# Patient Record
Sex: Female | Born: 1935 | Race: White | Hispanic: No | Smoking: Never smoker
Health system: Southern US, Community
[De-identification: ages and names within clinical notes are randomized; demographics above are authoritative.]

## PROBLEM LIST (undated history)

## (undated) DIAGNOSIS — C801 Malignant (primary) neoplasm, unspecified: Secondary | ICD-10-CM

## (undated) DIAGNOSIS — N189 Chronic kidney disease, unspecified: Secondary | ICD-10-CM

## (undated) DIAGNOSIS — T7840XA Allergy, unspecified, initial encounter: Secondary | ICD-10-CM

## (undated) DIAGNOSIS — F0390 Unspecified dementia without behavioral disturbance: Secondary | ICD-10-CM

## (undated) DIAGNOSIS — H269 Unspecified cataract: Secondary | ICD-10-CM

## (undated) DIAGNOSIS — M1990 Unspecified osteoarthritis, unspecified site: Secondary | ICD-10-CM

## (undated) DIAGNOSIS — C7982 Secondary malignant neoplasm of genital organs: Secondary | ICD-10-CM

## (undated) DIAGNOSIS — R42 Dizziness and giddiness: Secondary | ICD-10-CM

## (undated) DIAGNOSIS — K625 Hemorrhage of anus and rectum: Secondary | ICD-10-CM

## (undated) DIAGNOSIS — I959 Hypotension, unspecified: Secondary | ICD-10-CM

## (undated) DIAGNOSIS — I739 Peripheral vascular disease, unspecified: Secondary | ICD-10-CM

## (undated) DIAGNOSIS — J189 Pneumonia, unspecified organism: Secondary | ICD-10-CM

## (undated) DIAGNOSIS — I509 Heart failure, unspecified: Secondary | ICD-10-CM

## (undated) DIAGNOSIS — I499 Cardiac arrhythmia, unspecified: Secondary | ICD-10-CM

## (undated) DIAGNOSIS — J302 Other seasonal allergic rhinitis: Secondary | ICD-10-CM

## (undated) DIAGNOSIS — R0600 Dyspnea, unspecified: Secondary | ICD-10-CM

## (undated) DIAGNOSIS — N390 Urinary tract infection, site not specified: Secondary | ICD-10-CM

## (undated) DIAGNOSIS — F419 Anxiety disorder, unspecified: Secondary | ICD-10-CM

## (undated) DIAGNOSIS — I10 Essential (primary) hypertension: Secondary | ICD-10-CM

## (undated) DIAGNOSIS — Z923 Personal history of irradiation: Secondary | ICD-10-CM

## (undated) DIAGNOSIS — J449 Chronic obstructive pulmonary disease, unspecified: Secondary | ICD-10-CM

## (undated) HISTORY — DX: Anxiety disorder, unspecified: F41.9

## (undated) HISTORY — PX: APPENDECTOMY: SHX54

## (undated) HISTORY — PX: TONSILLECTOMY: SUR1361

## (undated) HISTORY — PX: ABDOMINAL HYSTERECTOMY: SHX81

## (undated) HISTORY — PX: NECK SURGERY: SHX720

## (undated) HISTORY — DX: Allergy, unspecified, initial encounter: T78.40XA

## (undated) HISTORY — PX: EYE SURGERY: SHX253

## (undated) HISTORY — DX: Unspecified cataract: H26.9

## (undated) HISTORY — PX: NASAL SEPTUM SURGERY: SHX37

## (undated) HISTORY — DX: Malignant (primary) neoplasm, unspecified: C80.1

---

## 1997-11-23 ENCOUNTER — Other Ambulatory Visit: Admission: RE | Admit: 1997-11-23 | Discharge: 1997-11-23 | Payer: Self-pay | Admitting: Family Medicine

## 2001-03-30 ENCOUNTER — Other Ambulatory Visit: Admission: RE | Admit: 2001-03-30 | Discharge: 2001-03-30 | Payer: Self-pay | Admitting: Family Medicine

## 2001-06-18 ENCOUNTER — Encounter (INDEPENDENT_AMBULATORY_CARE_PROVIDER_SITE_OTHER): Payer: Self-pay

## 2001-06-18 ENCOUNTER — Ambulatory Visit (HOSPITAL_COMMUNITY): Admission: RE | Admit: 2001-06-18 | Discharge: 2001-06-18 | Payer: Self-pay | Admitting: *Deleted

## 2007-10-27 ENCOUNTER — Encounter: Admission: RE | Admit: 2007-10-27 | Discharge: 2007-10-27 | Payer: Self-pay | Admitting: Internal Medicine

## 2007-11-24 ENCOUNTER — Ambulatory Visit: Admission: RE | Admit: 2007-11-24 | Discharge: 2007-11-24 | Payer: Self-pay | Admitting: Gynecologic Oncology

## 2007-12-02 ENCOUNTER — Ambulatory Visit (HOSPITAL_COMMUNITY): Admission: RE | Admit: 2007-12-02 | Discharge: 2007-12-02 | Payer: Self-pay | Admitting: Urology

## 2007-12-21 ENCOUNTER — Inpatient Hospital Stay (HOSPITAL_COMMUNITY): Admission: RE | Admit: 2007-12-21 | Discharge: 2007-12-24 | Payer: Self-pay | Admitting: Obstetrics & Gynecology

## 2007-12-21 ENCOUNTER — Encounter: Payer: Self-pay | Admitting: Gynecology

## 2008-02-04 ENCOUNTER — Ambulatory Visit: Admission: RE | Admit: 2008-02-04 | Discharge: 2008-02-04 | Payer: Self-pay | Admitting: Gynecology

## 2008-02-15 ENCOUNTER — Ambulatory Visit: Admission: RE | Admit: 2008-02-15 | Discharge: 2008-04-22 | Payer: Self-pay | Admitting: Radiation Oncology

## 2008-06-22 ENCOUNTER — Ambulatory Visit (HOSPITAL_COMMUNITY): Admission: RE | Admit: 2008-06-22 | Discharge: 2008-06-22 | Payer: Self-pay | Admitting: Urology

## 2008-07-21 ENCOUNTER — Ambulatory Visit: Admission: RE | Admit: 2008-07-21 | Discharge: 2008-07-21 | Payer: Self-pay | Admitting: Gynecology

## 2008-07-21 ENCOUNTER — Encounter: Payer: Self-pay | Admitting: Gynecology

## 2008-07-21 ENCOUNTER — Other Ambulatory Visit: Admission: RE | Admit: 2008-07-21 | Discharge: 2008-07-21 | Payer: Self-pay | Admitting: Gynecology

## 2008-09-27 ENCOUNTER — Ambulatory Visit: Admission: RE | Admit: 2008-09-27 | Discharge: 2008-09-27 | Payer: Self-pay | Admitting: Radiation Oncology

## 2008-09-27 ENCOUNTER — Other Ambulatory Visit: Admission: RE | Admit: 2008-09-27 | Discharge: 2008-09-27 | Payer: Self-pay | Admitting: Radiation Oncology

## 2008-09-27 ENCOUNTER — Encounter: Payer: Self-pay | Admitting: Radiation Oncology

## 2009-01-05 ENCOUNTER — Ambulatory Visit: Admission: RE | Admit: 2009-01-05 | Discharge: 2009-01-05 | Payer: Self-pay | Admitting: Gynecology

## 2009-01-05 ENCOUNTER — Encounter: Payer: Self-pay | Admitting: Gynecology

## 2009-01-05 ENCOUNTER — Other Ambulatory Visit: Admission: RE | Admit: 2009-01-05 | Discharge: 2009-01-05 | Payer: Self-pay | Admitting: Gynecology

## 2009-04-24 ENCOUNTER — Other Ambulatory Visit: Admission: RE | Admit: 2009-04-24 | Discharge: 2009-04-24 | Payer: Self-pay | Admitting: Radiation Oncology

## 2009-04-24 ENCOUNTER — Ambulatory Visit: Admission: RE | Admit: 2009-04-24 | Discharge: 2009-04-24 | Payer: Self-pay | Admitting: Radiation Oncology

## 2009-04-25 ENCOUNTER — Other Ambulatory Visit: Admission: RE | Admit: 2009-04-25 | Discharge: 2009-04-25 | Payer: Self-pay | Admitting: Radiology

## 2009-06-25 ENCOUNTER — Ambulatory Visit (HOSPITAL_COMMUNITY): Admission: RE | Admit: 2009-06-25 | Discharge: 2009-06-25 | Payer: Self-pay | Admitting: Urology

## 2009-08-22 ENCOUNTER — Ambulatory Visit: Admission: RE | Admit: 2009-08-22 | Discharge: 2009-08-22 | Payer: Self-pay | Admitting: Gynecology

## 2009-08-22 ENCOUNTER — Other Ambulatory Visit: Admission: RE | Admit: 2009-08-22 | Discharge: 2009-08-22 | Payer: Self-pay | Admitting: Gynecology

## 2010-01-14 ENCOUNTER — Ambulatory Visit: Admission: RE | Admit: 2010-01-14 | Discharge: 2010-01-14 | Payer: Self-pay | Admitting: Radiation Oncology

## 2010-01-14 ENCOUNTER — Other Ambulatory Visit: Admission: RE | Admit: 2010-01-14 | Discharge: 2010-01-14 | Payer: Self-pay | Admitting: Gynecology

## 2010-06-21 ENCOUNTER — Ambulatory Visit
Admission: RE | Admit: 2010-06-21 | Discharge: 2010-06-21 | Payer: Self-pay | Source: Home / Self Care | Attending: Gynecology | Admitting: Gynecology

## 2010-06-21 ENCOUNTER — Other Ambulatory Visit
Admission: RE | Admit: 2010-06-21 | Discharge: 2010-06-21 | Payer: Self-pay | Source: Home / Self Care | Admitting: Gynecology

## 2010-06-30 ENCOUNTER — Encounter: Payer: Self-pay | Admitting: Urology

## 2010-10-22 NOTE — Consult Note (Signed)
Debbie [name], Debbie [name]             ACCOUNT NO.:  [id number]   MEDICAL RECORD NO.:  [id number]          PATIENT TYPE:  OUT   LOCATION:  GYN                          FACILITY:  [facility]   PHYSICIAN:  Paola A. Duard Brady, MD    DATE OF BIRTH:  08/05/1930   DATE OF CONSULTATION:  11/24/2007  DATE OF DISCHARGE:                                 CONSULTATION   REFERRING PHYSICIAN:  Osborn Coho, M.D.   The patient is seen today in consultation at the request of Dr. Su Hilt.  Ms. Lecker is a 77 year old gravida 2, para 2 who has been spotting  starting a few months ago.  She had a kidney infection and was seen by  Dr. Brunilda Payor as she thought the bleeding was coming from her bladder or  kidney.  He saw her and felt that she had vaginal bleeding and asked her  to see her gynecologist.  She saw Dr. Su Hilt on May 29.  Endometrial  biopsy was done in the office at that time which revealed a grade 2  endometrioid adenocarcinoma.  She subsequently was referred to [place].  Since the biopsy she has had no bleeding.  There was no associated pain  with this.   REVIEW OF SYSTEMS:  She denies any chest pain, shortness of breath,  nausea, vomiting, fever or chills, headache or visual changes.  She  walks a mile per day.  She is able to easily walk a flight of stairs.   PAST MEDICAL HISTORY:  Seasonal allergies, diabetes, hypertension.   MEDICATIONS:  1. Metformin 1000 mg twice daily.  2. Glimepiride 4 mg daily.  3. Vytorin 10/80 one daily.  4. Lexapro 10 mg daily.  5. Lisinopril 20/25 one daily.   ALLERGIES:  CODEINE which causes her mouth to feel funny.   PAST SURGICAL HISTORY:  Appendectomy at the age of 77, tonsillectomy at  the age of 77.  She had a deviated septum repair.  She has had two  spontaneous vaginal deliveries.   SOCIAL HISTORY:  She denies use of tobacco or alcohol.  She is married.   FAMILY HISTORY:  Her sister had lung cancer.  She was a smoker.  She  died of a deep venous thrombosis.   Her mother had either endometrial or  cervical cancer.  She underwent surgery and did not require any adjuvant  therapy; that was many years ago and she was treated at [facility].  Her father had congestive heart failure.  She has two boys who are alive  and well.   HEALTH MAINTENANCE:  She had a mammogram in October of 2008 that was  negative.  She had a colonoscopy 5 years ago that was negative.   PHYSICAL EXAMINATION:  VITAL SIGNS:  Weight 204 pounds, height 5 feet 5  inches, blood pressure 116/68.  GENERAL:  A well-nourished, well-developed female in no acute distress.  NECK:  Supple.  There is no lymphadenopathy, no thyromegaly.  LUNGS:  Lungs were clear to auscultation bilaterally.  CARDIOVASCULAR:  Regular rate and rhythm.  ABDOMEN:  Abdomen is morbidly obese, soft, nontender, nondistended.  There are  no palpable masses or hepatosplenomegaly although exam is  limited by habitus.  Groins are negative for adenopathy.  EXTREMITIES:  No edema.  PELVIC:  External genitalia is within normal limits.  The vagina is  atrophic.  The cervix is visualized.  It is multiparous.  There is a  physiologic discharge.  There are no visible lesions.  Bimanual  examination - cervix is palpably normal.  The corpus does not appear  significantly enlarged.  It is not globular.  It is midplane and mobile.  RECTAL:  Confirms.   ASSESSMENT:  A 77 year old with a grade 2 endometrioid adenocarcinoma.  I had a lengthy discussion that she really is on the cusp of what we can  try laparoscopically.  I do think it is reasonable to start  laparoscopically.  She is a diabetic with hypertension and I do think  she would benefit from a minimally invasive procedure.  She does have a  small uterus measuring 8 x 5 x 4 cm on ultrasound with normal adnexa and  capacious vagina.  While she has centripetal obesity it is well  distributed and she is a nonsmoker.  She understands that there is a  significant risk of  her undergoing a laparotomy and she is amenable to  that.  We are tentatively scheduling her for surgery here in [place]  on July 14 and we will attempt to schedule it with Dr. Su Hilt.  I am  not here that day.  Dr. Kyla Balzarine my partner is and she understands that he  is adept at laparoscopic surgery and if he could not attempt it  laparoscopically he will proceed with a midline vertical incision at  that time.  She was offered at the opportunity for [place] to look at earlier  dates at [facility] but she states she preferred to keep the surgery  date here in [place] and do not look for dates in [place].  Risks  of surgery including but not limited to need for laparotomy, bleeding,  injury to surrounding organs, infection, thromboembolic disease,  pneumonia, lymphedema were discussed with the patient.  She understands  and wishes to proceed with surgery.  She is seeing Dr. Burton Apley,  her primary physician, next week to have some benign skin lesions  removed from her face and she will address that she is having surgery  with him.  I do not believe she needs any perioperative risk clearance  at this point based on her exercise tolerance but will defer to Dr.  Budd Palmer opinion.      Paola A. Duard Brady, MD  Electronically Signed     PAG/MEDQ  D:  11/24/2007  T:  11/24/2007  Job:  [id number]   cc:   Lindaann Slough, M.D.  Fax: [phone]   Osborn Coho, M.D.  Fax: [phone]   Antony Madura, M.D.  Fax: [phone]   Telford Nab, R.N.  [address]  [place], [place] [address]

## 2010-10-22 NOTE — Op Note (Signed)
Debbie [name], Debbie [name]             ACCOUNT NO.:  [id number]   MEDICAL RECORD NO.:  [id number]          PATIENT TYPE:  INP   LOCATION:  0005                         FACILITY:  [facility]   PHYSICIAN:  De Blanch, M.D.DATE OF BIRTH:  08/27/1930   DATE OF PROCEDURE:  12/21/2007  DATE OF DISCHARGE:                               OPERATIVE REPORT   PREOPERATIVE DIAGNOSES:  Grade II endometrial carcinoma.   POSTOPERATIVE DIAGNOSES:  Grade II endometrial carcinoma, right ovarian  serous cystadenoma.   PROCEDURE:  exploratory laparotomy, total abdominal hysterectomy,  bilateral salpingo-oophorectomy, pelvic and periaortic lymphadenectomy.   SURGEON:  De Blanch, M.D.   ASSISTANT:  Roseanna Rainbow, M.D.  Telford Nab, R.N.   ANESTHESIA:  general endotracheal anesthesia.   ESTIMATED BLOOD LOSS:  500 mL.   SURGICAL FINDINGS:  At the time of exploratory laparotomy, the patient  had a smooth walled 3 to 4 cm cyst arising from the right ovary.  On  frozen section this was benign.  The uterus, tubes and ovaries were  otherwise normal.  The patient had a considerable amount of fat in the  retroperitoneal space.  No lymph nodes were obviously suspicious.  By  frozen section the uterus had invasion to the middle third of the  myometrium and evidence of lymph vascular space invasion deeper in the  myometrium.  There were a few omental adhesions in the pelvis.  The  upper abdomen including liver, diaphragms and omentum were normal.   DESCRIPTION OF PROCEDURE:  The patient was brought to the operating room  and after satisfactory obtainment of general anesthesia, was placed in a  modified lithotomy position in [place] stirrups.  The anterior abdominal  wall, perineum and vagina were prepped with Betadine.  Foley catheter  was inserted, the patient was draped and the abdomen was entered through  a midline incision.  Peritoneal washing were obtained from the pelvis  and  upper abdomen.  The pelvis was explored with the above noted  findings.  Bookwalter retractor was assembled and the small bowel packed  out of the pelvis.  The uterus was grasped with Tresa Endo clamps.  The round  ligaments were divided with cautery and the retroperitoneal space was  opened, developing the perirectal and perivesical spaces.  The ureter  and pelvic side wall vessels were identified.  The ovarian vessels were  skeletonized, clamped, cut, free tied and suture ligated with 2-0 Vicryl  suture.  The right adnexa was removed and sent for frozen section with  the above noted findings.  Bladder flap was advanced through sharp and  blunt dissection.  The uterine vessels were skeletonized, clamped, cut  and suture ligated in a stepwise fashion.  Cervical and cardinal  ligaments were clamped, cut and suture ligated.  Vaginal angles were  cross clamped and the vagina transected at its junction with the cervix.  Vaginal angles were transfixed with 0 Vicryl.  The central portion of  the vagina closed with interrupted figure-of-eight sutures of 0 Vicryl.   Attention was turned to performing pelvic lymphadenectomy.  This was  somewhat complicated by the patient's extreme obesity, especially  excessive amount of fat in the retroperitoneal space.  None the less,  external iliac artery and vein were stripped of its lymph node bearing  tissue.  The dissection continued down into the obturator fossa where  the obturator artery and vein were identified along with the obturator  nerve.  The lymph nodes from the obturator fossa were removed.  Throughout the dissection hemostasis was achieved with cautery and  hemoclips.  Similar procedure was performed on both sides of the pelvis.   The retractor was repositioned and the upper abdomen explored.  The  midline incision was extended so as to expose the aorta.  The small  bowel was packed to the right.  Peritoneal incision was made overlying  the right  common iliac artery and along the aorta.  Dissection was  carried beneath the retroperitoneal portion of the duodenum which was  mobilized cephalad.  The right ureter was mobilized laterally and held  behind the retractor for protection.  After exposure was gained, the  lymph nodes overlying the right common iliac artery and vein, vena cava  and aorta were removed up to the level of the insertion of the ovarian  artery and vein on the right.  Hemostasis was again achieved with  hemoclips and cautery.  This area was inspected and found to be  hemostatic.  The pelvis was re-exposed and explored.  Hemostasis was  achieved with cautery and an additional suture in the vaginal cuff.  The  pelvis was irrigated with saline.  Packs and retractors were removed.  The omentum was brought down to cover the small bowel.  The anterior  abdominal wall was closed in layers, the first being a running mass  closure using #1 PDS.  On the lower portion of the pole Seprafilm was  placed to minimize adhesions to the lower abdominal incision.  In  addition, prior to closure Seprafilm was placed across the raw pelvic  peritoneum.  Subcutaneous tissue was irrigated.  Hemostasis was achieved  with cautery.  The skin was closed with staples.  A dressing was  applied.  The patient was awakened from anesthesia and taken to the  recovery room in satisfactory condition.  Sponge, needle and instrument  counts correct x2.      De Blanch, M.D.  Electronically Signed     DC/MEDQ  D:  12/21/2007  T:  12/21/2007  Job:  [id number]   cc:   Telford Nab, R.N.  [address]  [place], [place] [address]   Osborn Coho, M.D.  Fax: [phone]   Antony Madura, M.D.  Fax: [phone]   Lindaann Slough, M.D.  Fax: [phone]   Roseanna Rainbow, M.D.  Fax: [phone]

## 2010-10-22 NOTE — Consult Note (Signed)
Debbie [name], Debbie [name]             ACCOUNT NO.:  [id number]   MEDICAL RECORD NO.:  [id number]          PATIENT TYPE:  OUT   LOCATION:  GYN                          FACILITY:  [facility]   PHYSICIAN:  De Blanch, M.D.DATE OF BIRTH:  Mar 13, 1931   DATE OF CONSULTATION:  01/05/2009  DATE OF DISCHARGE:  01/05/2009                                 CONSULTATION   CHIEF COMPLAINT:  Endometrial cancer.   INTERVAL HISTORY:  The patient returns today for continuing followup of  a stage IB grade 3 adenocarcinoma of the endometrium.  Since her last  visit she has done well.  She denies any GI or GU symptoms, has no  pelvic pain, pressure, vaginal bleeding, or discharge.   HISTORY OF PRESENT ILLNESS:  On December 21, 2007, the patient underwent  total abdominal hysterectomy, bilateral salpingo-oophorectomy, pelvic  and periaortic lymphadenectomy for a poorly differentiated endometrial  carcinoma, which was found to be confined to the inner half of the  myometrium (stage IB).  She received postoperative high-dose rate  brachytherapy to the vaginal vault. She has had no postoperative or post-  radiation complications.   PAST MEDICAL HISTORY:  1. Hypertension.  2. Diabetes.  3. Obesity.  4. Seasonal allergies.   CURRENT MEDICATIONS:  Metformin, glyburide, Vytorin, Lexapro, and  lisinopril.   PAST SURGICAL HISTORY:  1. TAH-BSO, pelvic and periaortic lymphadenectomy in 2009.  2. Appendectomy at age 77.  3. Tonsils and adenoidectomy at age 77.  4. Repair of deviated nasal septum.   OBSTETRICAL HISTORY:  Gravida 2.   SOCIAL HISTORY:  The patient does not smoke.   FAMILY HISTORY:  Mother with endometrial and cervical cancer.   REVIEW OF SYSTEMS:  1 10-point comprehensive review of systems is  negative except as noted above.   PHYSICAL EXAMINATION:  VITAL SIGNS:  Weight 197 pounds, blood pressure  134/78.  GENERAL:  The patient is a healthy white female in no acute distress.  HEENT:   Negative.  NECK:  Supple without thyromegaly.  There is no supraclavicular or  inguinal adenopathy.  ABDOMEN:  Obese, soft, nontender.  No mass, organomegaly, ascites, or  hernias are noted.  PELVIC EXAM:  EG, BUS, vagina, bladder, and urethra are normal.  Cervix  and uterus surgically absent.  Adnexa without masses.  Rectovaginal exam  confirms.  LOWER EXTREMITIES:  Without edema or varicosities.   IMPRESSION:  Stage IB grade 3 endometrial cancer.  No evidence of  recurrent disease.   PLAN:  Pap smears were obtained.  The patient will obtain mammograms in  the near future.  She will follow up with Dr. Roselind Messier in 4 months and  return to see [place] in 8 months.      De Blanch, M.D.  Electronically Signed     DC/MEDQ  D:  01/12/2009  T:  01/12/2009  Job:  [id number]   cc:   Telford Nab, R.N.  [address]  [place], [place] [address]   Billie Lade, M.D.  Fax: [phone]   Osborn Coho, M.D.  Fax: [phone]   Lindaann Slough, M.D.  Fax: [phone]

## 2010-10-22 NOTE — Consult Note (Signed)
[name], [name]             ACCOUNT NO.:  [id number]   MEDICAL RECORD NO.:  [id number]          PATIENT TYPE:  OUT   LOCATION:  GYN                          FACILITY:  [facility]   PHYSICIAN:  De Blanch, M.D.DATE OF BIRTH:  1931-03-27   DATE OF CONSULTATION:  DATE OF DISCHARGE:                                 CONSULTATION   CHIEF COMPLAINT:  Endometrial cancer.   INTERVAL HISTORY:  The patient turns today for continuing follow-up.  She was last seen by Dr. Roselind Messier in November.  She was treated with  postoperative vaginal vault, high-dose rate brachytherapy in October.  Since then she has done well.  She denies any vaginal bleeding,  discharge, any pelvic pain, and has no urinary tract symptoms.   HISTORY OF PRESENT ILLNESS:  The patient underwent total abdominal  hysterectomy, bilateral salpingo-oophorectomy, and pelvic and  periaortic lymphadenectomy December 21, 2007.  Final pathology showed a  poorly differentiated endometrial carcinoma confined to the inner half  of the myometrium.  She was treated postoperatively with a high-dose  rate brachytherapy with the vaginal vault.   PAST MEDICAL HISTORY:  1. Diabetes.  2. Hypertension.  3. Obesity.  4. Seasonal allergies.   CURRENT MEDICATIONS:  Metformin, glyburide, Vytorin, Lexapro, and  lisinopril.   PAST SURGICAL HISTORY:  1. TAH-BSO.  2. Pelvic and periaortic lymphadenectomy 2009.  3. Appendectomy at age 48.  4. Tonsil and adenoidectomy at age 56.  5. Repair of deviated nasal septum.   OBSTETRICAL HISTORY:  Gravida 2.   SOCIAL HISTORY:  The patient does not smoke.   FAMILY HISTORY:  The patient's mother had endometrial or cervical  cancer.  No other gynecologic breast or colon cancers noted in the  family history.   The patient has annual mammograms ordered by her primary physician.   REVIEW OF SYSTEMS:  A 10-point comprehensive review of systems negative  except as noted above.   PHYSICAL EXAMINATION:   Weight of 195 pounds, blood pressure 150/76,  pulse 88.  GENERAL:  The patient is a healthy white female in no acute distress.  HEENT:  Negative.  NECK:  Supple without thyromegaly.  There is no supraclavicular or  inguinal adenopathy.  ABDOMEN:  Soft, nontender.  No mass, organomegaly, ascites or hernias  are noted.  Midline incision is well-healed.  PELVIC:  EG/BUS, vagina, bladder, urethra are normal.  Vaginal vault is  healthy.  No lesions are noted.  Pap smears were obtained.  Bimanual  rectovaginal exam revealed no mass, induration or nodularity.   IMPRESSION:  Stage IB, grade III endometrial carcinoma clinically free  of disease.  Pap smears were obtained.  The patient will return to see  Dr. Roselind Messier in 3 months, and return to see me 3 months thereafter.      De Blanch, M.D.  Electronically Signed     DC/MEDQ  D:  07/21/2008  T:  07/21/2008  Job:  [id number]   cc:   Telford Nab, R.N.  [address]  [place], [place] [address]   Billie Lade, M.D.  Fax: [phone]   Osborn Coho, M.D.  Fax:  [phone] 

## 2010-10-22 NOTE — Consult Note (Signed)
NAMEJACKLYN, Debbie [name]             ACCOUNT NO.:  [id number]   MEDICAL RECORD NO.:  [id number]          PATIENT TYPE:  OUT   LOCATION:  GYN                          FACILITY:  [facility]   PHYSICIAN:  De Blanch, M.D.DATE OF BIRTH:  12-Nov-1930   DATE OF CONSULTATION:  DATE OF DISCHARGE:                                 CONSULTATION   CHIEF COMPLAINT:  Endometrial cancer, postoperative follow-up, treatment  planning.   INTERVAL HISTORY:  The patient underwent TAH-BSO, pelvic and periaortic  lymphadenectomy on July 14. She had an uncomplicated postoperative  course.  She reports that she is doing well.  She denies any GI or GU  symptoms.  She has no significant abdominal pain.  Her functional status  has nearly completely returned to normal.   PHYSICAL EXAMINATION:  VITAL SIGNS:  weight 195 pounds.  GENERAL:  Patient is a healthy white female in no acute distress.  HEENT is negative.  NECK:  Supple without thyromegaly.  There is no supraclavicular or  inguinal adenopathy.  ABDOMEN:  Abdomen is obese, soft, nontender.  No mass, organomegaly,  ascites or hernias noted.  Midline incision is well-healed.  PELVIC  EXAM:  EG BUS, vagina, urethra are normal.  Cervix and uterus surgically  absent.  Adnexa without masses.  Rectovaginal exam confirms.  LOWER EXTREMITIES:  Without edema or varicosities.   IMPRESSION:  The patient's pathology is reviewed.  She has a stage IB  grade 3 endometrial carcinoma, 16 pelvic and periaortic lymph nodes are  negative as are pelvic washings.  I discussed his pathology report with  the patient, and we have recommend she undergo postoperative vaginal  vault brachytherapy. We will refer her to Dr. Roselind Messier.   She will return to see me in 3 months for continuing followup.  She is  given date of return to full levels of activity.      De Blanch, M.D.  Electronically Signed     DC/MEDQ  D:  02/04/2008  T:  02/05/2008  Job:  [id number]   cc:   Telford Nab, R.N.  [address]  [place], [place] [address]   Lindaann Slough, M.D.  Fax: [phone]   Osborn Coho, M.D.  Fax: [phone]   Antony Madura, M.D.  Fax: [phone]

## 2010-10-25 NOTE — Discharge Summary (Signed)
[name], Debbie [name]             ACCOUNT NO.:  [id number]   MEDICAL RECORD NO.:  [id number]          PATIENT TYPE:  INP   LOCATION:  1531                         FACILITY:  [facility]   PHYSICIAN:  Roseanna Rainbow, M.D.DATE OF BIRTH:  [birth year]   DATE OF ADMISSION:  12/21/2007  DATE OF DISCHARGE:  12/24/2007                               DISCHARGE SUMMARY   CHIEF COMPLAINTS:  The patient is a 77 year old with a newly diagnosed  grade II endometrioid adenocarcinoma of the uterus who presents for  surgical management.  Please see the dictated history and physical for  further details.   HOSPITAL COURSE:  The patient was admitted and underwent a total  abdominal hysterectomy, bilateral salpingo-oophorectomy, pelvic and  periaortic lymphadenectomy.  Please see the dictated operative summary.  On postoperative day one her H&H were stable.  She was doing well.  Her  diabetic medications were restarted.  She was started on a clear-liquid  diet.  Her diet was gradually advanced.  The remainder of her hospital  course was uneventful.  She was discharged to home on postoperative day  three, tolerating a regular diet.   DISCHARGE DIAGNOSIS:  Stage IB endometrioid carcinoma, FIGO grade 3.   PROCEDURES:  Total abdominal hysterectomy, bilateral salpingo-  oophorectomy, pelvic and periaortic lymphadenectomy.   CONDITION:  Good.   DIET:  ADA diet.   MEDICATIONS:  Resume preoperative medications, Percocet.   DISPOSITION:  The patient was to follow up on July 22 between 10:00 a.m.  and 12 noon at the cancer center.      Roseanna Rainbow, M.D.  Electronically Signed     LAJ/MEDQ  D:  01/11/2008  T:  01/11/2008  Job:  [id number]   cc:   Lindaann Slough, M.D.  Fax: [phone]   Osborn Coho, M.D.  Fax: [phone]   Antony Madura, M.D.  Fax: [phone]   Telford Nab, R.N.  [address]  [place], [place] [address]

## 2011-01-07 ENCOUNTER — Other Ambulatory Visit (HOSPITAL_COMMUNITY)
Admission: RE | Admit: 2011-01-07 | Discharge: 2011-01-07 | Disposition: A | Discharge status: Home / Self Care | Payer: PRIVATE HEALTH INSURANCE | Source: Ambulatory Visit | Attending: Radiation Oncology | Admitting: Radiation Oncology

## 2011-01-07 ENCOUNTER — Other Ambulatory Visit: Payer: Self-pay | Admitting: Radiation Oncology

## 2011-01-07 ENCOUNTER — Ambulatory Visit
Admission: RE | Admit: 2011-01-07 | Discharge: 2011-01-07 | Disposition: A | Discharge status: Home / Self Care | Payer: PRIVATE HEALTH INSURANCE | Source: Ambulatory Visit | Attending: Radiation Oncology | Admitting: Radiation Oncology

## 2011-01-07 DIAGNOSIS — C549 Malignant neoplasm of corpus uteri, unspecified: Secondary | ICD-10-CM | POA: Insufficient documentation

## 2011-03-06 LAB — COMPREHENSIVE METABOLIC PANEL
AST: 43 — ABNORMAL HIGH
BUN: 26 — ABNORMAL HIGH
GFR calc Af Amer: >60
Glucose, Bld: 161 — ABNORMAL HIGH
Potassium: 5
Sodium: 141
Total Bilirubin: 0.9

## 2011-03-06 LAB — DIFFERENTIAL
Basophils Absolute: 0
Basophils Relative: 0
Eosinophils Absolute: 0.2
Eosinophils Relative: 1
Lymphocytes Relative: 18
Monocytes Absolute: 0.7
Monocytes Relative: 6
Neutro Abs: 8.2 — ABNORMAL HIGH
Neutrophils Relative %: 74

## 2011-03-06 LAB — CBC
HCT: 37.7
Hemoglobin: 12.7
Platelets: 330
RBC: 4.17

## 2011-03-07 LAB — GLUCOSE, CAPILLARY
Glucose-Capillary: 156 — ABNORMAL HIGH
Glucose-Capillary: 169 — ABNORMAL HIGH
Glucose-Capillary: 42 — ABNORMAL LOW
Glucose-Capillary: 83
Glucose-Capillary: 91

## 2011-03-07 LAB — BASIC METABOLIC PANEL
Calcium: 8.5
Creatinine, Ser: 1
Glucose, Bld: 204 — ABNORMAL HIGH
Sodium: 138

## 2011-03-07 LAB — CBC
HCT: 32.1 — ABNORMAL LOW
Hemoglobin: 11 — ABNORMAL LOW
MCV: 90.7
Platelets: 349
RBC: 3.53 — ABNORMAL LOW
RDW: 14.6
WBC: 9.7

## 2011-03-07 LAB — HEMOGLOBIN AND HEMATOCRIT, BLOOD
HCT: 33.4 — ABNORMAL LOW
Hemoglobin: 11.1 — ABNORMAL LOW

## 2011-07-07 ENCOUNTER — Encounter: Payer: Self-pay | Admitting: Radiation Oncology

## 2011-07-07 ENCOUNTER — Other Ambulatory Visit (HOSPITAL_COMMUNITY)
Admission: RE | Admit: 2011-07-07 | Discharge: 2011-07-07 | Disposition: A | Discharge status: Home / Self Care | Payer: PRIVATE HEALTH INSURANCE | Source: Ambulatory Visit | Attending: Radiation Oncology | Admitting: Radiation Oncology

## 2011-07-07 ENCOUNTER — Ambulatory Visit
Admission: RE | Admit: 2011-07-07 | Discharge: 2011-07-07 | Disposition: A | Discharge status: Home / Self Care | Payer: Medicare Other | Source: Ambulatory Visit | Attending: Radiation Oncology | Admitting: Radiation Oncology

## 2011-07-07 DIAGNOSIS — C549 Malignant neoplasm of corpus uteri, unspecified: Secondary | ICD-10-CM | POA: Clinically undetermined

## 2011-07-07 DIAGNOSIS — Z01419 Encounter for gynecological examination (general) (routine) without abnormal findings: Secondary | ICD-10-CM | POA: Insufficient documentation

## 2011-07-07 NOTE — Progress Notes (Signed)
Here for follow up post radiation endometroid ca. Denies bowel and bladder problems. Mammogram this month was negative. I will call patient at home to get correct medications and dosages.

## 2011-07-07 NOTE — Progress Notes (Signed)
CC:   De Blanch, M.D. Antony Madura, M.D.  DIAGNOSIS:  Stage I-B grade 3 endometrioid adenocarcinoma.  INTERVAL SINCE RADIATION THERAPY:  3 years and 3 months.  NARRATIVE:  Debbie [name] comes in today for routine followup.  She clinically seems to be doing well.  The patient denies any new medical problems or new medications since last visit.  She denies any vaginal bleeding, urination difficulties or bowel complaints.  The patient denies any rectal bleeding.  The patient denies any pelvic or flank pain.  PHYSICAL EXAMINATION:  The patient's temperature is 98.4, pulse 59, blood pressure 143/77, weight is 202.4 pounds.  Examination of the neck and supraclavicular region reveals no evidence of adenopathy.  The axillary areas are free of adenopathy.  Examination of the lungs reveals them to be clear.  The heart has a regular rhythm and rate.  Examination of the abdomen reveals it to be soft and nontender with normal bowel sounds.  The inguinal areas are free of adenopathy.  On pelvic examination, the external genitalia are unremarkable.  A speculum exam is performed.  There are mild radiation changes noted at the vaginal cuff.  Pap smear is obtained of the proximal vagina.  On bimanual and rectovaginal examination, there are no pelvic masses appreciated.  IMPRESSION/PLAN:  Clinically NED (no evidence of disease), Pap smear pending.  The patient will return for followup in 6 months.    ______________________________ Debbie [name], Ph.D., M.D. JDK/MEDQ  D:  07/07/2011  T:  07/07/2011  Job:  [id number]

## 2011-07-10 ENCOUNTER — Telehealth: Payer: Self-pay

## 2011-07-10 NOTE — Telephone Encounter (Signed)
Pt. returned call for pap smear results. Informed negative for intraepithelial lesions/malignancy. Pt. Also read current med bottle prescriptions to me which were entered under medications.

## 2011-12-08 ENCOUNTER — Ambulatory Visit
Admission: RE | Admit: 2011-12-08 | Discharge: 2011-12-08 | Disposition: A | Discharge status: Home / Self Care | Payer: Medicare Other | Source: Ambulatory Visit | Attending: Radiation Oncology | Admitting: Radiation Oncology

## 2011-12-08 ENCOUNTER — Other Ambulatory Visit (HOSPITAL_COMMUNITY)
Admission: RE | Admit: 2011-12-08 | Discharge: 2011-12-08 | Disposition: A | Discharge status: Home / Self Care | Payer: Medicare Other | Source: Ambulatory Visit | Attending: Radiation Oncology | Admitting: Radiation Oncology

## 2011-12-08 ENCOUNTER — Encounter: Payer: Self-pay | Admitting: Radiation Oncology

## 2011-12-08 VITALS — BP 97/64 | HR 66 | Temp 98.2°F | Resp 20 | Wt 204.5 lb

## 2011-12-08 DIAGNOSIS — C549 Malignant neoplasm of corpus uteri, unspecified: Secondary | ICD-10-CM

## 2011-12-08 DIAGNOSIS — Z01419 Encounter for gynecological examination (general) (routine) without abnormal findings: Secondary | ICD-10-CM | POA: Insufficient documentation

## 2011-12-08 LAB — BASIC METABOLIC PANEL
BUN: 30 mg/dL — ABNORMAL HIGH (ref 6–23)
Potassium: 5.4 mEq/L — ABNORMAL HIGH (ref 3.5–5.3)
Sodium: 142 mEq/L (ref 135–145)

## 2011-12-08 LAB — CBC WITH DIFFERENTIAL/PLATELET
Basophils Absolute: 0.1 10*3/uL (ref 0.0–0.1)
EOS%: 2.6 % (ref 0.0–7.0)
MCH: 31.6 pg (ref 25.1–34.0)
MCHC: 33.4 g/dL (ref 31.5–36.0)
MCV: 94.5 fL (ref 79.5–101.0)
MONO%: 9.1 % (ref 0.0–14.0)
RBC: 4.02 10*6/uL (ref 3.70–5.45)
RDW: 15.2 % — ABNORMAL HIGH (ref 11.2–14.5)

## 2011-12-08 NOTE — Progress Notes (Signed)
Radiation Oncology         [phone] ________________________________  Name: Debbie [name] MRN: [id number]  Date: 12/08/2011  DOB: 10/23/1930  Follow-Up Visit Note  CC: No primary provider on file.  Clarke-Pearson, Daniel *  Diagnosis:   Stage IB grade 3 endometrial adenocarcinoma  Interval Since Last Radiation:  45 months  Narrative:  The patient returns today for routine follow-up.  Is doing well without a specific complaints. She has noticed some occasional swelling in the left lower quadrant. This does not cause any discomfort for the patient. I am unsure whether patient was seen in GYN oncology in January as originally scheduled.  She is using her vaginal dilator intermittently. She denies any vaginal bleeding hematuria or rectal bleeding. Patient denies any abdominal or pelvic pain.                              ALLERGIES:  is allergic to codeine.  Meds: Current Outpatient Prescriptions  Medication Sig Dispense Refill  . aspirin 81 MG tablet Take 81 mg by mouth daily.      [name] atenolol (TENORMIN) 100 MG tablet Take 100 mg by mouth daily.      . fluticasone (VERAMYST) 27.5 MCG/SPRAY nasal spray Place 2 sprays into the nose daily. 50 mcg      . glimepiride (AMARYL) 4 MG tablet Take 4 mg by mouth daily before breakfast.      . lisinopril (PRINIVIL,ZESTRIL) 20 MG tablet Take 20 mg by mouth daily.      . metFORMIN (GLUCOPHAGE) 1000 MG tablet Take 1,000 mg by mouth 2 (two) times daily with a meal.      . Multiple Vitamin (MULTIVITAMIN) tablet Take 1 tablet by mouth daily. Centrum silver      . simvastatin (ZOCOR) 80 MG tablet Take 80 mg by mouth at bedtime.      . calcium-vitamin D (OSCAL WITH D) 500-200 MG-UNIT per tablet Take 1 tablet by mouth 2 (two) times daily.      [name] escitalopram (LEXAPRO) 10 MG tablet Take 10 mg by mouth daily.      . fish oil-omega-3 fatty acids 1000 MG capsule Take 1 g by mouth 2 (two) times daily.        Physical Findings: The patient is in no acute  distress. Patient is alert and oriented.  weight is 204 lb 8 oz (92.761 kg). Her oral temperature is 98.2 F (36.8 C). Her blood pressure is 97/64 and her pulse is 66. Her respiration is 20. [name]  No palpable cervical or subclavicular adenopathy.  The abdomen is soft and nontender. There's no rebound or guarding noted.  mass versus hernia in the left lower quadrant which is reducible.  This is notice more on the patient goes from sitting to lying down. The right inguinal area is free of adenopathy. The left inguinal area shows a questionable 1.5 x 1.5 cm lymph node. On pelvic examination the external genitalia are unremarkable. Speculum exam is performed. There is mild radiation changes noted in the proximal vagina. A Pap smear was obtained of this area. On bimanual and rectovaginal examination there no pelvic masses appreciated however exam is somewhat compromised and in light of the patient's body habitus.  Lab Findings: Lab Results  Component Value Date   WBC 9.7 12/22/2007   HGB 11.1* 12/22/2007   HCT 33.4* 12/22/2007   MCV 90.7 12/22/2007   PLT 349 12/22/2007    @  LASTCHEM@  Radiographic Findings: No results found.  Impression:  Possible recurrence in the left inguinal area. As above the patient may have a mass in the left lower quadrant versus hernia. In light of  these issues the patient will undergo routine blood work as well as a CT scan of abdomen and pelvis for further evaluation.  Plan:  Routine followup in 6 months unless otherwise indicated in light of the exams above.  _____________________________________   Billie Lade, PhD, MD

## 2011-12-08 NOTE — Progress Notes (Signed)
Patient here f/u endometrial cancer rad txs=-9/21,09,03/06/08,03/13/08,& 03/20/08 Alert oriented x3,ambulatory,steady gait, no c/o pain or dysuria, normal bowel movements, there is a hard knot size baseball on lower left abdomen,"doesn't hurt" been there she thinks about a year Last pap smear 07/08/11,11:13 AM

## 2011-12-08 NOTE — Progress Notes (Signed)
Pap smear collected by Dr.Kinard and sent to lab,patient tolerated well 11:33 AM

## 2011-12-09 NOTE — Addendum Note (Signed)
Encounter addended by: Billie Lade, MD on: 12/09/2011  5:21 PM<BR>     Documentation filed: Orders

## 2011-12-10 ENCOUNTER — Telehealth: Payer: Self-pay | Admitting: *Deleted

## 2011-12-10 NOTE — Telephone Encounter (Signed)
CALLED PATIENT TO INFORM OF TEST. LVM FOR A RETURN CALL

## 2011-12-15 ENCOUNTER — Ambulatory Visit: Payer: PRIVATE HEALTH INSURANCE | Admitting: Radiation Oncology

## 2011-12-15 ENCOUNTER — Telehealth: Payer: Self-pay | Admitting: *Deleted

## 2011-12-15 ENCOUNTER — Telehealth: Payer: Self-pay | Admitting: Gynecologic Oncology

## 2011-12-15 NOTE — Telephone Encounter (Signed)
Debbie [name] informed that her PAP smear was negative for any signs of cancer. She expressed relief and thanks for the call.

## 2011-12-15 NOTE — Telephone Encounter (Signed)
Pt notified about pap results: negative.  No questions or concerns voiced. 

## 2011-12-17 ENCOUNTER — Encounter (HOSPITAL_COMMUNITY): Payer: Self-pay

## 2011-12-17 ENCOUNTER — Ambulatory Visit (HOSPITAL_COMMUNITY)
Admission: RE | Admit: 2011-12-17 | Discharge: 2011-12-17 | Disposition: A | Discharge status: Home / Self Care | Payer: Medicare Other | Source: Ambulatory Visit | Attending: Radiation Oncology | Admitting: Radiation Oncology

## 2011-12-17 DIAGNOSIS — K8020 Calculus of gallbladder without cholecystitis without obstruction: Secondary | ICD-10-CM | POA: Insufficient documentation

## 2011-12-17 DIAGNOSIS — K469 Unspecified abdominal hernia without obstruction or gangrene: Secondary | ICD-10-CM | POA: Insufficient documentation

## 2011-12-17 DIAGNOSIS — C549 Malignant neoplasm of corpus uteri, unspecified: Secondary | ICD-10-CM | POA: Insufficient documentation

## 2011-12-17 DIAGNOSIS — K7689 Other specified diseases of liver: Secondary | ICD-10-CM | POA: Insufficient documentation

## 2011-12-17 DIAGNOSIS — K429 Umbilical hernia without obstruction or gangrene: Secondary | ICD-10-CM | POA: Insufficient documentation

## 2011-12-17 DIAGNOSIS — M47817 Spondylosis without myelopathy or radiculopathy, lumbosacral region: Secondary | ICD-10-CM | POA: Insufficient documentation

## 2011-12-17 HISTORY — DX: Essential (primary) hypertension: I10

## 2012-06-11 ENCOUNTER — Encounter: Payer: Self-pay | Admitting: Radiation Oncology

## 2012-06-14 ENCOUNTER — Ambulatory Visit
Admission: RE | Admit: 2012-06-14 | Discharge: 2012-06-14 | Disposition: A | Discharge status: Home / Self Care | Payer: Medicare Other | Source: Ambulatory Visit | Attending: Radiation Oncology | Admitting: Radiation Oncology

## 2012-06-14 ENCOUNTER — Ambulatory Visit: Payer: Medicare Other | Admitting: Radiation Oncology

## 2012-06-14 ENCOUNTER — Other Ambulatory Visit (HOSPITAL_COMMUNITY)
Admission: RE | Admit: 2012-06-14 | Discharge: 2012-06-14 | Disposition: A | Discharge status: Home / Self Care | Payer: Medicare Other | Source: Ambulatory Visit | Attending: Radiation Oncology | Admitting: Radiation Oncology

## 2012-06-14 ENCOUNTER — Encounter: Payer: Self-pay | Admitting: Radiation Oncology

## 2012-06-14 VITALS — BP 188/64 | HR 74 | Temp 97.6°F | Resp 20 | Wt 201.9 lb

## 2012-06-14 DIAGNOSIS — Z01419 Encounter for gynecological examination (general) (routine) without abnormal findings: Secondary | ICD-10-CM | POA: Insufficient documentation

## 2012-06-14 DIAGNOSIS — C549 Malignant neoplasm of corpus uteri, unspecified: Secondary | ICD-10-CM

## 2012-06-14 HISTORY — DX: Personal history of irradiation: Z92.3

## 2012-06-14 NOTE — Progress Notes (Signed)
Radiation Oncology         [phone] ________________________________  Name: Debbie [name] MRN: [id number]  Date: 06/14/2012  DOB: 1930/12/17  Follow-Up Visit Note  CC: No primary provider on file.  Clarke-Pearson, Daniel *  Diagnosis:   Stage IB grade 3 endometrial adenocarcinoma  Interval Since Last Radiation:  4 years and 3 months   Narrative:  The patient returns today for routine follow-up.  She seems to be doing well without any new medical problems. patient was noted to have a possible mass in the left groin and left lower quadrant area on her last followup exam. CT scan showed a left infra-umbilical hernia without evidence of incarceration or collection. There was no lymphadenopathy noted on the patient's scan.  She denies any hematuria vaginal bleeding or rectal bleeding.                           ALLERGIES:  is allergic to codeine.  Meds: Current Outpatient Prescriptions  Medication Sig Dispense Refill  . aspirin 81 MG tablet Take 81 mg by mouth daily.      [name] atenolol (TENORMIN) 100 MG tablet Take 100 mg by mouth daily.      . calcium-vitamin D (OSCAL WITH D) 500-200 MG-UNIT per tablet Take 1 tablet by mouth 2 (two) times daily.      . fish oil-omega-3 fatty acids 1000 MG capsule Take 1 g by mouth 2 (two) times daily.      . fluticasone (VERAMYST) 27.5 MCG/SPRAY nasal spray Place 2 sprays into the nose as needed. 50 mcg      . glimepiride (AMARYL) 4 MG tablet Take 4 mg by mouth daily before breakfast.      . lisinopril (PRINIVIL,ZESTRIL) 20 MG tablet Take 20 mg by mouth daily.      . metFORMIN (GLUCOPHAGE) 1000 MG tablet Take 1,000 mg by mouth 2 (two) times daily with a meal.      . Multiple Vitamin (MULTIVITAMIN) tablet Take 1 tablet by mouth daily. Centrum silver      . simvastatin (ZOCOR) 80 MG tablet Take 80 mg by mouth at bedtime.        Physical Findings: The patient is in no acute distress. Patient is alert and oriented.  weight is 201 lb 14.4 oz (91.581  kg). Her oral temperature is 97.6 F (36.4 C). Her blood pressure is 188/64 and her pulse is 74. Her respiration is 20. [name]  No palpable cervical supraclavicular or axillary adenopathy. The lungs are clear to auscultation. The heart has regular rhythm and rate. The abdomen is soft with normal bowel sounds. Patient has a large hernia in the left lower quadrant area which seems reducible on exam today. There is no inguinal adenopathy appreciated on pelvic examination the external genitalia are unremarkable. A speculum exam is performed. There is mild radiation changes noted in the proximal vagina.  a Pap smear was obtained of the proximal vagina. On bimanual and rectovaginal examination there are no pelvic masses appreciated.  Lab Findings: Lab Results  Component Value Date   WBC 8.8 12/08/2011   HGB 12.7 12/08/2011   HCT 38.0 12/08/2011   MCV 94.5 12/08/2011   PLT 281 12/08/2011    @LASTCHEM @  Radiographic Findings: No results found.  Impression:  No evidence for recurrence on clinical exam today, Pap smear pending  Plan:  Routine followup in 6 months  _____________________________________    Billie Lade, PhD, MD

## 2012-06-14 NOTE — Progress Notes (Signed)
Patient here follow s/p rad tx endometrial adenocarcinoma, rad txs were:02/28/08;03/06/08; 03/13/08; & 03/20/08 2400 cGy/12fractions  Alert,oriented x3, no c/o pain, no discharge,bleeding, , not using dilator, no fatigue,eating and drinking well Last pap done on  12/08/11, doesn't see any GYN since Dr.Clarke-Pearson Here for pap specimen again also 2:42 PM

## 2012-06-18 ENCOUNTER — Telehealth: Payer: Self-pay

## 2012-06-18 NOTE — Telephone Encounter (Signed)
Patient informed of pap smear results performed 06/14/12 was negative for intraepithelial lesions or malignancy.

## 2012-12-13 ENCOUNTER — Other Ambulatory Visit (HOSPITAL_COMMUNITY)
Admission: RE | Admit: 2012-12-13 | Discharge: 2012-12-13 | Disposition: A | Discharge status: Home / Self Care | Payer: Medicare Other | Source: Ambulatory Visit | Attending: Radiation Oncology | Admitting: Radiation Oncology

## 2012-12-13 ENCOUNTER — Encounter: Payer: Self-pay | Admitting: Radiation Oncology

## 2012-12-13 ENCOUNTER — Ambulatory Visit
Admission: RE | Admit: 2012-12-13 | Discharge: 2012-12-13 | Disposition: A | Discharge status: Home / Self Care | Payer: Medicare Other | Source: Ambulatory Visit | Attending: Radiation Oncology | Admitting: Radiation Oncology

## 2012-12-13 VITALS — BP 184/77 | HR 73 | Temp 97.9°F | Resp 18 | Wt 195.8 lb

## 2012-12-13 DIAGNOSIS — Z124 Encounter for screening for malignant neoplasm of cervix: Secondary | ICD-10-CM | POA: Insufficient documentation

## 2012-12-13 DIAGNOSIS — C549 Malignant neoplasm of corpus uteri, unspecified: Secondary | ICD-10-CM

## 2012-12-13 NOTE — Progress Notes (Signed)
Hernia in left lower quadrant remains present. Denies vaginal bleeding, discharge, odor or itching. Reports having normal formed bowel movements each day without blood or pain. Denies burning with urination. Denies headache. Reports occasional vertigo.

## 2012-12-13 NOTE — Progress Notes (Signed)
Radiation Oncology         [phone] ________________________________  Name: Debbie [name] MRN: [id number]  Date: 12/13/2012  DOB: 1930/09/24  Follow-Up Visit Note  CC: No primary provider on file.  Clarke-Pearson, Daniel *  Diagnosis:   Stage IB grade 3 endometrial adenocarcinoma  Interval Since Last Radiation:  4 years and 9 months,  the patient completed vaginal brachytherapy alone  Narrative:  The patient returns today for routine follow-up.  She is doing well and without complaints. She denies any vaginal bleeding rectal bleeding or hematuria. She denies any pain in the left lower quadrant. Patient has a easily reducible hernia in this region.                             ALLERGIES:  is allergic to codeine.  Meds: Current Outpatient Prescriptions  Medication Sig Dispense Refill  . aspirin 81 MG tablet Take 81 mg by mouth daily.      [name] atenolol (TENORMIN) 100 MG tablet Take 100 mg by mouth daily.      . calcium-vitamin D (OSCAL WITH D) 500-200 MG-UNIT per tablet Take 1 tablet by mouth 2 (two) times daily.      [name] glimepiride (AMARYL) 4 MG tablet Take 4 mg by mouth daily before breakfast.      . lisinopril (PRINIVIL,ZESTRIL) 20 MG tablet Take 20 mg by mouth daily.      . metFORMIN (GLUCOPHAGE) 1000 MG tablet Take 1,000 mg by mouth 2 (two) times daily with a meal.      . Multiple Vitamin (MULTIVITAMIN) tablet Take 1 tablet by mouth daily. Centrum silver      . simvastatin (ZOCOR) 80 MG tablet Take 80 mg by mouth at bedtime.      . fish oil-omega-3 fatty acids 1000 MG capsule Take 1 g by mouth 2 (two) times daily.      . fluticasone (VERAMYST) 27.5 MCG/SPRAY nasal spray Place 2 sprays into the nose as needed. 50 mcg      . lisinopril-hydrochlorothiazide (PRINZIDE,ZESTORETIC) 20-25 MG per tablet        No current facility-administered medications for this encounter.    Physical Findings: The patient is in no acute distress. Patient is alert and oriented.  weight is 195 lb  12.8 oz (88.814 kg). Her oral temperature is 97.9 F (36.6 C). Her blood pressure is 184/77 and her pulse is 73. Her respiration is 18 and oxygen saturation is 100%. .  No palpable supraclavicular or axillary adenopathy. The lungs are clear to auscultation. The heart has a regular rhythm and rate. The abdomen is soft and nontender with normal bowel sounds. There appears to be a hernia on the left lower quadrant which is easily reducible. The inguinal areas are free of adenopathy. On pelvic examination the external genitalia are unremarkable. A speculum exam is performed. There is mild radiation changes noted in the proximal vagina. A Pap smear was obtained of the proximal vagina. On bimanual and rectovaginal examination there no pelvic masses appreciated.  Lab Findings: Lab Results  Component Value Date   WBC 8.8 12/08/2011   HGB 12.7 12/08/2011   HCT 38.0 12/08/2011   MCV 94.5 12/08/2011   PLT 281 12/08/2011     Radiographic Findings: No results found.  Impression:  No evidence of recurrence on clinical exam today, Pap smear pending  Plan:  Routine followup one year  _____________________________________  -----------------------------------  Billie Lade, PhD, MD

## 2012-12-17 ENCOUNTER — Telehealth: Payer: Self-pay | Admitting: *Deleted

## 2012-12-17 NOTE — Telephone Encounter (Signed)
Per Dr Kinard, called pt and informed her that her 12/13/12 pap smear results are normal. Pt verbalized understanding and thanked this RN for the phone call.  

## 2013-01-01 ENCOUNTER — Encounter (HOSPITAL_COMMUNITY): Payer: Self-pay | Admitting: Emergency Medicine

## 2013-01-01 ENCOUNTER — Emergency Department (INDEPENDENT_AMBULATORY_CARE_PROVIDER_SITE_OTHER)
Admission: EM | Admit: 2013-01-01 | Discharge: 2013-01-01 | Disposition: A | Discharge status: Home / Self Care | Payer: Medicare Other | Source: Home / Self Care

## 2013-01-01 DIAGNOSIS — H1130 Conjunctival hemorrhage, unspecified eye: Secondary | ICD-10-CM

## 2013-01-01 DIAGNOSIS — H1131 Conjunctival hemorrhage, right eye: Secondary | ICD-10-CM

## 2013-01-01 LAB — CBC
MCV: 90.4 fL (ref 78.0–100.0)
Platelets: 324 10*3/uL (ref 150–400)
RBC: 4.17 MIL/uL (ref 3.87–5.11)
WBC: 9.7 10*3/uL (ref 4.0–10.5)

## 2013-01-01 MED ORDER — ARTIFICIAL TEARS OP OINT: TOPICAL_OINTMENT | OPHTHALMIC | Status: DC | PRN

## 2013-01-01 NOTE — ED Notes (Signed)
Pt c/o subconjunctival hemorrhage to right eye... Reports she woke up this am w/a red eye; last night she was fine... Hx of seasonal allergies; has been scratching eye.... Denies: fevers, discharge, blurry vision.... Alert w/no signs of acute distress.

## 2013-01-01 NOTE — ED Provider Notes (Addendum)
Debbie [name] is a 79 y.o. female who presents to Urgent Care today for right subconjunctival hemorrhage. Patient awoke this morning with blood pooling under the conjunctiva of the right eye worse on the medial aspect extending to the inferior portion. She denies any injury or eye pain. She notes that she has been doing more coughing than usual recently. She denies any blurry vision and feels well otherwise. She has a remote several ill past history for cataract surgery. She feels well otherwise no fevers or chills.    PMH reviewed. Diabetes and hypertension. Not on any blood thinners History  Substance Use Topics  . Smoking status: Never Smoker   . Smokeless tobacco: Not on file  . Alcohol Use: No   ROS as above Medications reviewed. No current facility-administered medications for this encounter.   Current Outpatient Prescriptions  Medication Sig Dispense Refill  . aspirin 81 MG tablet Take 81 mg by mouth daily.      [name] atenolol (TENORMIN) 100 MG tablet Take 100 mg by mouth daily.      . calcium-vitamin D (OSCAL WITH D) 500-200 MG-UNIT per tablet Take 1 tablet by mouth 2 (two) times daily.      . fish oil-omega-3 fatty acids 1000 MG capsule Take 1 g by mouth 2 (two) times daily.      . fluticasone (VERAMYST) 27.5 MCG/SPRAY nasal spray Place 2 sprays into the nose as needed. 50 mcg      . glimepiride (AMARYL) 4 MG tablet Take 4 mg by mouth daily before breakfast.      . lisinopril (PRINIVIL,ZESTRIL) 20 MG tablet Take 20 mg by mouth daily.      [name] lisinopril-hydrochlorothiazide (PRINZIDE,ZESTORETIC) 20-25 MG per tablet       . metFORMIN (GLUCOPHAGE) 1000 MG tablet Take 1,000 mg by mouth 2 (two) times daily with a meal.      . Multiple Vitamin (MULTIVITAMIN) tablet Take 1 tablet by mouth daily. Centrum silver      . simvastatin (ZOCOR) 80 MG tablet Take 80 mg by mouth at bedtime.        Exam:  BP 147/100  Pulse 75  Temp(Src) 97.4 F (36.3 C) (Oral)  Resp 12  SpO2 95% Gen: Well  NAD HEENT: EOMI,  MMM, right eye subconjunctival hemorrhage involving the medial aspect extending to the inferior portion does not involve the cornea. There is no blood in the anterior chamber. Both retinas are visualized on funduscopic exam and normal appearing.  Visual acuity is 20/50 on the left, 25/70 on the right, and 20/50 bilaterally.  Lungs: CTABL Nl WOB Heart: RRR no MRG Abd: NABS, NT, ND Exts: Non edematous BL  LE, warm and well perfused.   No results found for this or any previous visit (from the past 24 hour(s)). No results found.  Assessment and Plan: 79 y.o. female with some conjunctival hemorrhage.  Limited to the sclera.  Likely due to coughing.  Plan to reassure patient and have her followup with ophthalmology on Monday or Tuesday. Discussed warning signs or symptoms. Please see discharge instructions. Patient expresses understanding. Will use Lacri-Lube as needed     Rodolph Bong, MD 01/01/13 3086  Rodolph Bong, MD 01/01/13 [phone]

## 2013-11-29 ENCOUNTER — Other Ambulatory Visit (HOSPITAL_COMMUNITY): Payer: Self-pay | Admitting: Internal Medicine

## 2013-11-29 DIAGNOSIS — R0989 Other specified symptoms and signs involving the circulatory and respiratory systems: Secondary | ICD-10-CM

## 2013-12-01 ENCOUNTER — Ambulatory Visit (HOSPITAL_COMMUNITY): Payer: Medicare Other

## 2013-12-02 ENCOUNTER — Ambulatory Visit (HOSPITAL_COMMUNITY)
Admission: RE | Admit: 2013-12-02 | Discharge: 2013-12-02 | Disposition: A | Discharge status: Home / Self Care | Payer: Medicare HMO | Source: Ambulatory Visit | Attending: Internal Medicine | Admitting: Internal Medicine

## 2013-12-02 DIAGNOSIS — I6529 Occlusion and stenosis of unspecified carotid artery: Secondary | ICD-10-CM | POA: Insufficient documentation

## 2013-12-02 DIAGNOSIS — R0989 Other specified symptoms and signs involving the circulatory and respiratory systems: Secondary | ICD-10-CM

## 2013-12-02 NOTE — Progress Notes (Addendum)
*  PRELIMINARY RESULTS* Vascular Ultrasound Carotid Duplex (Doppler) has been completed.   Findings suggest >80% internal carotid artery stenosis bilaterally. The right vertebral artery is patent with antegrade flow, the left vertebral artery is patent with retrograde flow. The right vertebral artery exhibits severely elevated velocities, possibly due to compensatory flow.  Preliminary results discussed with Dr.Roberts.  12/02/2013 3:18 PM Maudry Mayhew, RVT, RDCS, RDMS

## 2013-12-12 ENCOUNTER — Ambulatory Visit: Payer: Medicare Other | Admitting: Radiation Oncology

## 2013-12-26 ENCOUNTER — Other Ambulatory Visit: Payer: Self-pay | Admitting: *Deleted

## 2013-12-26 DIAGNOSIS — I6529 Occlusion and stenosis of unspecified carotid artery: Secondary | ICD-10-CM

## 2014-01-05 ENCOUNTER — Encounter: Payer: Self-pay | Admitting: Radiation Oncology

## 2014-01-05 ENCOUNTER — Ambulatory Visit
Admission: RE | Admit: 2014-01-05 | Discharge: 2014-01-05 | Disposition: A | Discharge status: Home / Self Care | Payer: Medicare HMO | Source: Ambulatory Visit | Attending: Radiation Oncology | Admitting: Radiation Oncology

## 2014-01-05 ENCOUNTER — Other Ambulatory Visit (HOSPITAL_COMMUNITY)
Admission: RE | Admit: 2014-01-05 | Discharge: 2014-01-05 | Disposition: A | Discharge status: Home / Self Care | Payer: Medicare HMO | Source: Ambulatory Visit | Attending: Radiation Oncology | Admitting: Radiation Oncology

## 2014-01-05 VITALS — BP 179/50 | HR 74 | Temp 97.9°F | Ht 65.0 in | Wt 192.4 lb

## 2014-01-05 DIAGNOSIS — C549 Malignant neoplasm of corpus uteri, unspecified: Secondary | ICD-10-CM

## 2014-01-05 DIAGNOSIS — Z124 Encounter for screening for malignant neoplasm of cervix: Secondary | ICD-10-CM | POA: Insufficient documentation

## 2014-01-05 NOTE — Progress Notes (Signed)
Debbie [name] here for follow up after treatment for enometrial cancer.  She denies pain, vaginal/rectal bleeding, bladder issues, bowel issues and fatigue.

## 2014-01-05 NOTE — Progress Notes (Signed)
  Radiation Oncology         [phone]) [phone] ________________________________  Name: Debbie [name] MRN: [id number]  Date: 01/05/2014  DOB: May 10, 1931  Follow-Up Visit Note  CC: Myriam Jacobson, MD  Marti Sleigh *  Diagnosis:   Stage IB grade 3 endometrial adenocarcinoma   Interval Since Last Radiation:  5 years and 9 months, the patient completed vaginal brachii therapy alone after her surgery   Narrative:  The patient returns today for routine follow-up.  She is doing well and without complaints. She denies any pelvic pain vaginal bleeding hematuria or rectal bleeding. She denies a problems with her hernia along the left lower abdominal region                              ALLERGIES:  is allergic to codeine.  Meds: Current Outpatient Prescriptions  Medication Sig Dispense Refill  . aspirin 81 MG tablet Take 81 mg by mouth daily.      [name] atenolol (TENORMIN) 100 MG tablet Take 100 mg by mouth daily.      [name] glimepiride (AMARYL) 4 MG tablet Take 4 mg by mouth daily before breakfast.      . lisinopril-hydrochlorothiazide (PRINZIDE,ZESTORETIC) 20-25 MG per tablet       . Loratadine (CLARITIN) 10 MG CAPS Take by mouth.      . metFORMIN (GLUCOPHAGE) 1000 MG tablet Take 1,000 mg by mouth 2 (two) times daily with a meal.      . Multiple Vitamin (MULTIVITAMIN) tablet Take 1 tablet by mouth daily. Centrum silver      . pioglitazone (ACTOS) 45 MG tablet       . simvastatin (ZOCOR) 80 MG tablet Take 80 mg by mouth at bedtime.      [name] lisinopril (PRINIVIL,ZESTRIL) 20 MG tablet Take 20 mg by mouth daily.       No current facility-administered medications for this encounter.    Physical Findings: The patient is in no acute distress. Patient is alert and oriented.  height is 5\' 5"  (1.651 m) and weight is 192 lb 6.4 oz (87.272 kg). Her oral temperature is 97.9 F (36.6 C). Her blood pressure is 179/50 and her pulse is 74. [name] No palpable supraclavicular or axillary adenopathy. The  lungs are clear to auscultation. The heart has regular rhythm and rate. The abdomen is soft and nontender with normal bowel sounds.  the patient has a large hernia in the left lower abdomen which is easily reducible. No inguinal adenopathy appreciated. On pelvic examination the external genitalia are unremarkable. A speculum exam is performed no mucosal lesions are noted. A Pap smear was obtained of the proximal vagina. On bimanual and rectovaginal examination there no pelvic masses appreciated.  Lab Findings: Lab Results  Component Value Date   WBC 9.7 01/01/2013   HGB 12.8 01/01/2013   HCT 37.7 01/01/2013   MCV 90.4 01/01/2013   PLT 324 01/01/2013      Radiographic Findings: No results found.  Impression:  No evidence of recurrence on clinical exam today, Pap smear pending  Plan:  Routine followup in one year as the patient does not have a primary care physician to perform yearly Pap smears  ____________________________________ [name], MD

## 2014-01-06 LAB — CYTOLOGY - PAP

## 2014-01-09 ENCOUNTER — Encounter: Payer: Self-pay | Admitting: Vascular Surgery

## 2014-01-10 ENCOUNTER — Encounter: Payer: Self-pay | Admitting: *Deleted

## 2014-01-10 ENCOUNTER — Other Ambulatory Visit: Payer: Self-pay | Admitting: Vascular Surgery

## 2014-01-10 ENCOUNTER — Ambulatory Visit (HOSPITAL_COMMUNITY)
Admission: RE | Admit: 2014-01-10 | Discharge: 2014-01-10 | Disposition: A | Discharge status: Home / Self Care | Payer: Medicare HMO | Source: Ambulatory Visit | Attending: Vascular Surgery | Admitting: Vascular Surgery

## 2014-01-10 ENCOUNTER — Other Ambulatory Visit: Payer: Self-pay | Admitting: *Deleted

## 2014-01-10 ENCOUNTER — Encounter (HOSPITAL_COMMUNITY): Payer: Self-pay | Admitting: Pharmacy Technician

## 2014-01-10 ENCOUNTER — Ambulatory Visit (INDEPENDENT_AMBULATORY_CARE_PROVIDER_SITE_OTHER): Payer: Medicare HMO | Admitting: Vascular Surgery

## 2014-01-10 ENCOUNTER — Encounter: Payer: Self-pay | Admitting: Vascular Surgery

## 2014-01-10 VITALS — BP 134/82 | HR 68 | Resp 16 | Ht 65.0 in | Wt 190.0 lb

## 2014-01-10 DIAGNOSIS — I6529 Occlusion and stenosis of unspecified carotid artery: Secondary | ICD-10-CM

## 2014-01-10 NOTE — Progress Notes (Signed)
Subjective:     Patient ID: Debbie [name], female   DOB: Aug 10, 1930, 80 y.o.   MRN: [id number]  HPI this 80 year old female was referred by Dr. Janeice Robinson for evaluation of severe bilateral carotid occlusive disease. Patient has no history of stroke or TIA. She denies lateralizing weakness, achalasia, amaurosis fugax, diplopia, blurred vision, or syncope. She also denies chest pain or dyspnea on exertion. She has no cardiac history. Her biggest complaints are related to her right knee affecting her ambulation. She is not a smoker.  Past Medical History  Diagnosis Date  . Cancer dx'd 12/2007    endometroid adenocarcinoma  . Diabetes mellitus   . Hypertension   . History of radiation therapy 9/21,9/28,10/05,05/20/2008    4 txs 2400 cGy endometrial adenocarcinoma    History  Substance Use Topics  . Smoking status: Never Smoker   . Smokeless tobacco: Not on file  . Alcohol Use: No    Family History  Problem Relation Age of Onset  . Actinic keratosis Mother     Allergies  Allergen Reactions  . Codeine     Unusual mouth sensation    Current outpatient prescriptions:aspirin 81 MG tablet, Take 81 mg by mouth daily., Disp: , Rfl: ;  atenolol (TENORMIN) 100 MG tablet, Take 100 mg by mouth daily., Disp: , Rfl: ;  glimepiride (AMARYL) 4 MG tablet, Take 4 mg by mouth daily before breakfast., Disp: , Rfl: ;  lisinopril (PRINIVIL,ZESTRIL) 20 MG tablet, Take 20 mg by mouth daily., Disp: , Rfl: ;  lisinopril-hydrochlorothiazide (PRINZIDE,ZESTORETIC) 20-25 MG per tablet, , Disp: , Rfl:  Loratadine (CLARITIN) 10 MG CAPS, Take by mouth., Disp: , Rfl: ;  metFORMIN (GLUCOPHAGE) 1000 MG tablet, Take 1,000 mg by mouth 2 (two) times daily with a meal., Disp: , Rfl: ;  Multiple Vitamin (MULTIVITAMIN) tablet, Take 1 tablet by mouth daily. Centrum silver, Disp: , Rfl: ;  pioglitazone (ACTOS) 45 MG tablet, , Disp: , Rfl: ;  simvastatin (ZOCOR) 80 MG tablet, Take 80 mg by mouth at bedtime., Disp: , Rfl:    BP 134/82  Pulse 68  Resp 16  Ht 5\' 5"  (1.651 m)  Wt 190 lb (86.183 kg)  BMI 31.62 kg/m2  Body mass index is 31.62 kg/(m^2).           Review of Systems denies chest pain, dyspnea on exertion, PND, orthopnea, hemoptysis, claudication. Has severe right knee discomfort due to arthritis. Has diabetes mellitus controlled with oral medication. All other systems negative and a complete review of systems    Objective:   Physical Exam BP 134/82  Pulse 68  Resp 16  Ht 5\' 5"  (1.651 m)  Wt 190 lb (86.183 kg)  BMI 31.62 kg/m2  Gen.-alert and oriented x3 in no apparent distress HEENT normal for age Lungs no rhonchi or wheezing Cardiovascular regular rhythm no murmurs carotid pulses 3+ palpable bilateral harsh bruits. Abdomen soft nontender no palpable masses Musculoskeletal free of  major deformities Skin clear -no rashes Neurologic normal Lower extremities 3+ femoral and dorsalis pedis pulses palpable bilaterally with 1+ edema.  Today our carotid duplex exam which I reviewed and interpreted. I compared this to previous study done a few weeks ago. At agreed that she does have severe bilateral ICA stenosis left worse than right both in the 90% range in severity.        Assessment:     Bilateral severe ICA stenosis left greater than right-both approximating 90%-asymptomatic    Plan:  Plan left carotid endarterectomy on Thursday, August 6. Risks and benefits thoroughly discussed with patient and her son and she would like to proceed. She will then need right carotid endarterectomy in the near future.

## 2014-01-10 NOTE — Pre-Procedure Instructions (Signed)
Debbie [name]  01/10/2014   Your procedure is scheduled on: Thursday, January 12, 2014 at 7:30 AM  Report to [facility] Admitting at 5:30 AM.  Call this number if you have problems the morning of surgery: [phone]   Remember:   Do not eat food or drink liquids after midnight tonight.   Take these medicines the morning of surgery with A SIP OF WATER: atenolol (TENORMIN),              DO NOT TAKE ANY DIABETIC MEDICATIONS THE MORNING OF PROCEDURE  Do not wear jewelry, make-up or nail polish.  Do not wear lotions, powders, or perfumes. You may wear deodorant.  Do not shave 48 hours prior to surgery.   Do not bring valuables to the hospital.  [facility] is not responsible for any belongings or valuables.               Contacts, dentures or bridgework may not be worn into surgery.  Leave suitcase in the car. After surgery it may be brought to your room.  For patients admitted to the hospital, discharge time is determined by your treatment team.               Patients discharged the day of surgery will not be allowed to drive home.  Name and phone number of your driver:   Special Instructions:  Special Instructions:Special Instructions: [facility] - Preparing for Surgery  Before surgery, you can play an important role.  Because skin is not sterile, your skin needs to be as free of germs as possible.  You can reduce the number of germs on you skin by washing with CHG (chlorahexidine gluconate) soap before surgery.  CHG is an antiseptic cleaner which kills germs and bonds with the skin to continue killing germs even after washing.  Please DO NOT use if you have an allergy to CHG or antibacterial soaps.  If your skin becomes reddened/irritated stop using the CHG and inform your nurse when you arrive at Short Stay.  Do not shave (including legs and underarms) for at least 48 hours prior to the first CHG shower.  You may shave your face.  Please follow these instructions  carefully:   1.  Shower with CHG Soap the night before surgery and the morning of Surgery.  2.  If you choose to wash your hair, wash your hair first as usual with your normal shampoo.  3.  After you shampoo, rinse your hair and body thoroughly to remove the Shampoo.  4.  Use CHG as you would any other liquid soap.  You can apply chg directly  to the skin and wash gently with scrungie or a clean washcloth.  5.  Apply the CHG Soap to your body ONLY FROM THE NECK DOWN.  Do not use on open wounds or open sores.  Avoid contact with your eyes, ears, mouth and genitals (private parts).  Wash genitals (private parts) with your normal soap.  6.  Wash thoroughly, paying special attention to the area where your surgery will be performed.  7.  Thoroughly rinse your body with warm water from the neck down.  8.  DO NOT shower/wash with your normal soap after using and rinsing off the CHG Soap.  9.  Pat yourself dry with a clean towel.            10.  Wear clean pajamas.  11.  Place clean sheets on your bed the night of your first shower and do not sleep with pets.  Day of Surgery  Do not apply any lotions the morning of surgery.  Please wear clean clothes to the hospital/surgery center.   Please read over the following fact sheets that you were given: Pain Booklet, Coughing and Deep Breathing, Blood Transfusion Information, MRSA Information and Surgical Site Infection Prevention

## 2014-01-11 ENCOUNTER — Encounter (HOSPITAL_COMMUNITY): Payer: Self-pay

## 2014-01-11 ENCOUNTER — Encounter (HOSPITAL_COMMUNITY)
Admission: RE | Admit: 2014-01-11 | Discharge: 2014-01-11 | Disposition: A | Discharge status: Home / Self Care | Payer: Medicare HMO | Source: Ambulatory Visit | Attending: Anesthesiology | Admitting: Anesthesiology

## 2014-01-11 ENCOUNTER — Encounter (HOSPITAL_COMMUNITY)
Admission: RE | Admit: 2014-01-11 | Discharge: 2014-01-11 | Disposition: A | Discharge status: Home / Self Care | Payer: Medicare HMO | Source: Ambulatory Visit | Attending: Vascular Surgery | Admitting: Vascular Surgery

## 2014-01-11 HISTORY — DX: Other seasonal allergic rhinitis: J30.2

## 2014-01-11 HISTORY — DX: Unspecified osteoarthritis, unspecified site: M19.90

## 2014-01-11 HISTORY — DX: Peripheral vascular disease, unspecified: I73.9

## 2014-01-11 LAB — SURGICAL PCR SCREEN
MRSA, PCR: NEGATIVE
STAPHYLOCOCCUS AUREUS: NEGATIVE

## 2014-01-11 LAB — COMPREHENSIVE METABOLIC PANEL
ALBUMIN: 3.6 g/dL (ref 3.5–5.2)
ALT: 22 U/L (ref 0–35)
AST: 32 U/L (ref 0–37)
Alkaline Phosphatase: 54 U/L (ref 39–117)
Anion gap: 14 (ref 5–15)
BUN: 36 mg/dL — ABNORMAL HIGH (ref 6–23)
CALCIUM: 9.5 mg/dL (ref 8.4–10.5)
CO2: 25 meq/L (ref 19–32)
Chloride: 101 mEq/L (ref 96–112)
Creatinine, Ser: 1.54 mg/dL — ABNORMAL HIGH (ref 0.50–1.10)
GFR calc Af Amer: 35 mL/min — ABNORMAL LOW (ref >90)
GFR calc non Af Amer: 30 mL/min — ABNORMAL LOW (ref >90)
Glucose, Bld: 144 mg/dL — ABNORMAL HIGH (ref 70–99)
Potassium: 4.7 mEq/L (ref 3.7–5.3)
SODIUM: 140 meq/L (ref 137–147)
TOTAL PROTEIN: 7.3 g/dL (ref 6.0–8.3)
Total Bilirubin: 0.6 mg/dL (ref 0.3–1.2)

## 2014-01-11 LAB — PROTIME-INR
INR: 0.96 (ref 0.00–1.49)
Prothrombin Time: 12.8 seconds (ref 11.6–15.2)

## 2014-01-11 LAB — CBC
HCT: 40.3 % (ref 36.0–46.0)
Hemoglobin: 12.8 g/dL (ref 12.0–15.0)
MCH: 30.1 pg (ref 26.0–34.0)
MCHC: 31.8 g/dL (ref 30.0–36.0)
MCV: 94.8 fL (ref 78.0–100.0)
Platelets: 272 10*3/uL (ref 150–400)
RBC: 4.25 MIL/uL (ref 3.87–5.11)
RDW: 14.6 % (ref 11.5–15.5)
WBC: 8 10*3/uL (ref 4.0–10.5)

## 2014-01-11 LAB — PREPARE RBC (CROSSMATCH)

## 2014-01-11 LAB — ABO/RH: ABO/RH(D): O POS

## 2014-01-11 LAB — APTT: aPTT: 27 seconds (ref 24–37)

## 2014-01-11 MED ORDER — CHLORHEXIDINE GLUCONATE 4 % EX LIQD: 60.0000 mL | Freq: Once | CUTANEOUS | Status: DC

## 2014-01-11 MED ORDER — SODIUM CHLORIDE 0.9 % IV SOLN: INTRAVENOUS | Status: DC

## 2014-01-11 MED ORDER — DEXTROSE 5 % IV SOLN
1.5000 g | INTRAVENOUS | Status: AC
  Administered 2014-01-12: 1.5 g via INTRAVENOUS

## 2014-01-11 NOTE — Progress Notes (Signed)
Patient unable to get urine specimen at PAT appointment, will bring specimen in AM.

## 2014-01-11 NOTE — Progress Notes (Signed)
Anesthesia Chart Review:  Patient is a 80 year old female scheduled for left CEA on 01/12/14 by Dr. Kellie Simmering. She has bilateral severe > 80% ICA stenosis, L > R and will need right CEA in the near future as well.  She has been asymptomatic.  History includes non-smoker, DM2, HTN, PAD, arthritis, nasal septum surgery, endometrial cancer s/p hysterectomy '09 and brachii therapy (Dr. Sondra Come), cataract extraction, appendectomy. PCP is listed as Dr. [name]. Her PAT RN reported that patient's BP on arrival was 90/46, but was asymptomatic.  After PAT, her BP was 132/82.   EKG on 01/11/14 showed: SR with first degree AVB.  CXR on 01/11/14 showed moderate cardiomegaly without decompensation.  Preoperative labs noted. BUN/Cr 36/1.54, previously 30/1.43. On 12/08/11.  Glucose 144. CBC, PT/PTT WNL. UA is still pending.  Based on currently available information, I would anticipate that she could proceed as planned.  Further evaluation by her assigned anesthesiologist on the day of surgery.  George Hugh [facility] Short Stay Center/Anesthesiology Phone [phone] 01/11/2014 2:01 PM

## 2014-01-11 NOTE — Progress Notes (Signed)
BP upon arrival for PAT appointment patient 90/46 HR-67 O2 sat 97%. Stated no dizzines or weakness,Alert and oriented. After interview BP 132/82 HR 69 O2 sat 97%.

## 2014-01-12 ENCOUNTER — Encounter (HOSPITAL_COMMUNITY): Payer: Medicare HMO | Admitting: Vascular Surgery

## 2014-01-12 ENCOUNTER — Inpatient Hospital Stay (HOSPITAL_COMMUNITY): Payer: Medicare HMO | Admitting: Anesthesiology

## 2014-01-12 ENCOUNTER — Encounter (HOSPITAL_COMMUNITY)
Admission: RE | Disposition: A | Discharge status: Home / Self Care | Payer: Self-pay | Source: Ambulatory Visit | Attending: Vascular Surgery

## 2014-01-12 ENCOUNTER — Inpatient Hospital Stay (HOSPITAL_COMMUNITY)
Admission: RE | Admit: 2014-01-12 | Discharge: 2014-01-13 | DRG: 039 | Disposition: A | Discharge status: Home / Self Care | Payer: Medicare HMO | Source: Ambulatory Visit | Attending: Vascular Surgery | Admitting: Vascular Surgery

## 2014-01-12 ENCOUNTER — Encounter (HOSPITAL_COMMUNITY): Payer: Self-pay | Admitting: *Deleted

## 2014-01-12 DIAGNOSIS — E119 Type 2 diabetes mellitus without complications: Secondary | ICD-10-CM | POA: Diagnosis present

## 2014-01-12 DIAGNOSIS — I959 Hypotension, unspecified: Secondary | ICD-10-CM | POA: Diagnosis not present

## 2014-01-12 DIAGNOSIS — Z8542 Personal history of malignant neoplasm of other parts of uterus: Secondary | ICD-10-CM

## 2014-01-12 DIAGNOSIS — I10 Essential (primary) hypertension: Secondary | ICD-10-CM | POA: Diagnosis present

## 2014-01-12 DIAGNOSIS — R490 Dysphonia: Secondary | ICD-10-CM | POA: Diagnosis not present

## 2014-01-12 DIAGNOSIS — Z923 Personal history of irradiation: Secondary | ICD-10-CM

## 2014-01-12 DIAGNOSIS — I6529 Occlusion and stenosis of unspecified carotid artery: Secondary | ICD-10-CM | POA: Diagnosis present

## 2014-01-12 DIAGNOSIS — I63239 Cerebral infarction due to unspecified occlusion or stenosis of unspecified carotid arteries: Secondary | ICD-10-CM | POA: Diagnosis present

## 2014-01-12 DIAGNOSIS — I6522 Occlusion and stenosis of left carotid artery: Secondary | ICD-10-CM

## 2014-01-12 HISTORY — PX: ENDARTERECTOMY: SHX5162

## 2014-01-12 LAB — CBC
HEMATOCRIT: 29.5 % — AB (ref 36.0–46.0)
HEMOGLOBIN: 9.7 g/dL — AB (ref 12.0–15.0)
MCH: 29.7 pg (ref 26.0–34.0)
MCHC: 32.9 g/dL (ref 30.0–36.0)
MCV: 90.2 fL (ref 78.0–100.0)
Platelets: 213 10*3/uL (ref 150–400)
RBC: 3.27 MIL/uL — AB (ref 3.87–5.11)
RDW: 14.3 % (ref 11.5–15.5)
WBC: 10.6 10*3/uL — ABNORMAL HIGH (ref 4.0–10.5)

## 2014-01-12 LAB — GLUCOSE, CAPILLARY
GLUCOSE-CAPILLARY: 143 mg/dL — AB (ref 70–99)
GLUCOSE-CAPILLARY: 156 mg/dL — AB (ref 70–99)
Glucose-Capillary: 148 mg/dL — ABNORMAL HIGH (ref 70–99)
Glucose-Capillary: 138 mg/dL — ABNORMAL HIGH (ref 70–99)

## 2014-01-12 LAB — CREATININE, SERUM
CREATININE: 1.31 mg/dL — AB (ref 0.50–1.10)
GFR calc Af Amer: 43 mL/min — ABNORMAL LOW (ref >90)
GFR calc non Af Amer: 37 mL/min — ABNORMAL LOW (ref >90)

## 2014-01-12 LAB — URINALYSIS, ROUTINE W REFLEX MICROSCOPIC
Bilirubin Urine: NEGATIVE
GLUCOSE, UA: NEGATIVE mg/dL
KETONES UR: NEGATIVE mg/dL
NITRITE: NEGATIVE
PH: 8 (ref 5.0–8.0)
Protein, ur: 100 mg/dL — AB
SPECIFIC GRAVITY, URINE: 1.013 (ref 1.005–1.030)
Urobilinogen, UA: 1 mg/dL (ref 0.0–1.0)

## 2014-01-12 LAB — HEMOGLOBIN A1C
HEMOGLOBIN A1C: 6.6 % — AB (ref <5.7)
MEAN PLASMA GLUCOSE: 143 mg/dL — AB (ref <117)

## 2014-01-12 LAB — URINE MICROSCOPIC-ADD ON

## 2014-01-12 SURGERY — ENDARTERECTOMY, CAROTID
Anesthesia: General | Site: Neck | Laterality: Left

## 2014-01-12 MED ORDER — ASPIRIN EC 325 MG PO TBEC
325.0000 mg | DELAYED_RELEASE_TABLET | Freq: Every day | ORAL | Status: DC
  Administered 2014-01-12 – 2014-01-13 (×2): 325 mg via ORAL
  Filled 2014-01-12 (×2): qty 1

## 2014-01-12 MED ORDER — LIDOCAINE HCL (PF) 1 % IJ SOLN
INTRAMUSCULAR | Status: AC
  Filled 2014-01-12: qty 30

## 2014-01-12 MED ORDER — OXYCODONE HCL 5 MG PO TABS: 5.0000 mg | ORAL_TABLET | ORAL | Status: DC | PRN

## 2014-01-12 MED ORDER — PROTAMINE SULFATE 10 MG/ML IV SOLN
INTRAVENOUS | Status: DC | PRN
  Administered 2014-01-12: 20 mg via INTRAVENOUS
  Administered 2014-01-12: 10 mg via INTRAVENOUS
  Administered 2014-01-12: 20 mg via INTRAVENOUS

## 2014-01-12 MED ORDER — SODIUM CHLORIDE 0.9 % IV SOLN
500.0000 mL | Freq: Once | INTRAVENOUS | Status: AC | PRN
  Administered 2014-01-12: 500 mL via INTRAVENOUS

## 2014-01-12 MED ORDER — BISACODYL 10 MG RE SUPP: 10.0000 mg | Freq: Every day | RECTAL | Status: DC | PRN

## 2014-01-12 MED ORDER — 0.9 % SODIUM CHLORIDE (POUR BTL) OPTIME
TOPICAL | Status: DC | PRN
  Administered 2014-01-12 (×2): 1000 mL

## 2014-01-12 MED ORDER — PHENOL 1.4 % MT LIQD
1.0000 | OROMUCOSAL | Status: DC | PRN
  Administered 2014-01-12: 1 via OROMUCOSAL
  Filled 2014-01-12: qty 177

## 2014-01-12 MED ORDER — GLYCOPYRROLATE 0.2 MG/ML IJ SOLN
INTRAMUSCULAR | Status: AC
  Filled 2014-01-12: qty 1

## 2014-01-12 MED ORDER — PIOGLITAZONE HCL 45 MG PO TABS
45.0000 mg | ORAL_TABLET | Freq: Every day | ORAL | Status: DC
  Administered 2014-01-12 – 2014-01-13 (×2): 45 mg via ORAL
  Filled 2014-01-12 (×4): qty 1

## 2014-01-12 MED ORDER — NEOSTIGMINE METHYLSULFATE 10 MG/10ML IV SOLN
INTRAVENOUS | Status: DC | PRN
  Administered 2014-01-12: 3 mg via INTRAVENOUS

## 2014-01-12 MED ORDER — PROMETHAZINE HCL 25 MG/ML IJ SOLN
12.5000 mg | Freq: Four times a day (QID) | INTRAMUSCULAR | Status: DC | PRN
Start: 2014-01-12 — End: 2014-01-13
  Filled 2014-01-12: qty 1

## 2014-01-12 MED ORDER — ONDANSETRON HCL 4 MG/2ML IJ SOLN
INTRAMUSCULAR | Status: DC | PRN
  Administered 2014-01-12: 4 mg via INTRAVENOUS

## 2014-01-12 MED ORDER — SUCCINYLCHOLINE CHLORIDE 20 MG/ML IJ SOLN
INTRAMUSCULAR | Status: AC
  Filled 2014-01-12: qty 1

## 2014-01-12 MED ORDER — ARTIFICIAL TEARS OP OINT
TOPICAL_OINTMENT | OPHTHALMIC | Status: AC
  Filled 2014-01-12: qty 3.5

## 2014-01-12 MED ORDER — HEPARIN SODIUM (PORCINE) 1000 UNIT/ML IJ SOLN
INTRAMUSCULAR | Status: AC
  Filled 2014-01-12: qty 1

## 2014-01-12 MED ORDER — DOPAMINE-DEXTROSE 3.2-5 MG/ML-% IV SOLN
3.0000 ug/kg/min | INTRAVENOUS | Status: DC
  Administered 2014-01-12: 3.5 ug/kg/min via INTRAVENOUS
  Filled 2014-01-12: qty 250

## 2014-01-12 MED ORDER — INSULIN ASPART 100 UNIT/ML ~~LOC~~ SOLN
0.0000 [IU] | Freq: Three times a day (TID) | SUBCUTANEOUS | Status: DC
  Administered 2014-01-12: 1 [IU] via SUBCUTANEOUS
  Administered 2014-01-13: 2 [IU] via SUBCUTANEOUS

## 2014-01-12 MED ORDER — ROCURONIUM BROMIDE 100 MG/10ML IV SOLN
INTRAVENOUS | Status: DC | PRN
  Administered 2014-01-12: 40 mg via INTRAVENOUS

## 2014-01-12 MED ORDER — LACTATED RINGERS IV SOLN
INTRAVENOUS | Status: DC | PRN
  Administered 2014-01-12 (×2): via INTRAVENOUS

## 2014-01-12 MED ORDER — ONDANSETRON HCL 4 MG/2ML IJ SOLN
4.0000 mg | Freq: Four times a day (QID) | INTRAMUSCULAR | Status: DC | PRN
  Administered 2014-01-12: 4 mg via INTRAVENOUS
  Filled 2014-01-12: qty 2

## 2014-01-12 MED ORDER — PANTOPRAZOLE SODIUM 40 MG PO TBEC
40.0000 mg | DELAYED_RELEASE_TABLET | Freq: Every day | ORAL | Status: DC
  Administered 2014-01-12 – 2014-01-13 (×2): 40 mg via ORAL
  Filled 2014-01-12 (×2): qty 1

## 2014-01-12 MED ORDER — LISINOPRIL-HYDROCHLOROTHIAZIDE 20-25 MG PO TABS: 1.0000 | ORAL_TABLET | Freq: Every day | ORAL | Status: DC

## 2014-01-12 MED ORDER — POTASSIUM CHLORIDE CRYS ER 20 MEQ PO TBCR: 20.0000 meq | EXTENDED_RELEASE_TABLET | Freq: Every day | ORAL | Status: DC | PRN

## 2014-01-12 MED ORDER — LIDOCAINE HCL (CARDIAC) 20 MG/ML IV SOLN
INTRAVENOUS | Status: DC | PRN
  Administered 2014-01-12: 60 mg via INTRAVENOUS

## 2014-01-12 MED ORDER — ONE-DAILY MULTI VITAMINS PO TABS: 1.0000 | ORAL_TABLET | Freq: Every day | ORAL | Status: DC

## 2014-01-12 MED ORDER — SODIUM CHLORIDE 0.9 % IR SOLN
Status: DC | PRN
  Administered 2014-01-12: 07:00:00

## 2014-01-12 MED ORDER — FENTANYL CITRATE 0.05 MG/ML IJ SOLN
INTRAMUSCULAR | Status: DC | PRN
  Administered 2014-01-12: 100 ug via INTRAVENOUS
  Administered 2014-01-12: 50 ug via INTRAVENOUS

## 2014-01-12 MED ORDER — ALUM & MAG HYDROXIDE-SIMETH 200-200-20 MG/5ML PO SUSP: 15.0000 mL | ORAL | Status: DC | PRN

## 2014-01-12 MED ORDER — ACETAMINOPHEN 325 MG PO TABS: 325.0000 mg | ORAL_TABLET | ORAL | Status: DC | PRN

## 2014-01-12 MED ORDER — PROPOFOL 10 MG/ML IV BOLUS
INTRAVENOUS | Status: AC
  Filled 2014-01-12: qty 20

## 2014-01-12 MED ORDER — FENTANYL CITRATE 0.05 MG/ML IJ SOLN: 25.0000 ug | INTRAMUSCULAR | Status: DC | PRN

## 2014-01-12 MED ORDER — METOPROLOL TARTRATE 1 MG/ML IV SOLN: 2.0000 mg | INTRAVENOUS | Status: DC | PRN

## 2014-01-12 MED ORDER — METFORMIN HCL 500 MG PO TABS: 1000.0000 mg | ORAL_TABLET | Freq: Two times a day (BID) | ORAL | Status: DC

## 2014-01-12 MED ORDER — MORPHINE SULFATE 2 MG/ML IJ SOLN: 2.0000 mg | INTRAMUSCULAR | Status: DC | PRN

## 2014-01-12 MED ORDER — PHENYLEPHRINE HCL 10 MG/ML IJ SOLN
10.0000 mg | INTRAVENOUS | Status: DC | PRN
  Administered 2014-01-12: 25 ug/min via INTRAVENOUS

## 2014-01-12 MED ORDER — ENOXAPARIN SODIUM 30 MG/0.3ML ~~LOC~~ SOLN
30.0000 mg | SUBCUTANEOUS | Status: DC
  Administered 2014-01-13: 30 mg via SUBCUTANEOUS
  Filled 2014-01-12 (×2): qty 0.3

## 2014-01-12 MED ORDER — GUAIFENESIN-DM 100-10 MG/5ML PO SYRP: 15.0000 mL | ORAL_SOLUTION | ORAL | Status: DC | PRN

## 2014-01-12 MED ORDER — HYDRALAZINE HCL 20 MG/ML IJ SOLN: 10.0000 mg | INTRAMUSCULAR | Status: DC | PRN

## 2014-01-12 MED ORDER — INSULIN ASPART 100 UNIT/ML ~~LOC~~ SOLN: 0.0000 [IU] | Freq: Every day | SUBCUTANEOUS | Status: DC

## 2014-01-12 MED ORDER — ATENOLOL 100 MG PO TABS
100.0000 mg | ORAL_TABLET | Freq: Every day | ORAL | Status: DC
  Filled 2014-01-12: qty 1

## 2014-01-12 MED ORDER — LABETALOL HCL 5 MG/ML IV SOLN: 10.0000 mg | INTRAVENOUS | Status: DC | PRN

## 2014-01-12 MED ORDER — LORATADINE 10 MG PO TABS
10.0000 mg | ORAL_TABLET | Freq: Every day | ORAL | Status: DC | PRN
Start: 2014-01-12 — End: 2014-01-13
  Filled 2014-01-12: qty 1

## 2014-01-12 MED ORDER — HEPARIN SODIUM (PORCINE) 1000 UNIT/ML IJ SOLN
INTRAMUSCULAR | Status: DC | PRN
  Administered 2014-01-12: 6000 [IU] via INTRAVENOUS

## 2014-01-12 MED ORDER — LORATADINE 10 MG PO CAPS: 10.0000 mg | ORAL_CAPSULE | Freq: Every day | ORAL | Status: DC | PRN

## 2014-01-12 MED ORDER — SODIUM CHLORIDE 0.9 % IV SOLN
INTRAVENOUS | Status: DC
  Administered 2014-01-12: 14:00:00 via INTRAVENOUS
  Administered 2014-01-12: 500 mL via INTRAVENOUS

## 2014-01-12 MED ORDER — GLYCOPYRROLATE 0.2 MG/ML IJ SOLN
INTRAMUSCULAR | Status: DC | PRN
  Administered 2014-01-12: .4 mg via INTRAVENOUS

## 2014-01-12 MED ORDER — ATORVASTATIN CALCIUM 40 MG PO TABS
40.0000 mg | ORAL_TABLET | Freq: Every day | ORAL | Status: DC
  Filled 2014-01-12 (×2): qty 1

## 2014-01-12 MED ORDER — DOCUSATE SODIUM 100 MG PO CAPS
100.0000 mg | ORAL_CAPSULE | Freq: Every day | ORAL | Status: DC
  Administered 2014-01-13: 100 mg via ORAL
  Filled 2014-01-12: qty 1

## 2014-01-12 MED ORDER — PROPOFOL 10 MG/ML IV BOLUS
INTRAVENOUS | Status: DC | PRN
  Administered 2014-01-12: 140 mg via INTRAVENOUS

## 2014-01-12 MED ORDER — CEFUROXIME SODIUM 1.5 G IJ SOLR
INTRAMUSCULAR | Status: AC
  Filled 2014-01-12: qty 1.5

## 2014-01-12 MED ORDER — LISINOPRIL 20 MG PO TABS
20.0000 mg | ORAL_TABLET | Freq: Every day | ORAL | Status: DC
  Filled 2014-01-12 (×2): qty 1

## 2014-01-12 MED ORDER — HYDROCHLOROTHIAZIDE 25 MG PO TABS
25.0000 mg | ORAL_TABLET | Freq: Every day | ORAL | Status: DC
  Filled 2014-01-12 (×2): qty 1

## 2014-01-12 MED ORDER — DEXTROSE 5 % IV SOLN
1.5000 g | Freq: Two times a day (BID) | INTRAVENOUS | Status: AC
  Administered 2014-01-12 – 2014-01-13 (×2): 1.5 g via INTRAVENOUS
  Filled 2014-01-12 (×2): qty 1.5

## 2014-01-12 MED ORDER — ADULT MULTIVITAMIN W/MINERALS CH
1.0000 | ORAL_TABLET | Freq: Every day | ORAL | Status: DC
  Administered 2014-01-12 – 2014-01-13 (×2): 1 via ORAL
  Filled 2014-01-12 (×2): qty 1

## 2014-01-12 MED ORDER — ACETAMINOPHEN 650 MG RE SUPP: 325.0000 mg | RECTAL | Status: DC | PRN

## 2014-01-12 MED ORDER — FENTANYL CITRATE 0.05 MG/ML IJ SOLN
INTRAMUSCULAR | Status: AC
  Filled 2014-01-12: qty 5

## 2014-01-12 SURGICAL SUPPLY — 57 items
CANISTER SUCTION 2500CC (MISCELLANEOUS) ×3 IMPLANT
CATH ROBINSON RED A/P 18FR (CATHETERS) ×3 IMPLANT
CATH SUCT 10FR WHISTLE TIP (CATHETERS) ×3 IMPLANT
CLIP TI MEDIUM 24 (CLIP) ×3 IMPLANT
CLIP TI WIDE RED SMALL 24 (CLIP) ×3 IMPLANT
COVER SURGICAL LIGHT HANDLE (MISCELLANEOUS) ×3 IMPLANT
CRADLE DONUT ADULT HEAD (MISCELLANEOUS) ×3 IMPLANT
DECANTER SPIKE VIAL GLASS SM (MISCELLANEOUS) IMPLANT
DRAIN HEMOVAC 1/8 X 5 (WOUND CARE) IMPLANT
DRAIN SNY 10X20 3/4 PERF (WOUND CARE) ×2 IMPLANT
DRAPE PROXIMA HALF (DRAPES) ×2 IMPLANT
DRAPE WARM FLUID 44X44 (DRAPE) ×3 IMPLANT
DRSG COVADERM 4X8 (GAUZE/BANDAGES/DRESSINGS) ×2 IMPLANT
ELECT REM PT RETURN 9FT ADLT (ELECTROSURGICAL) ×3
ELECTRODE REM PT RTRN 9FT ADLT (ELECTROSURGICAL) ×1 IMPLANT
EVACUATOR SILICONE 100CC (DRAIN) ×2 IMPLANT
GAUZE SPONGE 2X2 8PLY STRL LF (GAUZE/BANDAGES/DRESSINGS) IMPLANT
GLOVE BIO SURGEON STRL SZ 6.5 (GLOVE) ×2 IMPLANT
GLOVE BIO SURGEON STRL SZ7.5 (GLOVE) ×2 IMPLANT
GLOVE BIO SURGEONS STRL SZ 6.5 (GLOVE) ×2
GLOVE BIOGEL PI IND STRL 6.5 (GLOVE) IMPLANT
GLOVE BIOGEL PI IND STRL 7.0 (GLOVE) IMPLANT
GLOVE BIOGEL PI IND STRL 7.5 (GLOVE) IMPLANT
GLOVE BIOGEL PI INDICATOR 6.5 (GLOVE) ×2
GLOVE BIOGEL PI INDICATOR 7.0 (GLOVE) ×6
GLOVE BIOGEL PI INDICATOR 7.5 (GLOVE) ×2
GLOVE SS BIOGEL STRL SZ 7 (GLOVE) ×1 IMPLANT
GLOVE SUPERSENSE BIOGEL SZ 7 (GLOVE) ×2
GOWN STRL REUS W/ TWL LRG LVL3 (GOWN DISPOSABLE) ×3 IMPLANT
GOWN STRL REUS W/ TWL XL LVL3 (GOWN DISPOSABLE) IMPLANT
GOWN STRL REUS W/TWL LRG LVL3 (GOWN DISPOSABLE) ×12
GOWN STRL REUS W/TWL XL LVL3 (GOWN DISPOSABLE) ×6
INSERT FOGARTY SM (MISCELLANEOUS) ×3 IMPLANT
KIT BASIN OR (CUSTOM PROCEDURE TRAY) ×3 IMPLANT
KIT ROOM TURNOVER OR (KITS) ×3 IMPLANT
NEEDLE 22X1 1/2 (OR ONLY) (NEEDLE) IMPLANT
NS IRRIG 1000ML POUR BTL (IV SOLUTION) ×6 IMPLANT
PACK CAROTID (CUSTOM PROCEDURE TRAY) ×3 IMPLANT
PAD ARMBOARD 7.5X6 YLW CONV (MISCELLANEOUS) ×6 IMPLANT
PATCH HEMASHIELD 8X150 (Vascular Products) ×2 IMPLANT
PROBE PENCIL 8 MHZ STRL DISP (MISCELLANEOUS) ×2 IMPLANT
SHUNT CAROTID BYPASS 12FRX15.5 (VASCULAR PRODUCTS) IMPLANT
SPONGE GAUZE 2X2 STER 10/PKG (GAUZE/BANDAGES/DRESSINGS) ×2
SUT ETHILON 3 0 PS 1 (SUTURE) ×2 IMPLANT
SUT PROLENE 6 0 C 1 30 (SUTURE) ×2 IMPLANT
SUT PROLENE 6 0 CC (SUTURE) ×5 IMPLANT
SUT SILK 2 0 FS (SUTURE) ×3 IMPLANT
SUT SILK 3 0 TIES 17X18 (SUTURE)
SUT SILK 3-0 18XBRD TIE BLK (SUTURE) IMPLANT
SUT VIC AB 2-0 CT1 27 (SUTURE) ×3
SUT VIC AB 2-0 CT1 TAPERPNT 27 (SUTURE) ×1 IMPLANT
SUT VIC AB 3-0 X1 27 (SUTURE) ×3 IMPLANT
SYR CONTROL 10ML LL (SYRINGE) IMPLANT
TAPE CLOTH SURG 4X10 WHT LF (GAUZE/BANDAGES/DRESSINGS) ×2 IMPLANT
TOWEL OR 17X24 6PK STRL BLUE (TOWEL DISPOSABLE) ×5 IMPLANT
TOWEL OR 17X26 10 PK STRL BLUE (TOWEL DISPOSABLE) ×3 IMPLANT
WATER STERILE IRR 1000ML POUR (IV SOLUTION) ×3 IMPLANT

## 2014-01-12 NOTE — Progress Notes (Signed)
Pt bp=95/52, patient is asymptomatic. Patient is alert and oriented. Gave 500cc NS bolus per md order. Will continue to monitor.

## 2014-01-12 NOTE — Progress Notes (Signed)
Utilization review completed.  

## 2014-01-12 NOTE — H&P (View-Only) (Signed)
Subjective:     Patient ID: Debbie [name], female   DOB: [birth year], 80 y.o.   MRN: [id number]  HPI this 80 year old female was referred by Dr. Janeice Robinson for evaluation of severe bilateral carotid occlusive disease. Patient has no history of stroke or TIA. She denies lateralizing weakness, achalasia, amaurosis fugax, diplopia, blurred vision, or syncope. She also denies chest pain or dyspnea on exertion. She has no cardiac history. Her biggest complaints are related to her right knee affecting her ambulation. She is not a smoker.  Past Medical History  Diagnosis Date  . Cancer dx'd 12/2007    endometroid adenocarcinoma  . Diabetes mellitus   . Hypertension   . History of radiation therapy 9/21,9/28,10/05,05/20/2008    4 txs 2400 cGy endometrial adenocarcinoma    History  Substance Use Topics  . Smoking status: Never Smoker   . Smokeless tobacco: Not on file  . Alcohol Use: No    Family History  Problem Relation Age of Onset  . Actinic keratosis Mother     Allergies  Allergen Reactions  . Codeine     Unusual mouth sensation    Current outpatient prescriptions:aspirin 81 MG tablet, Take 81 mg by mouth daily., Disp: , Rfl: ;  atenolol (TENORMIN) 100 MG tablet, Take 100 mg by mouth daily., Disp: , Rfl: ;  glimepiride (AMARYL) 4 MG tablet, Take 4 mg by mouth daily before breakfast., Disp: , Rfl: ;  lisinopril (PRINIVIL,ZESTRIL) 20 MG tablet, Take 20 mg by mouth daily., Disp: , Rfl: ;  lisinopril-hydrochlorothiazide (PRINZIDE,ZESTORETIC) 20-25 MG per tablet, , Disp: , Rfl:  Loratadine (CLARITIN) 10 MG CAPS, Take by mouth., Disp: , Rfl: ;  metFORMIN (GLUCOPHAGE) 1000 MG tablet, Take 1,000 mg by mouth 2 (two) times daily with a meal., Disp: , Rfl: ;  Multiple Vitamin (MULTIVITAMIN) tablet, Take 1 tablet by mouth daily. Centrum silver, Disp: , Rfl: ;  pioglitazone (ACTOS) 45 MG tablet, , Disp: , Rfl: ;  simvastatin (ZOCOR) 80 MG tablet, Take 80 mg by mouth at bedtime., Disp: , Rfl:    BP 134/82  Pulse 68  Resp 16  Ht 5\' 5"  (1.651 m)  Wt 190 lb (86.183 kg)  BMI 31.62 kg/m2  Body mass index is 31.62 kg/(m^2).           Review of Systems denies chest pain, dyspnea on exertion, PND, orthopnea, hemoptysis, claudication. Has severe right knee discomfort due to arthritis. Has diabetes mellitus controlled with oral medication. All other systems negative and a complete review of systems    Objective:   Physical Exam BP 134/82  Pulse 68  Resp 16  Ht 5\' 5"  (1.651 m)  Wt 190 lb (86.183 kg)  BMI 31.62 kg/m2  Gen.-alert and oriented x3 in no apparent distress HEENT normal for age Lungs no rhonchi or wheezing Cardiovascular regular rhythm no murmurs carotid pulses 3+ palpable bilateral harsh bruits. Abdomen soft nontender no palpable masses Musculoskeletal free of  major deformities Skin clear -no rashes Neurologic normal Lower extremities 3+ femoral and dorsalis pedis pulses palpable bilaterally with 1+ edema.  Today our carotid duplex exam which I reviewed and interpreted. I compared this to previous study done a few weeks ago. At agreed that she does have severe bilateral ICA stenosis left worse than right both in the 90% range in severity.        Assessment:     Bilateral severe ICA stenosis left greater than right-both approximating 90%-asymptomatic    Plan:  Plan left carotid endarterectomy on Thursday, August 6. Risks and benefits thoroughly discussed with patient and her son and she would like to proceed. She will then need right carotid endarterectomy in the near future.

## 2014-01-12 NOTE — Interval H&P Note (Signed)
History and Physical Interval Note:  01/12/2014 7:25 AM  Debbie [name]  has presented today for surgery, with the diagnosis of Occlusion and stenosis of carotid artery without mention of cerebral infarction  The various methods of treatment have been discussed with the patient and family. After consideration of risks, benefits and other options for treatment, the patient has consented to  Procedure(s): ENDARTERECTOMY CAROTID (Left) as a surgical intervention .  The patient's history has been reviewed, patient examined, no change in status, stable for surgery.  I have reviewed the patient's chart and labs.  Questions were answered to the patient's satisfaction.     Tinnie Gens

## 2014-01-12 NOTE — Progress Notes (Signed)
  [facility] Day of Surgery Note  Subjective:  Denies any pain. Having a scratchy throat. Family at the bedside. Per RN, blood pressures have been soft.  Filed Vitals:   01/12/14 1200  BP: 103/59  Pulse: 54  Temp:   Resp: 18    Incisions:  Left neck dressed. JP drain with 80 cc sanguinous output.  Neuro: No smile asymmetry. No tongue deviation. 5/5 grip strength bilaterally. Moving all extremities equally.    Assessment/Plan:  This is a 80 y.o. female who is s/p left carotid endarterectomy.   -Neuro intact. -Has voided. -Please record JP drain output. Will remove tomorrow.  -Blood pressure 97/47. Will monitor.  -Ambulate. -Anticipate discharge home tomorrow.    Virgina Jock, [place] Pager: [phone] 01/12/2014 12:35 PM

## 2014-01-12 NOTE — Progress Notes (Addendum)
Pt B/P= 95/74, started dopamine at 1745 3.5 mcg/kg/min. At 1800 B/P: 106/55 MAP(60), patient is resting, will continue to monitor

## 2014-01-12 NOTE — Progress Notes (Signed)
PHARMACIST - PHYSICIAN COMMUNICATION DR:  VVS CONCERNING:  METFORMIN SAFE ADMINISTRATION POLICY  RECOMMENDATION: Metformin has been placed on DISCONTINUE (rejected order) STATUS and should be reordered only after any of the conditions below are ruled out.  Current safety recommendations include avoiding metformin for a minimum of 48 hours after the patient's exposure to intravenous contrast media.  DESCRIPTION:  The Pharmacy Committee has adopted a policy that restricts the use of metformin in hospitalized patients until all the contraindications to administration have been ruled out. Specific contraindications are: []  Serum creatinine ? 1.5 for males [x]  Serum creatinine ? 1.4 for females []  Shock, acute MI, sepsis, hypoxemia, dehydration []  Planned administration of intravenous iodinated contrast media []  Heart Failure patients with low EF []  Acute or chronic metabolic acidosis (including DKA)     Dia Sitter, PharmD, BCPS

## 2014-01-12 NOTE — Progress Notes (Signed)
Pt BP 101/61 MAP (71), pt 100% on 2L 02 Via Nasal Cannula, tolerating bolus well. Bolus is currently still running. Etta Quill, RN

## 2014-01-12 NOTE — Transfer of Care (Signed)
Immediate Anesthesia Transfer of Care Note  Patient: Debbie [name]  Procedure(s) Performed: Procedure(s): LEFT CAROTID ARTERY ENDARTERECTOMY WITH HEMASHIELD PATCH ANGIOPLASTY (Left)  Patient Location: PACU  Anesthesia Type:General  Level of Consciousness: awake, alert  and oriented  Airway & Oxygen Therapy: Patient Spontanous Breathing and Patient connected to nasal cannula oxygen  Post-op Assessment: Report given to PACU RN and Post -op Vital signs reviewed and stable  Post vital signs: Reviewed and stable  Complications: No apparent anesthesia complications

## 2014-01-12 NOTE — Progress Notes (Signed)
Patient vomited 25 cc of brown emesis. Gave 4mg  iv zofran. Will continue to monitor.

## 2014-01-12 NOTE — Progress Notes (Signed)
     Alert and oriented x 3.  No weakness, no facial droop, and no tongue deviation.  Neuro intact. Drain in place.  Dressing C/D.   Hypotension 97/57 500 cc bolus given.  Stable asymptomatic.  HR 60 SAT 100% 2L [place].  Adysen Raphael MAUREEN PA-C

## 2014-01-12 NOTE — Anesthesia Preprocedure Evaluation (Addendum)
Anesthesia Evaluation  Patient identified by MRN, date of birth, ID band Patient awake    Reviewed: Allergy & Precautions, H&P , NPO status , Patient's Chart, lab work & pertinent test results  Airway       Dental   Pulmonary neg pulmonary ROS,          Cardiovascular hypertension, + Peripheral Vascular Disease     Neuro/Psych History noted. CE    GI/Hepatic negative GI ROS, Neg liver ROS,   Endo/Other  diabetes  Renal/GU negative Renal ROS     Musculoskeletal   Abdominal   Peds  Hematology   Anesthesia Other Findings   Reproductive/Obstetrics                          Anesthesia Physical Anesthesia Plan  ASA: III  Anesthesia Plan: General   Post-op Pain Management:    Induction: Intravenous  Airway Management Planned: Oral ETT  Additional Equipment: Arterial line  Intra-op Plan:   Post-operative Plan: Possible Post-op intubation/ventilation  Informed Consent: I have reviewed the patients History and Physical, chart, labs and discussed the procedure including the risks, benefits and alternatives for the proposed anesthesia with the patient or authorized representative who has indicated his/her understanding and acceptance.   Dental advisory given  Plan Discussed with: CRNA and Anesthesiologist  Anesthesia Plan Comments:         Anesthesia Quick Evaluation

## 2014-01-12 NOTE — Anesthesia Postprocedure Evaluation (Signed)
  Anesthesia Post-op Note  Patient: Debbie [name]  Procedure(s) Performed: Procedure(s): LEFT CAROTID ARTERY ENDARTERECTOMY WITH HEMASHIELD PATCH ANGIOPLASTY (Left)  Patient Location: PACU  Anesthesia Type:General  Level of Consciousness: awake  Airway and Oxygen Therapy: Patient Spontanous Breathing  Post-op Pain: mild  Post-op Assessment: Post-op Vital signs reviewed  Post-op Vital Signs: Reviewed  Last Vitals:  Filed Vitals:   01/12/14 1040  BP: 156/50  Pulse: 56  Temp:   Resp:     Complications: No apparent anesthesia complications

## 2014-01-12 NOTE — Progress Notes (Addendum)
Pt RR, and 02 dropped significantly o2 sats=79% on room air. Bp=84/49, patient  A&O x4, asymptomatic. Placed 4L 02 Via nasal cannula, started 558ml NS bolus per MD order. Will continue to monitor. Etta Quill, RN

## 2014-01-12 NOTE — Op Note (Signed)
OPERATIVE REPORT  Date of Surgery: 01/12/2014  Surgeon: Tinnie Gens, MD  Assistant: Dr. Gae Gallop  Pre-op Diagnosis: Bilateral severe ICA stenosis-asymptomatic Post-op Diagnosis: Same  Procedure: Procedure(s): LEFT CAROTID ARTERY ENDARTERECTOMY WITH HEMASHIELD PATCH ANGIOPLASTY  Anesthesia: General  EBL: 150 cc Drain one Jackson-Pratt  Complications: None  Procedure Details: The patient was taken to the operating room and placed in the supine position. Following induction of satisfactory general endotracheal anesthesia the left neck was prepped and draped in a routine sterile manner. Incision was made on the anterior border of the sternocleidomastoid muscle and carried down through the subcutaneous tissue and platysma using the Bovie. Care was taken not to injure the hypoglossal nerve.. The common internal and external carotid arteries were dissected free. There was a calcified atherosclerotic plaque at the carotid bifurcation extending up the internal carotid artery. The plaque extended down the common carotid artery below the crossing of the omohyoid muscle. A #10 shunt was then prepared and the patient was heparinized. The carotid vessels were occluded with vascular clamps. A longitudinal opening was made in the common carotid with a 15 blade extended up the internal carotid with the Potts scissors to a point distal to the disease. The plaque was approximately 90 % stenotic in severity. The distal vessel appeared normal. Shunt was inserted without difficulty reestablishing flow in about 2 minutes. A standard endarterectomy was performed with an eversion endarterectomy of the external carotid. A very long arthrotomy was made to remove the plaque from the common carotid artery. The plaque feathered off  the distal internal carotid artery nicely not requiring any tacking sutures. The lumen was thoroughly irrigated with heparinized saline and loose debris all carefully removed. The arterotomy  was then closed with a patch using continuous 6-0 Prolene. Prior to completion of the  Closure the  shunt was removed after approximately 50 minutes of shunt time. Flow was then reestablished up the external branch initially followed by the internal branch. Protamine was given to her reverse the heparin. Because of some persistent mild using one Jackson-Pratt drain was brought out through an inferiorly based stab wound secured with a nylon suture..Following adequate hemostasis the wound was irrigated with saline and closed in layers with Vicryl ain a subcuticular fashion. Sterile dressing was applied and the patient taken to the recovery room in stable condition.  Tinnie Gens, MD 01/12/2014 10:19 AM

## 2014-01-13 ENCOUNTER — Encounter (HOSPITAL_COMMUNITY): Payer: Self-pay | Admitting: Vascular Surgery

## 2014-01-13 LAB — CBC
HCT: 31.8 % — ABNORMAL LOW (ref 36.0–46.0)
Hemoglobin: 10.5 g/dL — ABNORMAL LOW (ref 12.0–15.0)
MCH: 29.7 pg (ref 26.0–34.0)
MCHC: 33 g/dL (ref 30.0–36.0)
MCV: 89.8 fL (ref 78.0–100.0)
PLATELETS: 245 10*3/uL (ref 150–400)
RBC: 3.54 MIL/uL — ABNORMAL LOW (ref 3.87–5.11)
RDW: 14.2 % (ref 11.5–15.5)
WBC: 11.4 10*3/uL — AB (ref 4.0–10.5)

## 2014-01-13 LAB — TYPE AND SCREEN
ABO/RH(D): O POS
ANTIBODY SCREEN: NEGATIVE
UNIT DIVISION: 0
Unit division: 0

## 2014-01-13 LAB — BASIC METABOLIC PANEL
ANION GAP: 14 (ref 5–15)
BUN: 30 mg/dL — ABNORMAL HIGH (ref 6–23)
CHLORIDE: 102 meq/L (ref 96–112)
CO2: 24 mEq/L (ref 19–32)
CREATININE: 1.2 mg/dL — AB (ref 0.50–1.10)
Calcium: 9 mg/dL (ref 8.4–10.5)
GFR calc Af Amer: 48 mL/min — ABNORMAL LOW (ref >90)
GFR calc non Af Amer: 41 mL/min — ABNORMAL LOW (ref >90)
Glucose, Bld: 159 mg/dL — ABNORMAL HIGH (ref 70–99)
Potassium: 4.1 mEq/L (ref 3.7–5.3)
SODIUM: 140 meq/L (ref 137–147)

## 2014-01-13 LAB — GLUCOSE, CAPILLARY: Glucose-Capillary: 169 mg/dL — ABNORMAL HIGH (ref 70–99)

## 2014-01-13 MED ORDER — OXYCODONE HCL 5 MG PO TABS: 5.0000 mg | ORAL_TABLET | ORAL | Status: DC | PRN

## 2014-01-13 NOTE — Evaluation (Signed)
Physical Therapy Evaluation Patient Details Name: Debbie [name] MRN: [id number] DOB: [birth year] Today's Date: 01/13/2014   History of Present Illness  Patient is an 80 yo female s/p LEFT CAROTID ARTERY ENDARTERECTOMY WITH HEMASHIELD PATCH ANGIOPLASTY.   Clinical Impression  Patient demonstrates deficits in functional mobility as indicated below. Will need continued skilled PT to address deficits and maximize function. Will see as indicated and progress as tolerated. Recommend HHPT upon discharge.    Follow Up Recommendations Home health PT;Supervision - Intermittent    Equipment Recommendations  Rolling walker with 5" wheels;3in1 (PT)    Recommendations for Other Services       Precautions / Restrictions Precautions Precautions: Fall Restrictions Weight Bearing Restrictions: No      Mobility  Bed Mobility               General bed mobility comments: not assessed, reports that she needed help to get OOB this am  Transfers Overall transfer level: Needs assistance Equipment used: Rolling walker (2 wheeled) Transfers: Sit to/from Stand Sit to Stand: Min assist;+2 physical assistance         General transfer comment: VCs for hand placement and safety, assist for stability  Ambulation/Gait Ambulation/Gait assistance: Min guard;Min assist Ambulation Distance (Feet): 210 Feet (110 ft without device) Assistive device: Rolling walker (2 wheeled);None Gait Pattern/deviations: Step-through pattern;Decreased stride length;Drifts right/left Gait velocity: decreased Gait velocity interpretation: Below normal speed for age/gender General Gait Details: VCs for increased cadence and stride gfor stability, min assist for stability without device  Stairs            Wheelchair Mobility    Modified Rankin (Stroke Patients Only)       Balance                                             Pertinent Vitals/Pain Pain Assessment: No/denies pain     Home Living Family/patient expects to be discharged to:: Private residence Living Arrangements: Children (son) Available Help at Discharge: Family Type of Home: House Home Access: Stairs to enter Entrance Stairs-Rails: Can reach both Entrance Stairs-Number of Steps: 2 Home Layout: One level Home Equipment: Cane - single point      Prior Function Level of Independence: Independent         Comments: occassional use of cane in the morning "to protect from dogs"     Hand Dominance   Dominant Hand: Right    Extremity/Trunk Assessment               Lower Extremity Assessment: Generalized weakness         Communication   Communication:  (reading glasses)  Cognition Arousal/Alertness: Awake/alert Behavior During Therapy: WFL for tasks assessed/performed Overall Cognitive Status: Within Functional Limits for tasks assessed                      General Comments      Exercises        Assessment/Plan    PT Assessment Patient needs continued PT services  PT Diagnosis Difficulty walking;Abnormality of gait;Generalized weakness   PT Problem List Decreased strength;Decreased range of motion;Decreased activity tolerance;Decreased balance;Decreased mobility;Decreased safety awareness  PT Treatment Interventions DME instruction;Gait training;Stair training;Functional mobility training;Therapeutic activities;Therapeutic exercise;Balance training;Patient/family education   PT Goals (Current goals can be found in the Care Plan section) Acute Rehab PT Goals Patient Stated  Goal: to go home PT Goal Formulation: With patient/family Time For Goal Achievement: 01/27/14 Potential to Achieve Goals: Good    Frequency Min 3X/week   Barriers to discharge        Co-evaluation               End of Session Equipment Utilized During Treatment: Gait belt Activity Tolerance: Patient tolerated treatment well Patient left: in chair;with call bell/phone within  reach;with family/visitor present Nurse Communication: Mobility status         Time: 8592-9244 PT Time Calculation (min): 19 min   Charges:   PT Evaluation $Initial PT Evaluation Tier I: 1 Procedure PT Treatments $Gait Training: 8-22 mins   PT G CodesDuncan Dull 01/13/2014, 9:38 AM  Alben Deeds, PT DPT  [phone]

## 2014-01-13 NOTE — Care Management Note (Signed)
    Page 1 of 2   01/13/2014     12:03:19 PM CARE MANAGEMENT NOTE 01/13/2014  Patient:  Debbie [name], Debbie [name]   Account Number:  [id number]  Date Initiated:  01/13/2014  Documentation initiated by:  Marvetta Gibbons  Subjective/Objective Assessment:   Pt admitted s/p carotid     Action/Plan:   PTA pt lived at home- plan to return home   Anticipated DC Date:  01/13/2014   Anticipated DC Plan:  [place]  CM consult      [place]   Choice offered to / List presented to:  C-1 Patient   DME arranged  3-N-1  Vassie Moselle      DME agency  [place] arranged  [place].   Status of service:  Completed, signed off Medicare Important Message given?  NA - LOS <3 / Initial given by admissions (If response is "NO", the following Medicare IM given date fields will be blank) Date Medicare IM given:   Medicare IM given by:   Date Additional Medicare IM given:   Additional Medicare IM given by:    Discharge Disposition:  [place]  Per UR Regulation:  Reviewed for med. necessity/level of care/duration of stay  If discussed at [place] of Stay Meetings, dates discussed:    Comments:  01/13/14- 1030- Marvetta Gibbons RN, BSN [phone] Pt for d/c today- order for DME- RW, 3n1- spoke with pt and son at bedside- who both agree pt needs DME- call made to [facility] with [facility] - equipment to be delivered to room prior to discharge- PT also recommended [facility]- pt and son agreeable to this also - have used [facility] in past- would like to use them again for services- referral called to Butch Penny with [facility] - for HH-PT/OT- services to begin within 24-48 hr post discharge.

## 2014-01-13 NOTE — Progress Notes (Signed)
Pt d/c home per MD order, pt VSS, BP stable when taken on LLE, pls see documentation, d/c instructions given, [facility] products given, all questions answered

## 2014-01-13 NOTE — Discharge Summary (Signed)
[facility] Discharge Summary  Debbie [name] 1930-07-26 80 y.o. female  [id number]  Admission Date: 01/12/2014  Discharge Date: 01/13/2014  Physician: Tinnie Gens, MD  Admission Diagnosis: Occlusion and stenosis of carotid artery without mention of cerebral infarction   HPI:   This is a 80 y.o. female who was referred by Dr. Janeice Robinson for evaluation of severe bilateral carotid occlusive disease. Patient has no history of stroke or TIA. She denies lateralizing weakness, achalasia, amaurosis fugax, diplopia, blurred vision, or syncope. She also denies chest pain or dyspnea on exertion. She has no cardiac history. Her biggest complaints are related to her right knee affecting her ambulation. She is not a smoker.   Hospital Course:  The patient was admitted to the hospital and taken to the operating room on 01/12/2014 and underwent left carotid endarterectomy.  The patient tolerated the procedure well and was transported to the PACU in stable condition. She was then transferred to the stepdown unit. She experienced hypotension and required dopamine after two NS boluses were administered. She was asymptomatic and neurologically intact.   By POD 1, the patient's neuro status was intact. Her incision was clean and intact. Her JP drain was discontinued. She was able to ambulate well with a rolling walker and increase intake of solids without difficulty. She was discharged on POD 1 in good condition. She will follow up in two weeks with Dr. Kellie Simmering.     Recent Labs  01/11/14 1217 01/13/14 0334  NA 140 140  K 4.7 4.1  CL 101 102  CO2 25 24  GLUCOSE 144* 159*  BUN 36* 30*  CALCIUM 9.5 9.0    Recent Labs  01/12/14 1500 01/13/14 0334  WBC 10.6* 11.4*  HGB 9.7* 10.5*  HCT 29.5* 31.8*  PLT 213 245    Recent Labs  01/11/14 1217  INR 0.96    Discharge Instructions:   The patient is discharged to home with extensive instructions on wound care and  progressive ambulation.  They are instructed not to drive or perform any heavy lifting until returning to see the physician in his office.  Discharge Instructions   CAROTID Sugery: Call MD for difficulty swallowing or speaking; weakness in arms or legs that is a new symtom; severe headache.  If you have increased swelling in the neck and/or  are having difficulty breathing, CALL [phone]    Complete by:  As directed      Call MD for:  redness, tenderness, or signs of infection (pain, swelling, bleeding, redness, odor or green/yellow discharge around incision site)    Complete by:  As directed      Call MD for:  severe or increased pain, loss or decreased feeling  in affected limb(s)    Complete by:  As directed      Call MD for:  temperature >100.5    Complete by:  As directed      Driving Restrictions    Complete by:  As directed   No driving for 2 weeks     Lifting restrictions    Complete by:  As directed   No lifting for 2 weeks     Resume previous diet    Complete by:  As directed      may wash over wound with mild soap and water    Complete by:  As directed            Discharge Diagnosis:  Occlusion and stenosis of carotid artery without mention  of cerebral infarction  Secondary Diagnosis: Patient Active Problem List   Diagnosis Date Noted  . Carotid stenosis 01/10/2014  . Occlusion and stenosis of carotid artery without mention of cerebral infarction 01/10/2014  . Malignant neoplasm of corpus uteri, except isthmus 07/07/2011   Past Medical History  Diagnosis Date  . Cancer dx'd 12/2007    endometroid adenocarcinoma  . Diabetes mellitus   . Hypertension   . History of radiation therapy 9/21,9/28,10/05,05/20/2008    4 txs 2400 cGy endometrial adenocarcinoma  . Peripheral vascular disease   . Seasonal allergies   . Arthritis       Medication List         aspirin EC 81 MG tablet  Take 81 mg by mouth daily.     atenolol 100 MG tablet  Commonly known as:  TENORMIN    Take 100 mg by mouth daily.     CLARITIN 10 MG Caps  Generic drug:  Loratadine  Take 10 mg by mouth daily as needed (for allergies).     lisinopril-hydrochlorothiazide 20-25 MG per tablet  Commonly known as:  PRINZIDE,ZESTORETIC  Take 1 tablet by mouth daily.     metFORMIN 1000 MG tablet  Commonly known as:  GLUCOPHAGE  Take 1,000 mg by mouth 2 (two) times daily with a meal.     multivitamin tablet  Take 1 tablet by mouth daily. Centrum silver     oxyCODONE 5 MG immediate release tablet  Commonly known as:  Oxy IR/ROXICODONE  Take 1-2 tablets (5-10 mg total) by mouth every 4 (four) hours as needed for moderate pain.     pioglitazone 45 MG tablet  Commonly known as:  ACTOS  Take 45 mg by mouth daily.     simvastatin 80 MG tablet  Commonly known as:  ZOCOR  Take 80 mg by mouth at bedtime.        Roxicodone #20 No Refill  Disposition: Home  Patient's condition: is Good  Follow up: 1. Dr.  Kellie Simmering in 2 weeks.   Virgina Jock, PA-C [facility] [phone]  --- For [facility] use --- Instructions: Press F2 to tab through selections.  Delete question if not applicable.   Modified Rankin score at D/C (0-6): 0  IV medication needed for:  1. Hypertension: No 2. Hypotension: Yes  Post-op Complications: No  1. Post-op CVA or TIA: No  2. CN injury: No  3. Myocardial infarction: No  4.  CHF: No  5.  Dysrhythmia (new): No  6. Wound infection: No  7. Reperfusion symptoms: No  8. Return to OR: No  Discharge medications: Statin use:  Yes If No: [ ]  For Medical reasons, [ ]  Non-compliant, [ ]  Not-indicated ASA use:  Yes  If No: [ ]  For Medical reasons, [ ]  Non-compliant, [ ]  Not-indicated Beta blocker use:  Yes If No: [ ]  For Medical reasons, [ ]  Non-compliant, [ ]  Not-indicated ACE-Inhibitor use:  Yes If No: [ ]  For Medical reasons, [ ]  Non-compliant, [ ]  Not-indicated P2Y12 Antagonist use: No, [ ]  Plavix, [ ]  Plasugrel, [ ]   Ticlopinine, [ ]  Ticagrelor, [ ]  Other, [ ]  No for medical reason, [ ]  Non-compliant, [ x] Not-indicated Anti-coagulant use:  No, [ ]  Warfarin, [ ]  Rivaroxaban, [ ]  Dabigatran, [ ]  Other, [ ]  No for medical reason, [ ]  Non-compliant, [x ] Not-indicated

## 2014-01-13 NOTE — Progress Notes (Signed)
Patient ID: Debbie [name], female   DOB: 1930/09/27, 80 y.o.   MRN: [id number] Vascular Surgery Progress Note  Subjective: One day post left carotid endarterectomy for severe bilateral asymptomatic ICA stenosis. No specific complaints today. Patient does have mild hoarseness. Denies weakness in either upper or lower extremity and swallowing well.  Objective:  Filed Vitals:   01/13/14 0800  BP: 86/63  Pulse: 63  Temp: 98.6 F (37 C)  Resp: 12    General alert and oriented x3 Left neck incision looks good JP drainage minimal at present time Neurologic exam normal   Labs:  Recent Labs Lab 01/11/14 1217 01/12/14 1500 01/13/14 0334  CREATININE 1.54* 1.31* 1.20*    Recent Labs Lab 01/11/14 1217 01/12/14 1500 01/13/14 0334  NA 140  --  140  K 4.7  --  4.1  CL 101  --  102  CO2 25  --  24  BUN 36*  --  30*  CREATININE 1.54* 1.31* 1.20*  GLUCOSE 144*  --  159*  CALCIUM 9.5  --  9.0    Recent Labs Lab 01/11/14 1217 01/12/14 1500 01/13/14 0334  WBC 8.0 10.6* 11.4*  HGB 12.8 9.7* 10.5*  HCT 40.3 29.5* 31.8*  PLT 272 213 245    Recent Labs Lab 01/11/14 1217  INR 0.96    I/O last 3 completed shifts: In: 2895.6 [P.O.:480; I.V.:2415.6] Out: 1430 [Urine:1210; Drains:120; Blood:100]  Imaging: Dg Chest 2 View  01/11/2014   CLINICAL DATA:  Preop.  Carotid endarterectomy.  EXAM: CHEST  2 VIEW  COMPARISON:  12/17/2007  FINDINGS: Moderate cardiomegaly. Normal vascularity. No pleural effusion. No pneumothorax. Hyperaeration. Clear lungs.  IMPRESSION: Moderate cardiomegaly without decompensation.   Electronically Signed   By: Maryclare Bean M.D.   On: 01/11/2014 13:36    Assessment/Plan:  POD #1  LOS: 1 day  s/p Procedure(s): LEFT CAROTID ARTERY ENDARTERECTOMY WITH HEMASHIELD PATCH ANGIOPLASTY  Patient doing well 1 day post left carotid endarterectomy with patch angioplasty.. Minimal hoarseness. Swallowing well. Patient required some dopamine during night to sustain  blood pressure greater than 100. Discontinuing that now.  Plan ambulate today and discharge home this a.m. Return to see me in office in 2 weeks  Tinnie Gens, MD 01/13/2014 9:31 AM

## 2014-01-17 ENCOUNTER — Telehealth: Payer: Self-pay | Admitting: Vascular Surgery

## 2014-01-17 ENCOUNTER — Telehealth: Payer: Self-pay | Admitting: Oncology

## 2014-01-17 NOTE — Telephone Encounter (Signed)
Called Phyillis to inform her of the good results on her pap smear per Dr. Sondra Come.  She verbalized understanding.

## 2014-01-17 NOTE — Telephone Encounter (Addendum)
Message copied by Gena Fray on Tue Jan 17, 2014  2:04 PM ------      Message from: Mena Goes      Created: Fri Jan 13, 2014 10:58 AM      Regarding: Schedule                   ----- Message -----         From: Alvia Grove, PA-C         Sent: 01/13/2014   9:33 AM           To: [facility] Charge Pool            S/p left carotid endarterectomy 01/12/14            F/u with Dr. Kellie Simmering in 2 weeks.            Thanks,       Kim ------  01/17/14: spoke with pts son to schedule, dpm

## 2014-01-30 ENCOUNTER — Encounter: Payer: Self-pay | Admitting: Vascular Surgery

## 2014-01-31 ENCOUNTER — Ambulatory Visit (INDEPENDENT_AMBULATORY_CARE_PROVIDER_SITE_OTHER): Payer: Self-pay | Admitting: Vascular Surgery

## 2014-01-31 ENCOUNTER — Other Ambulatory Visit: Payer: Self-pay | Admitting: *Deleted

## 2014-01-31 ENCOUNTER — Encounter: Payer: Self-pay | Admitting: Vascular Surgery

## 2014-01-31 VITALS — BP 110/78 | HR 130 | Temp 98.7°F | Resp 16 | Ht 65.0 in | Wt 185.0 lb

## 2014-01-31 DIAGNOSIS — I6529 Occlusion and stenosis of unspecified carotid artery: Secondary | ICD-10-CM

## 2014-01-31 DIAGNOSIS — Z48812 Encounter for surgical aftercare following surgery on the circulatory system: Secondary | ICD-10-CM | POA: Insufficient documentation

## 2014-01-31 NOTE — Progress Notes (Signed)
Subjective:     Patient ID: Debbie [name], female   DOB: 1930/11/16, 80 y.o.   MRN: [id number]  HPI this 80 year old female returns for initial followup regarding her left carotid endarterectomy which I performed about 3 weeks ago for severe left ICA stenosis combined with a severe right ICA stenosis. She has done well from a neurologic standpoint. Her son manages her medications and he has been checking her blood pressure in the left arm. She does have left subclavian occlusive disease and her blood pressure in the left arm is about 60 mm less than the right arm. He stopped her atenolol about 2 weeks ago. She denies any neurologic symptoms.    Review of Systems     Objective:   Physical Exam BP 110/78  Pulse 130  Temp(Src) 98.7 F (37.1 C) (Oral)  Resp 16  Ht 5\' 5"  (1.651 m)  Wt 185 lb (83.915 kg)  BMI 30.79 kg/m2  SpO2 98%  Blood pressure is 172/80 in the right upper extremity and 110/78 in the left upper extremity. General alert and oriented x3 Left neck incision is healed nicely with 3+ pulse and no grossly audible Neurologic exam normal      Assessment:     Doing well post left carotid endarterectomy with tachycardia and increased blood pressure because of patient not taking atenolol    Plan:     #1 resume atenolol today #2 call Dr. Randel Books office today to arrange appointment as soon as possibl    #3 have scheduled right carotid endarterectomy for Friday, October 9-risks and benefits thoroughly discussed with patient and her son and they would like to proceed

## 2014-02-01 ENCOUNTER — Other Ambulatory Visit: Payer: Self-pay

## 2014-02-02 ENCOUNTER — Other Ambulatory Visit: Payer: Self-pay

## 2014-03-07 ENCOUNTER — Encounter (HOSPITAL_COMMUNITY): Payer: Self-pay

## 2014-03-07 ENCOUNTER — Encounter (HOSPITAL_COMMUNITY)
Admission: RE | Admit: 2014-03-07 | Discharge: 2014-03-07 | Disposition: A | Discharge status: Home / Self Care | Payer: Medicare HMO | Source: Ambulatory Visit | Attending: Vascular Surgery | Admitting: Vascular Surgery

## 2014-03-07 DIAGNOSIS — Z01812 Encounter for preprocedural laboratory examination: Secondary | ICD-10-CM | POA: Diagnosis present

## 2014-03-07 DIAGNOSIS — I6529 Occlusion and stenosis of unspecified carotid artery: Secondary | ICD-10-CM | POA: Diagnosis not present

## 2014-03-07 HISTORY — DX: Dizziness and giddiness: R42

## 2014-03-07 LAB — URINALYSIS, ROUTINE W REFLEX MICROSCOPIC
Bilirubin Urine: NEGATIVE
GLUCOSE, UA: NEGATIVE mg/dL
Ketones, ur: NEGATIVE mg/dL
Nitrite: NEGATIVE
PH: 6.5 (ref 5.0–8.0)
Protein, ur: 100 mg/dL — AB
Specific Gravity, Urine: 1.013 (ref 1.005–1.030)
Urobilinogen, UA: 1 mg/dL (ref 0.0–1.0)

## 2014-03-07 LAB — COMPREHENSIVE METABOLIC PANEL
ALT: 26 U/L (ref 0–35)
ANION GAP: 16 — AB (ref 5–15)
AST: 27 U/L (ref 0–37)
Albumin: 3.5 g/dL (ref 3.5–5.2)
Alkaline Phosphatase: 61 U/L (ref 39–117)
BUN: 28 mg/dL — AB (ref 6–23)
CALCIUM: 9.8 mg/dL (ref 8.4–10.5)
CO2: 24 mEq/L (ref 19–32)
CREATININE: 1.37 mg/dL — AB (ref 0.50–1.10)
Chloride: 101 mEq/L (ref 96–112)
GFR calc non Af Amer: 35 mL/min — ABNORMAL LOW (ref >90)
GFR, EST AFRICAN AMERICAN: 41 mL/min — AB (ref >90)
GLUCOSE: 118 mg/dL — AB (ref 70–99)
Potassium: 4.3 mEq/L (ref 3.7–5.3)
Sodium: 141 mEq/L (ref 137–147)
TOTAL PROTEIN: 7.3 g/dL (ref 6.0–8.3)
Total Bilirubin: 0.4 mg/dL (ref 0.3–1.2)

## 2014-03-07 LAB — URINE MICROSCOPIC-ADD ON

## 2014-03-07 LAB — PROTIME-INR
INR: 0.94 (ref 0.00–1.49)
PROTHROMBIN TIME: 12.6 s (ref 11.6–15.2)

## 2014-03-07 LAB — CBC
HEMATOCRIT: 36.4 % (ref 36.0–46.0)
HEMOGLOBIN: 11.6 g/dL — AB (ref 12.0–15.0)
MCH: 28.9 pg (ref 26.0–34.0)
MCHC: 31.9 g/dL (ref 30.0–36.0)
MCV: 90.5 fL (ref 78.0–100.0)
Platelets: 380 10*3/uL (ref 150–400)
RBC: 4.02 MIL/uL (ref 3.87–5.11)
RDW: 14.3 % (ref 11.5–15.5)
WBC: 10.3 10*3/uL (ref 4.0–10.5)

## 2014-03-07 LAB — APTT: aPTT: 27 seconds (ref 24–37)

## 2014-03-07 LAB — SURGICAL PCR SCREEN
MRSA, PCR: NEGATIVE
Staphylococcus aureus: NEGATIVE

## 2014-03-07 LAB — PREPARE RBC (CROSSMATCH)

## 2014-03-07 NOTE — Progress Notes (Signed)
Primary - dr. [name] No cardiologist ekg in epic from august 2015

## 2014-03-07 NOTE — Pre-Procedure Instructions (Signed)
ATALIA LITZINGER  03/07/2014   Your procedure is scheduled on:  Wednesday, October 9th  Report to [facility] Admitting at 530 AM.  Call this number if you have problems the morning of surgery: [phone]   Remember:   Do not eat food or drink liquids after midnight.   Take these medicines the morning of surgery with A SIP OF WATER: Atenolol, aspirin, oxycodone if needed   Do not wear jewelry, make-up or nail polish.  Do not wear lotions, powders, or perfumes. You may wear deodorant.  Do not shave 48 hours prior to surgery. Men may shave face and neck.  Do not bring valuables to the hospital.  [facility] is not responsible for any belongings or valuables.               Contacts, dentures or bridgework may not be worn into surgery.  Leave suitcase in the car. After surgery it may be brought to your room.  For patients admitted to the hospital, discharge time is determined by your  treatment team.        Please read over the following fact sheets that you were given: Pain Booklet, Coughing and Deep Breathing, Blood Transfusion Information, MRSA Information and Surgical Site Infection Prevention [place] - Preparing for Surgery  Before surgery, you can play an important role.  Because skin is not sterile, your skin needs to be as free of germs as possible.  You can reduce the number of germs on you skin by washing with CHG (chlorahexidine gluconate) soap before surgery.  CHG is an antiseptic cleaner which kills germs and bonds with the skin to continue killing germs even after washing.  Please DO NOT use if you have an allergy to CHG or antibacterial soaps.  If your skin becomes reddened/irritated stop using the CHG and inform your nurse when you arrive at Short Stay.  Do not shave (including legs and underarms) for at least 48 hours prior to the first CHG shower.  You may shave your face.  Please follow these instructions carefully:   1.  Shower with CHG Soap the night  before surgery and the morning of Surgery.  2.  If you choose to wash your hair, wash your hair first as usual with your normal shampoo.  3.  After you shampoo, rinse your hair and body thoroughly to remove the shampoo.  4.  Use CHG as you would any other liquid soap.  You can apply CHG directly to the skin and wash gently with scrungie or a clean washcloth.  5.  Apply the CHG Soap to your body ONLY FROM THE NECK DOWN.  Do not use on open wounds or open sores.  Avoid contact with your eyes, ears, mouth and genitals (private parts).  Wash genitals (private parts) with your normal soap.  6.  Wash thoroughly, paying special attention to the area where your surgery will be performed.  7.  Thoroughly rinse your body with warm water from the neck down.  8.  DO NOT shower/wash with your normal soap after using and rinsing off the CHG Soap.  9.  Pat yourself dry with a clean towel.            10.  Wear clean pajamas.            11.  Place clean sheets on your bed the night of your first shower and do not sleep with pets.  Day of Surgery  Do not  apply any lotions/deoderants the morning of surgery.  Please wear clean clothes to the hospital/surgery center.

## 2014-03-16 MED ORDER — DEXTROSE 5 % IV SOLN
1.5000 g | INTRAVENOUS | Status: AC
  Administered 2014-03-17: 1.5 g via INTRAVENOUS
  Filled 2014-03-16: qty 1.5

## 2014-03-17 ENCOUNTER — Encounter (HOSPITAL_COMMUNITY): Payer: Self-pay | Admitting: Anesthesiology

## 2014-03-17 ENCOUNTER — Encounter (HOSPITAL_COMMUNITY)
Admission: RE | Disposition: A | Discharge status: Home / Self Care | Payer: Self-pay | Source: Ambulatory Visit | Attending: Vascular Surgery

## 2014-03-17 ENCOUNTER — Encounter (HOSPITAL_COMMUNITY): Payer: Medicare HMO | Admitting: Anesthesiology

## 2014-03-17 ENCOUNTER — Inpatient Hospital Stay (HOSPITAL_COMMUNITY): Payer: Medicare HMO | Admitting: Anesthesiology

## 2014-03-17 ENCOUNTER — Inpatient Hospital Stay (HOSPITAL_COMMUNITY)
Admission: RE | Admit: 2014-03-17 | Discharge: 2014-03-18 | DRG: 039 | Disposition: A | Discharge status: Home / Self Care | Payer: Medicare HMO | Source: Ambulatory Visit | Attending: Vascular Surgery | Admitting: Vascular Surgery

## 2014-03-17 DIAGNOSIS — E119 Type 2 diabetes mellitus without complications: Secondary | ICD-10-CM | POA: Diagnosis present

## 2014-03-17 DIAGNOSIS — I6521 Occlusion and stenosis of right carotid artery: Secondary | ICD-10-CM | POA: Diagnosis present

## 2014-03-17 DIAGNOSIS — Z923 Personal history of irradiation: Secondary | ICD-10-CM | POA: Diagnosis not present

## 2014-03-17 DIAGNOSIS — I10 Essential (primary) hypertension: Secondary | ICD-10-CM | POA: Diagnosis present

## 2014-03-17 DIAGNOSIS — Z7982 Long term (current) use of aspirin: Secondary | ICD-10-CM | POA: Diagnosis not present

## 2014-03-17 DIAGNOSIS — Z8542 Personal history of malignant neoplasm of other parts of uterus: Secondary | ICD-10-CM

## 2014-03-17 DIAGNOSIS — I6529 Occlusion and stenosis of unspecified carotid artery: Secondary | ICD-10-CM | POA: Diagnosis present

## 2014-03-17 DIAGNOSIS — Z79899 Other long term (current) drug therapy: Secondary | ICD-10-CM | POA: Diagnosis not present

## 2014-03-17 HISTORY — PX: ENDARTERECTOMY: SHX5162

## 2014-03-17 LAB — GLUCOSE, CAPILLARY
GLUCOSE-CAPILLARY: 186 mg/dL — AB (ref 70–99)
Glucose-Capillary: 174 mg/dL — ABNORMAL HIGH (ref 70–99)
Glucose-Capillary: 208 mg/dL — ABNORMAL HIGH (ref 70–99)

## 2014-03-17 SURGERY — ENDARTERECTOMY, CAROTID
Anesthesia: General | Site: Neck | Laterality: Right

## 2014-03-17 MED ORDER — LABETALOL HCL 5 MG/ML IV SOLN
INTRAVENOUS | Status: AC
  Administered 2014-03-17: 10 mg via INTRAVENOUS
  Filled 2014-03-17: qty 4

## 2014-03-17 MED ORDER — GLYCOPYRROLATE 0.2 MG/ML IJ SOLN
INTRAMUSCULAR | Status: DC | PRN
  Administered 2014-03-17: 0.6 mg via INTRAVENOUS

## 2014-03-17 MED ORDER — ATORVASTATIN CALCIUM 40 MG PO TABS
40.0000 mg | ORAL_TABLET | Freq: Every day | ORAL | Status: DC
  Administered 2014-03-17: 40 mg via ORAL
  Filled 2014-03-17 (×2): qty 1

## 2014-03-17 MED ORDER — DOCUSATE SODIUM 100 MG PO CAPS
100.0000 mg | ORAL_CAPSULE | Freq: Every day | ORAL | Status: DC
  Filled 2014-03-17: qty 1

## 2014-03-17 MED ORDER — LIDOCAINE HCL 4 % MT SOLN
OROMUCOSAL | Status: DC | PRN
  Administered 2014-03-17: 4 mL via TOPICAL

## 2014-03-17 MED ORDER — HYDRALAZINE HCL 25 MG PO TABS
25.0000 mg | ORAL_TABLET | Freq: Two times a day (BID) | ORAL | Status: DC
  Administered 2014-03-17 – 2014-03-18 (×2): 25 mg via ORAL
  Filled 2014-03-17 (×3): qty 1

## 2014-03-17 MED ORDER — SENNOSIDES-DOCUSATE SODIUM 8.6-50 MG PO TABS
1.0000 | ORAL_TABLET | Freq: Every evening | ORAL | Status: DC | PRN
  Filled 2014-03-17: qty 1

## 2014-03-17 MED ORDER — FENTANYL CITRATE 0.05 MG/ML IJ SOLN
INTRAMUSCULAR | Status: DC | PRN
  Administered 2014-03-17 (×2): 50 ug via INTRAVENOUS
  Administered 2014-03-17: 100 ug via INTRAVENOUS
  Administered 2014-03-17: 50 ug via INTRAVENOUS

## 2014-03-17 MED ORDER — MIDAZOLAM HCL 2 MG/2ML IJ SOLN
INTRAMUSCULAR | Status: AC
  Filled 2014-03-17: qty 2

## 2014-03-17 MED ORDER — ESMOLOL HCL 10 MG/ML IV SOLN
INTRAVENOUS | Status: DC | PRN
  Administered 2014-03-17 (×2): 50 mg via INTRAVENOUS

## 2014-03-17 MED ORDER — ONDANSETRON HCL 4 MG/2ML IJ SOLN: 4.0000 mg | Freq: Once | INTRAMUSCULAR | Status: DC | PRN

## 2014-03-17 MED ORDER — PHENYLEPHRINE 40 MCG/ML (10ML) SYRINGE FOR IV PUSH (FOR BLOOD PRESSURE SUPPORT)
PREFILLED_SYRINGE | INTRAVENOUS | Status: AC
  Filled 2014-03-17: qty 10

## 2014-03-17 MED ORDER — MORPHINE SULFATE 2 MG/ML IJ SOLN: 2.0000 mg | INTRAMUSCULAR | Status: DC | PRN

## 2014-03-17 MED ORDER — PROTAMINE SULFATE 10 MG/ML IV SOLN
INTRAVENOUS | Status: AC
  Filled 2014-03-17: qty 5

## 2014-03-17 MED ORDER — DEXTROSE 5 % IV SOLN
INTRAVENOUS | Status: DC | PRN
  Administered 2014-03-17: 07:00:00 via INTRAVENOUS

## 2014-03-17 MED ORDER — DEXAMETHASONE SODIUM PHOSPHATE 4 MG/ML IJ SOLN
INTRAMUSCULAR | Status: AC
  Filled 2014-03-17: qty 2

## 2014-03-17 MED ORDER — BISACODYL 10 MG RE SUPP: 10.0000 mg | Freq: Every day | RECTAL | Status: DC | PRN

## 2014-03-17 MED ORDER — OXYCODONE HCL 5 MG PO TABS: 5.0000 mg | ORAL_TABLET | ORAL | Status: DC | PRN

## 2014-03-17 MED ORDER — ARTIFICIAL TEARS OP OINT
TOPICAL_OINTMENT | OPHTHALMIC | Status: AC
  Filled 2014-03-17: qty 3.5

## 2014-03-17 MED ORDER — LORATADINE 10 MG PO TABS
10.0000 mg | ORAL_TABLET | Freq: Every day | ORAL | Status: DC | PRN
  Filled 2014-03-17: qty 1

## 2014-03-17 MED ORDER — ROCURONIUM BROMIDE 100 MG/10ML IV SOLN
INTRAVENOUS | Status: DC | PRN
  Administered 2014-03-17: 40 mg via INTRAVENOUS

## 2014-03-17 MED ORDER — SODIUM CHLORIDE 0.9 % IV SOLN
10.0000 mg | INTRAVENOUS | Status: DC | PRN
  Administered 2014-03-17: 10 ug/min via INTRAVENOUS

## 2014-03-17 MED ORDER — LIDOCAINE HCL (CARDIAC) 20 MG/ML IV SOLN
INTRAVENOUS | Status: DC | PRN
  Administered 2014-03-17: 40 mg via INTRAVENOUS

## 2014-03-17 MED ORDER — LABETALOL HCL 5 MG/ML IV SOLN
10.0000 mg | INTRAVENOUS | Status: DC | PRN
  Administered 2014-03-17: 10 mg via INTRAVENOUS
  Filled 2014-03-17: qty 4

## 2014-03-17 MED ORDER — PROPOFOL 10 MG/ML IV BOLUS
INTRAVENOUS | Status: AC
  Filled 2014-03-17: qty 20

## 2014-03-17 MED ORDER — LISINOPRIL-HYDROCHLOROTHIAZIDE 20-25 MG PO TABS: 1.0000 | ORAL_TABLET | Freq: Every day | ORAL | Status: DC

## 2014-03-17 MED ORDER — VECURONIUM BROMIDE 10 MG IV SOLR
INTRAVENOUS | Status: AC
  Filled 2014-03-17: qty 10

## 2014-03-17 MED ORDER — NEOSTIGMINE METHYLSULFATE 10 MG/10ML IV SOLN
INTRAVENOUS | Status: AC
  Filled 2014-03-17: qty 1

## 2014-03-17 MED ORDER — HEPARIN SODIUM (PORCINE) 1000 UNIT/ML IJ SOLN
INTRAMUSCULAR | Status: AC
  Filled 2014-03-17: qty 1

## 2014-03-17 MED ORDER — ENOXAPARIN SODIUM 30 MG/0.3ML ~~LOC~~ SOLN
30.0000 mg | SUBCUTANEOUS | Status: DC
  Administered 2014-03-18: 30 mg via SUBCUTANEOUS
  Filled 2014-03-17 (×2): qty 0.3

## 2014-03-17 MED ORDER — SODIUM CHLORIDE 0.9 % IV SOLN
INTRAVENOUS | Status: DC
  Administered 2014-03-17 (×2): via INTRAVENOUS

## 2014-03-17 MED ORDER — ACETAMINOPHEN 325 MG PO TABS: 325.0000 mg | ORAL_TABLET | ORAL | Status: DC | PRN

## 2014-03-17 MED ORDER — ROCURONIUM BROMIDE 50 MG/5ML IV SOLN
INTRAVENOUS | Status: AC
  Filled 2014-03-17: qty 1

## 2014-03-17 MED ORDER — PHENOL 1.4 % MT LIQD
1.0000 | OROMUCOSAL | Status: DC | PRN
  Administered 2014-03-17: 1 via OROMUCOSAL
  Filled 2014-03-17: qty 177

## 2014-03-17 MED ORDER — STERILE WATER FOR INJECTION IJ SOLN
INTRAMUSCULAR | Status: AC
  Filled 2014-03-17: qty 10

## 2014-03-17 MED ORDER — LIDOCAINE HCL (PF) 1 % IJ SOLN
INTRAMUSCULAR | Status: AC
  Filled 2014-03-17: qty 30

## 2014-03-17 MED ORDER — LABETALOL HCL 5 MG/ML IV SOLN
INTRAVENOUS | Status: AC
  Filled 2014-03-17: qty 4

## 2014-03-17 MED ORDER — HYDRALAZINE HCL 20 MG/ML IJ SOLN
10.0000 mg | INTRAMUSCULAR | Status: DC | PRN
  Filled 2014-03-17: qty 0.5

## 2014-03-17 MED ORDER — ESMOLOL HCL 10 MG/ML IV SOLN
INTRAVENOUS | Status: AC
  Filled 2014-03-17: qty 10

## 2014-03-17 MED ORDER — MAGNESIUM SULFATE 40 MG/ML IJ SOLN: 2.0000 g | Freq: Every day | INTRAMUSCULAR | Status: DC | PRN

## 2014-03-17 MED ORDER — POTASSIUM CHLORIDE CRYS ER 20 MEQ PO TBCR: 20.0000 meq | EXTENDED_RELEASE_TABLET | Freq: Every day | ORAL | Status: DC | PRN

## 2014-03-17 MED ORDER — DEXAMETHASONE SODIUM PHOSPHATE 4 MG/ML IJ SOLN
INTRAMUSCULAR | Status: DC | PRN
  Administered 2014-03-17: 4 mg via INTRAVENOUS

## 2014-03-17 MED ORDER — LACTATED RINGERS IV SOLN
INTRAVENOUS | Status: DC | PRN
  Administered 2014-03-17: 07:00:00 via INTRAVENOUS

## 2014-03-17 MED ORDER — ONDANSETRON HCL 4 MG/2ML IJ SOLN
INTRAMUSCULAR | Status: DC | PRN
  Administered 2014-03-17: 4 mg via INTRAVENOUS

## 2014-03-17 MED ORDER — DOPAMINE-DEXTROSE 3.2-5 MG/ML-% IV SOLN: 3.0000 ug/kg/min | INTRAVENOUS | Status: DC

## 2014-03-17 MED ORDER — LIDOCAINE HCL (CARDIAC) 20 MG/ML IV SOLN
INTRAVENOUS | Status: AC
  Filled 2014-03-17: qty 20

## 2014-03-17 MED ORDER — FENTANYL CITRATE 0.05 MG/ML IJ SOLN: 25.0000 ug | INTRAMUSCULAR | Status: DC | PRN

## 2014-03-17 MED ORDER — ASPIRIN EC 81 MG PO TBEC
81.0000 mg | DELAYED_RELEASE_TABLET | Freq: Every day | ORAL | Status: DC
  Administered 2014-03-17 – 2014-03-18 (×2): 81 mg via ORAL
  Filled 2014-03-17 (×2): qty 1

## 2014-03-17 MED ORDER — HEPARIN SODIUM (PORCINE) 1000 UNIT/ML IJ SOLN
INTRAMUSCULAR | Status: DC | PRN
  Administered 2014-03-17: 6000 [IU] via INTRAVENOUS

## 2014-03-17 MED ORDER — ONDANSETRON HCL 4 MG/2ML IJ SOLN
INTRAMUSCULAR | Status: AC
  Filled 2014-03-17: qty 2

## 2014-03-17 MED ORDER — ONDANSETRON HCL 4 MG/2ML IJ SOLN: 4.0000 mg | Freq: Four times a day (QID) | INTRAMUSCULAR | Status: DC | PRN

## 2014-03-17 MED ORDER — OXYCODONE HCL 5 MG PO TABS
5.0000 mg | ORAL_TABLET | ORAL | Status: DC | PRN
Start: 2014-03-17 — End: 2014-03-18

## 2014-03-17 MED ORDER — PANTOPRAZOLE SODIUM 40 MG PO TBEC
40.0000 mg | DELAYED_RELEASE_TABLET | Freq: Every day | ORAL | Status: DC
  Administered 2014-03-17 – 2014-03-18 (×2): 40 mg via ORAL
  Filled 2014-03-17 (×2): qty 1

## 2014-03-17 MED ORDER — LABETALOL HCL 5 MG/ML IV SOLN
INTRAVENOUS | Status: DC | PRN
  Administered 2014-03-17 (×2): 10 mg via INTRAVENOUS

## 2014-03-17 MED ORDER — GLYCOPYRROLATE 0.2 MG/ML IJ SOLN
INTRAMUSCULAR | Status: AC
  Filled 2014-03-17: qty 3

## 2014-03-17 MED ORDER — SODIUM CHLORIDE 0.9 % IR SOLN
Status: DC | PRN
  Administered 2014-03-17: 07:00:00

## 2014-03-17 MED ORDER — ACETAMINOPHEN 650 MG RE SUPP: 325.0000 mg | RECTAL | Status: DC | PRN

## 2014-03-17 MED ORDER — EPHEDRINE SULFATE 50 MG/ML IJ SOLN
INTRAMUSCULAR | Status: AC
  Filled 2014-03-17: qty 1

## 2014-03-17 MED ORDER — ARTIFICIAL TEARS OP OINT
TOPICAL_OINTMENT | OPHTHALMIC | Status: DC | PRN
  Administered 2014-03-17: 1 via OPHTHALMIC

## 2014-03-17 MED ORDER — PROTAMINE SULFATE 10 MG/ML IV SOLN
INTRAVENOUS | Status: DC | PRN
  Administered 2014-03-17: 50 mg via INTRAVENOUS

## 2014-03-17 MED ORDER — SODIUM CHLORIDE 0.9 % IV SOLN: 500.0000 mL | Freq: Once | INTRAVENOUS | Status: AC | PRN

## 2014-03-17 MED ORDER — SODIUM CHLORIDE 0.9 % IJ SOLN
INTRAMUSCULAR | Status: AC
  Filled 2014-03-17: qty 10

## 2014-03-17 MED ORDER — PROPOFOL 10 MG/ML IV BOLUS
INTRAVENOUS | Status: DC | PRN
  Administered 2014-03-17: 120 mg via INTRAVENOUS

## 2014-03-17 MED ORDER — ALUM & MAG HYDROXIDE-SIMETH 200-200-20 MG/5ML PO SUSP: 15.0000 mL | ORAL | Status: DC | PRN

## 2014-03-17 MED ORDER — METFORMIN HCL 500 MG PO TABS
1000.0000 mg | ORAL_TABLET | Freq: Two times a day (BID) | ORAL | Status: DC
  Administered 2014-03-17 – 2014-03-18 (×2): 1000 mg via ORAL
  Filled 2014-03-17 (×4): qty 2

## 2014-03-17 MED ORDER — METOPROLOL TARTRATE 1 MG/ML IV SOLN
2.0000 mg | INTRAVENOUS | Status: DC | PRN
  Filled 2014-03-17: qty 5

## 2014-03-17 MED ORDER — NEOSTIGMINE METHYLSULFATE 10 MG/10ML IV SOLN
INTRAVENOUS | Status: DC | PRN
  Administered 2014-03-17: 5 mg via INTRAVENOUS

## 2014-03-17 MED ORDER — GUAIFENESIN-DM 100-10 MG/5ML PO SYRP: 15.0000 mL | ORAL_SOLUTION | ORAL | Status: DC | PRN

## 2014-03-17 MED ORDER — ATENOLOL 100 MG PO TABS
100.0000 mg | ORAL_TABLET | Freq: Every day | ORAL | Status: DC
  Administered 2014-03-17: 100 mg via ORAL
  Filled 2014-03-17 (×2): qty 1

## 2014-03-17 MED ORDER — LISINOPRIL 20 MG PO TABS
20.0000 mg | ORAL_TABLET | Freq: Every day | ORAL | Status: DC
  Administered 2014-03-18: 20 mg via ORAL
  Filled 2014-03-17 (×2): qty 1

## 2014-03-17 MED ORDER — DEXTROSE 5 % IV SOLN
1.5000 g | Freq: Two times a day (BID) | INTRAVENOUS | Status: AC
  Administered 2014-03-17 – 2014-03-18 (×2): 1.5 g via INTRAVENOUS
  Filled 2014-03-17 (×2): qty 1.5

## 2014-03-17 MED ORDER — MIDAZOLAM HCL 5 MG/5ML IJ SOLN
INTRAMUSCULAR | Status: DC | PRN
  Administered 2014-03-17 (×2): 1 mg via INTRAVENOUS

## 2014-03-17 MED ORDER — FENTANYL CITRATE 0.05 MG/ML IJ SOLN
INTRAMUSCULAR | Status: AC
  Filled 2014-03-17: qty 5

## 2014-03-17 MED ORDER — 0.9 % SODIUM CHLORIDE (POUR BTL) OPTIME
TOPICAL | Status: DC | PRN
  Administered 2014-03-17: 2000 mL

## 2014-03-17 MED ORDER — HYDROCHLOROTHIAZIDE 25 MG PO TABS
25.0000 mg | ORAL_TABLET | Freq: Every day | ORAL | Status: DC
  Administered 2014-03-18: 25 mg via ORAL
  Filled 2014-03-17 (×2): qty 1

## 2014-03-17 MED ORDER — PIOGLITAZONE HCL 45 MG PO TABS
45.0000 mg | ORAL_TABLET | Freq: Every day | ORAL | Status: DC
  Filled 2014-03-17 (×2): qty 1

## 2014-03-17 MED ORDER — INSULIN ASPART 100 UNIT/ML ~~LOC~~ SOLN
0.0000 [IU] | Freq: Three times a day (TID) | SUBCUTANEOUS | Status: DC
  Administered 2014-03-18: 3 [IU] via SUBCUTANEOUS

## 2014-03-17 SURGICAL SUPPLY — 50 items
BLADE SURG 10 STRL SS (BLADE) ×3 IMPLANT
CANISTER SUCTION 2500CC (MISCELLANEOUS) ×3 IMPLANT
CATH ROBINSON RED A/P 18FR (CATHETERS) ×3 IMPLANT
CATH SUCT 10FR WHISTLE TIP (CATHETERS) ×3 IMPLANT
CLIP TI MEDIUM 24 (CLIP) ×3 IMPLANT
CLIP TI WIDE RED SMALL 24 (CLIP) ×3 IMPLANT
CRADLE DONUT ADULT HEAD (MISCELLANEOUS) ×3 IMPLANT
DECANTER SPIKE VIAL GLASS SM (MISCELLANEOUS) IMPLANT
DRAIN HEMOVAC 1/8 X 5 (WOUND CARE) IMPLANT
DRSG COVADERM 4X8 (GAUZE/BANDAGES/DRESSINGS) ×2 IMPLANT
ELECT REM PT RETURN 9FT ADLT (ELECTROSURGICAL) ×3
ELECTRODE REM PT RTRN 9FT ADLT (ELECTROSURGICAL) ×1 IMPLANT
EVACUATOR SILICONE 100CC (DRAIN) IMPLANT
GAUZE SPONGE 4X4 12PLY STRL (GAUZE/BANDAGES/DRESSINGS) ×3 IMPLANT
GLOVE BIOGEL PI IND STRL 6.5 (GLOVE) IMPLANT
GLOVE BIOGEL PI IND STRL 7.0 (GLOVE) IMPLANT
GLOVE BIOGEL PI IND STRL 7.5 (GLOVE) IMPLANT
GLOVE BIOGEL PI IND STRL 8 (GLOVE) IMPLANT
GLOVE BIOGEL PI INDICATOR 6.5 (GLOVE) ×4
GLOVE BIOGEL PI INDICATOR 7.0 (GLOVE) ×2
GLOVE BIOGEL PI INDICATOR 7.5 (GLOVE) ×2
GLOVE BIOGEL PI INDICATOR 8 (GLOVE) ×2
GLOVE ECLIPSE 7.0 STRL STRAW (GLOVE) ×2 IMPLANT
GLOVE SS BIOGEL STRL SZ 7 (GLOVE) ×1 IMPLANT
GLOVE SUPERSENSE BIOGEL SZ 7 (GLOVE) ×4
GOWN STRL REUS W/ TWL LRG LVL3 (GOWN DISPOSABLE) ×3 IMPLANT
GOWN STRL REUS W/ TWL XL LVL3 (GOWN DISPOSABLE) IMPLANT
GOWN STRL REUS W/TWL LRG LVL3 (GOWN DISPOSABLE) ×9
GOWN STRL REUS W/TWL XL LVL3 (GOWN DISPOSABLE) ×3
INSERT FOGARTY SM (MISCELLANEOUS) ×3 IMPLANT
KIT BASIN OR (CUSTOM PROCEDURE TRAY) ×3 IMPLANT
KIT ROOM TURNOVER OR (KITS) ×3 IMPLANT
NEEDLE 22X1 1/2 (OR ONLY) (NEEDLE) IMPLANT
NS IRRIG 1000ML POUR BTL (IV SOLUTION) ×6 IMPLANT
PACK CAROTID (CUSTOM PROCEDURE TRAY) ×3 IMPLANT
PAD ARMBOARD 7.5X6 YLW CONV (MISCELLANEOUS) ×6 IMPLANT
PATCH HEMASHIELD 8X75 (Vascular Products) ×2 IMPLANT
PROBE PENCIL 8 MHZ STRL DISP (MISCELLANEOUS) ×2 IMPLANT
SHUNT CAROTID BYPASS 12FRX15.5 (VASCULAR PRODUCTS) IMPLANT
SPONGE INTESTINAL PEANUT (DISPOSABLE) ×3 IMPLANT
SUT PROLENE 6 0 CC (SUTURE) ×3 IMPLANT
SUT PROLENE 7 0 BV 1 (SUTURE) ×2 IMPLANT
SUT SILK 2 0 FS (SUTURE) ×3 IMPLANT
SUT SILK 3 0 TIES 17X18 (SUTURE)
SUT SILK 3-0 18XBRD TIE BLK (SUTURE) IMPLANT
SUT VIC AB 2-0 CT1 27 (SUTURE) ×3
SUT VIC AB 2-0 CT1 TAPERPNT 27 (SUTURE) ×1 IMPLANT
SUT VIC AB 3-0 X1 27 (SUTURE) ×3 IMPLANT
SYR CONTROL 10ML LL (SYRINGE) IMPLANT
WATER STERILE IRR 1000ML POUR (IV SOLUTION) ×3 IMPLANT

## 2014-03-17 NOTE — H&P (Signed)
Vascular Surgery H&P  Chief Complaint: Status post left carotid endarterectomy. Now with severe right ICA stenosis for right carotid endarterectomy  HPI: Debbie [name] is a 80 y.o. female who presents for evaluation of severe right ICA stenosis. Patient had left carotid endarterectomy 01/12/2014 which was uneventful. She has severe right ICA stenosis and now scheduled for right carotid endarterectomy. She denies any neurologic symptoms.   Past Medical History  Diagnosis Date  . Cancer dx'd 12/2007    endometroid adenocarcinoma  . Hypertension   . History of radiation therapy 9/21,9/28,10/05,05/20/2008    4 txs 2400 cGy endometrial adenocarcinoma  . Peripheral vascular disease   . Seasonal allergies   . Arthritis   . Dizziness   . Diabetes mellitus     fasting [id number]   Past Surgical History  Procedure Laterality Date  . Tonsillectomy    . Nasal septum surgery    . Eye surgery Bilateral     cataracts  . Abdominal hysterectomy    . Appendectomy    . Endarterectomy Left 01/12/2014    Procedure: LEFT CAROTID ARTERY ENDARTERECTOMY WITH HEMASHIELD PATCH ANGIOPLASTY;  Surgeon: Mal Misty, MD;  Location: [place];  Service: Vascular;  Laterality: Left;   History   Social History  . Marital Status: Married    Spouse Name: N/A    Number of Children: N/A  . Years of Education: N/A   Social History Main Topics  . Smoking status: Never Smoker   . Smokeless tobacco: Never Used  . Alcohol Use: No  . Drug Use: No  . Sexual Activity: Not on file   Other Topics Concern  . Not on file   Social History Narrative  . No narrative on file   Family History  Problem Relation Age of Onset  . Actinic keratosis Mother    Allergies  Allergen Reactions  . Codeine Other (See Comments)    Unusual mouth sensation   Prior to Admission medications   Medication Sig Start Date End Date Taking? Authorizing Provider  aspirin EC 81 MG tablet Take 81 mg by mouth daily.   Yes  Historical Provider, MD  atenolol (TENORMIN) 100 MG tablet Take 100 mg by mouth daily.   Yes Historical Provider, MD  hydrALAZINE (APRESOLINE) 25 MG tablet Take 25 mg by mouth 2 (two) times daily.   Yes Historical Provider, MD  lisinopril-hydrochlorothiazide (PRINZIDE,ZESTORETIC) 20-25 MG per tablet Take 1 tablet by mouth daily.  10/15/12  Yes Historical Provider, MD  Loratadine (CLARITIN) 10 MG CAPS Take 10 mg by mouth daily as needed (for allergies).    Yes Historical Provider, MD  metFORMIN (GLUCOPHAGE) 1000 MG tablet Take 1,000 mg by mouth 2 (two) times daily with a meal.   Yes Historical Provider, MD  Multiple Vitamin (MULTIVITAMIN) tablet Take 1 tablet by mouth daily. Centrum silver   Yes Historical Provider, MD  pioglitazone (ACTOS) 45 MG tablet Take 45 mg by mouth daily.  01/01/14  Yes Historical Provider, MD  simvastatin (ZOCOR) 80 MG tablet Take 80 mg by mouth at bedtime.   Yes Historical Provider, MD  oxyCODONE (OXY IR/ROXICODONE) 5 MG immediate release tablet Take 1-2 tablets (5-10 mg total) by mouth every 4 (four) hours as needed for moderate pain. 01/13/14   Alvia Grove, PA-C     Positive ROS: Denies chest pain, dyspnea on exertion, PND, orthopnea.  All other systems have been reviewed and were otherwise negative with the exception of those mentioned in the HPI and as above.  Physical Exam: Filed Vitals:   03/17/14 0602  BP: 183/65  Pulse: 77  Temp: 99 F (37.2 C)  Resp: 20    General: Alert, no acute distress HEENT: Normal for age Cardiovascular: Regular rate and rhythm. Carotid pulses 2+, no bruits audible Respiratory: Clear to auscultation. No cyanosis, no use of accessory musculature GI: No organomegaly, abdomen is soft and non-tender Skin: No lesions in the area of chief complaint Neurologic: Sensation intact distally Psychiatric: Patient is competent for consent with normal mood and affect Musculoskeletal: No obvious deformities Extremities: 3+ right brachial  2+ left brachial pulse palpable.       Assessment/Plan:  Patient with greater than 80% right ICA stenosis-asymptomatic Status post left carotid endarterectomy 2 months ago  Plan right carotid endarterectomy today. Risks and benefits thoroughly discussed with patient and her son in the office and they would like to proceed   Tinnie Gens, MD 03/17/2014 7:27 AM

## 2014-03-17 NOTE — Progress Notes (Addendum)
     Patient is comfortable.  Smile symmetric, no tongue deviation, and palpable radial pulse.  Incision dressing C/D/I.   Stable s/p right carotid endarterectomy  Akaisha Truman MAUREEN PA-C

## 2014-03-17 NOTE — Anesthesia Procedure Notes (Signed)
Procedure Name: Intubation Date/Time: 03/17/2014 7:49 AM Performed by: Jacquiline Doe A Pre-anesthesia Checklist: Patient identified, Emergency Drugs available, Timeout performed, Suction available and Patient being monitored Patient Re-evaluated:Patient Re-evaluated prior to inductionOxygen Delivery Method: Circle system utilized Preoxygenation: Pre-oxygenation with 100% oxygen Intubation Type: IV induction and Cricoid Pressure applied Ventilation: Mask ventilation without difficulty and Oral airway inserted - appropriate to patient size Laryngoscope Size: Mac and 4 Grade View: Grade I Tube type: Oral Tube size: 7.5 mm Number of attempts: 1 Airway Equipment and Method: Stylet and LTA kit utilized Placement Confirmation: ETT inserted through vocal cords under direct vision,  breath sounds checked- equal and bilateral and positive ETCO2 Secured at: 21 cm Tube secured with: Tape Dental Injury: Teeth and Oropharynx as per pre-operative assessment

## 2014-03-17 NOTE — Anesthesia Postprocedure Evaluation (Signed)
  Anesthesia Post-op Note  Patient: Debbie [name]  Procedure(s) Performed: Procedure(s): RIGHT CAROTD ARTERY ENDARTERECTOMY WITH DACRON PATCH ANGIOPLASTY (Right)  Patient Location: PACU  Anesthesia Type:General  Level of Consciousness: awake, alert  and oriented  Airway and Oxygen Therapy: Patient Spontanous Breathing and Patient connected to nasal cannula oxygen  Post-op Pain: mild  Post-op Assessment: Post-op Vital signs reviewed, Patient's Cardiovascular Status Stable, Respiratory Function Stable, Patent Airway and Pain level controlled  Post-op Vital Signs: stable  Last Vitals:  Filed Vitals:   03/17/14 1430  BP:   Pulse: 55  Temp:   Resp: 14    Complications: No apparent anesthesia complications

## 2014-03-17 NOTE — Anesthesia Preprocedure Evaluation (Signed)
Anesthesia Evaluation  Patient identified by MRN, date of birth, ID band Patient awake    Reviewed: Allergy & Precautions, H&P , Patient's Chart, lab work & pertinent test results, reviewed documented beta blocker date and time   Airway Mallampati: II TM Distance: >3 FB Neck ROM: Full    Dental  (+) Edentulous Upper, Dental Advisory Given   Pulmonary  breath sounds clear to auscultation        Cardiovascular hypertension, Rhythm:Regular Rate:Normal     Neuro/Psych    GI/Hepatic   Endo/Other  diabetes  Renal/GU      Musculoskeletal   Abdominal (+) + obese,   Peds  Hematology   Anesthesia Other Findings   Reproductive/Obstetrics                           Anesthesia Physical Anesthesia Plan  ASA: III  Anesthesia Plan: General   Post-op Pain Management:    Induction: Intravenous  Airway Management Planned: Oral ETT  Additional Equipment:   Intra-op Plan:   Post-operative Plan:   Informed Consent: I have reviewed the patients History and Physical, chart, labs and discussed the procedure including the risks, benefits and alternatives for the proposed anesthesia with the patient or authorized representative who has indicated his/her understanding and acceptance.   Dental advisory given  Plan Discussed with: CRNA and Anesthesiologist  Anesthesia Plan Comments: (R. Carotid stenosis Hypertension Type 2 DM glucose 174 L. Subclavian stenosis  Plan GA with oral ETT and R. Radial art line )        Anesthesia Quick Evaluation

## 2014-03-17 NOTE — Transfer of Care (Signed)
Immediate Anesthesia Transfer of Care Note  Patient: Debbie [name]  Procedure(s) Performed: Procedure(s): RIGHT CAROTD ARTERY ENDARTERECTOMY WITH DACRON PATCH ANGIOPLASTY (Right)  Patient Location: PACU  Anesthesia Type:General  Level of Consciousness: awake, oriented, sedated, patient cooperative and responds to stimulation  Airway & Oxygen Therapy: Patient Spontanous Breathing and Patient connected to nasal cannula oxygen  Post-op Assessment: Report given to PACU RN, Post -op Vital signs reviewed and stable, Patient moving all extremities and Patient moving all extremities X 4  Post vital signs: Reviewed and stable  Complications: No apparent anesthesia complications

## 2014-03-17 NOTE — Progress Notes (Signed)
Pt,. Unsure about Actos, son will check pill bottles when he goes home & will let floor nurse know if she is still taking medicine or not.

## 2014-03-17 NOTE — Op Note (Signed)
OPERATIVE REPORT  Date of Surgery: 03/17/2014  Surgeon: Tinnie Gens, MD  Assistant: Gerri Lins PA  Pre-op Diagnosis: Severe right ICA stenosis-asymptomatic  Post-op Diagnosis: Same  Procedure: Procedure(s): RIGHT CAROTD ARTERY ENDARTERECTOMY WITH DACRON PATCH ANGIOPLASTY  Anesthesia: General  EBL: 009  Complications: None  Procedure Details: The patient was taken to the operating room and placed in the supine position. Following induction of satisfactory general endotracheal anesthesia the right neck was prepped and draped in a routine sterile manner. Incision was made on the anterior border of the sternocleidomastoid muscle and carried down through the subcutaneous tissue and platysma using the Bovie. Care was taken not to injure the hypoglossal nerve.. The common internal and external carotid arteries were dissected free. There was a calcified atherosclerotic plaque at the carotid bifurcation extending up the internal carotid artery. A #10 shunt was then prepared and the patient was heparinized. The carotid vessels were occluded with vascular clamps. A longitudinal opening was made in the common carotid with a 15 blade extended up the internal carotid with the Potts scissors to a point distal to the disease. The plaque was approximately 80 % stenotic in severity. The distal vessel appeared normal. Shunt was inserted without difficulty reestablishing flow in about 2 minutes. A standard endarterectomy was performed with an eversion endarterectomy of the external carotid. The plaque feathered off  the distal internal carotid artery nicely not requiring any tacking sutures. The lumen was thoroughly irrigated with heparinized saline and loose debris all carefully removed. The arterotomy was then closed with a patch using continuous 6-0 Prolene. Prior to completion of the  Closure the  shunt was removed after approximately 30 minutes of shunt time. Flow was then reestablished up the external  branch initially followed by the internal branch. Protamine was given to her reverse the heparin.Following adequate hemostasis the wound was irrigated with saline and closed in layers with Vicryl ain a subcuticular fashion. Sterile dressing was applied and the patient taken to the recovery room in stable condition.  Tinnie Gens, MD 03/17/2014 9:27 AM

## 2014-03-18 LAB — CBC
HCT: 31.1 % — ABNORMAL LOW (ref 36.0–46.0)
HEMOGLOBIN: 10.1 g/dL — AB (ref 12.0–15.0)
MCH: 28.9 pg (ref 26.0–34.0)
MCHC: 32.5 g/dL (ref 30.0–36.0)
MCV: 89.1 fL (ref 78.0–100.0)
Platelets: 321 10*3/uL (ref 150–400)
RBC: 3.49 MIL/uL — AB (ref 3.87–5.11)
RDW: 14.2 % (ref 11.5–15.5)
WBC: 12.3 10*3/uL — AB (ref 4.0–10.5)

## 2014-03-18 LAB — HEMOGLOBIN A1C
Hgb A1c MFr Bld: 6.4 % — ABNORMAL HIGH (ref <5.7)
Mean Plasma Glucose: 137 mg/dL — ABNORMAL HIGH (ref <117)

## 2014-03-18 LAB — BASIC METABOLIC PANEL
ANION GAP: 15 (ref 5–15)
BUN: 24 mg/dL — ABNORMAL HIGH (ref 6–23)
CALCIUM: 8.9 mg/dL (ref 8.4–10.5)
CHLORIDE: 102 meq/L (ref 96–112)
CO2: 23 meq/L (ref 19–32)
Creatinine, Ser: 1.41 mg/dL — ABNORMAL HIGH (ref 0.50–1.10)
GFR calc Af Amer: 39 mL/min — ABNORMAL LOW (ref >90)
GFR calc non Af Amer: 34 mL/min — ABNORMAL LOW (ref >90)
GLUCOSE: 139 mg/dL — AB (ref 70–99)
Potassium: 5.2 mEq/L (ref 3.7–5.3)
Sodium: 140 mEq/L (ref 137–147)

## 2014-03-18 LAB — GLUCOSE, CAPILLARY: Glucose-Capillary: 156 mg/dL — ABNORMAL HIGH (ref 70–99)

## 2014-03-18 NOTE — Progress Notes (Signed)
Discharge instructions given to patient with teach-back.  All questions answered.  No complaints. Discharged home with son.

## 2014-03-18 NOTE — Progress Notes (Addendum)
     Subjective  - Doing well, not much pain.   Objective 158/55 72 98.1 F (36.7 C) (Oral) 21 100%  Intake/Output Summary (Last 24 hours) at 03/18/14 0709 Last data filed at 03/18/14 0600  Gross per 24 hour  Intake 2047.5 ml  Output    900 ml  Net 1147.5 ml    Right neck incision soft without hematoma Grip 5/5 equal bil No facial droop or tongue deviation Speech clear  Assessment/Planning: POD #1 s/p right carotid endarterectomy Stable Discharge home f/u in 2 weeks   Debbie [name] [facility] 03/18/2014 7:09 AM  Agree with above. For d/c today.  Debbie Mayo, MD, FACS Beeper [phone] 03/18/2014  Laboratory Lab Results:  Recent Labs  03/18/14 0347  WBC 12.3*  HGB 10.1*  HCT 31.1*  PLT 321   BMET  Recent Labs  03/18/14 0347  NA 140  K 5.2  CL 102  CO2 23  GLUCOSE 139*  BUN 24*  CREATININE 1.41*  CALCIUM 8.9    COAG Lab Results  Component Value Date   INR 0.94 03/07/2014   INR 0.96 01/11/2014   No results found for this basename: PTT

## 2014-03-20 ENCOUNTER — Telehealth: Payer: Self-pay | Admitting: Vascular Surgery

## 2014-03-20 ENCOUNTER — Encounter (HOSPITAL_COMMUNITY): Payer: Self-pay | Admitting: Vascular Surgery

## 2014-03-20 NOTE — Telephone Encounter (Addendum)
Message copied by Gena Fray on Mon Mar 20, 2014  1:16 PM ------      Message from: Denman George      Created: Mon Mar 20, 2014 11:18 AM      Regarding: Micheline Rough; also needs 2 wk f/u with JDL                   ----- Message -----         From: Ulyses Amor, PA-C         Sent: 03/18/2014   7:13 AM           To: [facility] Charge Pool            S/P right carotid endarterectomy Dr. Kellie Simmering f/u in 2 weeks ------  03/20/14: left msg for patient re appt, dpm

## 2014-03-20 NOTE — Care Management Note (Signed)
    Page 1 of 1   03/20/2014     11:00:19 AM CARE MANAGEMENT NOTE 03/20/2014  Patient:  Debbie [name], Debbie [name]   Account Number:  [id number]  Date Initiated:  03/20/2014  Documentation initiated by:  Marvetta Gibbons  Subjective/Objective Assessment:   Pt admitted s/p carotid     Action/Plan:   PTA pt lived at home- return home planned   Anticipated DC Date:     Anticipated DC Plan:  HOME/SELF CARE         Choice offered to / List presented to:             Status of service:  Completed, signed off Medicare Important Message given?  NA - LOS <3 / Initial given by admissions (If response is "NO", the following Medicare IM given date fields will be blank) Date Medicare IM given:   Medicare IM given by:   Date Additional Medicare IM given:   Additional Medicare IM given by:    Discharge Disposition:  HOME/SELF CARE  Per UR Regulation:  Reviewed for med. necessity/level of care/duration of stay  If discussed at [place] of Stay Meetings, dates discussed:    Comments:

## 2014-03-20 NOTE — Discharge Summary (Signed)
Patient ID:  Debbie [name] MRN: [id number] DOB/AGE: June 15, 1930 80 y.o.  Admit date: 03/17/2014 Discharge date: 03/20/2014 Date of Surgery: 03/17/2014 Surgeon: Surgeon(s): Mal Misty, MD  Admission Diagnosis: Occlusion and stenosis of carotid artery without mention of cerebral infarction  Discharge Diagnoses:  Occlusion and stenosis of carotid artery without mention of cerebral infarction  Secondary Diagnoses: Past Medical History  Diagnosis Date  . Cancer dx'd 12/2007    endometroid adenocarcinoma  . Hypertension   . History of radiation therapy 9/21,9/28,10/05,05/20/2008    4 txs 2400 cGy endometrial adenocarcinoma  . Peripheral vascular disease   . Seasonal allergies   . Arthritis   . Dizziness   . Diabetes mellitus     fasting [id number]    Procedure(s): RIGHT CAROTD ARTERY ENDARTERECTOMY WITH DACRON PATCH ANGIOPLASTY  Discharged Condition: good  HPI: Debbie [name] is a 80 y.o. female who presents for evaluation of severe right ICA stenosis. Patient had left carotid endarterectomy 01/12/2014 which was uneventful. She has severe right ICA stenosis and now scheduled for right carotid endarterectomy. She denies any neurologic symptoms.    Hospital Course:  Debbie [name] is a 80 y.o. female is S/P  Procedure(s): RIGHT CAROTD ARTERY ENDARTERECTOMY WITH DACRON PATCH ANGIOPLASTY Stable post-op.  Admitted for over night observation.  POD#1 ambulating, taking PO's well and voided independently.  Discharged home with a follow up visit in 2 weeks. Consults:     Significant Diagnostic Studies: CBC Lab Results  Component Value Date   WBC 12.3* 03/18/2014   HGB 10.1* 03/18/2014   HCT 31.1* 03/18/2014   MCV 89.1 03/18/2014   PLT 321 03/18/2014    BMET    Component Value Date/Time   NA 140 03/18/2014 0347   K 5.2 03/18/2014 0347   CL 102 03/18/2014 0347   CO2 23 03/18/2014 0347   GLUCOSE 139* 03/18/2014 0347   BUN 24* 03/18/2014 0347   CREATININE 1.41* 03/18/2014 0347   CALCIUM 8.9 03/18/2014 0347   GFRNONAA 34* 03/18/2014 0347   GFRAA 39* 03/18/2014 0347   COAG Lab Results  Component Value Date   INR 0.94 03/07/2014   INR 0.96 01/11/2014     Disposition:  Discharge to :Home Discharge Instructions   Call MD for:  redness, tenderness, or signs of infection (pain, swelling, bleeding, redness, odor or green/yellow discharge around incision site)    Complete by:  As directed      Call MD for:  severe or increased pain, loss or decreased feeling  in affected limb(s)    Complete by:  As directed      Call MD for:  temperature >100.5    Complete by:  As directed      Discharge instructions    Complete by:  As directed   You may shower as tolerates with soap and water     Driving Restrictions    Complete by:  As directed   No driving for 2 weeks     Increase activity slowly    Complete by:  As directed   Walk with assistance use walker or cane as needed     Lifting restrictions    Complete by:  As directed   No lifting for 6 weeks     Resume previous diet    Complete by:  As directed             Medication List         aspirin EC 81 MG  tablet  Take 81 mg by mouth daily.     atenolol 100 MG tablet  Commonly known as:  TENORMIN  Take 100 mg by mouth daily.     CLARITIN 10 MG Caps  Generic drug:  Loratadine  Take 10 mg by mouth daily as needed (for allergies).     hydrALAZINE 25 MG tablet  Commonly known as:  APRESOLINE  Take 25 mg by mouth 2 (two) times daily.     lisinopril-hydrochlorothiazide 20-25 MG per tablet  Commonly known as:  PRINZIDE,ZESTORETIC  Take 1 tablet by mouth daily.     metFORMIN 1000 MG tablet  Commonly known as:  GLUCOPHAGE  Take 1,000 mg by mouth 2 (two) times daily with a meal.     multivitamin tablet  Take 1 tablet by mouth daily. Centrum silver     oxyCODONE 5 MG immediate release tablet  Commonly known as:  Oxy IR/ROXICODONE  Take 1-2 tablets (5-10 mg total) by  mouth every 4 (four) hours as needed for moderate pain.     pioglitazone 45 MG tablet  Commonly known as:  ACTOS  Take 45 mg by mouth daily.     simvastatin 80 MG tablet  Commonly known as:  ZOCOR  Take 80 mg by mouth at bedtime.       Verbal and written Discharge instructions given to the patient. Wound care per Discharge AVS     Follow-up Information   Follow up with Tinnie Gens, MD In 2 weeks. (sent message to office)    Specialty:  Vascular Surgery   Contact information:   [place] [address] [phone]       Signed: Laurence Slate [facility] 03/20/2014, 12:55 PM  --- For VQI Registry use --- Instructions: Press F2 to tab through selections.  Delete question if not applicable.   Modified Rankin score at D/C (0-6): Rankin Score=0  IV medication needed for:  1. Hypertension: No 2. Hypotension: No  Post-op Complications: No  1. Post-op CVA or TIA: No  If yes: Event classification (right eye, left eye, right cortical, left cortical, verterobasilar, other):   If yes: Timing of event (intra-op, <6 hrs post-op, >=6 hrs post-op, unknown):   2. CN injury: No  If yes: CN  injuried   3. Myocardial infarction: No  If yes: Dx by (EKG or clinical, Troponin):   4.  CHF: No  5.  Dysrhythmia (new): No  6. Wound infection: No  7. Reperfusion symptoms: No  8. Return to OR: No  If yes: return to OR for (bleeding, neurologic, other CEA incision, other):   Discharge medications: Statin use:  Yes ASA use:  Yes Beta blocker use:  Yes ACE-Inhibitor use:  Yes P2Y12 Antagonist use: [x ] None, [ ]  Plavix, [ ]  Plasugrel, [ ]  Ticlopinine, [ ]  Ticagrelor, [ ]  Other, [ ]  No for medical reason, [ ]  Non-compliant, [ ]  Not-indicated Anti-coagulant use:  [x ] None, [ ]  Warfarin, [ ]  Rivaroxaban, [ ]  Dabigatran, [ ]  Other, [ ]  No for medical reason, [ ]  Non-compliant, [ ]  Not-indicated

## 2014-03-20 NOTE — Progress Notes (Signed)
Utilization review completed.  

## 2014-03-21 LAB — TYPE AND SCREEN
ABO/RH(D): O POS
Antibody Screen: NEGATIVE
UNIT DIVISION: 0
Unit division: 0

## 2014-04-04 ENCOUNTER — Encounter: Payer: Medicare HMO | Admitting: Vascular Surgery

## 2014-04-10 ENCOUNTER — Encounter: Payer: Self-pay | Admitting: Vascular Surgery

## 2014-04-11 ENCOUNTER — Ambulatory Visit (INDEPENDENT_AMBULATORY_CARE_PROVIDER_SITE_OTHER): Payer: Self-pay | Admitting: Vascular Surgery

## 2014-04-11 ENCOUNTER — Encounter: Payer: Self-pay | Admitting: Vascular Surgery

## 2014-04-11 VITALS — BP 106/70 | HR 74 | Temp 98.9°F | Resp 16 | Ht 65.0 in | Wt 184.5 lb

## 2014-04-11 DIAGNOSIS — I6523 Occlusion and stenosis of bilateral carotid arteries: Secondary | ICD-10-CM

## 2014-04-11 DIAGNOSIS — Z48812 Encounter for surgical aftercare following surgery on the circulatory system: Secondary | ICD-10-CM | POA: Insufficient documentation

## 2014-04-11 NOTE — Progress Notes (Signed)
Subjective:     Patient ID: Debbie [name], female   DOB: Jan 30, 1931, 80 y.o.   MRN: [id number]  HPIthis 80 year old female returns 2 weeks post right carotid endarterectomy for severe asymptomatic stenosis. Should previously undergone left carotid endarterectomy in the recent past few weeks earlier. She's had no complications or complaints regarding either procedure. She is swallowing well and has had no hoarseness. She does have some numbness anterior to the incisions on both sides. She is taking one aspirin per day.   Review of Systems     Objective:   Physical Exam BP 106/70 mmHg  Pulse 74  Temp(Src) 98.9 F (37.2 C) (Oral)  Resp 16  Ht 5\' 5"  (1.651 m)  Wt 184 lb 8 oz (83.689 kg)  BMI 30.70 kg/m2  SpO2 100%  General well-developed well-nourished female in no apparent distress alert and oriented 3 L lateral neck incisions are healing satisfactorily with no infection or swelling. 3+ carotid pulses palpable with no audible bruits. Neurologic exam normal     Assessment:     Doing well status post staged bilateral carotid endarterectomies for severe asymptomatic bilateral stenoses     Plan:     Return in 6 months for follow-up carotid duplex exam Continue daily aspirin

## 2014-10-10 ENCOUNTER — Other Ambulatory Visit (HOSPITAL_COMMUNITY): Payer: Medicare HMO

## 2014-10-10 ENCOUNTER — Ambulatory Visit: Payer: Medicare HMO | Admitting: Vascular Surgery

## 2014-11-24 ENCOUNTER — Encounter: Payer: Self-pay | Admitting: Vascular Surgery

## 2014-11-28 ENCOUNTER — Encounter: Payer: Self-pay | Admitting: Vascular Surgery

## 2014-11-28 ENCOUNTER — Ambulatory Visit (HOSPITAL_COMMUNITY)
Admission: RE | Admit: 2014-11-28 | Discharge: 2014-11-28 | Disposition: A | Discharge status: Home / Self Care | Payer: Medicare HMO | Source: Ambulatory Visit | Attending: Vascular Surgery | Admitting: Vascular Surgery

## 2014-11-28 ENCOUNTER — Ambulatory Visit (INDEPENDENT_AMBULATORY_CARE_PROVIDER_SITE_OTHER): Payer: Medicare HMO | Admitting: Vascular Surgery

## 2014-11-28 ENCOUNTER — Other Ambulatory Visit: Payer: Self-pay | Admitting: Vascular Surgery

## 2014-11-28 VITALS — BP 139/102 | HR 84 | Resp 16 | Wt 179.0 lb

## 2014-11-28 DIAGNOSIS — Z9889 Other specified postprocedural states: Secondary | ICD-10-CM

## 2014-11-28 DIAGNOSIS — I6523 Occlusion and stenosis of bilateral carotid arteries: Secondary | ICD-10-CM

## 2014-11-28 DIAGNOSIS — Z48812 Encounter for surgical aftercare following surgery on the circulatory system: Secondary | ICD-10-CM

## 2014-11-28 DIAGNOSIS — I6529 Occlusion and stenosis of unspecified carotid artery: Secondary | ICD-10-CM | POA: Insufficient documentation

## 2014-11-28 NOTE — Progress Notes (Signed)
Subjective:     Patient ID: Debbie [name], female   DOB: 04-05-1931, 81 y.o.   MRN: [id number]  HPI this 81 year old female is 6 months post-stage redo bilateral carotid endarterectomy for asymptomatic severe disease. She denies any neurologic symptoms such as lateralizing weakness, aphasia, amaurosis fugax, diplopia, blurred vision, or syncope. She is taking 181 mg aspirin tablet per day. She is swallowing well and denies any hoarseness.  Past Medical History  Diagnosis Date  . Cancer dx'd 12/2007    endometroid adenocarcinoma  . Hypertension   . History of radiation therapy 9/21,9/28,10/05,05/20/2008    4 txs 2400 cGy endometrial adenocarcinoma  . Peripheral vascular disease   . Seasonal allergies   . Arthritis   . Dizziness   . Diabetes mellitus     fasting [id number]    History  Substance Use Topics  . Smoking status: Never Smoker   . Smokeless tobacco: Never Used  . Alcohol Use: No    Family History  Problem Relation Age of Onset  . Actinic keratosis Mother     Allergies  Allergen Reactions  . Codeine Other (See Comments)    Unusual mouth sensation     Current outpatient prescriptions:  .  aspirin EC 81 MG tablet, Take 81 mg by mouth daily., Disp: , Rfl:  .  atenolol (TENORMIN) 100 MG tablet, Take 100 mg by mouth daily., Disp: , Rfl:  .  atorvastatin (LIPITOR) 20 MG tablet, Take 20 mg by mouth daily., Disp: , Rfl:  .  glimepiride (AMARYL) 4 MG tablet, Take 4 mg by mouth daily with breakfast., Disp: , Rfl:  .  hydrALAZINE (APRESOLINE) 25 MG tablet, Take 25 mg by mouth 2 (two) times daily., Disp: , Rfl:  .  Loratadine (CLARITIN) 10 MG CAPS, Take 10 mg by mouth daily as needed (for allergies). , Disp: , Rfl:  .  metFORMIN (GLUCOPHAGE) 1000 MG tablet, Take 1,000 mg by mouth 2 (two) times daily with a meal., Disp: , Rfl:  .  Multiple Vitamin (MULTIVITAMIN) tablet, Take 1 tablet by mouth daily. Centrum silver, Disp: , Rfl:  .  pioglitazone (ACTOS) 45 MG tablet, Take  45 mg by mouth daily. , Disp: , Rfl:  .  lisinopril-hydrochlorothiazide (PRINZIDE,ZESTORETIC) 20-25 MG per tablet, Take 1 tablet by mouth daily. , Disp: , Rfl:  .  oxyCODONE (OXY IR/ROXICODONE) 5 MG immediate release tablet, Take 1-2 tablets (5-10 mg total) by mouth every 4 (four) hours as needed for moderate pain. (Patient not taking: Reported on 11/28/2014), Disp: 20 tablet, Rfl: 0 .  simvastatin (ZOCOR) 80 MG tablet, Take 80 mg by mouth at bedtime., Disp: , Rfl:   Filed Vitals:   11/28/14 1548 11/28/14 1549  BP: 162/109 139/102  Pulse: 88 84  Resp: 16 16  Weight: 179 lb (81.194 kg)     Body mass index is 29.79 kg/(m^2).         Review of Systems has had some problems with seasonal allergies with nasal stuffiness-denies chest pain, dyspnea on exertion, PND, orthopnea, hemoptysis.     Objective:   Physical Exam BP 139/102 mmHg  Pulse 84  Resp 16  Wt 179 lb (81.194 kg)  Gen.-alert and oriented x3 in no apparent distress HEENT normal for age Lungs no rhonchi or wheezing Cardiovascular regular rhythm no murmurs carotid pulses 3+ palpable no bruits audible Abdomen soft nontender no palpable masses Musculoskeletal free of  major deformities Skin clear -no rashes Neurologic normal Lower extremities 3+ femoral and dorsalis pedis  pulses palpable bilaterally with 1+ edema. X line today I ordered carotid duplex exam which I reviewed and interpreted. There is no evidence of significant restenosis in either internal carotid artery.       Assessment:     Doing well 6 months post staged bilateral carotid endarterectomies for severe asymptomatic stenoses    Plan:     Return in 6 months for follow-up carotid duplex exam and see nurse practitioner At that point we will go to annual follow-up visits Continue daily aspirin 81 mg

## 2014-11-28 NOTE — Addendum Note (Signed)
Addended by: Dorthula Rue L on: 11/28/2014 04:24 PM   Modules accepted: Orders

## 2015-01-04 ENCOUNTER — Encounter: Payer: Self-pay | Admitting: Radiation Oncology

## 2015-01-04 ENCOUNTER — Other Ambulatory Visit (HOSPITAL_COMMUNITY)
Admission: RE | Admit: 2015-01-04 | Discharge: 2015-01-04 | Disposition: A | Discharge status: Home / Self Care | Payer: Medicare HMO | Source: Ambulatory Visit | Attending: Radiation Oncology | Admitting: Radiation Oncology

## 2015-01-04 ENCOUNTER — Ambulatory Visit
Admission: RE | Admit: 2015-01-04 | Discharge: 2015-01-04 | Disposition: A | Discharge status: Home / Self Care | Payer: Medicare HMO | Source: Ambulatory Visit | Attending: Radiation Oncology | Admitting: Radiation Oncology

## 2015-01-04 VITALS — BP 131/78 | HR 72 | Temp 98.2°F | Resp 16 | Ht 65.0 in | Wt 189.9 lb

## 2015-01-04 DIAGNOSIS — Z01411 Encounter for gynecological examination (general) (routine) with abnormal findings: Secondary | ICD-10-CM | POA: Diagnosis present

## 2015-01-04 DIAGNOSIS — C549 Malignant neoplasm of corpus uteri, unspecified: Secondary | ICD-10-CM

## 2015-01-04 NOTE — Progress Notes (Signed)
Debbie [name] here for follow up.  She denies pain, bladdier issues, bowel issues, vaginal bleeding/dicharge and fatigue.  She reports having a good appetite.  She had bil endarterectomies in the fall of 2015.  Her bp was elevated at 198/69 in her right arm and 131/78 when retaken in her left arm.  She is taking bp medications.  BP 198/69 mmHg  Pulse 78  Temp(Src) 98.2 F (36.8 C) (Oral)  Resp 16  Ht 5\' 5"  (1.651 m)  Wt 189 lb 14.4 oz (86.138 kg)  BMI 31.60 kg/m2  SpO2 95%

## 2015-01-04 NOTE — Progress Notes (Signed)
Radiation Oncology         [phone]) [phone] ________________________________  Name: Debbie [name] MRN: [id number]  Date: 01/04/2015  DOB: [birth year]  Follow-Up Visit Note  CC: Myriam Jacobson, MD  [name], MD  Diagnosis:   Stage IB grade 3 endometrial adenocarcinoma   Interval Since Last Radiation:  6 years and 9 months, the patient completed vaginal brachii therapy alone after her surgery.  Narrative:  The patient returns today for routine follow-up. She denies pain, bladdier issues, bowel issues, vaginal bleeding/dicharge, and fatigue. She reports having a good appetite. She had bilateral endarterectomies in the fall of 2015. She reports that it went well. Her bp was elevated at 198/69 in her right arm and 131/78 when retaken in her left arm. She is taking bp medications.                           ALLERGIES:  is allergic to codeine.  Meds: Current Outpatient Prescriptions  Medication Sig Dispense Refill  . amLODipine (NORVASC) 2.5 MG tablet   0  . aspirin EC 81 MG tablet Take 81 mg by mouth daily.    [name] atenolol (TENORMIN) 100 MG tablet Take 100 mg by mouth daily.    [name] glimepiride (AMARYL) 4 MG tablet Take 4 mg by mouth daily with breakfast.    . hydrALAZINE (APRESOLINE) 25 MG tablet Take 25 mg by mouth 2 (two) times daily.    . Loratadine (CLARITIN) 10 MG CAPS Take 10 mg by mouth daily as needed (for allergies).     . metFORMIN (GLUCOPHAGE) 1000 MG tablet Take 1,000 mg by mouth 2 (two) times daily with a meal.    . Multiple Vitamin (MULTIVITAMIN) tablet Take 1 tablet by mouth daily. Centrum silver    . pioglitazone (ACTOS) 45 MG tablet Take 45 mg by mouth daily.     . simvastatin (ZOCOR) 80 MG tablet Take 80 mg by mouth at bedtime.    [name] atorvastatin (LIPITOR) 20 MG tablet Take 20 mg by mouth daily.    [name] lisinopril-hydrochlorothiazide (PRINZIDE,ZESTORETIC) 20-25 MG per tablet Take 1 tablet by mouth daily.     [name] oxyCODONE (OXY IR/ROXICODONE) 5 MG immediate release  tablet Take 1-2 tablets (5-10 mg total) by mouth every 4 (four) hours as needed for moderate pain. (Patient not taking: Reported on 11/28/2014) 20 tablet 0   No current facility-administered medications for this encounter.    Physical Findings: The patient is in no acute distress. Patient is alert and oriented.  height is 5\' 5"  (1.651 m) and weight is 189 lb 14.4 oz (86.138 kg). Her oral temperature is 98.2 F (36.8 C). Her blood pressure is 131/78 and her pulse is 72. Her respiration is 16 and oxygen saturation is 95%.  No palpable supraclavicular or axillary adenopathy. The lungs are clear to auscultation. The heart has regular rhythm and rate. The abdomen is soft and nontender with normal bowel sounds. The patient has a large hernia in the left lower abdomen which is easily reducible. No inguinal adenopathy appreciated. On pelvic examination the external genitalia are unremarkable. A speculum exam is performed and no mucosal lesions are noted. A Pap smear was obtained of the proximal vagina. On bimanual and rectovaginal examination there no pelvic masses appreciated.  Lab Findings: Lab Results  Component Value Date   WBC 12.3* 03/18/2014   HGB 10.1* 03/18/2014   HCT 31.1* 03/18/2014   MCV 89.1 03/18/2014   PLT  321 03/18/2014      Radiographic Findings: No results found.  Impression:  No evidence of recurrence on clinical exam today. Pap smear pending.  Plan:  Routine followup in one year, unless she gets pelvic exams and pap smears by her pcp.  This document serves as a record of services personally performed by Gery Pray, MD. It was created on his behalf by Darcus Austin, a trained medical scribe. The creation of this record is based on the scribe's personal observations and the provider's statements to them. This document has been checked and approved by the attending provider.  ____________________________________   [name], PhD, MD

## 2015-01-08 LAB — CYTOLOGY - PAP

## 2015-01-11 ENCOUNTER — Telehealth: Payer: Self-pay | Admitting: Oncology

## 2015-01-11 NOTE — Telephone Encounter (Signed)
Called Debbie [name] and informed her of the good results on her pap smear per Dr. Sondra Come.  [name] verbalized understanding.

## 2015-06-07 ENCOUNTER — Ambulatory Visit: Payer: Medicare HMO | Admitting: Family

## 2015-06-07 ENCOUNTER — Encounter (HOSPITAL_COMMUNITY): Payer: Medicare HMO

## 2015-06-12 ENCOUNTER — Ambulatory Visit: Payer: Medicare HMO | Admitting: Family

## 2015-06-12 ENCOUNTER — Encounter (HOSPITAL_COMMUNITY): Payer: Medicare HMO

## 2015-07-03 ENCOUNTER — Encounter: Payer: Self-pay | Admitting: Family

## 2015-07-13 ENCOUNTER — Encounter (HOSPITAL_COMMUNITY): Payer: Medicare HMO

## 2015-07-13 ENCOUNTER — Ambulatory Visit: Payer: Medicare HMO | Admitting: Family

## 2015-08-07 ENCOUNTER — Encounter: Payer: Self-pay | Admitting: Family

## 2015-08-15 ENCOUNTER — Encounter: Payer: Self-pay | Admitting: Family

## 2015-08-15 ENCOUNTER — Ambulatory Visit (HOSPITAL_COMMUNITY)
Admission: RE | Admit: 2015-08-15 | Discharge: 2015-08-15 | Disposition: A | Discharge status: Home / Self Care | Payer: Medicare HMO | Source: Ambulatory Visit | Attending: Family | Admitting: Family

## 2015-08-15 ENCOUNTER — Ambulatory Visit (INDEPENDENT_AMBULATORY_CARE_PROVIDER_SITE_OTHER): Payer: Medicare HMO | Admitting: Family

## 2015-08-15 VITALS — BP 140/89 | HR 87 | Ht 65.0 in | Wt 188.5 lb

## 2015-08-15 DIAGNOSIS — Z9889 Other specified postprocedural states: Secondary | ICD-10-CM | POA: Diagnosis not present

## 2015-08-15 DIAGNOSIS — E119 Type 2 diabetes mellitus without complications: Secondary | ICD-10-CM | POA: Insufficient documentation

## 2015-08-15 DIAGNOSIS — I6523 Occlusion and stenosis of bilateral carotid arteries: Secondary | ICD-10-CM

## 2015-08-15 DIAGNOSIS — Z48812 Encounter for surgical aftercare following surgery on the circulatory system: Secondary | ICD-10-CM | POA: Diagnosis not present

## 2015-08-15 DIAGNOSIS — G458 Other transient cerebral ischemic attacks and related syndromes: Secondary | ICD-10-CM | POA: Insufficient documentation

## 2015-08-15 DIAGNOSIS — I10 Essential (primary) hypertension: Secondary | ICD-10-CM | POA: Insufficient documentation

## 2015-08-15 NOTE — Progress Notes (Signed)
Chief Complaint: Extracranial Carotid Artery Stenosis   History of Present Illness  Debbie [name] is a 82 y.o. female patient of Dr. Kellie Simmering who is s/p staged bilateral carotid endarterectomy: left on 01/12/14, right on 03/17/14, for asymptomatic severe disease.   The patient denies any history of TIA or stroke symptoms, specifically the patient denies a history of amaurosis fugax or monocular blindness, denies a history unilateral  of facial drooping, denies a history of hemiplegia, and denies a history of receptive or expressive aphasia.    Pt denies tingling, numbness, cold sensation, or pain in either hand/arm.   The patient denies New Medical or Surgical History.  Pt states her blood pressure was normal when she saw her PCP recently.  Pt states her blood pressure at home is [id number] systolic in her right arm.  Pt Diabetic: yes, states in good control Pt smoker: former smoker, quit in the 1990's  Pt meds include: Statin : yes ASA: yes Other anticoagulants/antiplatelets: no   Past Medical History  Diagnosis Date  . Cancer ([place]) dx'd 12/2007    endometroid adenocarcinoma  . Hypertension   . History of radiation therapy 9/21,9/28,10/05,05/20/2008    4 txs 2400 cGy endometrial adenocarcinoma  . Peripheral vascular disease ([place])   . Seasonal allergies   . Arthritis   . Dizziness   . Diabetes mellitus     fasting [id number]    Social History Social History  Substance Use Topics  . Smoking status: Never Smoker   . Smokeless tobacco: Never Used  . Alcohol Use: No    Family History Family History  Problem Relation Age of Onset  . Actinic keratosis Mother     Surgical History Past Surgical History  Procedure Laterality Date  . Tonsillectomy    . Nasal septum surgery    . Eye surgery Bilateral     cataracts  . Abdominal hysterectomy    . Appendectomy    . Endarterectomy Left 01/12/2014    Procedure: LEFT CAROTID ARTERY ENDARTERECTOMY WITH HEMASHIELD PATCH  ANGIOPLASTY;  Surgeon: Mal Misty, MD;  Location: [place];  Service: Vascular;  Laterality: Left;  . Endarterectomy Right 03/17/2014    Procedure: RIGHT CAROTD ARTERY ENDARTERECTOMY WITH DACRON PATCH ANGIOPLASTY;  Surgeon: Mal Misty, MD;  Location: [place];  Service: Vascular;  Laterality: Right;    Allergies  Allergen Reactions  . Codeine Other (See Comments)    Unusual mouth sensation    Current Outpatient Prescriptions  Medication Sig Dispense Refill  . amLODipine (NORVASC) 2.5 MG tablet   0  . aspirin EC 81 MG tablet Take 81 mg by mouth daily.    [name] atenolol (TENORMIN) 100 MG tablet Take 100 mg by mouth daily.    [name] atorvastatin (LIPITOR) 20 MG tablet Take 20 mg by mouth daily.    [name] glimepiride (AMARYL) 4 MG tablet Take 4 mg by mouth daily with breakfast.    . hydrALAZINE (APRESOLINE) 25 MG tablet Take 25 mg by mouth 2 (two) times daily.    [name] lisinopril-hydrochlorothiazide (PRINZIDE,ZESTORETIC) 20-25 MG per tablet Take 1 tablet by mouth daily.     . Loratadine (CLARITIN) 10 MG CAPS Take 10 mg by mouth daily as needed (for allergies).     . metFORMIN (GLUCOPHAGE) 1000 MG tablet Take 1,000 mg by mouth 2 (two) times daily with a meal.    . Multiple Vitamin (MULTIVITAMIN) tablet Take 1 tablet by mouth daily. Centrum silver    . pioglitazone (ACTOS) 45 MG tablet  Take 45 mg by mouth daily.     . simvastatin (ZOCOR) 80 MG tablet Take 80 mg by mouth at bedtime.    [name] oxyCODONE (OXY IR/ROXICODONE) 5 MG immediate release tablet Take 1-2 tablets (5-10 mg total) by mouth every 4 (four) hours as needed for moderate pain. (Patient not taking: Reported on 11/28/2014) 20 tablet 0   No current facility-administered medications for this visit.    Review of Systems : See HPI for pertinent positives and negatives.  Physical Examination  Filed Vitals:   08/15/15 1551 08/15/15 1554  BP: 194/87 140/89  Pulse: 87   Height: 5\' 5"  (1.651 m)   Weight: 188 lb 8 oz (85.503 kg)   SpO2: 96%    Body  mass index is 31.37 kg/(m^2).  General: WDWN female in NAD GAIT: normal Eyes: PERRLA Pulmonary:  Non-labored, CTAB  Cardiac: regular rhythm, no detected murmur.  VASCULAR EXAM Carotid Bruits Right Left   Positive Negative    Aorta is not palpable. Radial pulses are 1+ right and 2+ left  palpable                                                                                                                          LE Pulses Right Left       POPLITEAL  not palpable   not palpable       POSTERIOR TIBIAL  not palpable   not palpable        DORSALIS PEDIS      ANTERIOR TIBIAL  palpable   palpable     Gastrointestinal: soft, nontender, BS WNL, no r/g,  no palpable masses.  Musculoskeletal: no muscle atrophy/wasting. M/S 5/5 throughout, extremities without ischemic changes.  Neurologic: A&O X 3; Appropriate Affect, Speech is normal CN 2-12 intact, pain and light touch intact in extremities, Motor exam as listed above.   Non-Invasive Vascular Imaging CAROTID DUPLEX 08/15/2015   No significant stenosis of the bilateral ECA or CCA. Retrograde left vertebral artery flow with a monophasic left proximal subclavian artery waveform and a significant difference in the bilateral brachial pressures. Patent bilateral carotid endarterectomy sites with no internal carotid artery stenosis. 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  No significant change compared to exam of 11/28/14.    Assessment: Debbie [name] is a 82 y.o. female who is s/p staged bilateral carotid endarterectomy: left on 01/12/14, right on 03/17/14, for asymptomatic severe disease.  She has no hx of stroke or TIA. Today's carotid duplex suggests retrograde left vertebral artery flow with a monophasic left proximal subclavian artery waveform and a significant difference in the bilateral brachial pressures. Patent bilateral carotid endarterectomy sites with no internal carotid artery  stenosis. 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  No significant change compared to exam of 11/28/14.    I advised pt to notify her PCP if her home blood pressure in her right arm is greater than 130/80.  Plan:  Follow-up in 1 year with Carotid Duplex scan.   I discussed in depth with the patient the nature of atherosclerosis, and emphasized the importance of maximal medical management including strict control of blood pressure, blood glucose, and lipid levels, obtaining regular exercise, and continued cessation of smoking.  The patient is aware that without maximal medical management the underlying atherosclerotic disease process will progress, limiting the benefit of any interventions. The patient was given information about stroke prevention and what symptoms should prompt the patient to seek immediate medical care. Thank you for allowing [place] to participate in this patient's care.  Clemon Chambers, RN, MSN, FNP-C [facility] Office: [phone]  Clinic Physician: Scot Dock  08/15/2015 4:23 PM

## 2015-08-15 NOTE — Patient Instructions (Signed)
Stroke Prevention Some medical conditions and behaviors are associated with an increased chance of having a stroke. You may prevent a stroke by making healthy choices and managing medical conditions. HOW CAN I REDUCE MY RISK OF HAVING A STROKE?   Stay physically active. Get at least 30 minutes of activity on most or all days.  Do not smoke. It may also be helpful to avoid exposure to secondhand smoke.  Limit alcohol use. Moderate alcohol use is considered to be:  No more than 2 drinks per day for men.  No more than 1 drink per day for nonpregnant women.  Eat healthy foods. This involves:  Eating 5 or more servings of fruits and vegetables a day.  Making dietary changes that address high blood pressure (hypertension), high cholesterol, diabetes, or obesity.  Manage your cholesterol levels.  Making food choices that are high in fiber and low in saturated fat, trans fat, and cholesterol may control cholesterol levels.  Take any prescribed medicines to control cholesterol as directed by your health care provider.  Manage your diabetes.  Controlling your carbohydrate and sugar intake is recommended to manage diabetes.  Take any prescribed medicines to control diabetes as directed by your health care provider.  Control your hypertension.  Making food choices that are low in salt (sodium), saturated fat, trans fat, and cholesterol is recommended to manage hypertension.  Ask your health care provider if you need treatment to lower your blood pressure. Take any prescribed medicines to control hypertension as directed by your health care provider.  If you are 18-39 years of age, have your blood pressure checked every 3-5 years. If you are 40 years of age or older, have your blood pressure checked every year.  Maintain a healthy weight.  Reducing calorie intake and making food choices that are low in sodium, saturated fat, trans fat, and cholesterol are recommended to manage  weight.  Stop drug abuse.  Avoid taking birth control pills.  Talk to your health care provider about the risks of taking birth control pills if you are over 35 years old, smoke, get migraines, or have ever had a blood clot.  Get evaluated for sleep disorders (sleep apnea).  Talk to your health care provider about getting a sleep evaluation if you snore a lot or have excessive sleepiness.  Take medicines only as directed by your health care provider.  For some people, aspirin or blood thinners (anticoagulants) are helpful in reducing the risk of forming abnormal blood clots that can lead to stroke. If you have the irregular heart rhythm of atrial fibrillation, you should be on a blood thinner unless there is a good reason you cannot take them.  Understand all your medicine instructions.  Make sure that other conditions (such as anemia or atherosclerosis) are addressed. SEEK IMMEDIATE MEDICAL CARE IF:   You have sudden weakness or numbness of the face, arm, or leg, especially on one side of the body.  Your face or eyelid droops to one side.  You have sudden confusion.  You have trouble speaking (aphasia) or understanding.  You have sudden trouble seeing in one or both eyes.  You have sudden trouble walking.  You have dizziness.  You have a loss of balance or coordination.  You have a sudden, severe headache with no known cause.  You have new chest pain or an irregular heartbeat. Any of these symptoms may represent a serious problem that is an emergency. Do not wait to see if the symptoms will   go away. Get medical help at once. Call your local emergency services (911 in [place] Do not drive yourself to the hospital.   This information is not intended to replace advice given to you by your health care provider. Make sure you discuss any questions you have with your health care provider.   Document Released: 07/03/2004 Document Revised: 06/16/2014 Document Reviewed:  11/26/2012 Elsevier Interactive Patient Education 2016 Elsevier Inc.  

## 2015-08-17 ENCOUNTER — Other Ambulatory Visit: Payer: Self-pay | Admitting: *Deleted

## 2015-08-17 DIAGNOSIS — I6523 Occlusion and stenosis of bilateral carotid arteries: Secondary | ICD-10-CM

## 2015-10-06 ENCOUNTER — Ambulatory Visit (INDEPENDENT_AMBULATORY_CARE_PROVIDER_SITE_OTHER): Payer: Medicare HMO | Admitting: Family Medicine

## 2015-10-06 VITALS — BP 136/62 | HR 83 | Temp 98.4°F | Resp 16 | Ht 63.0 in | Wt 181.2 lb

## 2015-10-06 DIAGNOSIS — K0889 Other specified disorders of teeth and supporting structures: Secondary | ICD-10-CM

## 2015-10-06 DIAGNOSIS — J209 Acute bronchitis, unspecified: Secondary | ICD-10-CM | POA: Diagnosis not present

## 2015-10-06 MED ORDER — AMOXICILLIN 875 MG PO TABS: 875.0000 mg | ORAL_TABLET | Freq: Two times a day (BID) | ORAL | Status: DC

## 2015-10-06 MED ORDER — PREDNISONE 10 MG PO TABS: 20.0000 mg | ORAL_TABLET | Freq: Every day | ORAL | Status: DC

## 2015-10-06 MED ORDER — ALBUTEROL SULFATE 108 (90 BASE) MCG/ACT IN AEPB: 1.0000 | INHALATION_SPRAY | RESPIRATORY_TRACT | Status: DC | PRN

## 2015-10-06 MED ORDER — BENZONATATE 200 MG PO CAPS: 200.0000 mg | ORAL_CAPSULE | Freq: Three times a day (TID) | ORAL | Status: DC | PRN

## 2015-10-06 NOTE — Progress Notes (Signed)
Debbie [name] is a 82 y.o. female who presents to Urgent Care today for cough congestion runny nose. Symptoms present for a few days now. She does note some wheezing and shortness of breath. Additionally she notes left lower tooth pain and swelling for several weeks. This is consistent with dental infection. She's tried over-the-counter cough and congestion medicines which have helped only a little. She feels well otherwise. No chest pains palpitations.   Past Medical History  Diagnosis Date  . Cancer ([place]) dx'd 12/2007    endometroid adenocarcinoma  . Hypertension   . History of radiation therapy 9/21,9/28,10/05,05/20/2008    4 txs 2400 cGy endometrial adenocarcinoma  . Peripheral vascular disease ([place])   . Seasonal allergies   . Arthritis   . Dizziness   . Diabetes mellitus     fasting [id number]  . Allergy   . Anxiety   . Cataract     both   Past Surgical History  Procedure Laterality Date  . Tonsillectomy    . Nasal septum surgery    . Eye surgery Bilateral     cataracts  . Abdominal hysterectomy    . Appendectomy    . Endarterectomy Left 01/12/2014    Procedure: LEFT CAROTID ARTERY ENDARTERECTOMY WITH HEMASHIELD PATCH ANGIOPLASTY;  Surgeon: Mal Misty, MD;  Location: [place];  Service: Vascular;  Laterality: Left;  . Endarterectomy Right 03/17/2014    Procedure: RIGHT CAROTD ARTERY ENDARTERECTOMY WITH DACRON PATCH ANGIOPLASTY;  Surgeon: Mal Misty, MD;  Location: [place];  Service: Vascular;  Laterality: Right;  . Neck surgery Bilateral     arterial   Social History  Substance Use Topics  . Smoking status: Never Smoker   . Smokeless tobacco: Never Used  . Alcohol Use: No   ROS as above Medications: Current Outpatient Prescriptions  Medication Sig Dispense Refill  . amLODipine (NORVASC) 2.5 MG tablet   0  . aspirin EC 81 MG tablet Take 81 mg by mouth daily.    [name] atorvastatin (LIPITOR) 20 MG tablet Take 20 mg by mouth daily.    [name] glimepiride (AMARYL) 4 MG  tablet Take 4 mg by mouth daily with breakfast.    . hydrALAZINE (APRESOLINE) 25 MG tablet Take 25 mg by mouth 2 (two) times daily.    [name] lisinopril-hydrochlorothiazide (PRINZIDE,ZESTORETIC) 20-25 MG per tablet Take 1 tablet by mouth daily.     . metFORMIN (GLUCOPHAGE) 1000 MG tablet Take 1,000 mg by mouth 2 (two) times daily with a meal.    . Multiple Vitamin (MULTIVITAMIN) tablet Take 1 tablet by mouth daily. Centrum silver    . pioglitazone (ACTOS) 45 MG tablet Take 45 mg by mouth daily.     . simvastatin (ZOCOR) 80 MG tablet Take 80 mg by mouth at bedtime.    . Albuterol Sulfate (PROAIR RESPICLICK) 123XX123 (90 Base) MCG/ACT AEPB Inhale 1-2 puffs into the lungs every 4 (four) hours as needed. 1 each 1  . amoxicillin (AMOXIL) 875 MG tablet Take 1 tablet (875 mg total) by mouth 2 (two) times daily. 28 tablet 0  . atenolol (TENORMIN) 100 MG tablet Take 100 mg by mouth daily. Reported on 10/06/2015    . benzonatate (TESSALON) 200 MG capsule Take 1 capsule (200 mg total) by mouth 3 (three) times daily as needed for cough. 45 capsule 0  . Loratadine (CLARITIN) 10 MG CAPS Take 10 mg by mouth daily as needed (for allergies). Reported on 10/06/2015    . oxyCODONE (OXY IR/ROXICODONE) 5 MG  immediate release tablet Take 1-2 tablets (5-10 mg total) by mouth every 4 (four) hours as needed for moderate pain. (Patient not taking: Reported on 11/28/2014) 20 tablet 0  . predniSONE (DELTASONE) 10 MG tablet Take 2 tablets (20 mg total) by mouth daily with breakfast. 10 tablet 0   No current facility-administered medications for this visit.   Allergies  Allergen Reactions  . Codeine Other (See Comments)    Unusual mouth sensation     Exam:  BP 136/62 mmHg  Pulse 83  Temp(Src) 98.4 F (36.9 C) (Oral)  Resp 16  Ht 5\' 3"  (1.6 m)  Wt 181 lb 3.2 oz (82.192 kg)  BMI 32.11 kg/m2  SpO2 94% Gen: Well NAD Nontoxic appearing HEENT: EOMI,  MMM clear nasal discharge. Left lower tooth broken with gumline erythema and  tender. Lungs: Normal work of breathing.  History of her phase and wheezing present bilaterally Heart: RRR no MRG Abd: NABS, Soft. Nondistended, Nontender Exts: Brisk capillary refill, warm and well perfused. No swelling  No results found for this or any previous visit (from the past 24 hour(s)). No results found.  Assessment and Plan: 82 y.o. female with bronchitis and dental infection. Treatment with low dose prednisone albuterol amoxicillin and Tessalon Perles. Follow-up with PCP in dentist. Return as needed.  Discussed warning signs or symptoms. Please see discharge instructions. Patient expresses understanding.

## 2015-10-06 NOTE — Patient Instructions (Signed)
Thank you for coming in today. Take prednisone daily  Use the cough pills as needed  Use the inhailler as needed.  Take the amoxicillin antibiotics for tooth pain,  Follow up with your regular doctor and your dentist.  Call or go to the emergency room if you get worse, have trouble breathing, have chest pains, or palpitations.   Acute Bronchitis Bronchitis is inflammation of the airways that extend from the windpipe into the lungs (bronchi). The inflammation often causes mucus to develop. This leads to a cough, which is the most common symptom of bronchitis.  In acute bronchitis, the condition usually develops suddenly and goes away over time, usually in a couple weeks. Smoking, allergies, and asthma can make bronchitis worse. Repeated episodes of bronchitis may cause further lung problems.  CAUSES Acute bronchitis is most often caused by the same virus that causes a cold. The virus can spread from person to person (contagious) through coughing, sneezing, and touching contaminated objects. SIGNS AND SYMPTOMS   Cough.   Fever.   Coughing up mucus.   Body aches.   Chest congestion.   Chills.   Shortness of breath.   Sore throat.  DIAGNOSIS  Acute bronchitis is usually diagnosed through a physical exam. Your health care provider will also ask you questions about your medical history. Tests, such as chest X-rays, are sometimes done to rule out other conditions.  TREATMENT  Acute bronchitis usually goes away in a couple weeks. Oftentimes, no medical treatment is necessary. Medicines are sometimes given for relief of fever or cough. Antibiotic medicines are usually not needed but may be prescribed in certain situations. In some cases, an inhaler may be recommended to help reduce shortness of breath and control the cough. A cool mist vaporizer may also be used to help thin bronchial secretions and make it easier to clear the chest.  HOME CARE INSTRUCTIONS  Get plenty of rest.    Drink enough fluids to keep your urine clear or pale yellow (unless you have a medical condition that requires fluid restriction). Increasing fluids may help thin your respiratory secretions (sputum) and reduce chest congestion, and it will prevent dehydration.   Take medicines only as directed by your health care provider.  If you were prescribed an antibiotic medicine, finish it all even if you start to feel better.  Avoid smoking and secondhand smoke. Exposure to cigarette smoke or irritating chemicals will make bronchitis worse. If you are a smoker, consider using nicotine gum or skin patches to help control withdrawal symptoms. Quitting smoking will help your lungs heal faster.   Reduce the chances of another bout of acute bronchitis by washing your hands frequently, avoiding people with cold symptoms, and trying not to touch your hands to your mouth, nose, or eyes.   Keep all follow-up visits as directed by your health care provider.  SEEK MEDICAL CARE IF: Your symptoms do not improve after 1 week of treatment.  SEEK IMMEDIATE MEDICAL CARE IF:  You develop an increased fever or chills.   You have chest pain.   You have severe shortness of breath.  You have bloody sputum.   You develop dehydration.  You faint or repeatedly feel like you are going to pass out.  You develop repeated vomiting.  You develop a severe headache. MAKE SURE YOU:   Understand these instructions.  Will watch your condition.  Will get help right away if you are not doing well or get worse.   This information is not  intended to replace advice given to you by your health care provider. Make sure you discuss any questions you have with your health care provider.   Document Released: 07/03/2004 Document Revised: 06/16/2014 Document Reviewed: 11/16/2012 Elsevier Interactive Patient Education [employer].

## 2015-11-07 ENCOUNTER — Encounter: Payer: Self-pay | Admitting: Cardiology

## 2015-11-21 ENCOUNTER — Encounter: Payer: Self-pay | Admitting: Cardiology

## 2015-11-22 ENCOUNTER — Encounter: Payer: Self-pay | Admitting: Cardiology

## 2016-01-03 ENCOUNTER — Telehealth: Payer: Self-pay | Admitting: Oncology

## 2016-01-03 ENCOUNTER — Other Ambulatory Visit (HOSPITAL_COMMUNITY)
Admission: RE | Admit: 2016-01-03 | Discharge: 2016-01-03 | Disposition: A | Discharge status: Home / Self Care | Payer: Medicare HMO | Source: Ambulatory Visit | Attending: Radiation Oncology | Admitting: Radiation Oncology

## 2016-01-03 ENCOUNTER — Telehealth: Payer: Self-pay | Admitting: Gynecologic Oncology

## 2016-01-03 ENCOUNTER — Encounter: Payer: Self-pay | Admitting: Radiation Oncology

## 2016-01-03 ENCOUNTER — Ambulatory Visit
Admission: RE | Admit: 2016-01-03 | Discharge: 2016-01-03 | Disposition: A | Discharge status: Home / Self Care | Payer: Medicare HMO | Source: Ambulatory Visit | Attending: Radiation Oncology | Admitting: Radiation Oncology

## 2016-01-03 DIAGNOSIS — Z923 Personal history of irradiation: Secondary | ICD-10-CM | POA: Insufficient documentation

## 2016-01-03 DIAGNOSIS — C541 Malignant neoplasm of endometrium: Secondary | ICD-10-CM | POA: Diagnosis present

## 2016-01-03 DIAGNOSIS — Z01411 Encounter for gynecological examination (general) (routine) with abnormal findings: Secondary | ICD-10-CM | POA: Diagnosis present

## 2016-01-03 DIAGNOSIS — C549 Malignant neoplasm of corpus uteri, unspecified: Secondary | ICD-10-CM

## 2016-01-03 NOTE — Telephone Encounter (Signed)
Left a message for Dr. [name]' nurse advising them that Anadia's bp was elevated today at 224/67.

## 2016-01-03 NOTE — Progress Notes (Addendum)
Debbie [name] here for follow up.  She denies having pain.  She reports that she thinks she has a UTI and gave a urine specimen yesterday at her yearly appointment with her PCP.  She denies having any bowel issues.  She denies having any vaginal/rectal bleeding or discharge.  She reports having a good energy level.  She is not using a vaginal dilator.  BP (!) 224/67 (BP Location: Right Arm, Patient Position: Sitting)   Pulse 85   Temp 98.2 F (36.8 C) (Oral)   Ht 5\' 3"  (1.6 m)   Wt 183 lb 14.4 oz (83.4 kg)   SpO2 95%   BMI 32.58 kg/m    Wt Readings from Last 3 Encounters:  01/03/16 183 lb 14.4 oz (83.4 kg)  10/06/15 181 lb 3.2 oz (82.2 kg)  08/15/15 188 lb 8 oz (85.5 kg)

## 2016-01-03 NOTE — Telephone Encounter (Signed)
Left message for the patient asking her to please call the office so we could arrange for her to meet with Dr. Fermin Schwab tomorrow in the office per Dr. Clabe Seal request.

## 2016-01-03 NOTE — Progress Notes (Signed)
Radiation Oncology         [phone]) [phone] ________________________________  Name: BREHANA SCHMUCK MRN: [id number]  Date: 01/03/2016  DOB: 11-Jul-1930  Follow-Up Visit Note  CC: Debbie Jacobson, MD  Clarke-Pearson, Quillian Quince  Diagnosis:   Stage IB grade 3 endometrial adenocarcinoma   Interval Since Last Radiation:  7 years and 9 months, the patient completed vaginal brachytherapy alone after her surgery.  Narrative:  The patient returns today for routine follow-up. She does not have a gynecologist and returns yearly for pelvic exams and Pap smears. She denies having pain.  She reports that she thinks she has a UTI and gave a urine specimen yesterday at her yearly appointment with her PCP.  She denies having any bowel issues.  She denies having any vaginal/rectal bleeding or discharge.  She reports having a good energy level.  She is not using a vaginal dilator. Patient denies hematuria and dysuria. The patient reports that she has not had a pap smear since last year when I performed one.  She reports occasional bleeding from a "pimple" when sitting for long periods of time.                            ALLERGIES:  is allergic to codeine.  Meds: Current Outpatient Prescriptions  Medication Sig Dispense Refill  . amLODipine (NORVASC) 2.5 MG tablet   0  . aspirin EC 81 MG tablet Take 81 mg by mouth daily.    [name] atenolol (TENORMIN) 100 MG tablet Take 100 mg by mouth daily. Reported on 10/06/2015    . glimepiride (AMARYL) 4 MG tablet Take 4 mg by mouth daily with breakfast.    . hydrALAZINE (APRESOLINE) 25 MG tablet Take 25 mg by mouth 2 (two) times daily.    [name] lisinopril-hydrochlorothiazide (PRINZIDE,ZESTORETIC) 20-25 MG per tablet Take 1 tablet by mouth daily.     . Loratadine (CLARITIN) 10 MG CAPS Take 10 mg by mouth daily as needed (for allergies). Reported on 10/06/2015    . metFORMIN (GLUCOPHAGE) 1000 MG tablet Take 1,000 mg by mouth 2 (two) times daily with a meal.    . Multiple  Vitamin (MULTIVITAMIN) tablet Take 1 tablet by mouth daily. Centrum silver    . simvastatin (ZOCOR) 80 MG tablet Take 80 mg by mouth at bedtime.    . Albuterol Sulfate (PROAIR RESPICLICK) 123XX123 (90 Base) MCG/ACT AEPB Inhale 1-2 puffs into the lungs every 4 (four) hours as needed. (Patient not taking: Reported on 01/03/2016) 1 each 1  . oxyCODONE (OXY IR/ROXICODONE) 5 MG immediate release tablet Take 1-2 tablets (5-10 mg total) by mouth every 4 (four) hours as needed for moderate pain. (Patient not taking: Reported on 11/28/2014) 20 tablet 0  . pioglitazone (ACTOS) 45 MG tablet Take 45 mg by mouth daily.      No current facility-administered medications for this encounter.     Physical Findings: The patient is in no acute distress. Patient is alert and oriented.  height is 5\' 3"  (1.6 m) and weight is 183 lb 14.4 oz (83.4 kg). Her oral temperature is 98.2 F (36.8 C). Her blood pressure is 224/67 (abnormal) and her pulse is 85. Her oxygen saturation is 95%.  No palpable supraclavicular or axillary adenopathy. The lungs are clear to auscultation. The heart has regular rhythm and rate. The abdomen is soft and nontender with normal bowel sounds.. No inguinal adenopathy appreciated. Vaginal exam revealed polypoid lesion along the left mid-distal  vagina.  Bleeds easily with palpation.  Unable to get a good view of vaginal cuff area in light of her bleeding.  Pap smears obtained of this polypoid lesion. On Bimanual exa the polypoid lesion is again noted, significant bleeding occurred during the exam.  Rectovaginal examination revealed no obvious pelvic masses other than this area in the mid and distal vagina.   Lab Findings: Lab Results  Component Value Date   WBC 12.3 (H) 03/18/2014   HGB 10.1 (L) 03/18/2014   HCT 31.1 (L) 03/18/2014   MCV 89.1 03/18/2014   PLT 321 03/18/2014      Radiographic Findings: No results found.  Impression: Exam is concerning for possible recurrence.  Patient will be  referred to gynecologic oncology for further evaluation.  Patient will call if she has significant bleeding and to go to the Emergency room if this occurs during the evening hours.  Plan:  Patient will see Dr. Fermin Schwab tomorrow for further evaluation. We will inform the patient's primary care physician of her elevated systolic blood pressure.  This document serves as a record of services personally performed by Gery Pray, MD. It was created on his behalf by Truddie Hidden, a trained medical scribe. The creation of this record is based on the scribe's personal observations and the provider's statements to them. This document has been checked and approved by the attending provider.      ____________________________________   [name], PhD, MD

## 2016-01-04 ENCOUNTER — Ambulatory Visit: Payer: Medicare HMO | Attending: Gynecology | Admitting: Gynecology

## 2016-01-04 ENCOUNTER — Encounter: Payer: Self-pay | Admitting: Gynecology

## 2016-01-04 ENCOUNTER — Other Ambulatory Visit (HOSPITAL_BASED_OUTPATIENT_CLINIC_OR_DEPARTMENT_OTHER): Payer: Medicare HMO

## 2016-01-04 VITALS — BP 203/57 | HR 70 | Temp 98.0°F | Resp 19 | Wt 180.4 lb

## 2016-01-04 DIAGNOSIS — E119 Type 2 diabetes mellitus without complications: Secondary | ICD-10-CM | POA: Insufficient documentation

## 2016-01-04 DIAGNOSIS — Z888 Allergy status to other drugs, medicaments and biological substances status: Secondary | ICD-10-CM | POA: Diagnosis not present

## 2016-01-04 DIAGNOSIS — F419 Anxiety disorder, unspecified: Secondary | ICD-10-CM | POA: Diagnosis not present

## 2016-01-04 DIAGNOSIS — K439 Ventral hernia without obstruction or gangrene: Secondary | ICD-10-CM | POA: Diagnosis not present

## 2016-01-04 DIAGNOSIS — C541 Malignant neoplasm of endometrium: Secondary | ICD-10-CM | POA: Insufficient documentation

## 2016-01-04 DIAGNOSIS — N898 Other specified noninflammatory disorders of vagina: Secondary | ICD-10-CM

## 2016-01-04 DIAGNOSIS — Z7984 Long term (current) use of oral hypoglycemic drugs: Secondary | ICD-10-CM | POA: Diagnosis not present

## 2016-01-04 DIAGNOSIS — Z9889 Other specified postprocedural states: Secondary | ICD-10-CM | POA: Insufficient documentation

## 2016-01-04 DIAGNOSIS — I2510 Atherosclerotic heart disease of native coronary artery without angina pectoris: Secondary | ICD-10-CM | POA: Insufficient documentation

## 2016-01-04 DIAGNOSIS — I700 Atherosclerosis of aorta: Secondary | ICD-10-CM | POA: Diagnosis not present

## 2016-01-04 DIAGNOSIS — I739 Peripheral vascular disease, unspecified: Secondary | ICD-10-CM | POA: Diagnosis not present

## 2016-01-04 DIAGNOSIS — K8020 Calculus of gallbladder without cholecystitis without obstruction: Secondary | ICD-10-CM | POA: Insufficient documentation

## 2016-01-04 DIAGNOSIS — C52 Malignant neoplasm of vagina: Secondary | ICD-10-CM | POA: Diagnosis not present

## 2016-01-04 DIAGNOSIS — J90 Pleural effusion, not elsewhere classified: Secondary | ICD-10-CM | POA: Diagnosis not present

## 2016-01-04 DIAGNOSIS — I10 Essential (primary) hypertension: Secondary | ICD-10-CM | POA: Insufficient documentation

## 2016-01-04 DIAGNOSIS — M1990 Unspecified osteoarthritis, unspecified site: Secondary | ICD-10-CM | POA: Insufficient documentation

## 2016-01-04 DIAGNOSIS — Z7982 Long term (current) use of aspirin: Secondary | ICD-10-CM | POA: Insufficient documentation

## 2016-01-04 DIAGNOSIS — Z79899 Other long term (current) drug therapy: Secondary | ICD-10-CM | POA: Diagnosis not present

## 2016-01-04 LAB — BASIC METABOLIC PANEL
Anion Gap: 10 mEq/L (ref 3–11)
BUN: 25.7 mg/dL (ref 7.0–26.0)
CHLORIDE: 109 meq/L (ref 98–109)
CO2: 25 meq/L (ref 22–29)
Calcium: 9.7 mg/dL (ref 8.4–10.4)
Creatinine: 1.9 mg/dL — ABNORMAL HIGH (ref 0.6–1.1)
EGFR: 24 mL/min/{1.73_m2} — ABNORMAL LOW (ref >90)
GLUCOSE: 138 mg/dL (ref 70–140)
Potassium: 4.1 mEq/L (ref 3.5–5.1)
Sodium: 143 mEq/L (ref 136–145)

## 2016-01-04 NOTE — Progress Notes (Signed)
Consult Note: Gyn-Onc   Debbie [name] 82 y.o. female  Chief Complaint  Patient presents with  . Endometrial cancer    New Consultation    Assessment :While unusual, think this is most likely recurrence of her endometrial carcinoma in the vagina.  Plan: Vaginal biopsies obtained today. We'll await the biopsy and Pap smear reports before making further plans. If this does appear to be recurrent disease I think we need to restage the patient with a PET CT scan and then plan further treatment appropriately. This was discussed with the patient and her son. All questions are answered.  Interval History: Patient is seen today because of recent discovery (yesterday) of a friable lesion in the vagina. I last saw the patient 6 years ago. She has been followed with Dr.Kinard on an annual basis. Yesterday on examination she is found to have a friable vaginal polypoid lesion. A Pap smear is obtained. The patient reports that she is only had minimal vaginal spotting. She denies any other pelvic symptoms such as pain or any other GI or GU symptoms. Her functional status is been relatively normal.  HPI: Patient underwent a total abdominal hysterectomy bilateral salpingo-oophorectomy pelvic and aortic lymphadenectomy in 2009 for grade 3 endometrial carcinoma. She was found to have a poorly differentiated carcinoma with inner half myometrial invasion and negative staging biopsies. She received high-dose rate brachytherapy to the vaginal vault. Subsequently she been followed with no evidence recurrent disease.  Review of Systems:10 point review of systems is negative except as noted in interval history.   Vitals: Blood pressure (!) 203/57, pulse 70, temperature 98 F (36.7 C), temperature source Oral, resp. rate 19, weight 180 lb 6.4 oz (81.8 kg), SpO2 95 %.  Physical Exam: General : The patient is a healthy woman in no acute distress.  HEENT: normocephalic, extraoccular movements normal; neck is supple  without thyromegally  Lynphnodes: Supraclavicular and inguinal nodes not enlarged  Abdomen: Soft, non-tender, no ascites, no organomegally, no masses, no hernias  Pelvic:  EGBUS: Normal female  Vagina: Normal, no lesions  Urethra and Bladder: Normal, non-tender  Cervix: Surgically absent  Uterus: Surgically absent  Bi-manual examination: Non-tender; no adenxal masses or nodularity  Rectal: normal sphincter tone, no masses, no blood  Lower extremities: No edema or varicosities. Normal range of motion      Allergies  Allergen Reactions  . Codeine Other (See Comments)    Unusual mouth sensation    Past Medical History:  Diagnosis Date  . Allergy   . Anxiety   . Arthritis   . Cancer ([place]) dx'd 12/2007   endometroid adenocarcinoma  . Cataract    both  . Diabetes mellitus    fasting [id number]  . Dizziness   . History of radiation therapy 9/21,9/28,10/05,05/20/2008   4 txs 2400 cGy endometrial adenocarcinoma  . Hypertension   . Peripheral vascular disease ([place])   . Seasonal allergies     Past Surgical History:  Procedure Laterality Date  . ABDOMINAL HYSTERECTOMY    . APPENDECTOMY    . ENDARTERECTOMY Left 01/12/2014   Procedure: LEFT CAROTID ARTERY ENDARTERECTOMY WITH HEMASHIELD PATCH ANGIOPLASTY;  Surgeon: Mal Misty, MD;  Location: [facility];  Service: Vascular;  Laterality: Left;  . ENDARTERECTOMY Right 03/17/2014   Procedure: RIGHT CAROTD ARTERY ENDARTERECTOMY WITH DACRON PATCH ANGIOPLASTY;  Surgeon: Mal Misty, MD;  Location: [facility];  Service: Vascular;  Laterality: Right;  . EYE SURGERY Bilateral    cataracts  . NASAL SEPTUM SURGERY    .  NECK SURGERY Bilateral    arterial  . TONSILLECTOMY      Current Outpatient Prescriptions  Medication Sig Dispense Refill  . amLODipine (NORVASC) 2.5 MG tablet   0  . aspirin EC 81 MG tablet Take 81 mg by mouth daily.    [name] atenolol (TENORMIN) 100 MG tablet Take 100 mg by mouth daily. Reported on 10/06/2015    . glimepiride  (AMARYL) 4 MG tablet Take 4 mg by mouth daily with breakfast.    . hydrALAZINE (APRESOLINE) 25 MG tablet Take 25 mg by mouth 2 (two) times daily.    [name] lisinopril-hydrochlorothiazide (PRINZIDE,ZESTORETIC) 20-25 MG per tablet Take 1 tablet by mouth daily.     . metFORMIN (GLUCOPHAGE) 1000 MG tablet Take 1,000 mg by mouth 2 (two) times daily with a meal.    . Multiple Vitamin (MULTIVITAMIN) tablet Take 1 tablet by mouth daily. Centrum silver    . simvastatin (ZOCOR) 80 MG tablet Take 80 mg by mouth at bedtime.    . Albuterol Sulfate (PROAIR RESPICLICK) 123XX123 (90 Base) MCG/ACT AEPB Inhale 1-2 puffs into the lungs every 4 (four) hours as needed. (Patient not taking: Reported on 01/03/2016) 1 each 1  . Loratadine (CLARITIN) 10 MG CAPS Take 10 mg by mouth daily as needed (for allergies). Reported on 10/06/2015     No current facility-administered medications for this visit.     Social History   Social History  . Marital status: Married    Spouse name: N/A  . Number of children: N/A  . Years of education: N/A   Occupational History  . Not on file.   Social History Main Topics  . Smoking status: Never Smoker  . Smokeless tobacco: Never Used  . Alcohol use No  . Drug use: No  . Sexual activity: Not on file   Other Topics Concern  . Not on file   Social History Narrative  . No narrative on file    Family History  Problem Relation Age of Onset  . Actinic keratosis Mother   . Cancer Mother     uterine  . Heart disease Father   . Hyperlipidemia Father   . Hypertension Father   . Stroke Father   . Cancer Sister     lung      Marti Sleigh, MD 01/04/2016, 9:25 AM

## 2016-01-04 NOTE — Addendum Note (Signed)
Addended by: Elizebeth Koller on: 01/04/2016 12:01 PM   Modules accepted: Orders

## 2016-01-04 NOTE — Patient Instructions (Signed)
We will call you with the results of your biopsy from today.  Plan to also have a PET scan to evaluate for metastatic disease.

## 2016-01-07 LAB — CYTOLOGY - PAP

## 2016-01-09 ENCOUNTER — Telehealth: Payer: Self-pay | Admitting: Gynecologic Oncology

## 2016-01-09 NOTE — Telephone Encounter (Signed)
Patient informed of biopsy results.  PET scan scheduled for August 9.  Discussed instructions for the PET scan.  Advised her that our office would call her when the results were available to discuss recommendations.  No concerns voiced.  Advised to call for any needs.

## 2016-01-16 ENCOUNTER — Ambulatory Visit (HOSPITAL_COMMUNITY)
Admission: RE | Admit: 2016-01-16 | Discharge: 2016-01-16 | Disposition: A | Discharge status: Home / Self Care | Payer: Medicare HMO | Source: Ambulatory Visit | Attending: Gynecologic Oncology | Admitting: Gynecologic Oncology

## 2016-01-16 ENCOUNTER — Telehealth: Payer: Self-pay | Admitting: Gynecologic Oncology

## 2016-01-16 DIAGNOSIS — I700 Atherosclerosis of aorta: Secondary | ICD-10-CM | POA: Diagnosis not present

## 2016-01-16 DIAGNOSIS — K8020 Calculus of gallbladder without cholecystitis without obstruction: Secondary | ICD-10-CM | POA: Insufficient documentation

## 2016-01-16 DIAGNOSIS — N898 Other specified noninflammatory disorders of vagina: Secondary | ICD-10-CM | POA: Insufficient documentation

## 2016-01-16 DIAGNOSIS — J90 Pleural effusion, not elsewhere classified: Secondary | ICD-10-CM | POA: Insufficient documentation

## 2016-01-16 DIAGNOSIS — R590 Localized enlarged lymph nodes: Secondary | ICD-10-CM | POA: Diagnosis not present

## 2016-01-16 DIAGNOSIS — I2510 Atherosclerotic heart disease of native coronary artery without angina pectoris: Secondary | ICD-10-CM | POA: Insufficient documentation

## 2016-01-16 DIAGNOSIS — K439 Ventral hernia without obstruction or gangrene: Secondary | ICD-10-CM | POA: Diagnosis not present

## 2016-01-16 LAB — GLUCOSE, CAPILLARY: Glucose-Capillary: 146 mg/dL — ABNORMAL HIGH (ref 65–99)

## 2016-01-16 MED ORDER — FLUDEOXYGLUCOSE F - 18 (FDG) INJECTION
10.0000 | Freq: Once | INTRAVENOUS | Status: AC | PRN
  Administered 2016-01-16: 10 via INTRAVENOUS

## 2016-01-16 NOTE — Telephone Encounter (Signed)
Patient notified of PET scan results and Dr. Mora Bellman recommendations to get back in with Dr. Sondra Come for radiation therapy to treat the vaginal recurrence.  Patient verbalizing understanding.  Advised she would be hearing from Dr. Clabe Seal office with an appointment and to call for any questions or concerns.

## 2016-01-18 NOTE — Progress Notes (Signed)
GYN Location of Tumor / Histology: recurrence of her endometrial carcinoma in the vagina.   Alric Ran presented with symptoms of: a friable vaginal polypoid lesion was found on exam on 01/03/16.  Biopsies revealed:   01/04/16 Diagnosis Vagina, biopsy - POSITIVE FOR POORLY DIFFERENTIATED CARCINOMA, SEE COMMENT.  Past/Anticipated interventions by Gyn/Onc surgery, if any: NO  Past/Anticipated interventions by medical oncology, if any: No  Weight changes, if any: no  Bowel/Bladder complaints, if any: Yes.  ,slight spoting redness on pad, bowels normal Pain issues, if any:  NO  SAFETY ISSUES: yes, slight unsteady  Slow ambulation  Prior radiation? 9/21,9/28,10/05,05/20/2008 - 4 txs 2400 cGy endometrial adenocarcinoma  Pacemaker/ICD?  NO}  Possible current pregnancy? N/A  Is the patient on methotrexate? NO  Current Complaints / other details:  PET scan done on 01/15/10. BP (!) 145/74 (BP Location: Left Arm, Patient Position: Sitting, Cuff Size: Normal)   Pulse 78   Temp 98.1 F (36.7 C) (Oral)   Resp 20   Ht 5\' 4"  (1.626 m)   Wt 186 lb 14.4 oz (84.8 kg)   SpO2 93% Comment: room air  BMI 32.08 kg/m   Wt Readings from Last 3 Encounters:  01/30/16 186 lb 14.4 oz (84.8 kg)  01/03/16 183 lb 14.4 oz (83.4 kg)  10/06/15 181 lb 3.2 oz (82.2 kg)

## 2016-01-29 NOTE — Progress Notes (Signed)
Radiation Oncology         [phone]) [phone] ________________________________  Name: Debbie [name] MRN: [id number]  Date: 01/30/2016  DOB: [birth year]  Re-Consulation Visit Note  CC: Myriam Jacobson, MD  Fermin Schwab Quillian Quince  Diagnosis: Recurrent endometrial adenocarcinoma (FIGO III), poorly differentiated  Interval Since Last Radiation: 7 years and 10 months  9/21,9/28,10/05,03/20/2008: 4 fractions of 24 Gy of vaginal brachytherapy alone after her surgery. Status post total abdominal hysterectomy bilateral salpingo-oophorectomy pelvic and aortic lymphadenectomy  Narrative:  The patient returns today for a re-consultation. During the patient's prior follow up on 01/03/16, the vaginal exam revealed a polypoid lesion along the left mid-distal vagina. It bled easily with palpation and I was unable to get a good view of the vaginal cuff area in light of her bleeding. A Pap smear was obtained of this polypoid lesion. On bimanual exam, the polypoid lesion is again noted, significant bleeding occurred during the exam. Rectovaginal examination revealed no obvious pelvic masses other than this area in the mid and distal vagina.  Pap smear of the polypoid lesion along the left vaginal wall on 01/03/16 was consistent with adenocarcinoma. On 01/04/16, the patient presented to Dr. Aldean Ast who preformed a biopsy. This was positive for recurrent endometrial adenocarcinoma (FIGO III), poorly differentiated.  PET scan on 01/16/16 revealed a 2.5 x 3.2 cm left vaginal wall mass with an SUV max of 34.9. There is no adjacent hypermetabolic adenopathy. Of note, there is a 1.0 cm minimally hypermetabolic left inguinal lymph nodes with an SUV max of 3.9. It is smaller than on 12/17/11 when it measured 1.6 cm.  The patient presents today to discuss the role of radiation in the management of her disease.                           ALLERGIES:  is allergic to codeine.  Meds: Current Outpatient Prescriptions    Medication Sig Dispense Refill  . amLODipine (NORVASC) 2.5 MG tablet   0  . aspirin EC 81 MG tablet Take 81 mg by mouth daily.    [name] atenolol (TENORMIN) 100 MG tablet Take 100 mg by mouth daily. Reported on 10/06/2015    . glimepiride (AMARYL) 4 MG tablet Take 4 mg by mouth daily with breakfast.    . hydrALAZINE (APRESOLINE) 25 MG tablet Take 25 mg by mouth 2 (two) times daily.    [name] lisinopril-hydrochlorothiazide (PRINZIDE,ZESTORETIC) 20-25 MG per tablet Take 1 tablet by mouth daily.     . metFORMIN (GLUCOPHAGE) 1000 MG tablet Take 1,000 mg by mouth 2 (two) times daily with a meal.    . Multiple Vitamin (MULTIVITAMIN) tablet Take 1 tablet by mouth daily. Centrum silver    . simvastatin (ZOCOR) 80 MG tablet Take 80 mg by mouth at bedtime.    . Albuterol Sulfate (PROAIR RESPICLICK) 123XX123 (90 Base) MCG/ACT AEPB Inhale 1-2 puffs into the lungs every 4 (four) hours as needed. (Patient not taking: Reported on 01/03/2016) 1 each 1  . Loratadine (CLARITIN) 10 MG CAPS Take 10 mg by mouth daily as needed (for allergies). Reported on 10/06/2015     No current facility-administered medications for this encounter.     Physical Findings: The patient is in no acute distress. Patient is alert and oriented.  height is 5\' 4"  (1.626 m) and weight is 186 lb 14.4 oz (84.8 kg). Her oral temperature is 98.1 F (36.7 C). Her blood pressure is 145/74 (abnormal) and her pulse is  78. Her respiration is 20 and oxygen saturation is 93%.  No palpable supraclavicular or axillary adenopathy. The lungs are clear to auscultation. The heart has regular rhythm and rate. The abdomen is soft and nontender with normal bowel sounds.. No inguinal adenopathy appreciated. Vaginal exam revealed polypoid lesion along the left mid-distal vagina.  The lesion extends from approximately the 12:00 position to the 7:00 position Bleeds easily with palpation.  Unable to get a good view of vaginal cuff area in light of her bleeding.      Lab  Findings: Lab Results  Component Value Date   WBC 12.3 (H) 03/18/2014   HGB 10.1 (L) 03/18/2014   HCT 31.1 (L) 03/18/2014   MCV 89.1 03/18/2014   PLT 321 03/18/2014      Radiographic Findings: Nm Pet Image Initial (pi) Skull Base To Thigh  Result Date: 01/16/2016 CLINICAL DATA:  Initial treatment strategy for vaginal lesion. EXAM: NUCLEAR MEDICINE PET SKULL BASE TO THIGH TECHNIQUE: 9.7 mCi F-18 FDG was injected intravenously. Full-ring PET imaging was performed from the skull base to thigh after the radiotracer. CT data was obtained and used for attenuation correction and anatomic localization. FASTING BLOOD GLUCOSE:  Value: 146 mg/dl COMPARISON:  CT abdomen pelvis 12/17/2011. FINDINGS: NECK No hypermetabolic lymph nodes in the neck. CT images show no acute findings. CHEST No hypermetabolic mediastinal, hilar or axillary lymph nodes. No hypermetabolic pulmonary nodules. Aortic and three-vessel coronary artery calcification. Heart is at the upper limits of normal in size to mildly enlarged. No pericardial effusion. Small simple appearing right pleural effusion. 4 mm nodule along the minor fissure, likely a subpleural lymph node, too small for PET resolution. Thyroid is heterogeneous and asymmetrically enlarged on the left. ABDOMEN/PELVIS There is abnormal hypermetabolism along the left vaginal wall, with an SUV max of 34.9, corresponding to a mass measuring approximately 2.5 x 3.2 cm. No adjacent hypermetabolic adenopathy. There is a a minimally hypermetabolic left inguinal lymph node, measuring 10 mm (CT image 178) with an SUV max of 3.9. It is smaller than on 12/17/2011, at which time it measured 1.6 cm. No abnormal hypermetabolism in the liver, adrenal glands, spleen or pancreas. No additional hypermetabolic lymph nodes. Liver is unremarkable. Small stones are seen in the gallbladder. Adrenal glands are unremarkable. Probable renal vascular calcifications bilaterally. Difficult to exclude tiny stones  in the right kidney. Low-attenuation lesions in the kidneys measure up to 1.5 cm on the left and are difficult to further characterize due to size and/or lack of IV contrast. Hyperdense lesions measure up to 1.5 cm, also difficult to further characterize. Stomach and bowel are grossly unremarkable. Upper midline ventral abdominal hernia contains fat, small. Supraumbilical left para midline ventral hernia could is small and contains fat. A larger left para midline hernia is seen along the ventral pelvic wall and contains unobstructed bowel. Atherosclerotic calcification of the arterial vasculature without abdominal aortic aneurysm. SKELETON No abnormal osseous hypermetabolism. IMPRESSION: 1. Hypermetabolic vaginal mass without evidence of metastatic disease. 2. Mildly hypermetabolic left inguinal lymph node, smaller in size than on 12/17/2011, nonspecific. 3. Small right pleural effusion. 4. Aortic atherosclerosis and three-vessel coronary artery calcification. 5. Cholelithiasis. 6. Difficult to exclude tiny right renal stones. 7. Ventral hernias, as discussed above. Electronically Signed   By: Lorin Picket M.D.   On: 01/16/2016 11:58   Impression: Recurrent endometrial adenocarcinoma (FIGO III), poorly differentiated. Recent PET scan shows that this area along the left vaginal wall is the only area of recurrence. Patient would be  a good candidate for a definitive course of radiation therapy primarily external beam light of her prior brachytherapy. Given the interval since recurrence however I would consider intracavitary brachytherapy at the completion of her external beam radiation therapy, most likely with the [facility] interstitial applicator to primarily treat the left mid and distal vagina.  I discussed the course of treatment side effects and potential long-term toxicities of radiation therapy with the patient and her son. She appears to understand and wishes to proceed with planned course of treatment. I am  recommending intensity modulated radiation therapy for her treatment in light of her prior hysterectomy with anticipated small bowel in the pelvic field. I'm also recommending IMRT in light of her previous radiation therapy to the pelvis area.    Plan:  Simulation and planning tomorrow at 3 PM with treatments to begin soon afterwards. Anticipate a external beam dose of approximately 45- 50.4 Gy directed at the pelvis area followed by intracavitary brachytherapy treatments as above.     ____________________________________   [name], PhD, MD  This document serves as a record of services personally performed by Gery Pray, MD. It was created on his behalf by Darcus Austin, a trained medical scribe. The creation of this record is based on the scribe's personal observations and the provider's statements to them. This document has been checked and approved by the attending provider.

## 2016-01-30 ENCOUNTER — Encounter: Payer: Self-pay | Admitting: Radiation Oncology

## 2016-01-30 ENCOUNTER — Ambulatory Visit
Admission: RE | Admit: 2016-01-30 | Discharge: 2016-01-30 | Disposition: A | Discharge status: Home / Self Care | Payer: Medicare HMO | Source: Ambulatory Visit | Attending: Radiation Oncology | Admitting: Radiation Oncology

## 2016-01-30 DIAGNOSIS — Z90722 Acquired absence of ovaries, bilateral: Secondary | ICD-10-CM | POA: Insufficient documentation

## 2016-01-30 DIAGNOSIS — Z7982 Long term (current) use of aspirin: Secondary | ICD-10-CM | POA: Diagnosis not present

## 2016-01-30 DIAGNOSIS — C541 Malignant neoplasm of endometrium: Secondary | ICD-10-CM | POA: Diagnosis present

## 2016-01-30 DIAGNOSIS — Z510 Encounter for antineoplastic radiation therapy: Secondary | ICD-10-CM | POA: Insufficient documentation

## 2016-01-30 DIAGNOSIS — Z9889 Other specified postprocedural states: Secondary | ICD-10-CM | POA: Insufficient documentation

## 2016-01-30 DIAGNOSIS — Z7984 Long term (current) use of oral hypoglycemic drugs: Secondary | ICD-10-CM | POA: Insufficient documentation

## 2016-01-30 DIAGNOSIS — C7982 Secondary malignant neoplasm of genital organs: Secondary | ICD-10-CM | POA: Insufficient documentation

## 2016-01-30 DIAGNOSIS — Z90710 Acquired absence of both cervix and uterus: Secondary | ICD-10-CM | POA: Diagnosis not present

## 2016-01-30 DIAGNOSIS — Z923 Personal history of irradiation: Secondary | ICD-10-CM | POA: Diagnosis not present

## 2016-01-31 ENCOUNTER — Ambulatory Visit
Admission: RE | Admit: 2016-01-31 | Discharge: 2016-01-31 | Disposition: A | Discharge status: Home / Self Care | Payer: Medicare HMO | Source: Ambulatory Visit | Attending: Radiation Oncology | Admitting: Radiation Oncology

## 2016-01-31 DIAGNOSIS — C7982 Secondary malignant neoplasm of genital organs: Secondary | ICD-10-CM

## 2016-01-31 DIAGNOSIS — Z510 Encounter for antineoplastic radiation therapy: Secondary | ICD-10-CM | POA: Diagnosis not present

## 2016-01-31 NOTE — Progress Notes (Signed)
  Radiation Oncology         ([phone]) [phone] ________________________________  Name: ALESHANEE RENARD MRN: [id number]  Date: 01/31/2016  DOB: 09/02/1930  SIMULATION AND TREATMENT PLANNING NOTE    ICD-9-CM ICD-10-CM   1. Secondary malignant neoplasm of vagina (HCC) 198.82 C79.82 Ambulatory referral to Home Health    DIAGNOSIS:  Recurrent endometrial adenocarcinoma (FIGO III), poorly differentiated  NARRATIVE:  The patient was brought to the [facility].  Identity was confirmed.  All relevant records and images related to the planned course of therapy were reviewed.  The patient freely provided informed written consent to proceed with treatment after reviewing the details related to the planned course of therapy. The consent form was witnessed and verified by the simulation staff.  Then, the patient was set-up in a stable reproducible  supine position for radiation therapy.  CT images were obtained.  Surface markings were placed.  The CT images were loaded into the planning software.  Then the target and avoidance structures were contoured.  Treatment planning then occurred.  The radiation prescription was entered and confirmed.  Then, I designed and supervised the construction of a total of 1 medically necessary complex treatment device.  I have requested : Intensity Modulated Radiotherapy (IMRT) is medically necessary for this case for the following reason: small bowel sparing and previous treatment to this area..  I have ordered: dose calc.  PLAN:  The patient will receive 45 Gy in 25 fractions directed at the pelvic nodal areas with a simultaneous integrated boost to 50 gray directed at the site of recurrence. Then we will likely proceed with 3-4 HDR treatments.  -----------------------------------  [name], PhD, MD  This document serves as a record of services personally performed by Gery Pray, MD. It was created on his behalf by Darcus Austin, a trained medical  scribe. The creation of this record is based on the scribe's personal observations and the provider's statements to them. This document has been checked and approved by the attending provider.

## 2016-02-01 NOTE — Addendum Note (Signed)
Encounter addended by: Jacqulyn Liner, RN on: 02/01/2016  9:17 AM<BR>    Actions taken: Charge Capture section accepted

## 2016-02-04 ENCOUNTER — Telehealth: Payer: Self-pay | Admitting: Radiation Oncology

## 2016-02-04 NOTE — Telephone Encounter (Signed)
I called Cyril Mourning back with [facility] and was told she had everything she needed to start [place] care for Ms. Pardi. Phn#[phone].

## 2016-02-05 DIAGNOSIS — Z510 Encounter for antineoplastic radiation therapy: Secondary | ICD-10-CM | POA: Diagnosis not present

## 2016-02-08 ENCOUNTER — Telehealth: Payer: Self-pay | Admitting: Oncology

## 2016-02-08 NOTE — Telephone Encounter (Signed)
Called Debbie [name] to follow up on home health referral.  He said he has not heard from anyone yet.  Advised him that [facility] will be called today.  Called and talked to [place] with [facility].  She said she had talked to the patient this week who does not want to start home health until next week.  Advised [place] to call patient's son, Debbie [name] to arrange care.  [place] verbalized agreement and will call Debbie [name] today.  Left a message for Debbie [name] letting him know that [facility] will be calling him today.

## 2016-02-12 ENCOUNTER — Ambulatory Visit: Payer: Medicare HMO

## 2016-02-12 ENCOUNTER — Ambulatory Visit
Admission: RE | Admit: 2016-02-12 | Discharge: 2016-02-12 | Disposition: A | Discharge status: Home / Self Care | Payer: Medicare HMO | Source: Ambulatory Visit | Attending: Radiation Oncology | Admitting: Radiation Oncology

## 2016-02-12 ENCOUNTER — Encounter: Payer: Self-pay | Admitting: Radiation Oncology

## 2016-02-12 VITALS — BP 195/65 | HR 82 | Temp 98.4°F | Resp 16 | Ht 64.0 in | Wt 184.4 lb

## 2016-02-12 DIAGNOSIS — Z510 Encounter for antineoplastic radiation therapy: Secondary | ICD-10-CM | POA: Diagnosis not present

## 2016-02-12 DIAGNOSIS — C549 Malignant neoplasm of corpus uteri, unspecified: Secondary | ICD-10-CM

## 2016-02-12 DIAGNOSIS — C7982 Secondary malignant neoplasm of genital organs: Secondary | ICD-10-CM

## 2016-02-12 NOTE — Progress Notes (Signed)
  Radiation Oncology         [phone] ________________________________  Name: Debbie [name] MRN: [id number]  Date: 02/12/2016  DOB: 1930/10/29  Weekly Radiation Therapy Management    ICD-9-CM ICD-10-CM   1. 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 . . . . . . . The patient presents for routine under treatment assessment.                                   The patient is without complaint. Vaginal bleeding has stopped since last exam                                 Set-up films were reviewed.                                 The chart was checked. Physical Findings. . .BP (!) 195/65 (BP Location: Right Arm, Patient Position: Sitting, Cuff Size: Normal)   Pulse 82   Temp 98.4 F (36.9 C) (Oral)   Resp 16   Ht 5\' 4"  (1.626 m)   Wt 184 lb 6.4 oz (83.6 kg)   SpO2 97%   BMI 31.65 kg/m .  Lungs clear. Heart regular rhythm and rate abdomen soft and nontender with normal bowel sounds Impression . . . . . . . The patient is tolerating radiation. Plan . . . . . . . . . . . . Continue treatment as planned.  ________________________________   [name], PhD, MD

## 2016-02-12 NOTE — Progress Notes (Signed)
  Radiation Oncology         [phone] ________________________________  Name: Debbie [name] MRN: [id number]  Date: 02/12/2016  DOB: 1930/11/09  Simulation Verification Note    ICD-9-CM ICD-10-CM   1. 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 The clinical setup was verified. Then port films were obtained and uploaded to the radiation oncology medical record software.  The treatment beams were carefully compared against the planned radiation fields. The position location and shape of the radiation fields was reviewed. They targeted volume of tissue appears to be appropriately covered by the radiation beams. Organs at risk appear to be excluded as planned.  Based on my personal review, I approved the simulation verification. The patient's treatment will proceed as planned.  -----------------------------------  [name], PhD, MD

## 2016-02-12 NOTE — Progress Notes (Signed)
Debbie [name] has received 1 fraction to her pelvis and vaginal HDR.  Appetite is very good. Denies pain or discomfort to the pelvic area or fatigue.   Having dribbling and incontinence, wears a pad.  Denies having diarrhea or nocturia. Education done today. Wt Readings from Last 3 Encounters:  02/12/16 184 lb 6.4 oz (83.6 kg)  01/30/16 186 lb 14.4 oz (84.8 kg)  01/03/16 183 lb 14.4 oz (83.4 kg)   BP (!) 195/65 (BP Location: Right Arm, Patient Position: Sitting, Cuff Size: Normal)   Pulse 82   Temp 98.4 F (36.9 C) (Oral)   Resp 16   Ht 5\' 4"  (1.626 m)   Wt 184 lb 6.4 oz (83.6 kg)   SpO2 97%   BMI 31.65 kg/m

## 2016-02-12 NOTE — Progress Notes (Deleted)
  Radiation Oncology         (336) [phone] ________________________________  Name: Debbie [name] MRN: [id number]  Date: 02/12/2016  DOB: 03-Feb-1931  Simulation Verification Note    ICD-9-CM ICD-10-CM   1. 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 The clinical setup was verified. Then port films were obtained and uploaded to the radiation oncology medical record software.  The treatment beams were carefully compared against the planned radiation fields. The position location and shape of the radiation fields was reviewed. They targeted volume of tissue appears to be appropriately covered by the radiation beams. Organs at risk appear to be excluded as planned.  Based on my personal review, I approved the simulation verification. The patient's treatment will proceed as planned.  -----------------------------------  [name], PhD, MD

## 2016-02-12 NOTE — Progress Notes (Signed)
  Radiation Oncology         [phone] ________________________________  Name: Debbie [name] MRN: [id number]  Date: 02/12/2016  DOB: Mar 19, 1931  Weekly Radiation Therapy Management    ICD-9-CM ICD-10-CM   1. 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 Malignant neoplasm of corpus uteri, except isthmus (HCC) 182.0 C54.9     Recurrent endometrial adenocarcinoma (FIGO III), poorly differentiated  Current Dose: 2 Gy     Planned Dose:  50+ Gy  Narrative . . . . . . . . The patient presents for routine under treatment assessment.                            Debbie [name] has received 1 fraction to her pelvis.  Appetite is very good. Denies pain or discomfort to the pelvic area or fatigue.   Having dribbling and incontinence, wears a pad.  Denies having diarrhea or nocturia.  She reports some vaginal bleeding, but it is now less.                                 Set-up films were reviewed.                                 The chart was checked. Physical Findings. . .  height is 5\' 4"  (1.626 m) and weight is 184 lb 6.4 oz (83.6 kg). Her oral temperature is 98.4 F (36.9 C). Her blood pressure is 195/65 (abnormal) and her pulse is 82. Her respiration is 16 and oxygen saturation is 97%. . Lungs are clear to auscultation bilaterally. Heart has regular rate and rhythm. Abdomen soft, non-tender, normal bowel sounds. Impression . . . . . . . The patient is tolerating radiation. Plan . . . . . . . . . . . . Continue treatment as planned. ________________________________   [name], PhD, MD  This document serves as a record of services personally performed by Gery Pray, MD. It was created on his behalf by Darcus Austin, a trained medical scribe. The creation of this record is based on the scribe's personal observations and the provider's statements to them. This document has been checked and approved by the attending provider.

## 2016-02-13 ENCOUNTER — Ambulatory Visit
Admission: RE | Admit: 2016-02-13 | Discharge: 2016-02-13 | Disposition: A | Discharge status: Home / Self Care | Payer: Medicare HMO | Source: Ambulatory Visit | Attending: Radiation Oncology | Admitting: Radiation Oncology

## 2016-02-13 DIAGNOSIS — Z510 Encounter for antineoplastic radiation therapy: Secondary | ICD-10-CM | POA: Diagnosis not present

## 2016-02-14 ENCOUNTER — Ambulatory Visit
Admission: RE | Admit: 2016-02-14 | Discharge: 2016-02-14 | Disposition: A | Discharge status: Home / Self Care | Payer: Medicare HMO | Source: Ambulatory Visit | Attending: Radiation Oncology | Admitting: Radiation Oncology

## 2016-02-14 DIAGNOSIS — Z510 Encounter for antineoplastic radiation therapy: Secondary | ICD-10-CM | POA: Diagnosis not present

## 2016-02-15 ENCOUNTER — Ambulatory Visit
Admission: RE | Admit: 2016-02-15 | Discharge: 2016-02-15 | Disposition: A | Discharge status: Home / Self Care | Payer: Medicare HMO | Source: Ambulatory Visit | Attending: Radiation Oncology | Admitting: Radiation Oncology

## 2016-02-15 DIAGNOSIS — Z510 Encounter for antineoplastic radiation therapy: Secondary | ICD-10-CM | POA: Diagnosis not present

## 2016-02-18 ENCOUNTER — Ambulatory Visit
Admission: RE | Admit: 2016-02-18 | Discharge: 2016-02-18 | Disposition: A | Discharge status: Home / Self Care | Payer: Medicare HMO | Source: Ambulatory Visit | Attending: Radiation Oncology | Admitting: Radiation Oncology

## 2016-02-18 DIAGNOSIS — Z510 Encounter for antineoplastic radiation therapy: Secondary | ICD-10-CM | POA: Diagnosis not present

## 2016-02-19 ENCOUNTER — Ambulatory Visit
Admission: RE | Admit: 2016-02-19 | Discharge: 2016-02-19 | Disposition: A | Discharge status: Home / Self Care | Payer: Medicare HMO | Source: Ambulatory Visit | Attending: Radiation Oncology | Admitting: Radiation Oncology

## 2016-02-19 ENCOUNTER — Encounter: Payer: Self-pay | Admitting: Radiation Oncology

## 2016-02-19 VITALS — BP 159/75 | HR 65 | Temp 98.0°F | Ht 64.0 in | Wt 184.0 lb

## 2016-02-19 DIAGNOSIS — C7982 Secondary malignant neoplasm of genital organs: Secondary | ICD-10-CM

## 2016-02-19 DIAGNOSIS — Z510 Encounter for antineoplastic radiation therapy: Secondary | ICD-10-CM | POA: Diagnosis not present

## 2016-02-19 NOTE — Progress Notes (Signed)
Debbie [name] has completed 6 fractions to her pelvis.  She denies having pain.  She denies having any urinary issues, bowel issues or nausea.  She denies having any vaginal bleeding.  She reports having fatigue.  BP (!) 156/109 (BP Location: Left Arm, Patient Position: Sitting)   Pulse 76   Temp 98 F (36.7 C) (Oral)   Ht 5\' 4"  (1.626 m)   Wt 184 lb (83.5 kg)   SpO2 99%   BMI 31.58 kg/m    Wt Readings from Last 3 Encounters:  02/19/16 184 lb (83.5 kg)  02/12/16 184 lb 6.4 oz (83.6 kg)  01/30/16 186 lb 14.4 oz (84.8 kg)

## 2016-02-19 NOTE — Progress Notes (Signed)
  Radiation Oncology         (336) [phone] ________________________________  Name: Debbie [name] MRN: [id number]  Date: 02/19/2016  DOB: [birth year]  Weekly Radiation Therapy Management    ICD-9-CM ICD-10-CM   1. 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 . . . . . . . The patient presents for routine under treatment assessment.                           Debbie [name] has completed 6 fractions to her pelvis.  She denies having pain.  She denies having any urinary issues, bowel issues or nausea.  She denies having any vaginal bleeding.  She reports having fatigue.                                Set-up films were reviewed.                                 The chart was checked. Physical Findings. . .  height is 5\' 4"  (1.626 m) and weight is 184 lb (83.5 kg). Her oral temperature is 98 F (36.7 C). Her blood pressure is 159/75 (abnormal) and her pulse is 65. Her oxygen saturation is 99%. . Lungs are clear to auscultation bilaterally. Heart has regular rate and rhythm. Abdomen soft, non-tender, normal bowel sounds. Impression . . . . . . . The patient is tolerating radiation. Plan . . . . . . . . . . . . Continue treatment as planned. ________________________________   [name], PhD, MD  This document serves as a record of services personally performed by Gery Pray, MD. It was created on his behalf by Truddie Hidden, a trained medical scribe. The creation of this record is based on the scribe's personal observations and the provider's statements to them. This document has been checked and approved by the attending provider.

## 2016-02-20 ENCOUNTER — Ambulatory Visit
Admission: RE | Admit: 2016-02-20 | Discharge: 2016-02-20 | Disposition: A | Discharge status: Home / Self Care | Payer: Medicare HMO | Source: Ambulatory Visit | Attending: Radiation Oncology | Admitting: Radiation Oncology

## 2016-02-20 DIAGNOSIS — Z510 Encounter for antineoplastic radiation therapy: Secondary | ICD-10-CM | POA: Diagnosis not present

## 2016-02-21 ENCOUNTER — Ambulatory Visit
Admission: RE | Admit: 2016-02-21 | Discharge: 2016-02-21 | Disposition: A | Discharge status: Home / Self Care | Payer: Medicare HMO | Source: Ambulatory Visit | Attending: Radiation Oncology | Admitting: Radiation Oncology

## 2016-02-21 DIAGNOSIS — Z510 Encounter for antineoplastic radiation therapy: Secondary | ICD-10-CM | POA: Diagnosis not present

## 2016-02-22 ENCOUNTER — Ambulatory Visit: Payer: Medicare HMO

## 2016-02-25 ENCOUNTER — Ambulatory Visit: Payer: Medicare HMO

## 2016-02-26 ENCOUNTER — Ambulatory Visit
Admission: RE | Admit: 2016-02-26 | Discharge: 2016-02-26 | Disposition: A | Discharge status: Home / Self Care | Payer: Medicare HMO | Source: Ambulatory Visit | Attending: Radiation Oncology | Admitting: Radiation Oncology

## 2016-02-26 ENCOUNTER — Ambulatory Visit: Admission: RE | Admit: 2016-02-26 | Payer: Medicare HMO | Source: Ambulatory Visit | Admitting: Radiation Oncology

## 2016-02-26 DIAGNOSIS — Z510 Encounter for antineoplastic radiation therapy: Secondary | ICD-10-CM | POA: Diagnosis not present

## 2016-02-27 ENCOUNTER — Ambulatory Visit
Admission: RE | Admit: 2016-02-27 | Discharge: 2016-02-27 | Disposition: A | Discharge status: Home / Self Care | Payer: Medicare HMO | Source: Ambulatory Visit | Attending: Radiation Oncology | Admitting: Radiation Oncology

## 2016-02-27 DIAGNOSIS — Z510 Encounter for antineoplastic radiation therapy: Secondary | ICD-10-CM | POA: Diagnosis not present

## 2016-02-28 ENCOUNTER — Ambulatory Visit
Admission: RE | Admit: 2016-02-28 | Discharge: 2016-02-28 | Disposition: A | Discharge status: Home / Self Care | Payer: Medicare HMO | Source: Ambulatory Visit | Attending: Radiation Oncology | Admitting: Radiation Oncology

## 2016-02-28 DIAGNOSIS — Z510 Encounter for antineoplastic radiation therapy: Secondary | ICD-10-CM | POA: Diagnosis not present

## 2016-02-29 ENCOUNTER — Ambulatory Visit
Admission: RE | Admit: 2016-02-29 | Discharge: 2016-02-29 | Disposition: A | Discharge status: Home / Self Care | Payer: Medicare HMO | Source: Ambulatory Visit | Attending: Radiation Oncology | Admitting: Radiation Oncology

## 2016-02-29 DIAGNOSIS — Z510 Encounter for antineoplastic radiation therapy: Secondary | ICD-10-CM | POA: Diagnosis not present

## 2016-03-03 ENCOUNTER — Ambulatory Visit
Admission: RE | Admit: 2016-03-03 | Discharge: 2016-03-03 | Disposition: A | Discharge status: Home / Self Care | Payer: Medicare HMO | Source: Ambulatory Visit | Attending: Radiation Oncology | Admitting: Radiation Oncology

## 2016-03-03 DIAGNOSIS — Z510 Encounter for antineoplastic radiation therapy: Secondary | ICD-10-CM | POA: Diagnosis not present

## 2016-03-04 ENCOUNTER — Encounter: Payer: Self-pay | Admitting: Radiation Oncology

## 2016-03-04 ENCOUNTER — Ambulatory Visit
Admission: RE | Admit: 2016-03-04 | Discharge: 2016-03-04 | Disposition: A | Discharge status: Home / Self Care | Payer: Medicare HMO | Source: Ambulatory Visit | Attending: Radiation Oncology | Admitting: Radiation Oncology

## 2016-03-04 VITALS — BP 114/82 | HR 75 | Temp 98.2°F | Ht 64.0 in | Wt 185.3 lb

## 2016-03-04 DIAGNOSIS — C7982 Secondary malignant neoplasm of genital organs: Secondary | ICD-10-CM

## 2016-03-04 DIAGNOSIS — C549 Malignant neoplasm of corpus uteri, unspecified: Secondary | ICD-10-CM

## 2016-03-04 DIAGNOSIS — Z510 Encounter for antineoplastic radiation therapy: Secondary | ICD-10-CM | POA: Diagnosis not present

## 2016-03-04 NOTE — Progress Notes (Signed)
Debbie [name] has completed 14 fractions to her pelvis.  She denies having pain or any bladder issues.  She reports having diarrhea once during the night on Friday.  She denies having any vaginal or rectal bleeding.  She reports having more fatigue and was dizzy when she got up from the radiation table today.  She is wondering if she can get a flu shot.    BP 114/82 (BP Location: Left Arm, Patient Position: Sitting)   Pulse 75   Temp 98.2 F (36.8 C) (Oral)   Ht 5\' 4"  (1.626 m)   Wt 185 lb 4.8 oz (84.1 kg)   SpO2 100%   BMI 31.81 kg/m    Wt Readings from Last 3 Encounters:  03/04/16 185 lb 4.8 oz (84.1 kg)  02/19/16 184 lb (83.5 kg)  02/12/16 184 lb 6.4 oz (83.6 kg)

## 2016-03-04 NOTE — Progress Notes (Signed)
  Radiation Oncology         [phone] ________________________________  Name: Debbie [name] MRN: [id number]  Date: 03/04/2016  DOB: 1930-08-15  Weekly Radiation Therapy Management    ICD-9-CM ICD-10-CM   1. 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 Malignant neoplasm of corpus uteri, except isthmus (HCC) 182.0 C54.9      Current Dose: 25.2 Gy     Planned Dose:  50+ Gy  Narrative . . . . . . . . The patient presents for routine under treatment assessment.                                  [name] has completed 14 fractions to her pelvis.  She denies having pain or any bladder issues.  She reports having diarrhea once during the night on Friday.  She denies having any vaginal or rectal bleeding.  She reports having more fatigue and was dizzy when she got up from the radiation table today.  She is wondering if she can get a flu shot.                                   Set-up films were reviewed.                                 The chart was checked. Physical Findings. . .  height is 5\' 4"  (1.626 m) and weight is 185 lb 4.8 oz (84.1 kg). Her oral temperature is 98.2 F (36.8 C). Her blood pressure is 114/82 and her pulse is 75. Her oxygen saturation is 100%. . The lungs are clear. The heart has a regular rhythm and rate. The abdomen is soft and nontender with normal bowel sounds. Impression . . . . . . . The patient is tolerating radiation. Plan . . . . . . . . . . . . Continue treatment as planned.  Patient will proceed with getting a flu shot.  ________________________________   [name], PhD, MD

## 2016-03-05 ENCOUNTER — Ambulatory Visit
Admission: RE | Admit: 2016-03-05 | Discharge: 2016-03-05 | Disposition: A | Discharge status: Home / Self Care | Payer: Medicare HMO | Source: Ambulatory Visit | Attending: Radiation Oncology | Admitting: Radiation Oncology

## 2016-03-05 DIAGNOSIS — Z510 Encounter for antineoplastic radiation therapy: Secondary | ICD-10-CM | POA: Diagnosis not present

## 2016-03-06 ENCOUNTER — Ambulatory Visit
Admission: RE | Admit: 2016-03-06 | Discharge: 2016-03-06 | Disposition: A | Discharge status: Home / Self Care | Payer: Medicare HMO | Source: Ambulatory Visit | Attending: Radiation Oncology | Admitting: Radiation Oncology

## 2016-03-06 DIAGNOSIS — Z510 Encounter for antineoplastic radiation therapy: Secondary | ICD-10-CM | POA: Diagnosis not present

## 2016-03-07 ENCOUNTER — Ambulatory Visit
Admission: RE | Admit: 2016-03-07 | Discharge: 2016-03-07 | Disposition: A | Discharge status: Home / Self Care | Payer: Medicare HMO | Source: Ambulatory Visit | Attending: Radiation Oncology | Admitting: Radiation Oncology

## 2016-03-07 DIAGNOSIS — Z510 Encounter for antineoplastic radiation therapy: Secondary | ICD-10-CM | POA: Diagnosis not present

## 2016-03-10 ENCOUNTER — Ambulatory Visit: Payer: Medicare HMO

## 2016-03-10 ENCOUNTER — Ambulatory Visit
Admission: RE | Admit: 2016-03-10 | Discharge: 2016-03-10 | Disposition: A | Discharge status: Home / Self Care | Payer: Medicare HMO | Source: Ambulatory Visit | Attending: Radiation Oncology | Admitting: Radiation Oncology

## 2016-03-10 DIAGNOSIS — Z510 Encounter for antineoplastic radiation therapy: Secondary | ICD-10-CM | POA: Diagnosis not present

## 2016-03-11 ENCOUNTER — Ambulatory Visit
Admission: RE | Admit: 2016-03-11 | Discharge: 2016-03-11 | Disposition: A | Discharge status: Home / Self Care | Payer: Medicare HMO | Source: Ambulatory Visit | Attending: Radiation Oncology | Admitting: Radiation Oncology

## 2016-03-11 ENCOUNTER — Encounter: Payer: Self-pay | Admitting: Radiation Oncology

## 2016-03-11 VITALS — BP 113/65 | HR 72 | Temp 97.9°F | Ht 64.0 in | Wt 183.4 lb

## 2016-03-11 DIAGNOSIS — C7982 Secondary malignant neoplasm of genital organs: Secondary | ICD-10-CM

## 2016-03-11 DIAGNOSIS — Z510 Encounter for antineoplastic radiation therapy: Secondary | ICD-10-CM | POA: Diagnosis not present

## 2016-03-11 DIAGNOSIS — C549 Malignant neoplasm of corpus uteri, unspecified: Secondary | ICD-10-CM

## 2016-03-11 NOTE — Progress Notes (Signed)
  Radiation Oncology         (336) [phone] ________________________________  Name: Debbie [name] MRN: [id number]  Date: 03/11/2016  DOB: December 11, 1930  Weekly Radiation Therapy Management    ICD-9-CM ICD-10-CM   1. 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 Malignant neoplasm of corpus uteri, except isthmus (HCC) 182.0 C54.9      Current Dose: 38.0 Gy     Planned Dose:  50+ Gy  Narrative . . . . . . . . The patient presents for routine under treatment assessment.                                  Debbie [name] is here for her 19th fraction of radiation to her Pelvis. She denies pain. She does report fatigue, especially in the afternoon. She denies any urinary symptoms like frequency or burning. She reports diarrhea that started about 2 weeks ago. She is having about 1-2 stools daily. She is taking liquid imodium daily. She reports her radiation site is normal without any skin irritation or itching. She is not using the cream at this time. The patient denies any instances of nausea. No vaginal bleeding                                   Set-up films were reviewed.                                 The chart was checked. Physical Findings. . .  height is 5\' 4"  (1.626 m) and weight is 183 lb 6.4 oz (83.2 kg). Her temperature is 97.9 F (36.6 C). Her blood pressure is 113/65 and her pulse is 72. Her oxygen saturation is 100%. . The lungs are clear. The heart has a regular rhythm and rate. The abdomen is soft and nontender with normal bowel sounds. Impression . . . . . . . The patient is tolerating radiation. Plan . . . . . . . . . . . . Continue treatment as planned.    ________________________________   [name], PhD, MD  This document serves as a record of services personally performed by Gery Pray, MD. It was created on his behalf by Truddie Hidden, a trained medical scribe. The creation of this record is based on the scribe's personal observations and the  provider's statements to them. This document has been checked and approved by the attending provider.

## 2016-03-11 NOTE — Progress Notes (Signed)
Debbie [name] is here for her 19th fraction of radiation to her Pelvis. She denies pain. She does report fatigue, especially in the afternoon. She denies any urinary symptoms like frequency or burning. She reports diarrhea that started about 2 weeks ago. She is having about 1-2 stools daily. She is taking liquid imodium daily. She reports her radiation site is normal without any skin irritation or itching. She is not using the cream at this time.   BP 113/65   Pulse 72   Temp 97.9 F (36.6 C)   Ht 5\' 4"  (1.626 m)   Wt 183 lb 6.4 oz (83.2 kg)   SpO2 100% Comment: room air  BMI 31.48 kg/m    Wt Readings from Last 3 Encounters:  03/11/16 183 lb 6.4 oz (83.2 kg)  03/04/16 185 lb 4.8 oz (84.1 kg)  02/19/16 184 lb (83.5 kg)

## 2016-03-12 ENCOUNTER — Ambulatory Visit
Admission: RE | Admit: 2016-03-12 | Discharge: 2016-03-12 | Disposition: A | Discharge status: Home / Self Care | Payer: Medicare HMO | Source: Ambulatory Visit | Attending: Radiation Oncology | Admitting: Radiation Oncology

## 2016-03-12 DIAGNOSIS — Z510 Encounter for antineoplastic radiation therapy: Secondary | ICD-10-CM | POA: Diagnosis not present

## 2016-03-13 ENCOUNTER — Ambulatory Visit
Admission: RE | Admit: 2016-03-13 | Discharge: 2016-03-13 | Disposition: A | Discharge status: Home / Self Care | Payer: Medicare HMO | Source: Ambulatory Visit | Attending: Radiation Oncology | Admitting: Radiation Oncology

## 2016-03-13 DIAGNOSIS — Z510 Encounter for antineoplastic radiation therapy: Secondary | ICD-10-CM | POA: Diagnosis not present

## 2016-03-14 ENCOUNTER — Ambulatory Visit
Admission: RE | Admit: 2016-03-14 | Discharge: 2016-03-14 | Disposition: A | Discharge status: Home / Self Care | Payer: Medicare HMO | Source: Ambulatory Visit | Attending: Radiation Oncology | Admitting: Radiation Oncology

## 2016-03-14 DIAGNOSIS — Z510 Encounter for antineoplastic radiation therapy: Secondary | ICD-10-CM | POA: Diagnosis not present

## 2016-03-17 ENCOUNTER — Ambulatory Visit
Admission: RE | Admit: 2016-03-17 | Discharge: 2016-03-17 | Disposition: A | Discharge status: Home / Self Care | Payer: Medicare HMO | Source: Ambulatory Visit | Attending: Radiation Oncology | Admitting: Radiation Oncology

## 2016-03-17 ENCOUNTER — Ambulatory Visit: Payer: Medicare HMO

## 2016-03-17 DIAGNOSIS — Z510 Encounter for antineoplastic radiation therapy: Secondary | ICD-10-CM | POA: Diagnosis not present

## 2016-03-18 ENCOUNTER — Ambulatory Visit: Payer: Medicare HMO

## 2016-03-18 ENCOUNTER — Encounter: Payer: Self-pay | Admitting: Radiation Oncology

## 2016-03-18 ENCOUNTER — Ambulatory Visit
Admission: RE | Admit: 2016-03-18 | Discharge: 2016-03-18 | Disposition: A | Discharge status: Home / Self Care | Payer: Medicare HMO | Source: Ambulatory Visit | Attending: Radiation Oncology | Admitting: Radiation Oncology

## 2016-03-18 VITALS — BP 92/69 | HR 67 | Temp 98.6°F | Ht 64.0 in | Wt 177.8 lb

## 2016-03-18 DIAGNOSIS — C7982 Secondary malignant neoplasm of genital organs: Secondary | ICD-10-CM

## 2016-03-18 DIAGNOSIS — Z510 Encounter for antineoplastic radiation therapy: Secondary | ICD-10-CM | POA: Diagnosis not present

## 2016-03-18 NOTE — Progress Notes (Signed)
  Radiation Oncology         [phone] ________________________________  Name: Debbie [name] MRN: [id number]  Date: 03/18/2016  DOB: 04-12-1931  Weekly Radiation Therapy Management    ICD-9-CM ICD-10-CM   1. 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 . . . . . . . The patient presents for routine under treatment assessment.                                  [name] is here for her 19th fraction of radiation to her Pelvis. She denies pain. She does report fatigue, especially in the afternoon. She denies any urinary symptoms like frequency or burning. She reports diarrhea that started about 2 weeks ago. She is having about 1-2 stools daily. She is taking liquid imodium daily. She reports her radiation site is normal without any skin irritation or itching. She is not using the cream at this time. The patient denies any instances of nausea. No vaginal bleeding                                   Set-up films were reviewed.                                 The chart was checked. Physical Findings. . .  height is 5\' 4"  (1.626 m) and weight is 177 lb 12.8 oz (80.6 kg). Her oral temperature is 98.6 F (37 C). Her blood pressure is 92/69 and her pulse is 67. Her oxygen saturation is 99%. . The lungs are clear. The heart has a regular rhythm and rate. The abdomen is soft and nontender with normal bowel sounds. Impression . . . . . . . The patient is tolerating radiation. Plan . . . . . . . . . . . . Continue treatment as planned.  Pelvic exam tomorrow to determine which type of implant device to use for her brachytherapy.  ________________________________   [name], PhD, MD  This document serves as a record of services personally performed by Gery Pray, MD. It was created on his behalf by Truddie Hidden, a trained medical scribe. The creation of this record is based on the scribe's personal  observations and the provider's statements to them. This document has been checked and approved by the attending provider.

## 2016-03-18 NOTE — Progress Notes (Signed)
Ree has completed 24 fractions to her pelvis.  She denies having any new bladder issues.  She continues to report having urinary incontinence.  She reports having diarrhea occasionally.  Friday she reports having 2 episodes and took Imodium which helped.  She denies having nausea and reports having a good appetite.  She denies having any vaginal bleeding.  Her bp was low today at 92/69.  She is taking multiple bp medications and has lost 5 lbs since last week.  She reports having fatigue.  BP 92/69 (BP Location: Left Arm, Patient Position: Sitting)   Pulse 67   Temp 98.6 F (37 C) (Oral)   Ht 5\' 4"  (1.626 m)   Wt 177 lb 12.8 oz (80.6 kg)   SpO2 99%   BMI 30.52 kg/m    Wt Readings from Last 3 Encounters:  03/18/16 177 lb 12.8 oz (80.6 kg)  03/11/16 183 lb 6.4 oz (83.2 kg)  03/04/16 185 lb 4.8 oz (84.1 kg)

## 2016-03-19 ENCOUNTER — Ambulatory Visit: Payer: Medicare HMO

## 2016-03-19 ENCOUNTER — Ambulatory Visit
Admission: RE | Admit: 2016-03-19 | Discharge: 2016-03-19 | Disposition: A | Discharge status: Home / Self Care | Payer: Medicare HMO | Source: Ambulatory Visit | Attending: Radiation Oncology | Admitting: Radiation Oncology

## 2016-03-19 ENCOUNTER — Encounter: Payer: Self-pay | Admitting: Radiation Oncology

## 2016-03-19 VITALS — BP 142/82 | HR 60 | Temp 98.1°F

## 2016-03-19 DIAGNOSIS — C7982 Secondary malignant neoplasm of genital organs: Secondary | ICD-10-CM

## 2016-03-19 DIAGNOSIS — Z510 Encounter for antineoplastic radiation therapy: Secondary | ICD-10-CM | POA: Diagnosis not present

## 2016-03-19 NOTE — Progress Notes (Signed)
  Radiation Oncology         [phone] ________________________________  Name: Debbie [name] MRN: [id number]  Date: 03/19/2016  DOB: [birth year]  Weekly Radiation Therapy Management    ICD-9-CM ICD-10-CM   1. 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 . . . . . . . The patient presents for routine under treatment assessment.                            The patient has fatigue from her external beam radiation, but would like to continue with vaginal brachytherapy treatment. She also presents today for a pelvic exam to help in planning her brachytherapy treatments.                                   Set-up films were reviewed.                                 The chart was checked. Physical Findings. . .  oral temperature is 98.1 F (36.7 C). Her blood pressure is 142/82 (abnormal) and her pulse is 60. Her oxygen saturation is 99%. . Pelvic exam was performed. A medium speculum was used. The patient had significant shrinkage of the left lateral distal vaginal side wall tumor. There is no disease noted in the vaginal cuff area. No bleeding on exam. Impression . . . . . . . The patient had an excellent response to her external beam radiation with significant shrinkage of the tumor. Plan . . . . . . . . . . . .  She will now proceed with intracavitary brachytherapy treatments with the [facility] applicator to treat the residual tumor along the left lateral distal vaginal area and to limit dose to the bladder and rectal area. She will return the week of 03/31/16 to start her  brachytherapy treatment. ________________________________   [name], PhD, MD  This document serves as a record of services personally performed by Gery Pray, MD. It was created on his behalf by Darcus Austin, a trained medical scribe. The creation of this record is based on the scribe's personal observations and the provider's  statements to them. This document has been checked and approved by the attending provider.

## 2016-03-20 ENCOUNTER — Ambulatory Visit: Payer: Medicare HMO

## 2016-04-01 ENCOUNTER — Telehealth: Payer: Self-pay | Admitting: *Deleted

## 2016-04-01 ENCOUNTER — Telehealth: Payer: Self-pay | Admitting: Oncology

## 2016-04-01 NOTE — Telephone Encounter (Signed)
Called Debbie [name] back and advised her that she did not have a pap smear at her last appointment but she did have a pelvic exam that showed that her tumor has shrunk.  Quintana verbalized understanding and agreement.

## 2016-04-01 NOTE — Telephone Encounter (Signed)
Called Debbie [name] to see how she is feeling and if she is ready to start the [place] treatments.  Debbie [name] said she is feeling fine and asked about her last pap smear results.  Advised her that I do not see any recent pap smear results but will ask Dr. Sondra Come.  Debbie [name] said she was ready to start the [place] treatments next week.

## 2016-04-01 NOTE — Telephone Encounter (Signed)
CALLED PATIENT TO INFORM OF HDR CAPRI CASE FOR 04/09/16, 04/15/16, AND 04/22/16, SPOKE WITH PATIENT AND SHE IS AWARE OF THESE APPTS.

## 2016-04-08 ENCOUNTER — Telehealth: Payer: Self-pay | Admitting: *Deleted

## 2016-04-08 NOTE — Telephone Encounter (Signed)
CALLED PATIENT TO REMIND OF HDR CAPRI FOR 04-09-16, LVM FOR A RETURN CALL

## 2016-04-09 ENCOUNTER — Ambulatory Visit
Admission: RE | Admit: 2016-04-09 | Discharge: 2016-04-09 | Disposition: A | Discharge status: Home / Self Care | Payer: Medicare HMO | Source: Ambulatory Visit | Attending: Radiation Oncology | Admitting: Radiation Oncology

## 2016-04-09 ENCOUNTER — Encounter: Payer: Self-pay | Admitting: Radiation Oncology

## 2016-04-09 ENCOUNTER — Ambulatory Visit: Payer: Medicare HMO | Admitting: Radiation Oncology

## 2016-04-09 DIAGNOSIS — Z923 Personal history of irradiation: Secondary | ICD-10-CM | POA: Diagnosis not present

## 2016-04-09 DIAGNOSIS — Z7984 Long term (current) use of oral hypoglycemic drugs: Secondary | ICD-10-CM | POA: Insufficient documentation

## 2016-04-09 DIAGNOSIS — Z90722 Acquired absence of ovaries, bilateral: Secondary | ICD-10-CM | POA: Insufficient documentation

## 2016-04-09 DIAGNOSIS — Z7982 Long term (current) use of aspirin: Secondary | ICD-10-CM | POA: Insufficient documentation

## 2016-04-09 DIAGNOSIS — C541 Malignant neoplasm of endometrium: Secondary | ICD-10-CM | POA: Insufficient documentation

## 2016-04-09 DIAGNOSIS — Z90710 Acquired absence of both cervix and uterus: Secondary | ICD-10-CM | POA: Insufficient documentation

## 2016-04-09 DIAGNOSIS — Z79899 Other long term (current) drug therapy: Secondary | ICD-10-CM | POA: Insufficient documentation

## 2016-04-09 DIAGNOSIS — C7982 Secondary malignant neoplasm of genital organs: Secondary | ICD-10-CM

## 2016-04-09 DIAGNOSIS — Z9889 Other specified postprocedural states: Secondary | ICD-10-CM | POA: Diagnosis not present

## 2016-04-09 DIAGNOSIS — Z510 Encounter for antineoplastic radiation therapy: Secondary | ICD-10-CM | POA: Insufficient documentation

## 2016-04-09 DIAGNOSIS — Z885 Allergy status to narcotic agent status: Secondary | ICD-10-CM | POA: Diagnosis not present

## 2016-04-09 NOTE — Progress Notes (Signed)
Radiation Oncology         [phone] ________________________________  Name: Debbie [name] MRN: [id number]  Date: 04/09/2016  DOB: Aug 04, 1930  Vaginal Brachytherapy Procedure Note  CC: Myriam Jacobson, MD Marti Sleigh    ICD-9-CM ICD-10-CM   1. Secondary malignant neoplasm of vagina [facility]) 198.82 C79.82     Diagnosis: Recurrent endometrial adenocarcinoma (FIGO III), poorly differentiated  Radiation Treatment Dates:  02/12/16 - 03/19/16: 50 Gy in 25 fractions to the pelvis.  9/21,9/28,10/05,03/20/2008: 4 fractions of 24 Gy of vaginal brachytherapy alone after her surgery. Status post total abdominal hysterectomy bilateral salpingo-oophorectomy pelvic and aortic lymphadenectomy  Narrative: She returns today for vaginal cylinder fitting. Denies pain or discharge. Reports she is well, has a good appetite, and her only complaint is fatigue.  ALLERGIES: is allergic to codeine.  Meds: Current Outpatient Prescriptions  Medication Sig Dispense Refill  . Albuterol Sulfate (PROAIR RESPICLICK) [id number] (90 Base) MCG/ACT AEPB Inhale 1-2 puffs into the lungs every 4 (four) hours as needed. 1 each 1  . amLODipine (NORVASC) 2.5 MG tablet   0  . aspirin EC 81 MG tablet Take 81 mg by mouth daily.    [name] atenolol (TENORMIN) 100 MG tablet Take 100 mg by mouth daily. Reported on 10/06/2015    . glimepiride (AMARYL) 4 MG tablet Take 4 mg by mouth daily with breakfast.    . hydrALAZINE (APRESOLINE) 25 MG tablet Take 25 mg by mouth 2 (two) times daily.    [name] lisinopril-hydrochlorothiazide (PRINZIDE,ZESTORETIC) 20-25 MG per tablet Take 2 tablets by mouth daily.     . metFORMIN (GLUCOPHAGE) 1000 MG tablet Take 1,000 mg by mouth 2 (two) times daily with a meal.    . Multiple Vitamin (MULTIVITAMIN) tablet Take 1 tablet by mouth daily. Centrum silver    . simvastatin (ZOCOR) 80 MG tablet Take 80 mg by mouth at bedtime.    . sodium chloride (OCEAN) 0.65 % SOLN nasal spray Place 1 spray into  both nostrils as needed for congestion.    [name] lisinopril (PRINIVIL,ZESTRIL) 2.5 MG tablet      No current facility-administered medications for this encounter.     Physical Findings: The patient is in no acute distress. Patient is alert and oriented.  height is 5\' 4"  (1.626 m) and weight is 183 lb 12.8 oz (83.4 kg). Her oral temperature is 98.2 F (36.8 C). Her blood pressure is 126/70 and her pulse is 70. Her respiration is 20.   The heart has a regular rate and rhythm. The lungs are clear to auscultation. Abdomen soft and non-tender.  On pelvic examination, the lesion along the left lateral distal vagina has regressed. There is an area of thickening about 1.5 x 2 cm in size. Minimal palpable disease noted along the anterior lateral distal vaginal wall. No visible or palpable lesion noted along the vaginal cuff.  Patient was best felt to be treated with a Capri applicator and the patient had placement of the [place] applicator without difficulty. Approximately 50 cc of saline was placed within the [place] applicator to ensure a snug fit and minimize movement. The patient tolerated the procedure well.   Impression: Recurrent endometrial adenocarcinoma (FIGO III), poorly differentiated  The patient was successfully fitted for a Capri multichannel brachytherapy treatment. The patient is appropriate to begin vaginal brachytherapy.   Plan: The patient will proceed with CT simulation and vaginal brachytherapy today. _______________________________   [name], PhD, MD  This document serves as a record of services  personally performed by Gery Pray, MD. It was created on his behalf by Darcus Austin, a trained medical scribe. The creation of this record is based on the scribe's personal observations and the provider's statements to them. This document has been checked and approved by the attending provider.

## 2016-04-09 NOTE — Addendum Note (Signed)
Encounter addended by: Jacqulyn Liner, RN on: 04/09/2016  1:46 PM<BR>    Actions taken: Flowsheet accepted

## 2016-04-09 NOTE — Progress Notes (Signed)
  Radiation Oncology         [phone]) [phone] ________________________________  Name: CHANEL HANIGAN MRN: [id number]  Date: 04/09/2016  DOB: 25-Nov-1930  CC: Myriam Jacobson, MD  Marti Sleigh  HDR BRACHYTHERAPY NOTE  DIAGNOSIS: Recurrent endometrial adenocarcinoma (FIGO III), poorly differentiated  Simple treatment device note: Patient had placement of her Capri applicator earlier in the day prior to planning. She will be treated with 4 catheters with multiple dwell positions. This conforms to her anatomy without undue discomfort.  Vaginal brachytherapy procedure node: The patient was brought to the [place] suite. Identity was confirmed. All relevant records and images related to the planned course of therapy were reviewed. The patient freely provided informed written consent to proceed with treatment after reviewing the details related to the planned course of therapy. The consent form was witnessed and verified by the simulation staff. Then, the patient was set-up in a stable reproducible supine position for radiation therapy. The patient's Capri applicator remained in the proximal vagina.  Verification simulation note:  An AP and lateral film was then obtained through the pelvis area. This documented accurate position of the [facility] applicator for treatment.  HDR BRACHYTHERAPY TREATMENT  The remote afterloading device was affixed to the [facility] applicator by catheters. Patient then proceeded to undergo her first high-dose-rate treatment directed at the left vaginal region. The patient was prescribed a dose of 6 Gy to the HRCTV. Patient was treated with 4 channels using multiple dwell positions. Treatment time was 293.7 seconds. Iridium 192 was the high-dose-rate source for treatment. The patient tolerated the treatment well. After completion of her therapy, a radiation survey was performed documenting return of the iridium source into the GammaMed safe.   PLAN: The patient will  return next week for her second HDR treatment. ________________________________  [name], PhD, MD   This document serves as a record of services personally performed by Gery Pray, MD. It was created on his behalf by Darcus Austin, a trained medical scribe. The creation of this record is based on the scribe's personal observations and the provider's statements to them. This document has been checked and approved by the attending provider.

## 2016-04-09 NOTE — Progress Notes (Signed)
  Radiation Oncology         [phone]) [phone] ________________________________  Name: Debbie [name] MRN: [id number]  Date: 04/09/2016  DOB: [birth year]  SIMULATION AND TREATMENT PLANNING NOTE HDR BRACHYTHERAPY  DIAGNOSIS:  Recurrent endometrial adenocarcinoma (FIGO III), poorly differentiated  NARRATIVE:  The patient was brought to the [place] suite.  Identity was confirmed.  All relevant records and images related to the planned course of therapy were reviewed.  The patient freely provided informed written consent to proceed with treatment after reviewing the details related to the planned course of therapy. The consent form was witnessed and verified by the simulation staff.  Then, the patient was set-up in a stable reproducible  supine position for radiation therapy.  CT images were obtained.  Surface markings were placed.  The CT images were loaded into the planning software.  Then the target and avoidance structures were contoured.  Treatment planning then occurred.  The radiation prescription was entered and confirmed.   I have requested : Brachytherapy Isodose Plan and Dosimetry Calculations to plan the radiation distribution.    PLAN:  The patient will receive 3 intracavitary fractions using the multichannel Capri applicator to push the dose to the left lateral vaginal wall where the patient's recurrence was noted. The patient will receive 3 treatments with a dose of 6 Gray each fraction to the [place] Risk CTV. Iridium 192 will be the high rate source. ________________________________  [name], PhD, MD  This document serves as a record of services personally performed by Gery Pray, MD. It was created on his behalf by Darcus Austin, a trained medical scribe. The creation of this record is based on the scribe's personal observations and the provider's statements to them. This document has been checked and approved by the attending provider.

## 2016-04-09 NOTE — Progress Notes (Signed)
Patient back from CT SIM.  SCD boots applied and started.  Call light in reach and patient's family present in the room.  Will continue to monitor.

## 2016-04-09 NOTE — Progress Notes (Signed)
#  16 french foley catheter inserted.  Clear yellow urine noted in the drainage bag.  Patient tolerated well.

## 2016-04-09 NOTE — Progress Notes (Signed)
Follow up  recurrent endometrial adenocarcinoma,  S/p radiation 2009 No c/o pain, discharge, states"Everything is doing good" good appetite,  "Just sleepy" BP 126/70 (BP Location: Left Arm, Patient Position: Sitting, Cuff Size: Normal)   Pulse 70   Temp 98.2 F (36.8 C) (Oral)   Resp 20   Ht 5\' 4"  (1.626 m)   Wt 183 lb 12.8 oz (83.4 kg)   BMI 31.55 kg/m   Wt Readings from Last 3 Encounters:  04/09/16 183 lb 12.8 oz (83.4 kg)  03/18/16 177 lb 12.8 oz (80.6 kg)  03/11/16 183 lb 6.4 oz (83.2 kg)

## 2016-04-14 ENCOUNTER — Telehealth: Payer: Self-pay | Admitting: *Deleted

## 2016-04-14 NOTE — Telephone Encounter (Signed)
CALLED PATIENT TO REMIND OF HDR CAPRI CASE FOR 04-15-16, LVM FOR A RETURN CALL

## 2016-04-15 ENCOUNTER — Ambulatory Visit
Admission: RE | Admit: 2016-04-15 | Discharge: 2016-04-15 | Disposition: A | Discharge status: Home / Self Care | Payer: Medicare HMO | Source: Ambulatory Visit | Attending: Radiation Oncology | Admitting: Radiation Oncology

## 2016-04-15 ENCOUNTER — Telehealth: Payer: Self-pay | Admitting: Oncology

## 2016-04-15 ENCOUNTER — Encounter: Payer: Self-pay | Admitting: Radiation Oncology

## 2016-04-15 VITALS — BP 95/71 | HR 57 | Temp 98.2°F | Ht 64.0 in | Wt 179.2 lb

## 2016-04-15 VITALS — BP 116/84 | HR 68 | Temp 98.4°F

## 2016-04-15 DIAGNOSIS — Z79899 Other long term (current) drug therapy: Secondary | ICD-10-CM | POA: Insufficient documentation

## 2016-04-15 DIAGNOSIS — Z7982 Long term (current) use of aspirin: Secondary | ICD-10-CM | POA: Diagnosis not present

## 2016-04-15 DIAGNOSIS — C541 Malignant neoplasm of endometrium: Secondary | ICD-10-CM | POA: Diagnosis not present

## 2016-04-15 DIAGNOSIS — C7982 Secondary malignant neoplasm of genital organs: Secondary | ICD-10-CM | POA: Diagnosis present

## 2016-04-15 DIAGNOSIS — Z7984 Long term (current) use of oral hypoglycemic drugs: Secondary | ICD-10-CM | POA: Insufficient documentation

## 2016-04-15 DIAGNOSIS — Z885 Allergy status to narcotic agent status: Secondary | ICD-10-CM | POA: Insufficient documentation

## 2016-04-15 DIAGNOSIS — Z510 Encounter for antineoplastic radiation therapy: Secondary | ICD-10-CM | POA: Diagnosis not present

## 2016-04-15 NOTE — Progress Notes (Signed)
  Radiation Oncology         [phone]) [phone] ________________________________  Name: Debbie [name] MRN: [id number]  Date: 04/15/2016  DOB: April 25, 1931  CC: Myriam Jacobson, MD  Marti Sleigh  HDR BRACHYTHERAPY NOTE  DIAGNOSIS: Recurrent endometrial adenocarcinoma (FIGO III), poorly differentiated  Simple treatment device note: Patient had placement of her Capri applicator earlier in the day prior to planning. She will be treated with 3 catheters with multiple dwell positions. This conforms to her anatomy without undue discomfort.  Vaginal brachytherapy procedure node: The patient was brought to the [place] suite. Identity was confirmed. All relevant records and images related to the planned course of therapy were reviewed. The patient freely provided informed written consent to proceed with treatment after reviewing the details related to the planned course of therapy. The consent form was witnessed and verified by the simulation staff. Then, the patient was set-up in a stable reproducible supine position for radiation therapy. The patient's Capri applicator remained in the proximal vagina.  Verification simulation note:  An AP and lateral film was then obtained through the pelvis area. This documented accurate position of the [facility] applicator for treatment.  HDR BRACHYTHERAPY TREATMENT  The remote afterloading device was affixed to the [facility] applicator by catheters. Patient then proceeded to undergo her second high-dose-rate treatment directed at the left vaginal region. The patient was prescribed a dose of 6 Gy to the HRCTV. Patient was treated with 3 channels using multiple dwell positions. Treatment time was 270.2 seconds. Iridium 192 was the high-dose-rate source for treatment. The patient tolerated the treatment well. After completion of her therapy, a radiation survey was performed documenting return of the iridium source into the GammaMed safe. Capri applicator removed  without difficulty after completion of her treatment as well as her Foley catheter and PAS.   PLAN: The patient will return next week for her third HDR treatment. ________________________________  [name], PhD, MD   This document serves as a record of services personally performed by Gery Pray, MD. It was created on his behalf by Darcus Austin, a trained medical scribe. The creation of this record is based on the scribe's personal observations and the provider's statements to them. This document has been checked and approved by the attending provider.

## 2016-04-15 NOTE — Progress Notes (Signed)
  Radiation Oncology         [phone]) [phone] ________________________________  Name: Debbie [name] MRN: [id number]  Date: 04/15/2016  DOB: [birth year]  SIMULATION AND TREATMENT PLANNING NOTE HDR BRACHYTHERAPY  DIAGNOSIS:  Recurrent endometrial adenocarcinoma (FIGO III), poorly differentiated  NARRATIVE:  The patient was brought to the [place] suite.  Identity was confirmed.  All relevant records and images related to the planned course of therapy were reviewed.  The patient freely provided informed written consent to proceed with treatment after reviewing the details related to the planned course of therapy. The consent form was witnessed and verified by the simulation staff.  Then, the patient was set-up in a stable reproducible  supine position for radiation therapy.  CT images were obtained.  Surface markings were placed.  The CT images were loaded into the planning software.  Then the target and avoidance structures were contoured.  Treatment planning then occurred.  The radiation prescription was entered and confirmed.   I have requested : Brachytherapy Isodose Plan and Dosimetry Calculations to plan the radiation distribution.    PLAN:  The patient will receive 3 intracavitary fractions using the multichannel Capri applicator to push the dose to the left lateral vaginal wall where the patient's recurrence was noted. The patient will receive 3 treatments with a dose of 6 Gray each fraction to the High Risk CTV. Iridium 192 will be the high rate source. She will receive her second treatment today. ________________________________  [name], PhD, MD  This document serves as a record of services personally performed by Gery Pray, MD. It was created on his behalf by Darcus Austin, a trained medical scribe. The creation of this record is based on the scribe's personal observations and the provider's statements to them. This document has been checked and approved by the  attending provider.

## 2016-04-15 NOTE — Progress Notes (Signed)
16 fr foley catheter was inserted by Joaquim Lai, RN.  Clear yellow urine noted in collection bag.

## 2016-04-15 NOTE — Progress Notes (Signed)
Radiation Oncology         [phone] ________________________________  Name: Debbie [name] MRN: [id number]  Date: 04/15/2016  DOB: Oct 07, 1930  Vaginal Brachytherapy Procedure Note  CC: Myriam Jacobson, MD Marti Sleigh    ICD-9-CM ICD-10-CM   1. Secondary malignant neoplasm of vagina [facility]) 198.82 C79.82     Diagnosis: Recurrent endometrial adenocarcinoma (FIGO III), poorly differentiated  Radiation Treatment Dates:  04/09/16: Vaginal HDR Brachytherapy. 6 Gy in 1 fraction.  02/12/16 - 03/19/16: 50 Gy in 25 fractions to the pelvis.  9/21,9/28,10/05,03/20/2008: 4 fractions of 24 Gy of vaginal brachytherapy alone after her surgery. Status post total abdominal hysterectomy bilateral salpingo-oophorectomy pelvic and aortic lymphadenectomy  Narrative: She returns today for [facility] multichannel brachytherapy applicator fitting. The patient tolerated her first HDR treatment well without any side effects.  ALLERGIES: is allergic to codeine.  Meds: Current Outpatient Prescriptions  Medication Sig Dispense Refill  . Albuterol Sulfate (PROAIR RESPICLICK) 123XX123 (90 Base) MCG/ACT AEPB Inhale 1-2 puffs into the lungs every 4 (four) hours as needed. 1 each 1  . amLODipine (NORVASC) 2.5 MG tablet   0  . aspirin EC 81 MG tablet Take 81 mg by mouth daily.    [name] atenolol (TENORMIN) 100 MG tablet Take 100 mg by mouth daily. Reported on 10/06/2015    . glimepiride (AMARYL) 4 MG tablet Take 4 mg by mouth daily with breakfast.    . hydrALAZINE (APRESOLINE) 25 MG tablet Take 25 mg by mouth 2 (two) times daily.    [name] lisinopril (PRINIVIL,ZESTRIL) 2.5 MG tablet     . lisinopril-hydrochlorothiazide (PRINZIDE,ZESTORETIC) 20-25 MG per tablet Take 2 tablets by mouth daily.     . metFORMIN (GLUCOPHAGE) 1000 MG tablet Take 1,000 mg by mouth 2 (two) times daily with a meal.    . Multiple Vitamin (MULTIVITAMIN) tablet Take 1 tablet by mouth daily. Centrum silver    . simvastatin (ZOCOR) 80 MG  tablet Take 80 mg by mouth at bedtime.    . sodium chloride (OCEAN) 0.65 % SOLN nasal spray Place 1 spray into both nostrils as needed for congestion.     No current facility-administered medications for this encounter.     Physical Findings: The patient is in no acute distress. Patient is alert and oriented.  height is 5\' 4"  (1.626 m) and weight is 179 lb 3.2 oz (81.3 kg). Her oral temperature is 98.2 F (36.8 C). Her blood pressure is 95/71 and her pulse is 57 (abnormal). Her oxygen saturation is 92%.   The heart has a regular rate and rhythm. The lungs are clear to auscultation. Abdomen soft and non-tender.  Unable to definitely palpate a mass on pelvic exam today.  Patient was best felt to be treated with a Capri applicator and the patient had placement of the [place] applicator without difficulty. Approximately 50 cc of saline was placed within the [place] applicator to ensure a snug fit and minimize movement. The patient tolerated the procedure well.  Impression: Recurrent endometrial adenocarcinoma (FIGO III), poorly differentiated  The patient was successfully fitted for a Capri multichannel brachytherapy applicator. The patient is appropriate to receive 2nd vaginal brachytherapy treatment.  Plan: The patient will proceed with CT simulation and vaginal brachytherapy today. _______________________________   [name], PhD, MD  This document serves as a record of services personally performed by Gery Pray, MD. It was created on his behalf by Darcus Austin, a trained medical scribe. The creation of this record is based on the scribe's  personal observations and the provider's statements to them. This document has been checked and approved by the attending provider.

## 2016-04-15 NOTE — Progress Notes (Signed)
Foley catheter removed intact.  Assisted patient with dressing and transported her to the waiting room via wheelchair.

## 2016-04-15 NOTE — Progress Notes (Signed)
Please see the Nurse Progress Note in the MD Initial Consult Encounter for this patient. 

## 2016-04-21 ENCOUNTER — Telehealth: Payer: Self-pay | Admitting: *Deleted

## 2016-04-21 NOTE — Telephone Encounter (Signed)
CALLED PATIENT TO REMIND OF HDR CAPRI CASE FOR 04-22-16, LVM FOR A RETURN CALL

## 2016-04-22 ENCOUNTER — Ambulatory Visit
Admission: RE | Admit: 2016-04-22 | Discharge: 2016-04-22 | Disposition: A | Discharge status: Home / Self Care | Payer: Medicare HMO | Source: Ambulatory Visit | Attending: Radiation Oncology | Admitting: Radiation Oncology

## 2016-04-22 ENCOUNTER — Ambulatory Visit: Payer: Medicare HMO

## 2016-04-22 DIAGNOSIS — C7982 Secondary malignant neoplasm of genital organs: Secondary | ICD-10-CM

## 2016-04-22 DIAGNOSIS — Z90710 Acquired absence of both cervix and uterus: Secondary | ICD-10-CM | POA: Insufficient documentation

## 2016-04-22 DIAGNOSIS — C541 Malignant neoplasm of endometrium: Secondary | ICD-10-CM | POA: Insufficient documentation

## 2016-04-22 DIAGNOSIS — Z7982 Long term (current) use of aspirin: Secondary | ICD-10-CM | POA: Insufficient documentation

## 2016-04-22 DIAGNOSIS — Z9889 Other specified postprocedural states: Secondary | ICD-10-CM | POA: Diagnosis not present

## 2016-04-22 DIAGNOSIS — Z885 Allergy status to narcotic agent status: Secondary | ICD-10-CM | POA: Insufficient documentation

## 2016-04-22 DIAGNOSIS — Z923 Personal history of irradiation: Secondary | ICD-10-CM | POA: Diagnosis not present

## 2016-04-22 DIAGNOSIS — Z90722 Acquired absence of ovaries, bilateral: Secondary | ICD-10-CM | POA: Insufficient documentation

## 2016-04-22 DIAGNOSIS — Z7984 Long term (current) use of oral hypoglycemic drugs: Secondary | ICD-10-CM | POA: Diagnosis not present

## 2016-04-22 DIAGNOSIS — Z510 Encounter for antineoplastic radiation therapy: Secondary | ICD-10-CM | POA: Diagnosis present

## 2016-04-22 NOTE — Progress Notes (Signed)
  Radiation Oncology         [phone]) [phone] ________________________________  Name: TESLA RUEF    MRN: [id number]         Date: 04/22/2016                      DOB: 02-21-1931  CC: Myriam Jacobson, MD  Marti Sleigh  HDR BRACHYTHERAPY NOTE  DIAGNOSIS: Recurrent endometrial adenocarcinoma (FIGO III), poorly differentiated  Simple treatment device note: Patient had placement of her Capri applicator earlier in the day prior to planning. She will be treated with 3 catheters with multiple dwell positions. This conforms to her anatomy without undue discomfort.  Vaginal brachytherapy procedure node: The patient was brought to the [place] suite. Identity was confirmed. All relevant records and images related to the planned course of therapy were reviewed. The patient freely provided informed written consent to proceed with treatment after reviewing the details related to the planned course of therapy. The consent form was witnessed and verified by the simulation staff. Then, the patient was set-up in a stable reproducible supine position for radiation therapy. The patient's Capri applicator remained in the proximal vagina.  Verification simulation note:  An AP and lateral film was then obtained through the pelvis area. This documented accurate position of the [facility] applicator for treatment.  HDR BRACHYTHERAPY TREATMENT  The remote afterloading device was affixed to the [facility] applicator by catheters. Patient then proceeded to undergo her third high-dose-rate treatment directed at the left vaginal region. The patient was prescribed a dose of 6 Gy to the HRCTV. Patient was treated with 3 channels using multiple dwell positions. Treatment time was 280.1 seconds. Iridium 192 was the high-dose-rate source for treatment. The patient tolerated the treatment well. After completion of her therapy, a radiation survey was performed documenting return of the iridium source into the  GammaMed safe. Capri applicator removed without difficulty after completion of her treatment as well as her Foley catheter and PAS.   PLAN: The patient will return for follow-up in one month. This HDR treatment completes her therapy ________________________________  [name], PhD, MD

## 2016-04-22 NOTE — Progress Notes (Signed)
Inserted #16 Fr foley catheter.  Pale yellow urine noted.  Patient tolerated well.  Will continue to monitor.

## 2016-04-22 NOTE — Progress Notes (Signed)
Radiation Oncology         [phone] ________________________________  Name: Debbie [name]    MRN: [id number]         Date: 11/142017                      DOB: 1931/03/03  Vaginal Brachytherapy Procedure Note  CC: Myriam Jacobson, MD Marti Sleigh    ICD-9-CM ICD-10-CM   1. Secondary malignant neoplasm of vagina [facility]) 198.82 C79.82     Diagnosis: Recurrent endometrial adenocarcinoma (FIGO III), poorly differentiated  Radiation Treatment Dates:  04/09/16: Vaginal HDR Brachytherapy. 6 Gy in 1 fraction. 04/15/16 6 Gy in 1 fx.  02/12/16 - 03/19/16: 50 Gy in 25 fractions to the pelvis.  9/21,9/28,10/05,03/20/2008: 4 fractions of 24 Gy of vaginal brachytherapy alone after her surgery. Status post total abdominal hysterectomy bilateral salpingo-oophorectomy pelvic and aortic lymphadenectomy  Narrative: She returns today for [facility] multichannel brachytherapy applicator fitting. The patient tolerated her first 2 HDR treatment well without any side effects.  ALLERGIES: is allergic to codeine.  Meds:       Current Outpatient Prescriptions  Medication Sig Dispense Refill  . Albuterol Sulfate (PROAIR RESPICLICK) 123XX123 (90 Base) MCG/ACT AEPB Inhale 1-2 puffs into the lungs every 4 (four) hours as needed. 1 each 1  . amLODipine (NORVASC) 2.5 MG tablet   0  . aspirin EC 81 MG tablet Take 81 mg by mouth daily.    [name] atenolol (TENORMIN) 100 MG tablet Take 100 mg by mouth daily. Reported on 10/06/2015    . glimepiride (AMARYL) 4 MG tablet Take 4 mg by mouth daily with breakfast.    . hydrALAZINE (APRESOLINE) 25 MG tablet Take 25 mg by mouth 2 (two) times daily.    [name] lisinopril (PRINIVIL,ZESTRIL) 2.5 MG tablet     . lisinopril-hydrochlorothiazide (PRINZIDE,ZESTORETIC) 20-25 MG per tablet Take 2 tablets by mouth daily.     . metFORMIN (GLUCOPHAGE) 1000 MG tablet Take 1,000 mg by mouth 2 (two) times daily with a meal.    . Multiple Vitamin  (MULTIVITAMIN) tablet Take 1 tablet by mouth daily. Centrum silver    . simvastatin (ZOCOR) 80 MG tablet Take 80 mg by mouth at bedtime.    . sodium chloride (OCEAN) 0.65 % SOLN nasal spray Place 1 spray into both nostrils as needed for congestion.     No current facility-administered medications for this encounter.     Physical Findings: The patient is in no acute distress. Patient is alert and oriented.  height is 5\' 4"  (1.626 m) and weight is 179 lb 3.2 oz (81.3 kg). Her oral temperature is 98.2 F (36.8 C). Her blood pressure is 95/71 and her pulse is 57 (abnormal). Her oxygen saturation is 92%.   The heart has a regular rate and rhythm. The lungs are clear to auscultation. Abdomen soft and non-tender.  Unable to definitely palpate a mass on pelvic exam today. Barely visible with speculum  Patient was best felt to be treated with a Capri applicator and the patient had placement of the [place] applicator without difficulty. Approximately 50 cc of saline was placed within the [place] applicator to ensure a snug fit and minimize movement. The patient tolerated the procedure well.  Impression: Recurrent endometrial adenocarcinoma (FIGO III), poorly differentiated  The patient was successfully fitted for a Capri multichannel brachytherapy applicator. The patient is appropriate to receive 3rd vaginal brachytherapy treatment.  Plan: The patient will proceed with CT simulation and vaginal brachytherapy  today. _______________________________   [name], PhD, MD

## 2016-04-22 NOTE — Progress Notes (Signed)
  Radiation Oncology         [phone]) [phone] ________________________________  Name: Debbie [name] MRN: [id number]  Date: 04/22/2016  DOB: 1930/10/29  SIMULATION AND TREATMENT PLANNING NOTE HDR BRACHYTHERAPY  DIAGNOSIS:  Recurrent endometrial adenocarcinoma (FIGO III), poorly differentiated  NARRATIVE:  The patient was brought to the [place] suite.  Identity was confirmed.  All relevant records and images related to the planned course of therapy were reviewed.  The patient freely provided informed written consent to proceed with treatment after reviewing the details related to the planned course of therapy. The consent form was witnessed and verified by the simulation staff.  Then, the patient was set-up in a stable reproducible  supine position for radiation therapy.  CT images were obtained.  Surface markings were placed.  The CT images were loaded into the planning software.  Then the target and avoidance structures were contoured.  Treatment planning then occurred.  The radiation prescription was entered and confirmed.   I have requested : Brachytherapy Isodose Plan and Dosimetry Calculations to plan the radiation distribution.    PLAN:  The patient will receive 6 Gy in 1 fraction directed at the [facility].  Iridium 192 will be the high-dose-rate source. The patient will be treated with the multichannel capri applicator. 3 Catheters will be used to deliver the treatment.    ________________________________  [name], PhD, MD

## 2016-05-09 DIAGNOSIS — N390 Urinary tract infection, site not specified: Secondary | ICD-10-CM

## 2016-05-09 HISTORY — DX: Urinary tract infection, site not specified: N39.0

## 2016-05-21 NOTE — Telephone Encounter (Signed)
error 

## 2016-05-22 ENCOUNTER — Encounter: Payer: Self-pay | Admitting: Radiation Oncology

## 2016-05-22 NOTE — Progress Notes (Addendum)
  Radiation Oncology         [phone] ________________________________  Name: Debbie [name] MRN: [id number]  Date: 05/22/2016  DOB: 12/11/1930  End of Treatment Note  Diagnosis: Recurrent endometrial adenocarcinoma (FIGO III), poorly differentiated     Indication for treatment:  Curative       Radiation treatment dates:   02/12/16-03/19/16     04/09/16, 04/15/16, 04/22/16  Site/dose:   1) Pelvis/ 50 Gy in 25 fractions   2) HDR vagina / 18 Gy in 3 fractions  Beams/energy:   1) IMRT / 6X    2) HDR Vaginal / Iridium HDR  Narrative: The patient tolerated radiation treatment relatively well. The patient presented with fatigue during treatment. The patient was treated with the [place] multilumen applicator  to push dose towards the area of recurrence along the vaginal vault.   Plan: The patient has completed radiation treatment. The patient will return to radiation oncology clinic for routine followup in one month. I advised them to call or return sooner if they have any questions or concerns related to their recovery or treatment.  -----------------------------------  [name], PhD, MD  This document serves as a record of services personally performed by Gery Pray, MD. It was created on his behalf by Bethann Humble, a trained medical scribe. The creation of this record is based on the scribe's personal observations and the provider's statements to them. This document has been checked and approved by the attending provider.

## 2016-05-28 ENCOUNTER — Inpatient Hospital Stay (HOSPITAL_COMMUNITY)
Admission: EM | Admit: 2016-05-28 | Discharge: 2016-06-02 | DRG: 637 | Disposition: A | Discharge status: Home Health | Payer: Medicare HMO | Attending: Family Medicine | Admitting: Family Medicine

## 2016-05-28 ENCOUNTER — Encounter (HOSPITAL_COMMUNITY): Payer: Self-pay | Admitting: Emergency Medicine

## 2016-05-28 DIAGNOSIS — E118 Type 2 diabetes mellitus with unspecified complications: Secondary | ICD-10-CM | POA: Diagnosis not present

## 2016-05-28 DIAGNOSIS — T502X5A Adverse effect of carbonic-anhydrase inhibitors, benzothiadiazides and other diuretics, initial encounter: Secondary | ICD-10-CM | POA: Diagnosis present

## 2016-05-28 DIAGNOSIS — N289 Disorder of kidney and ureter, unspecified: Secondary | ICD-10-CM

## 2016-05-28 DIAGNOSIS — R4781 Slurred speech: Secondary | ICD-10-CM | POA: Diagnosis present

## 2016-05-28 DIAGNOSIS — Z923 Personal history of irradiation: Secondary | ICD-10-CM

## 2016-05-28 DIAGNOSIS — J9601 Acute respiratory failure with hypoxia: Secondary | ICD-10-CM | POA: Diagnosis present

## 2016-05-28 DIAGNOSIS — M6281 Muscle weakness (generalized): Secondary | ICD-10-CM

## 2016-05-28 DIAGNOSIS — F419 Anxiety disorder, unspecified: Secondary | ICD-10-CM | POA: Diagnosis not present

## 2016-05-28 DIAGNOSIS — C541 Malignant neoplasm of endometrium: Secondary | ICD-10-CM | POA: Diagnosis present

## 2016-05-28 DIAGNOSIS — N390 Urinary tract infection, site not specified: Secondary | ICD-10-CM | POA: Diagnosis present

## 2016-05-28 DIAGNOSIS — E162 Hypoglycemia, unspecified: Secondary | ICD-10-CM | POA: Diagnosis present

## 2016-05-28 DIAGNOSIS — Z823 Family history of stroke: Secondary | ICD-10-CM

## 2016-05-28 DIAGNOSIS — C52 Malignant neoplasm of vagina: Secondary | ICD-10-CM | POA: Diagnosis present

## 2016-05-28 DIAGNOSIS — E11649 Type 2 diabetes mellitus with hypoglycemia without coma: Principal | ICD-10-CM | POA: Diagnosis present

## 2016-05-28 DIAGNOSIS — R0989 Other specified symptoms and signs involving the circulatory and respiratory systems: Secondary | ICD-10-CM

## 2016-05-28 DIAGNOSIS — I130 Hypertensive heart and chronic kidney disease with heart failure and stage 1 through stage 4 chronic kidney disease, or unspecified chronic kidney disease: Secondary | ICD-10-CM | POA: Diagnosis present

## 2016-05-28 DIAGNOSIS — C7982 Secondary malignant neoplasm of genital organs: Secondary | ICD-10-CM | POA: Diagnosis present

## 2016-05-28 DIAGNOSIS — I5030 Unspecified diastolic (congestive) heart failure: Secondary | ICD-10-CM | POA: Diagnosis present

## 2016-05-28 DIAGNOSIS — Z8249 Family history of ischemic heart disease and other diseases of the circulatory system: Secondary | ICD-10-CM

## 2016-05-28 DIAGNOSIS — E1151 Type 2 diabetes mellitus with diabetic peripheral angiopathy without gangrene: Secondary | ICD-10-CM | POA: Diagnosis present

## 2016-05-28 DIAGNOSIS — Z87891 Personal history of nicotine dependence: Secondary | ICD-10-CM

## 2016-05-28 DIAGNOSIS — I10 Essential (primary) hypertension: Secondary | ICD-10-CM | POA: Diagnosis not present

## 2016-05-28 DIAGNOSIS — C549 Malignant neoplasm of corpus uteri, unspecified: Secondary | ICD-10-CM | POA: Diagnosis present

## 2016-05-28 DIAGNOSIS — Z7984 Long term (current) use of oral hypoglycemic drugs: Secondary | ICD-10-CM

## 2016-05-28 DIAGNOSIS — Z7982 Long term (current) use of aspirin: Secondary | ICD-10-CM

## 2016-05-28 DIAGNOSIS — K520 Gastroenteritis and colitis due to radiation: Secondary | ICD-10-CM | POA: Diagnosis present

## 2016-05-28 DIAGNOSIS — Z8049 Family history of malignant neoplasm of other genital organs: Secondary | ICD-10-CM

## 2016-05-28 DIAGNOSIS — Z801 Family history of malignant neoplasm of trachea, bronchus and lung: Secondary | ICD-10-CM

## 2016-05-28 DIAGNOSIS — E876 Hypokalemia: Secondary | ICD-10-CM | POA: Diagnosis present

## 2016-05-28 DIAGNOSIS — E119 Type 2 diabetes mellitus without complications: Secondary | ICD-10-CM

## 2016-05-28 DIAGNOSIS — E785 Hyperlipidemia, unspecified: Secondary | ICD-10-CM | POA: Diagnosis present

## 2016-05-28 DIAGNOSIS — R062 Wheezing: Secondary | ICD-10-CM

## 2016-05-28 DIAGNOSIS — R0603 Acute respiratory distress: Secondary | ICD-10-CM | POA: Diagnosis present

## 2016-05-28 DIAGNOSIS — L8990 Pressure ulcer of unspecified site, unspecified stage: Secondary | ICD-10-CM | POA: Diagnosis present

## 2016-05-28 DIAGNOSIS — R0602 Shortness of breath: Secondary | ICD-10-CM

## 2016-05-28 DIAGNOSIS — R0902 Hypoxemia: Secondary | ICD-10-CM

## 2016-05-28 DIAGNOSIS — N183 Chronic kidney disease, stage 3 (moderate): Secondary | ICD-10-CM | POA: Diagnosis present

## 2016-05-28 DIAGNOSIS — J449 Chronic obstructive pulmonary disease, unspecified: Secondary | ICD-10-CM

## 2016-05-28 DIAGNOSIS — I6529 Occlusion and stenosis of unspecified carotid artery: Secondary | ICD-10-CM | POA: Diagnosis present

## 2016-05-28 HISTORY — DX: Urinary tract infection, site not specified: N39.0

## 2016-05-28 HISTORY — DX: Secondary malignant neoplasm of genital organs: C79.82

## 2016-05-28 LAB — CBG MONITORING, ED
GLUCOSE-CAPILLARY: 50 mg/dL — AB (ref 65–99)
GLUCOSE-CAPILLARY: 70 mg/dL (ref 65–99)
GLUCOSE-CAPILLARY: 238 mg/dL — AB (ref 65–99)
GLUCOSE-CAPILLARY: 128 mg/dL — AB (ref 65–99)
Glucose-Capillary: 116 mg/dL — ABNORMAL HIGH (ref 65–99)
Glucose-Capillary: 71 mg/dL (ref 65–99)
Glucose-Capillary: 31 mg/dL — CL (ref 65–99)
Glucose-Capillary: 89 mg/dL (ref 65–99)

## 2016-05-28 LAB — URINALYSIS, ROUTINE W REFLEX MICROSCOPIC
Bilirubin Urine: NEGATIVE
Glucose, UA: 50 mg/dL — AB
Hgb urine dipstick: NEGATIVE
KETONES UR: NEGATIVE mg/dL
Nitrite: POSITIVE — AB
PH: 6 (ref 5.0–8.0)
Protein, ur: 100 mg/dL — AB
SPECIFIC GRAVITY, URINE: 1.008 (ref 1.005–1.030)

## 2016-05-28 LAB — GLUCOSE, CAPILLARY
GLUCOSE-CAPILLARY: 71 mg/dL (ref 65–99)
Glucose-Capillary: 58 mg/dL — ABNORMAL LOW (ref 65–99)
Glucose-Capillary: 68 mg/dL (ref 65–99)

## 2016-05-28 LAB — MAGNESIUM: MAGNESIUM: 1.7 mg/dL (ref 1.7–2.4)

## 2016-05-28 LAB — CBC
HCT: 34.7 % — ABNORMAL LOW (ref 36.0–46.0)
Hemoglobin: 11.3 g/dL — ABNORMAL LOW (ref 12.0–15.0)
MCH: 30 pg (ref 26.0–34.0)
MCHC: 32.6 g/dL (ref 30.0–36.0)
MCV: 92 fL (ref 78.0–100.0)
PLATELETS: 256 10*3/uL (ref 150–400)
RBC: 3.77 MIL/uL — ABNORMAL LOW (ref 3.87–5.11)
RDW: 16.9 % — ABNORMAL HIGH (ref 11.5–15.5)
WBC: 5.7 10*3/uL (ref 4.0–10.5)

## 2016-05-28 LAB — BASIC METABOLIC PANEL
Anion gap: 10 (ref 5–15)
BUN: 25 mg/dL — AB (ref 6–20)
CALCIUM: 8.6 mg/dL — AB (ref 8.9–10.3)
CO2: 26 mmol/L (ref 22–32)
CREATININE: 2.02 mg/dL — AB (ref 0.44–1.00)
Chloride: 107 mmol/L (ref 101–111)
GFR calc Af Amer: 25 mL/min — ABNORMAL LOW (ref >60)
GFR, EST NON AFRICAN AMERICAN: 22 mL/min — AB (ref >60)
Glucose, Bld: 188 mg/dL — ABNORMAL HIGH (ref 65–99)
Potassium: 3.1 mmol/L — ABNORMAL LOW (ref 3.5–5.1)
SODIUM: 143 mmol/L (ref 135–145)

## 2016-05-28 MED ORDER — INSULIN ASPART 100 UNIT/ML ~~LOC~~ SOLN
0.0000 [IU] | Freq: Three times a day (TID) | SUBCUTANEOUS | Status: DC
  Administered 2016-05-29: 7 [IU] via SUBCUTANEOUS
  Administered 2016-05-29: 3 [IU] via SUBCUTANEOUS
  Administered 2016-05-30 – 2016-06-01 (×4): 1 [IU] via SUBCUTANEOUS

## 2016-05-28 MED ORDER — ASPIRIN EC 81 MG PO TBEC
81.0000 mg | DELAYED_RELEASE_TABLET | Freq: Every day | ORAL | Status: DC
  Administered 2016-05-29 – 2016-06-02 (×5): 81 mg via ORAL
  Filled 2016-05-28 (×5): qty 1

## 2016-05-28 MED ORDER — DEXTROSE 50 % IV SOLN
1.0000 | Freq: Once | INTRAVENOUS | Status: AC
  Administered 2016-05-28: 50 mL via INTRAVENOUS

## 2016-05-28 MED ORDER — HYDRALAZINE HCL 25 MG PO TABS
25.0000 mg | ORAL_TABLET | Freq: Three times a day (TID) | ORAL | Status: DC
  Administered 2016-05-28 – 2016-06-02 (×14): 25 mg via ORAL
  Filled 2016-05-28 (×14): qty 1

## 2016-05-28 MED ORDER — LISINOPRIL 5 MG PO TABS
2.5000 mg | ORAL_TABLET | Freq: Every day | ORAL | Status: DC
  Administered 2016-05-29 – 2016-06-02 (×5): 2.5 mg via ORAL
  Filled 2016-05-28 (×5): qty 1

## 2016-05-28 MED ORDER — DEXTROSE 5 % IV SOLN
1.0000 g | Freq: Once | INTRAVENOUS | Status: AC
  Administered 2016-05-28: 1 g via INTRAVENOUS
  Filled 2016-05-28: qty 10

## 2016-05-28 MED ORDER — DEXTROSE 50 % IV SOLN
INTRAVENOUS | Status: AC
  Filled 2016-05-28: qty 100

## 2016-05-28 MED ORDER — ONDANSETRON HCL 4 MG PO TABS
4.0000 mg | ORAL_TABLET | Freq: Four times a day (QID) | ORAL | Status: DC | PRN
Start: 2016-05-28 — End: 2016-06-02

## 2016-05-28 MED ORDER — DEXTROSE-NACL 5-0.45 % IV SOLN
INTRAVENOUS | Status: DC
  Administered 2016-05-28: 13:00:00 via INTRAVENOUS

## 2016-05-28 MED ORDER — DEXTROSE-NACL 5-0.45 % IV SOLN: INTRAVENOUS | Status: DC

## 2016-05-28 MED ORDER — TRAZODONE HCL 50 MG PO TABS: 25.0000 mg | ORAL_TABLET | Freq: Every evening | ORAL | Status: DC | PRN

## 2016-05-28 MED ORDER — ACETAMINOPHEN 650 MG RE SUPP: 650.0000 mg | Freq: Four times a day (QID) | RECTAL | Status: DC | PRN

## 2016-05-28 MED ORDER — DEXTROSE 5 % IV SOLN
1.0000 g | INTRAVENOUS | Status: DC
  Administered 2016-05-29 – 2016-06-02 (×5): 1 g via INTRAVENOUS
  Filled 2016-05-28 (×5): qty 10

## 2016-05-28 MED ORDER — HEPARIN SODIUM (PORCINE) 5000 UNIT/ML IJ SOLN
5000.0000 [IU] | Freq: Three times a day (TID) | INTRAMUSCULAR | Status: DC
  Administered 2016-05-28 – 2016-06-02 (×14): 5000 [IU] via SUBCUTANEOUS
  Filled 2016-05-28 (×14): qty 1

## 2016-05-28 MED ORDER — ACETAMINOPHEN 325 MG PO TABS: 650.0000 mg | ORAL_TABLET | Freq: Four times a day (QID) | ORAL | Status: DC | PRN

## 2016-05-28 MED ORDER — ONDANSETRON HCL 4 MG/2ML IJ SOLN: 4.0000 mg | Freq: Four times a day (QID) | INTRAMUSCULAR | Status: DC | PRN

## 2016-05-28 MED ORDER — DEXTROSE 50 % IV SOLN
INTRAVENOUS | Status: AC
  Filled 2016-05-28: qty 50

## 2016-05-28 MED ORDER — MAGNESIUM CITRATE PO SOLN: 1.0000 | Freq: Once | ORAL | Status: DC | PRN

## 2016-05-28 MED ORDER — BISACODYL 10 MG RE SUPP: 10.0000 mg | Freq: Every day | RECTAL | Status: DC | PRN

## 2016-05-28 MED ORDER — AMLODIPINE BESYLATE 2.5 MG PO TABS
2.5000 mg | ORAL_TABLET | Freq: Every day | ORAL | Status: DC
  Administered 2016-05-29 – 2016-05-31 (×3): 2.5 mg via ORAL
  Filled 2016-05-28 (×3): qty 1

## 2016-05-28 MED ORDER — ATORVASTATIN CALCIUM 10 MG PO TABS: 20.0000 mg | ORAL_TABLET | Freq: Every day | ORAL | Status: DC

## 2016-05-28 MED ORDER — SODIUM CHLORIDE 0.9 % IV SOLN: INTRAVENOUS | Status: DC

## 2016-05-28 MED ORDER — DEXTROSE 10 % IV SOLN
INTRAVENOUS | Status: DC
  Administered 2016-05-28: via INTRAVENOUS

## 2016-05-28 MED ORDER — HEPARIN SODIUM (PORCINE) 5000 UNIT/ML IJ SOLN: 5000.0000 [IU] | Freq: Three times a day (TID) | INTRAMUSCULAR | Status: DC

## 2016-05-28 MED ORDER — SODIUM CHLORIDE 0.9 % IV SOLN
30.0000 meq | Freq: Once | INTRAVENOUS | Status: AC
  Administered 2016-05-28: 30 meq via INTRAVENOUS
  Filled 2016-05-28: qty 15

## 2016-05-28 MED ORDER — ATORVASTATIN CALCIUM 40 MG PO TABS
40.0000 mg | ORAL_TABLET | Freq: Every day | ORAL | Status: DC
  Administered 2016-05-28 – 2016-06-01 (×5): 40 mg via ORAL
  Filled 2016-05-28 (×5): qty 1

## 2016-05-28 MED ORDER — SENNOSIDES-DOCUSATE SODIUM 8.6-50 MG PO TABS
1.0000 | ORAL_TABLET | Freq: Every evening | ORAL | Status: DC | PRN
  Filled 2016-05-28: qty 1

## 2016-05-28 MED ORDER — ATENOLOL 50 MG PO TABS
50.0000 mg | ORAL_TABLET | Freq: Two times a day (BID) | ORAL | Status: DC
  Administered 2016-05-28 – 2016-06-02 (×10): 50 mg via ORAL
  Filled 2016-05-28 (×10): qty 1

## 2016-05-28 NOTE — ED Notes (Signed)
Pt's CBG 71.  Informed Estill Batten, RN.

## 2016-05-28 NOTE — ED Notes (Signed)
Pt's CBG 31.  Informed Estill Batten, RN.  Asked [place] Control and instrumentation engineer) to page the Dr, per Estill Batten, [place].

## 2016-05-28 NOTE — ED Provider Notes (Signed)
[facility] DEPT Provider Note   CSN: [id number] Arrival date & time: 05/28/16  0757     History   Chief Complaint Chief Complaint  Patient presents with  . Hypoglycemia    HPI Debbie [name] is a 82 y.o. female.  HPI  Pt with hx of diabetes presents with c/o hypoglycemia.  She was found by son with slurred speech and decreased mental status- blood glucose was in 50s.  Pt given 1/2 amp of d50 by EMS and glucose up to 90s on arrival to the ED.  Several minutes later glucose down to 50s again.  Pt currently denies any symptoms.  She states she doesn't eat much but ate her usual amount yesterday.  Did not miss any meals.  Family states she has been on metformin and glimeperide for the past 5 years without any recent change in dose.  She denies any acute illness.  Has had some diarrhea for the past month after finishing radiation therapy.  Denies urinary symptoms.  No fever/cough.  There are no other associated systemic symptoms, there are no other alleviating or modifying factors.  Past Medical History:  Diagnosis Date  . Allergy   . Anxiety   . Arthritis   . Cancer ([place]) dx'd 12/2007   endometroid adenocarcinoma  . Cataract    both  . Diabetes mellitus    fasting [id number]  . Dizziness   . History of radiation therapy 9/21,9/28,10/05,05/20/2008   4 txs 2400 cGy endometrial adenocarcinoma  . Hypertension   . Peripheral vascular disease ([place])   . Seasonal allergies     Patient Active Problem List   Diagnosis Date Noted  . UTI (urinary tract infection) 05/28/2016  . Hypoglycemia 05/28/2016  . Diabetes ([place]) 05/28/2016  . Anxiety 05/28/2016  . Hypertension 05/28/2016  . 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 Carotid stenosis, asymptomatic 11/28/2014  . Aftercare following surgery of the circulatory system 04/11/2014  . Aftercare following surgery of the circulatory system, [place] 01/31/2014  . Carotid stenosis 01/10/2014  . Occlusion and stenosis  of carotid artery without mention of cerebral infarction 01/10/2014  . Malignant neoplasm of corpus uteri, except isthmus ([place]) 07/07/2011    Past Surgical History:  Procedure Laterality Date  . ABDOMINAL HYSTERECTOMY    . APPENDECTOMY    . ENDARTERECTOMY Left 01/12/2014   Procedure: LEFT CAROTID ARTERY ENDARTERECTOMY WITH HEMASHIELD PATCH ANGIOPLASTY;  Surgeon: Mal Misty, MD;  Location: [facility];  Service: Vascular;  Laterality: Left;  . ENDARTERECTOMY Right 03/17/2014   Procedure: RIGHT CAROTD ARTERY ENDARTERECTOMY WITH DACRON PATCH ANGIOPLASTY;  Surgeon: Mal Misty, MD;  Location: [facility];  Service: Vascular;  Laterality: Right;  . EYE SURGERY Bilateral    cataracts  . NASAL SEPTUM SURGERY    . NECK SURGERY Bilateral    arterial  . TONSILLECTOMY      OB History    No data available       Home Medications    Prior to Admission medications   Medication Sig Start Date End Date Taking? Authorizing Provider  amLODipine (NORVASC) 2.5 MG tablet Take 2.5 mg by mouth daily.  10/20/14  Yes Historical Provider, MD  aspirin EC 81 MG tablet Take 81 mg by mouth daily.   Yes Historical Provider, MD  atenolol (TENORMIN) 50 MG tablet Take 50 mg by mouth daily.   Yes Historical Provider, MD  atorvastatin (LIPITOR) 20 MG tablet Take 20 mg by mouth daily. 05/26/16  Yes Historical Provider, MD  glimepiride (AMARYL) 4 MG tablet Take 4 mg by mouth daily with breakfast.   Yes Historical Provider, MD  hydrALAZINE (APRESOLINE) 25 MG tablet Take 25 mg by mouth 3 (three) times daily.    Yes Historical Provider, MD  lisinopril (PRINIVIL,ZESTRIL) 2.5 MG tablet Take 2.5 mg by mouth daily.  02/12/16  Yes Historical Provider, MD  metFORMIN (GLUCOPHAGE) 1000 MG tablet Take 1,000 mg by mouth 2 (two) times daily with a meal.   Yes Historical Provider, MD  Multiple Vitamin (MULTIVITAMIN) tablet Take 1 tablet by mouth daily. Centrum silver   Yes Historical Provider, MD  pioglitazone (ACTOS) 45 MG tablet Take 45 mg  by mouth daily. 05/26/16  Yes Historical Provider, MD  simvastatin (ZOCOR) 80 MG tablet Take 80 mg by mouth at bedtime.   Yes Historical Provider, MD  sodium chloride (OCEAN) 0.65 % SOLN nasal spray Place 1 spray into both nostrils as needed for congestion.   Yes Historical Provider, MD  Albuterol Sulfate (PROAIR RESPICLICK) 123XX123 (90 Base) MCG/ACT AEPB Inhale 1-2 puffs into the lungs every 4 (four) hours as needed. Patient not taking: Reported on 05/28/2016 10/06/15   Gregor Hams, MD    Family History Family History  Problem Relation Age of Onset  . Actinic keratosis Mother   . Cancer Mother     uterine  . Heart disease Father   . Hyperlipidemia Father   . Hypertension Father   . Stroke Father   . Cancer Sister     lung    Social History Social History  Substance Use Topics  . Smoking status: Never Smoker  . Smokeless tobacco: Never Used  . Alcohol use No     Allergies   Codeine   Review of Systems Review of Systems  ROS reviewed and all otherwise negative except for mentioned in HPI   Physical Exam Updated Vital Signs BP 121/71   Pulse (!) 54   Temp 97.9 F (36.6 C) (Oral)   Resp 15   Ht 5\' 5"  (1.651 m)   Wt 78.5 kg   SpO2 98%   BMI 28.79 kg/m  Vitals reviewed Physical Exam Physical Examination: General appearance - alert, well appearing, and in no distress Mental status - alert, oriented to person, place, and time Eyes -no conjunctival injection no scleral icterus Mouth - mucous membranes moist, pharynx normal without lesions Neck - supple, no significant adenopathy Chest - clear to auscultation, no wheezes, rales or rhonchi, symmetric air entry Heart - normal rate, regular rhythm, normal S1, S2, no murmurs, rubs, clicks or gallops Abdomen - soft, nontender, nondistended, no masses or organomegaly Neurological - alert, oriented, normal speech,  Extremities - peripheral pulses normal, no pedal edema, no clubbing or cyanosis Skin - normal coloration and  turgor, no rashes  ED Treatments / Results  Labs (all labs ordered are listed, but only abnormal results are displayed) Labs Reviewed  CBC - Abnormal; Notable for the following:       Result Value   RBC 3.77 (*)    Hemoglobin 11.3 (*)    HCT 34.7 (*)    RDW 16.9 (*)    All other components within normal limits  BASIC METABOLIC PANEL - Abnormal; Notable for the following:    Potassium 3.1 (*)    Glucose, Bld 188 (*)    BUN 25 (*)    Creatinine, Ser 2.02 (*)    Calcium 8.6 (*)    GFR calc non Af Amer 22 (*)    GFR  calc Af Amer 25 (*)    All other components within normal limits  URINALYSIS, ROUTINE W REFLEX MICROSCOPIC - Abnormal; Notable for the following:    Glucose, UA 50 (*)    Protein, ur 100 (*)    Nitrite POSITIVE (*)    Leukocytes, UA TRACE (*)    Bacteria, UA MANY (*)    Squamous Epithelial / LPF 0-5 (*)    All other components within normal limits  CBG MONITORING, ED - Abnormal; Notable for the following:    Glucose-Capillary 50 (*)    All other components within normal limits  CBG MONITORING, ED - Abnormal; Notable for the following:    Glucose-Capillary 116 (*)    All other components within normal limits  URINE CULTURE  URINE CULTURE  HEMOGLOBIN A1C  MAGNESIUM  CBG MONITORING, ED    EKG  EKG Interpretation  Date/Time:  Wednesday May 28 2016 08:14:26 EST Ventricular Rate:  61 PR Interval:    QRS Duration: 100 QT Interval:  394 QTC Calculation: 397 R Axis:   72 Text Interpretation:  Sinus rhythm Probable LVH with secondary repol abnrm No significant change since last tracing but there is some baseline wander which makes comparison difficult Confirmed by Canary Brim  MD, Maricela Kawahara [phone]) on 05/28/2016 8:58:04 AM       Radiology No results found.  Procedures Procedures (including critical care time)  Medications Ordered in ED Medications  0.9 %  sodium chloride infusion (not administered)  acetaminophen (TYLENOL) tablet 650 mg (not administered)     Or  acetaminophen (TYLENOL) suppository 650 mg (not administered)  traZODone (DESYREL) tablet 25 mg (not administered)  senna-docusate (Senokot-S) tablet 1 tablet (not administered)  bisacodyl (DULCOLAX) suppository 10 mg (not administered)  magnesium citrate solution 1 Bottle (not administered)  ondansetron (ZOFRAN) tablet 4 mg (not administered)    Or  ondansetron (ZOFRAN) injection 4 mg (not administered)  heparin injection 5,000 Units (not administered)  insulin aspart (novoLOG) injection 0-9 Units (not administered)  dextrose 5 %-0.45 % sodium chloride infusion (not administered)  cefTRIAXone (ROCEPHIN) 1 g in dextrose 5 % 50 mL IVPB (not administered)  atenolol (TENORMIN) tablet 50 mg (not administered)  lisinopril (PRINIVIL,ZESTRIL) tablet 2.5 mg (not administered)  amLODipine (NORVASC) tablet 2.5 mg (not administered)  hydrALAZINE (APRESOLINE) tablet 25 mg (not administered)  aspirin EC tablet 81 mg (not administered)  atorvastatin (LIPITOR) tablet 40 mg (not administered)  potassium chloride 30 mEq in sodium chloride 0.9 % 265 mL (KCL MULTIRUN) IVPB (not administered)  dextrose 50 % solution 50 mL (50 mLs Intravenous Given 05/28/16 0826)  cefTRIAXone (ROCEPHIN) 1 g in dextrose 5 % 50 mL IVPB (1 g Intravenous New Bag/Given 05/28/16 1020)     Initial Impression / Assessment and Plan / ED Course  I have reviewed the triage vital signs and the nursing notes.  Pertinent labs & imaging results that were available during my care of the patient were reviewed by me and considered in my medical decision making (see chart for details).  Clinical Course   10:23 AM d/w [facility] hospitalist team- pt to go to Dr. Marily Memos, med/surg observation.    Pt presenting with c/o hypoglycemia- she has had recurrent hypoglycemia after initially responding to D50.  She has some renal insufficiency as well as UTI.  Pt started on IV rocephin.  Admitted to hospitalist service.    Final Clinical  Impressions(s) / ED Diagnoses   Final diagnoses:  Hypoglycemia  Renal insufficiency  Urinary tract infection without  hematuria, site unspecified    New Prescriptions New Prescriptions   No medications on file     Alfonzo Beers, MD 05/28/16 1526

## 2016-05-28 NOTE — ED Triage Notes (Signed)
Pt arrives to ed by [facility] for hypoglycemia. Son states around 5am he heard his mother making some noises and was having some slurred speech. On ems arrival pts blood sugar was 50. Ems gave 15G D10 and cbg ith ems was 90 PtA. Pt is alert and ox4, no slurred speech.

## 2016-05-28 NOTE — Progress Notes (Signed)
Pharmacy Antibiotic Note  Debbie [name] is a 82 y.o. female admitted on 05/28/2016 with UTI.  Pharmacy has been consulted for ceftriaxone dosing. First dose of ceftriaxone given in ED at 0930. WBC wnl and patient is afebrile. As ceftriaxone does not require renal dose adjustment, pharmacy will sign off.   Plan: Ceftriaxone 1gm IV q24h Monitor clinical progress, c/s, ability to de-escalate, and length of therapy  Height: 5\' 5"  (165.1 cm) Weight: 173 lb (78.5 kg) IBW/kg (Calculated) : 57  Temp (24hrs), Avg:97.9 F (36.6 C), Min:97.9 F (36.6 C), Max:97.9 F (36.6 C)   Recent Labs Lab 05/28/16 0837  WBC 5.7  CREATININE 2.02*    Estimated Creatinine Clearance: 21.9 mL/min (by C-G formula based on SCr of 2.02 mg/dL (H)).    Allergies  Allergen Reactions  . Codeine Other (See Comments)    Unusual mouth sensation    Antimicrobials this admission: Ceftriaxone 12/20 >>  Microbiology results: 12/20 UCx: Pending   Thank you for allowing pharmacy to be a part of this patient's care.  Dierdre Harness, [place], PharmD Clinical Pharmacy Resident [phone] (Pager) 05/28/2016 10:53 AM

## 2016-05-28 NOTE — ED Notes (Signed)
Pt's CBG 238.  Informed Estill Batten, RN.

## 2016-05-28 NOTE — ED Notes (Signed)
Pt placed on the bedpan.  Pericare performed when removed from the bedpan.

## 2016-05-28 NOTE — ED Notes (Signed)
Patient's blood sugar 70. Given apple juice and crackers.

## 2016-05-28 NOTE — ED Notes (Signed)
ED Provider at bedside. 

## 2016-05-28 NOTE — ED Notes (Signed)
Pt's IV Infusion complete. Channel turned off, per [employer], Therapist, sports.

## 2016-05-28 NOTE — H&P (Signed)
History and Physical    Debbie [name] [id number] DOB: 10-Feb-1931 DOA: 05/28/2016   PCP: Myriam Jacobson, MD   Patient coming from:  Home    Chief Complaint: Confusion   HPI: Debbie [name] is a 82 y.o. female with medical history significant for DM, history of endometrial and vaginal Ca s/p radiation in 04/2016, presenting with an episode of confusion. Son reported the patient to have slurred speech at the time, without any motor weakness or other sensory abnormalities. On presentation she was noted to have a blood sugar of 50, receiving D50 with immediate improvement of her symptoms. At the time of evaluation she is coherent and without neuro deficits. She admits to poor water intake, but normal appetite. The patient denies any recent infections or hospitalizations. She denies any fever or chills night sweats, shortness of breath and cough, nausea vomiting or abdominal pain.  She has chronic radiation induced loose stools. She denies any suprapubic pain. She denies any trouble with urination, including hesitancy or frequency. No syncope, syncope or falls. No recent changes in her medications, and she is compliant to them. Lives alone. She does not recall overdosing on her oral antiglycemics.     ED Course:  BP 121/71   Pulse (!) 54   Temp 97.9 F (36.6 C) (Oral)   Resp 15   Ht 5\' 5"  (1.651 m)   Wt 78.5 kg (173 lb)   SpO2 98%   BMI 28.79 kg/m    white count 5.7 hemoglobin 11.3 platelets 256  potassium 3.1  creatinine 2.02 calcium 8.6 glucose 188 urine with positive nitrites EKG sinus rhythm  Review of Systems: As per HPI otherwise 10 point review of systems negative.   Past Medical History:  Diagnosis Date  . Allergy   . Anxiety   . Arthritis   . Cancer ([place]) dx'd 12/2007   endometroid adenocarcinoma  . Cataract    both  . Diabetes mellitus    fasting [id number]  . Dizziness   . History of radiation therapy 9/21,9/28,10/05,05/20/2008   4 txs 2400 cGy  endometrial adenocarcinoma  . Hypertension   . Peripheral vascular disease ([place])   . Seasonal allergies     Past Surgical History:  Procedure Laterality Date  . ABDOMINAL HYSTERECTOMY    . APPENDECTOMY    . ENDARTERECTOMY Left 01/12/2014   Procedure: LEFT CAROTID ARTERY ENDARTERECTOMY WITH HEMASHIELD PATCH ANGIOPLASTY;  Surgeon: Mal Misty, MD;  Location: [facility];  Service: Vascular;  Laterality: Left;  . ENDARTERECTOMY Right 03/17/2014   Procedure: RIGHT CAROTD ARTERY ENDARTERECTOMY WITH DACRON PATCH ANGIOPLASTY;  Surgeon: Mal Misty, MD;  Location: [facility];  Service: Vascular;  Laterality: Right;  . EYE SURGERY Bilateral    cataracts  . NASAL SEPTUM SURGERY    . NECK SURGERY Bilateral    arterial  . TONSILLECTOMY      Social History Social History   Social History  . Marital status: Married    Spouse name: N/A  . Number of children: N/A  . Years of education: N/A   Occupational History  . retired     Social History Main Topics  . Smoking status: Never Smoker  . Smokeless tobacco: Never Used  . Alcohol use No  . Drug use: No  . Sexual activity: No   Other Topics Concern  . Not on file   Social History Narrative  . No narrative on file     Allergies  Allergen Reactions  . Codeine Other (  See Comments)    Unusual mouth sensation    Family History  Problem Relation Age of Onset  . Actinic keratosis Mother   . Cancer Mother     uterine  . Heart disease Father   . Hyperlipidemia Father   . Hypertension Father   . Stroke Father   . Cancer Sister     lung      Prior to Admission medications   Medication Sig Start Date End Date Taking? Authorizing Provider  Albuterol Sulfate (PROAIR RESPICLICK) 123XX123 (90 Base) MCG/ACT AEPB Inhale 1-2 puffs into the lungs every 4 (four) hours as needed. 10/06/15   Gregor Hams, MD  amLODipine (NORVASC) 2.5 MG tablet  10/20/14   Historical Provider, MD  aspirin EC 81 MG tablet Take 81 mg by mouth daily.    Historical  Provider, MD  atenolol (TENORMIN) 100 MG tablet Take 100 mg by mouth daily. Reported on 10/06/2015    Historical Provider, MD  glimepiride (AMARYL) 4 MG tablet Take 4 mg by mouth daily with breakfast.    Historical Provider, MD  hydrALAZINE (APRESOLINE) 25 MG tablet Take 25 mg by mouth 2 (two) times daily.    Historical Provider, MD  lisinopril (PRINIVIL,ZESTRIL) 2.5 MG tablet  02/12/16   Historical Provider, MD  lisinopril-hydrochlorothiazide (PRINZIDE,ZESTORETIC) 20-25 MG per tablet Take 2 tablets by mouth daily.  10/15/12   Historical Provider, MD  metFORMIN (GLUCOPHAGE) 1000 MG tablet Take 1,000 mg by mouth 2 (two) times daily with a meal.    Historical Provider, MD  Multiple Vitamin (MULTIVITAMIN) tablet Take 1 tablet by mouth daily. Centrum silver    Historical Provider, MD  simvastatin (ZOCOR) 80 MG tablet Take 80 mg by mouth at bedtime.    Historical Provider, MD  sodium chloride (OCEAN) 0.65 % SOLN nasal spray Place 1 spray into both nostrils as needed for congestion.    Historical Provider, MD    Physical Exam:    Vitals:   05/28/16 0900 05/28/16 0930 05/28/16 0945 05/28/16 1000  BP: 143/93 (!) 105/50 111/74 121/71  Pulse: 60 (!) 49 (!) 55 (!) 54  Resp: 17 15 14 15   Temp:      TempSrc:      SpO2: 98% 96% 99% 98%  Weight:      Height:           Constitutional: NAD, calm, comfortable   Vitals:   05/28/16 0900 05/28/16 0930 05/28/16 0945 05/28/16 1000  BP: 143/93 (!) 105/50 111/74 121/71  Pulse: 60 (!) 49 (!) 55 (!) 54  Resp: 17 15 14 15   Temp:      TempSrc:      SpO2: 98% 96% 99% 98%  Weight:      Height:       Eyes: PERRL, lids and conjunctivae normal ENMT: Mucous membranes are moist. Posterior pharynx clear of any exudate or lesions.Normal dentition.  Neck: normal, supple, no masses, no thyromegaly Respiratory: clear to auscultation bilaterally, no wheezing, no crackles. Normal respiratory effort. No accessory muscle use.  Cardiovascular: Regular rate and rhythm,  no murmurs / rubs / gallops. Mild pedal edema. 2+ pedal pulses. No carotid bruits.  Abdomen: no tenderness, no masses palpated. No hepatosplenomegaly. Bowel sounds positive.  No suprapubic tenderness  Musculoskeletal: no clubbing / cyanosis. No joint deformity upper and lower extremities. Good ROM, no contractures. Normal muscle tone.  Skin: no rashes, lesions, ulcers.  Neurologic: CN 2-12 grossly intact. Sensation intact, DTR normal. Strength 5/5 in all 4.  Psychiatric:  Normal judgment and insight. Alert and oriented x 3. Normal mood.     Labs on Admission: I have personally reviewed following labs and imaging studies  CBC:  Recent Labs Lab 05/28/16 0837  WBC 5.7  HGB 11.3*  HCT 34.7*  MCV 92.0  PLT 123456    Basic Metabolic Panel:  Recent Labs Lab 05/28/16 0837  NA 143  K 3.1*  CL 107  CO2 26  GLUCOSE 188*  BUN 25*  CREATININE 2.02*  CALCIUM 8.6*    GFR: Estimated Creatinine Clearance: 21.9 mL/min (by C-G formula based on SCr of 2.02 mg/dL (H)).  Liver Function Tests: No results for input(s): AST, ALT, ALKPHOS, BILITOT, PROT, ALBUMIN in the last 168 hours. No results for input(s): LIPASE, AMYLASE in the last 168 hours. No results for input(s): AMMONIA in the last 168 hours.  Coagulation Profile: No results for input(s): INR, PROTIME in the last 168 hours.  Cardiac Enzymes: No results for input(s): CKTOTAL, CKMB, CKMBINDEX, TROPONINI in the last 168 hours.  BNP (last 3 results) No results for input(s): PROBNP in the last 8760 hours.  HbA1C: No results for input(s): HGBA1C in the last 72 hours.  CBG:  Recent Labs Lab 05/28/16 0821 05/28/16 0912 05/28/16 1015  GLUCAP 50* 116* 70    Lipid Profile: No results for input(s): CHOL, HDL, LDLCALC, TRIG, CHOLHDL, LDLDIRECT in the last 72 hours.  Thyroid Function Tests: No results for input(s): TSH, T4TOTAL, FREET4, T3FREE, THYROIDAB in the last 72 hours.  Anemia Panel: No results for input(s):  VITAMINB12, FOLATE, FERRITIN, TIBC, IRON, RETICCTPCT in the last 72 hours.  Urine analysis:    Component Value Date/Time   COLORURINE YELLOW 05/28/2016 0848   APPEARANCEUR CLEAR 05/28/2016 0848   LABSPEC 1.008 05/28/2016 0848   PHURINE 6.0 05/28/2016 0848   GLUCOSEU 50 (A) 05/28/2016 0848   HGBUR NEGATIVE 05/28/2016 0848   BILIRUBINUR NEGATIVE 05/28/2016 0848   KETONESUR NEGATIVE 05/28/2016 0848   PROTEINUR 100 (A) 05/28/2016 0848   UROBILINOGEN 1.0 03/07/2014 1510   NITRITE POSITIVE (A) 05/28/2016 0848   LEUKOCYTESUR TRACE (A) 05/28/2016 0848    Sepsis Labs: @LABRCNTIP (procalcitonin:4,lacticidven:4) )No results found for this or any previous visit (from the past 240 hour(s)).   Radiological Exams on Admission: No results found.  EKG: Independently reviewed.  Assessment/Plan Active Problems:   UTI (urinary tract infection)   Hypoglycemia   Malignant neoplasm of corpus uteri, except isthmus ([facility])   Carotid stenosis, asymptomatic   Secondary malignant neoplasm of vagina ([facility])   Diabetes ([place])   Anxiety   Hypertension    Hypoglycemic event in a patient with DM2. BS was 50, requiring D50 with improvement of symptoms. She denies OD with oral hypoglycemics.  Current blood sugar level is 180 after D50 therapy  Lab Results  Component Value Date   HGBA1C 6.4 (H) 03/17/2014  Hgb A1C Hold home oral diabetic medications.  SSI Monitor CBG q 2 h x4    UTI, in the setting of radiation induced chronic diarrhea. Afebrile, VSS  UA + nitrites and  traceleukocytes in urine. WBC is 5.7 Received IV Rocephin  In ED, IVF. Non septic appearing    F/u urine culture Ceftriaxone IV  Follow CBC in am   IVF  At 75 cc/h   Acute on Chronic kidney disease stage 3   baseline creatinine 1.5-1.6   Current Cr 2.02, likely due to dehydration and meds  Lab Results  Component Value Date   CREATININE 2.02 (H) 05/28/2016  CREATININE 1.9 (H) 01/04/2016   CREATININE 1.41 (H) 03/18/2014    IVF Repeat CMET in am   Hypertension BP 121/71   Controlled Continue home anti-hypertensive medications, hold CE I today   Hyperlipidemia Continue home statins  Hypokalemia, may be due to diuretics and chronic radiation induced diarrhea . EKG SR,  Current K 3.1   KCL 30 meq per  K protocol  Check Mg Repeat CMET in am    Chronic diarrhea, as above. No blood in stools, no worsening symptoms Continue Imodium prn    DVT prophylaxis: Heparin  Code Status:   Full     Family Communication:  Discussed with patient and son  Disposition Plan: Expect patient to be discharged to home after condition improves Consults called:    None Admission status: Obs  Medsurg      Barnabas Henriques E, PA-C [facility]   05/28/2016, 11:20 AM

## 2016-05-29 ENCOUNTER — Ambulatory Visit: Payer: Medicare HMO | Admitting: Radiation Oncology

## 2016-05-29 ENCOUNTER — Encounter (HOSPITAL_COMMUNITY): Payer: Self-pay | Admitting: Family Medicine

## 2016-05-29 ENCOUNTER — Observation Stay (HOSPITAL_COMMUNITY): Payer: Medicare HMO

## 2016-05-29 DIAGNOSIS — I6529 Occlusion and stenosis of unspecified carotid artery: Secondary | ICD-10-CM | POA: Diagnosis present

## 2016-05-29 DIAGNOSIS — Z8249 Family history of ischemic heart disease and other diseases of the circulatory system: Secondary | ICD-10-CM | POA: Diagnosis not present

## 2016-05-29 DIAGNOSIS — R0603 Acute respiratory distress: Secondary | ICD-10-CM | POA: Diagnosis present

## 2016-05-29 DIAGNOSIS — L8990 Pressure ulcer of unspecified site, unspecified stage: Secondary | ICD-10-CM | POA: Diagnosis present

## 2016-05-29 DIAGNOSIS — R4781 Slurred speech: Secondary | ICD-10-CM | POA: Diagnosis present

## 2016-05-29 DIAGNOSIS — E876 Hypokalemia: Secondary | ICD-10-CM | POA: Diagnosis present

## 2016-05-29 DIAGNOSIS — Z823 Family history of stroke: Secondary | ICD-10-CM | POA: Diagnosis not present

## 2016-05-29 DIAGNOSIS — I6523 Occlusion and stenosis of bilateral carotid arteries: Secondary | ICD-10-CM | POA: Diagnosis not present

## 2016-05-29 DIAGNOSIS — C549 Malignant neoplasm of corpus uteri, unspecified: Secondary | ICD-10-CM | POA: Diagnosis present

## 2016-05-29 DIAGNOSIS — F419 Anxiety disorder, unspecified: Secondary | ICD-10-CM | POA: Diagnosis present

## 2016-05-29 DIAGNOSIS — I5030 Unspecified diastolic (congestive) heart failure: Secondary | ICD-10-CM | POA: Diagnosis present

## 2016-05-29 DIAGNOSIS — E11649 Type 2 diabetes mellitus with hypoglycemia without coma: Secondary | ICD-10-CM | POA: Diagnosis present

## 2016-05-29 DIAGNOSIS — E162 Hypoglycemia, unspecified: Secondary | ICD-10-CM | POA: Diagnosis not present

## 2016-05-29 DIAGNOSIS — R0600 Dyspnea, unspecified: Secondary | ICD-10-CM | POA: Diagnosis not present

## 2016-05-29 DIAGNOSIS — J9601 Acute respiratory failure with hypoxia: Secondary | ICD-10-CM | POA: Diagnosis present

## 2016-05-29 DIAGNOSIS — C52 Malignant neoplasm of vagina: Secondary | ICD-10-CM | POA: Diagnosis present

## 2016-05-29 DIAGNOSIS — E785 Hyperlipidemia, unspecified: Secondary | ICD-10-CM | POA: Diagnosis present

## 2016-05-29 DIAGNOSIS — E1151 Type 2 diabetes mellitus with diabetic peripheral angiopathy without gangrene: Secondary | ICD-10-CM | POA: Diagnosis present

## 2016-05-29 DIAGNOSIS — N183 Chronic kidney disease, stage 3 (moderate): Secondary | ICD-10-CM | POA: Diagnosis present

## 2016-05-29 DIAGNOSIS — N390 Urinary tract infection, site not specified: Secondary | ICD-10-CM | POA: Diagnosis not present

## 2016-05-29 DIAGNOSIS — K520 Gastroenteritis and colitis due to radiation: Secondary | ICD-10-CM | POA: Diagnosis present

## 2016-05-29 DIAGNOSIS — I130 Hypertensive heart and chronic kidney disease with heart failure and stage 1 through stage 4 chronic kidney disease, or unspecified chronic kidney disease: Secondary | ICD-10-CM | POA: Diagnosis present

## 2016-05-29 DIAGNOSIS — Z923 Personal history of irradiation: Secondary | ICD-10-CM | POA: Diagnosis not present

## 2016-05-29 DIAGNOSIS — C541 Malignant neoplasm of endometrium: Secondary | ICD-10-CM | POA: Diagnosis present

## 2016-05-29 DIAGNOSIS — Z801 Family history of malignant neoplasm of trachea, bronchus and lung: Secondary | ICD-10-CM | POA: Diagnosis not present

## 2016-05-29 DIAGNOSIS — Z7984 Long term (current) use of oral hypoglycemic drugs: Secondary | ICD-10-CM | POA: Diagnosis not present

## 2016-05-29 DIAGNOSIS — Z7982 Long term (current) use of aspirin: Secondary | ICD-10-CM | POA: Diagnosis not present

## 2016-05-29 DIAGNOSIS — I10 Essential (primary) hypertension: Secondary | ICD-10-CM | POA: Diagnosis not present

## 2016-05-29 DIAGNOSIS — Z8049 Family history of malignant neoplasm of other genital organs: Secondary | ICD-10-CM | POA: Diagnosis not present

## 2016-05-29 DIAGNOSIS — Z87891 Personal history of nicotine dependence: Secondary | ICD-10-CM | POA: Diagnosis not present

## 2016-05-29 LAB — COMPREHENSIVE METABOLIC PANEL
ALK PHOS: 69 U/L (ref 38–126)
ALT: 21 U/L (ref 14–54)
AST: 27 U/L (ref 15–41)
Albumin: 2.4 g/dL — ABNORMAL LOW (ref 3.5–5.0)
Anion gap: 6 (ref 5–15)
BUN: 20 mg/dL (ref 6–20)
CALCIUM: 8.5 mg/dL — AB (ref 8.9–10.3)
CO2: 30 mmol/L (ref 22–32)
CREATININE: 1.83 mg/dL — AB (ref 0.44–1.00)
Chloride: 106 mmol/L (ref 101–111)
GFR calc non Af Amer: 24 mL/min — ABNORMAL LOW (ref >60)
GFR, EST AFRICAN AMERICAN: 28 mL/min — AB (ref >60)
Glucose, Bld: 92 mg/dL (ref 65–99)
Potassium: 4.3 mmol/L (ref 3.5–5.1)
SODIUM: 142 mmol/L (ref 135–145)
Total Bilirubin: 0.6 mg/dL (ref 0.3–1.2)
Total Protein: 5.5 g/dL — ABNORMAL LOW (ref 6.5–8.1)

## 2016-05-29 LAB — GLUCOSE, CAPILLARY
GLUCOSE-CAPILLARY: 222 mg/dL — AB (ref 65–99)
GLUCOSE-CAPILLARY: 234 mg/dL — AB (ref 65–99)
GLUCOSE-CAPILLARY: 57 mg/dL — AB (ref 65–99)
GLUCOSE-CAPILLARY: 94 mg/dL (ref 65–99)
Glucose-Capillary: 116 mg/dL — ABNORMAL HIGH (ref 65–99)
Glucose-Capillary: 331 mg/dL — ABNORMAL HIGH (ref 65–99)
Glucose-Capillary: 76 mg/dL (ref 65–99)
Glucose-Capillary: 87 mg/dL (ref 65–99)

## 2016-05-29 LAB — HEMOGLOBIN A1C
HEMOGLOBIN A1C: 5.4 % (ref 4.8–5.6)
MEAN PLASMA GLUCOSE: 108 mg/dL

## 2016-05-29 LAB — CBC
HCT: 34.8 % — ABNORMAL LOW (ref 36.0–46.0)
Hemoglobin: 11 g/dL — ABNORMAL LOW (ref 12.0–15.0)
MCH: 29.5 pg (ref 26.0–34.0)
MCHC: 31.6 g/dL (ref 30.0–36.0)
MCV: 93.3 fL (ref 78.0–100.0)
PLATELETS: 272 10*3/uL (ref 150–400)
RBC: 3.73 MIL/uL — AB (ref 3.87–5.11)
RDW: 17.2 % — AB (ref 11.5–15.5)
WBC: 8 10*3/uL (ref 4.0–10.5)

## 2016-05-29 MED ORDER — AZITHROMYCIN 500 MG PO TABS
500.0000 mg | ORAL_TABLET | Freq: Every day | ORAL | Status: DC
  Administered 2016-05-29 – 2016-06-02 (×5): 500 mg via ORAL
  Filled 2016-05-29 (×5): qty 1

## 2016-05-29 MED ORDER — IPRATROPIUM-ALBUTEROL 0.5-2.5 (3) MG/3ML IN SOLN
3.0000 mL | RESPIRATORY_TRACT | Status: DC
  Administered 2016-05-29 – 2016-05-30 (×6): 3 mL via RESPIRATORY_TRACT
  Filled 2016-05-29 (×8): qty 3

## 2016-05-29 MED ORDER — INSULIN ASPART 100 UNIT/ML ~~LOC~~ SOLN
3.0000 [IU] | Freq: Three times a day (TID) | SUBCUTANEOUS | Status: DC
  Administered 2016-05-29 – 2016-05-31 (×6): 3 [IU] via SUBCUTANEOUS

## 2016-05-29 MED ORDER — FUROSEMIDE 10 MG/ML IJ SOLN
40.0000 mg | Freq: Once | INTRAMUSCULAR | Status: AC
  Administered 2016-05-29: 40 mg via INTRAVENOUS
  Filled 2016-05-29: qty 4

## 2016-05-29 MED ORDER — ALBUTEROL SULFATE (2.5 MG/3ML) 0.083% IN NEBU
2.5000 mg | INHALATION_SOLUTION | Freq: Once | RESPIRATORY_TRACT | Status: AC
  Administered 2016-05-29: 2.5 mg via RESPIRATORY_TRACT
  Filled 2016-05-29: qty 3

## 2016-05-29 MED ORDER — METHYLPREDNISOLONE SODIUM SUCC 125 MG IJ SOLR
125.0000 mg | INTRAMUSCULAR | Status: AC
  Administered 2016-05-29: 125 mg via INTRAVENOUS
  Filled 2016-05-29: qty 2

## 2016-05-29 NOTE — Progress Notes (Signed)
PROGRESS NOTE    Debbie [name]  [id number]  DOB: 1930/12/12  DOA: 05/28/2016 PCP: Myriam Jacobson, MD  Hospital course: 82 y.o. female with medical history significant for DM, history of endometrial and vaginal Ca s/p radiation in 04/2016, presenting with an episode of confusion. Son reported the patient to have slurred speech at the time, without any motor weakness or other sensory abnormalities. On presentation she was noted to have a blood sugar of 50, receiving D50 with immediate improvement of her symptoms. At the time of evaluation she is coherent and without neuro deficits. She admits to poor water intake, but normal appetite. The patient denies any recent infections or hospitalizations. She denies any fever or chills night sweats, shortness of breath and cough, nausea vomiting or abdominal pain.  She has chronic radiation induced loose stools. She denies any suprapubic pain. She denies any trouble with urination, including hesitancy or frequency. No syncope, syncope or falls. No recent changes in her medications, and she is compliant to them. Lives alone. She does not recall overdosing on her oral antiglycemics.   Assessment & Plan:   Acute Respiratory Distress - Pt with diffuse wheezing, SOB, on exam this morning, ordered stat Duoneb treatment x 2, with scheduled nebs, stat CXR, cough, chest congestion, added azithromycin.  Reviewed CXR at bedside.  Pt not medically stable for discharge at this time. Dc fluids.    Hypoglycemic event in a patient with DM2. BS was 50, requiring D50 with improvement of symptoms. She denies OD with oral hypoglycemics.  Current blood sugar level is 180 after D50 therapy.  Wound not restart amaryl.  It is too risky for her to take this any longer. Recent Labs       Lab Results  Component Value Date   HGBA1C 6.4 (H) 03/17/2014    Hgb A1C Hold home oral diabetic medications.  SSI Monitor CBG q 2 h x4   CBG (last 3)   Recent Labs  05/29/16 0256 05/29/16 0513 05/29/16 0805  GLUCAP 87 94 116*   UTI, in the setting of radiation induced chronic diarrhea. Afebrile, VSS  UA + nitrites and  traceleukocytes in urine. WBC is 5.7 Received IV Rocephin  In ED, IVF. Non septic appearing    F/u urine culture Ceftriaxone IV  Follow CBC in am   DC IVF.    Acute on Chronic kidney disease stage 3   baseline creatinine 1.5-1.6   Current Cr 2.02, likely due to dehydration and meds  Recent Labs       Lab Results  Component Value Date   CREATININE 2.02 (H) 05/28/2016   CREATININE 1.9 (H) 01/04/2016   CREATININE 1.41 (H) 03/18/2014     IVF Repeat CMET in am   Hypertension BP 121/71   Controlled Continue home anti-hypertensive medications, hold CE I today   Hyperlipidemia Continue home statins  Hypokalemia, may be due to diuretics and chronic radiation induced diarrhea . EKG SR,  Current K 3.1   KCL 30 meq per  K protocol  Check Mg Repeat CMET in am   Chronic diarrhea, as above. No blood in stools, no worsening symptoms Continue Imodium prn   DVT prophylaxis: Heparin  Code Status:   Full     Family Communication:  son at bedside  Disposition Plan: Expect patient to be discharged to home after condition improves Consults called:    None    Subjective: Pt wheezing really bad and having difficult time breathing.    Objective:  Vitals:   05/28/16 2153 05/29/16 0515 05/29/16 0522 05/29/16 0835  BP: 135/81 (!) 154/80    Pulse: 72 66    Resp: 17 17    Temp: 97.8 F (36.6 C) 97.9 F (36.6 C)    TempSrc: Oral Oral    SpO2: 92% (!) 88% 97% 95%  Weight:  80.3 kg (177 lb)    Height:        Intake/Output Summary (Last 24 hours) at 05/29/16 0910 Last data filed at 05/29/16 0644  Gross per 24 hour  Intake             1260 ml  Output              500 ml  Net              760 ml   Filed Weights   05/28/16 0811 05/28/16 1645 05/29/16 0515  Weight: 78.5 kg (173 lb) 78.7 kg (173 lb 9.6 oz) 80.3 kg  (177 lb)   Exam:  General exam: awake alert with audible wheezing, some mild tachypnea.   Respiratory system: bilateral insp exp wheeze with shallow breathing. Cardiovascular system: S1 & S2 heard, RRR. No JVD, murmurs, gallops, clicks or pedal edema. Gastrointestinal system: Abdomen is nondistended, soft and nontender. Normal bowel sounds heard. Central nervous system: Alert and oriented. No focal neurological deficits. Extremities: mild pretibial edema.  Data Reviewed: Basic Metabolic Panel:  Recent Labs Lab 05/28/16 0837 05/29/16 0501  NA 143 142  K 3.1* 4.3  CL 107 106  CO2 26 30  GLUCOSE 188* 92  BUN 25* 20  CREATININE 2.02* 1.83*  CALCIUM 8.6* 8.5*  MG 1.7  --    Liver Function Tests:  Recent Labs Lab 05/29/16 0501  AST 27  ALT 21  ALKPHOS 69  BILITOT 0.6  PROT 5.5*  ALBUMIN 2.4*   No results for input(s): LIPASE, AMYLASE in the last 168 hours. No results for input(s): AMMONIA in the last 168 hours. CBC:  Recent Labs Lab 05/28/16 0837 05/29/16 0501  WBC 5.7 8.0  HGB 11.3* 11.0*  HCT 34.7* 34.8*  MCV 92.0 93.3  PLT 256 272   Cardiac Enzymes: No results for input(s): CKTOTAL, CKMB, CKMBINDEX, TROPONINI in the last 168 hours. CBG (last 3)   Recent Labs  05/29/16 0256 05/29/16 0513 05/29/16 0805  GLUCAP 87 94 116*   No results found for this or any previous visit (from the past 240 hour(s)).   Studies: No results found.   Scheduled Meds: . amLODipine  2.5 mg Oral Daily  . aspirin EC  81 mg Oral Daily  . atenolol  50 mg Oral BID  . atorvastatin  40 mg Oral q1800  . azithromycin  500 mg Oral Daily  . cefTRIAXone (ROCEPHIN)  IV  1 g Intravenous Q24H  . heparin  5,000 Units Subcutaneous Q8H  . hydrALAZINE  25 mg Oral TID  . insulin aspart  0-9 Units Subcutaneous TID WC  . ipratropium-albuterol  3 mL Nebulization Q4H  . lisinopril  2.5 mg Oral Daily  . methylPREDNISolone (SOLU-MEDROL) injection  125 mg Intravenous STAT   Continuous  Infusions: . dextrose 75 mL/hr at 05/28/16 2351    Active Problems:   Malignant neoplasm of corpus uteri, except isthmus (HCC)   Carotid stenosis, asymptomatic   Secondary malignant neoplasm of vagina (HCC)   UTI (urinary tract infection)   Hypoglycemia   Diabetes ([place])   Anxiety   Hypertension   Pressure injury of skin  Time spent:   Irwin Brakeman, MD, FAAFP [facility] Pager [phone]  If 7PM-7AM, please contact night-coverage [email or web address] Password [email or web address] 05/29/2016, 9:10 AM    LOS: 0 days

## 2016-05-29 NOTE — Progress Notes (Signed)
Hypoglycemic Event  CBG:57  Treatment: 15 GM carbohydrate snack  Symptoms: None  Follow-up CBG: Time:0049 CBG Result:76  Possible Reasons for Event: Unknown  Comments/MD notified:MD n call    Travarius Lange, [place]

## 2016-05-29 NOTE — Evaluation (Addendum)
Occupational Therapy Evaluation Patient Details Name: Debbie [name] MRN: [id number] DOB: 1930/09/05 Today's Date: 05/29/2016    History of Present Illness 82 y.o. female with medical history significant for DM, history of endometrial and vaginal Ca s/p radiation in 04/2016, presenting with an episode of confusion. Son reported the patient to have slurred speech at the time, without any motor weakness or other sensory abnormalities. On presentation she was noted to have a blood sugar of 50, receiving D50 with immediate improvement of her symptoms.    Clinical Impression   Pt reports she was independent with ADL PTA. Currently pt requires min assist for functional mobility with mod assist to correct multiple LOB during session. Pt needs min assist for seated UB ADL and mod assist for LB ADL. SpO2=75% on RA following functional mobility in room; reapplied 2L supplemental O2 with rise in SpO2 to 92% (RN notified). Pt planning to d/c home with 24/7 supervision from son and CNA. Recommending HHOT for follow up to maximize independence and safety with ADL and functional mobility upon return home. Pt would benefit from continued skilled OT to address established goals.    Follow Up Recommendations  Home health OT;Supervision/Assistance - 24 hour    Equipment Recommendations  3 in 1 bedside commode;Tub/shower bench    Recommendations for Other Services PT consult     Precautions / Restrictions Precautions Precautions: Fall Restrictions Weight Bearing Restrictions: No      Mobility Bed Mobility Overal bed mobility: Needs Assistance Bed Mobility: Supine to Sit;Sit to Supine     Supine to sit: Min assist Sit to supine: Min assist   General bed mobility comments: Assist to elevate trunk to sitting and for balance initially. Assist for LEs back to bed.  Transfers Overall transfer level: Needs assistance Equipment used: Rolling walker (2 wheeled) Transfers: Sit to/from Stand Sit  to Stand: Min assist         General transfer comment: VCs for hand placement and technique. Pt with LOB x2 with initial sit to stand from EOB; required mod assist to correct.    Balance Overall balance assessment: Needs assistance Sitting-balance support: Feet supported;Bilateral upper extremity supported Sitting balance-Leahy Scale: Poor     Standing balance support: No upper extremity supported;During functional activity Standing balance-Leahy Scale: Poor Standing balance comment: Min assist to maintain standing balance when washing hands at sink without UEs supported                            ADL Overall ADL's : Needs assistance/impaired Eating/Feeding: Set up;Sitting   Grooming: Minimal assistance;Standing;Wash/dry hands Grooming Details (indicate cue type and reason): Min assist for balance in standing Upper Body Bathing: Min guard;Sitting   Lower Body Bathing: Moderate assistance;Sit to/from stand   Upper Body Dressing : Minimal assistance;Sitting   Lower Body Dressing: Moderate assistance;Sit to/from stand   Toilet Transfer: Minimal assistance;Ambulation;Regular Toilet;Grab bars;RW Armed forces technical officer Details (indicate cue type and reason): Mod assist at times for LOB. Toileting- Water quality scientist and Hygiene: Min guard;Sitting/lateral lean Toileting - Clothing Manipulation Details (indicate cue type and reason): for peri care only     Functional mobility during ADLs: Minimal assistance;Rolling walker (multiple LOB requiring mod assist to correct) General ADL Comments: Discussed need for 24/7 supervision upon return home with pt and pts son; son reports he plans to hire CNA to be present with pt while he is at work during the day. Discussed follow up [facility] therapy; pt  and son are agreeable. SpO2=75% on RA following activity. Reapplied 2L supplemental O2 with rise in SpO2 to 92%.     Vision Vision Assessment?: No apparent visual deficits   Perception      Praxis      Pertinent Vitals/Pain Pain Assessment: No/denies pain     Hand Dominance     Extremity/Trunk Assessment Upper Extremity Assessment Upper Extremity Assessment: Overall WFL for tasks assessed   Lower Extremity Assessment Lower Extremity Assessment: Defer to PT evaluation   Cervical / Trunk Assessment Cervical / Trunk Assessment: Kyphotic   Communication Communication Communication: No difficulties   Cognition Arousal/Alertness: Awake/alert Behavior During Therapy: WFL for tasks assessed/performed Overall Cognitive Status: Within Functional Limits for tasks assessed                     General Comments       Exercises       Shoulder Instructions      Home Living Family/patient expects to be discharged to:: Private residence Living Arrangements: Children Available Help at Discharge: Family;Personal care attendant;Available 24 hours/day (son planning to hire CNA for daytime) Type of Home: House Home Access: Stairs to enter [employer] of Steps: 1   Home Layout: One level     Bathroom Shower/Tub: Tub/shower unit Shower/tub characteristics: Architectural technologist: Standard     Home Equipment: Environmental consultant - 2 wheels          Prior Functioning/Environment Level of Independence: Independent with assistive device(s)        Comments: RW for mobility. Independent with ADL.        OT Problem List: Decreased strength;Decreased activity tolerance;Impaired balance (sitting and/or standing);Decreased safety awareness;Decreased knowledge of use of DME or AE   OT Treatment/Interventions: Self-care/ADL training;Energy conservation;DME and/or AE instruction;Therapeutic activities;Patient/family education;Balance training    OT Goals(Current goals can be found in the care plan section) Acute Rehab OT Goals Patient Stated Goal: return home OT Goal Formulation: With patient/family Time For Goal Achievement: 06/12/16 Potential to Achieve  Goals: Good ADL Goals Pt Will Perform Grooming: with supervision;standing Pt Will Perform Upper Body Bathing: with supervision;sitting Pt Will Perform Lower Body Bathing: with supervision;sit to/from stand Pt Will Transfer to Toilet: with supervision;ambulating;bedside commode (over toilet) Pt Will Perform Toileting - Clothing Manipulation and hygiene: with supervision;sit to/from stand  OT Frequency: Min 2X/week   Barriers to D/C:            Co-evaluation              End of Session Equipment Utilized During Treatment: Gait belt;Rolling walker;Oxygen Nurse Communication: Mobility status;Other (comment) (SpO2, PT consult needed)  Activity Tolerance: Patient tolerated treatment well Patient left: in bed;with call bell/phone within reach;with bed alarm set;with family/visitor present   Time: [id number] OT Time Calculation (min): 18 min Charges:  OT General Charges $OT Visit: 1 Procedure OT Evaluation $OT Eval Moderate Complexity: 1 Procedure G-Codes: OT G-codes **NOT FOR INPATIENT CLASS** Functional Assessment Tool Used: Clinical judgement Functional Limitation: Self care Self Care Current Status [id number]): At least 20 percent but less than 40 percent impaired, limited or restricted Self Care Goal Status [id number]): At least 1 percent but less than 20 percent impaired, limited or restricted   Binnie Kand M.S., OTR/L Pager: [phone]  05/29/2016, 1:14 PM

## 2016-05-30 ENCOUNTER — Inpatient Hospital Stay (HOSPITAL_COMMUNITY): Payer: Medicare HMO

## 2016-05-30 DIAGNOSIS — R0600 Dyspnea, unspecified: Secondary | ICD-10-CM

## 2016-05-30 LAB — URINE CULTURE

## 2016-05-30 LAB — GLUCOSE, CAPILLARY
GLUCOSE-CAPILLARY: 122 mg/dL — AB (ref 65–99)
GLUCOSE-CAPILLARY: 101 mg/dL — AB (ref 65–99)
GLUCOSE-CAPILLARY: 150 mg/dL — AB (ref 65–99)

## 2016-05-30 LAB — BASIC METABOLIC PANEL
ANION GAP: 9 (ref 5–15)
BUN: 24 mg/dL — ABNORMAL HIGH (ref 6–20)
CHLORIDE: 104 mmol/L (ref 101–111)
CO2: 31 mmol/L (ref 22–32)
Calcium: 8.6 mg/dL — ABNORMAL LOW (ref 8.9–10.3)
Creatinine, Ser: 2.21 mg/dL — ABNORMAL HIGH (ref 0.44–1.00)
GFR calc non Af Amer: 19 mL/min — ABNORMAL LOW (ref >60)
GFR, EST AFRICAN AMERICAN: 23 mL/min — AB (ref >60)
Glucose, Bld: 168 mg/dL — ABNORMAL HIGH (ref 65–99)
POTASSIUM: 4.5 mmol/L (ref 3.5–5.1)
SODIUM: 144 mmol/L (ref 135–145)

## 2016-05-30 LAB — ECHOCARDIOGRAM COMPLETE
HEIGHTINCHES: 65 in
WEIGHTICAEL: 2742.4 [oz_av]

## 2016-05-30 MED ORDER — ALBUTEROL SULFATE (2.5 MG/3ML) 0.083% IN NEBU
2.5000 mg | INHALATION_SOLUTION | Freq: Four times a day (QID) | RESPIRATORY_TRACT | Status: DC | PRN
Start: 2016-05-30 — End: 2016-06-01

## 2016-05-30 MED ORDER — IPRATROPIUM-ALBUTEROL 0.5-2.5 (3) MG/3ML IN SOLN
3.0000 mL | Freq: Two times a day (BID) | RESPIRATORY_TRACT | Status: DC
Start: 2016-05-30 — End: 2016-06-01
  Administered 2016-05-30 – 2016-06-01 (×4): 3 mL via RESPIRATORY_TRACT
  Filled 2016-05-30 (×4): qty 3

## 2016-05-30 NOTE — Evaluation (Signed)
Physical Therapy Evaluation Patient Details Name: Debbie [name] MRN: [id number] DOB: 1931-04-24 Today's Date: 05/30/2016   History of Present Illness  82 y.o. female with medical history significant for DM, history of endometrial and vaginal Ca s/p radiation in 04/2016, presenting with an episode of confusion. Son reported the patient to have slurred speech at the time, without any motor weakness or other sensory abnormalities. On presentation she was noted to have a blood sugar of 50, receiving D50 with immediate improvement of her symptoms.   Clinical Impression  Patient presents s/p recent fall at home and decreased independence with mobility.  Feel she will benefit from skilled PT in the acute setting to allow d/c home with family support and follow up HHPT.     Follow Up Recommendations Home health PT;Supervision/Assistance - 24 hour    Equipment Recommendations       Recommendations for Other Services       Precautions / Restrictions Precautions Precautions: Fall Restrictions Weight Bearing Restrictions: No      Mobility  Bed Mobility Overal bed mobility: Needs Assistance Bed Mobility: Supine to Sit;Sit to Supine     Supine to sit: Supervision;HOB elevated Sit to supine: Min assist   General bed mobility comments: heavy use of rail to pull up; assist to scoot up in bed with rails  Transfers Overall transfer level: Needs assistance Equipment used: Rolling walker (2 wheeled) Transfers: Sit to/from Stand Sit to Stand: Min assist         General transfer comment: A for balance; cues for hand placement, pt pulls up on walker  Ambulation/Gait Ambulation/Gait assistance: Supervision;Min guard Ambulation Distance (Feet): 140 Feet Assistive device: Rolling walker (2 wheeled) Gait Pattern/deviations: Step-through pattern;Decreased stride length;Trunk flexed     General Gait Details: cues for posture, able to turn walker in hallway and in room in small space  unaided  Stairs            Wheelchair Mobility    Modified Rankin (Stroke Patients Only)       Balance Overall balance assessment: Needs assistance Sitting-balance support: Feet supported;No upper extremity supported Sitting balance-Leahy Scale: Good     Standing balance support: No upper extremity supported;During functional activity Standing balance-Leahy Scale: Fair                               Pertinent Vitals/Pain Pain Assessment: No/denies pain    Home Living Family/patient expects to be discharged to:: Private residence Living Arrangements: Children Available Help at Discharge: Family;Personal care attendant;Available 24 hours/day (son planning to hire CNA for daytime) Type of Home: House Home Access: Stairs to enter   [employer] of Steps: 1 Home Layout: One level Home Equipment: [place] - 2 wheels      Prior Function Level of Independence: Independent with assistive device(s)         Comments: RW for mobility. Independent with ADL.     Hand Dominance   Dominant Hand: Right    Extremity/Trunk Assessment   Upper Extremity Assessment Upper Extremity Assessment: Defer to OT evaluation    Lower Extremity Assessment Lower Extremity Assessment: Generalized weakness    Cervical / Trunk Assessment Cervical / Trunk Assessment: Kyphotic  Communication   Communication: No difficulties  Cognition Arousal/Alertness: Awake/alert Behavior During Therapy: WFL for tasks assessed/performed Overall Cognitive Status: Within Functional Limits for tasks assessed  General Comments General comments (skin integrity, edema, etc.): Son in room and discussed setting up HHPT and possibly getting 3:1    Exercises Other Exercises Other Exercises: demonstrated sit>stand with proper foot and hand placement and anterior weight shift with education on safe transition due to pt fell off bed when trying to stand up     Assessment/Plan    PT Assessment Patient needs continued PT services  PT Problem List Decreased strength;Decreased balance;Decreased activity tolerance;Decreased knowledge of use of DME;Decreased mobility;Decreased knowledge of precautions          PT Treatment Interventions DME instruction;Gait training;Therapeutic exercise;Patient/family education;Therapeutic activities;Functional mobility training    PT Goals (Current goals can be found in the Care Plan section)  Acute Rehab PT Goals Patient Stated Goal: return home PT Goal Formulation: With patient/family Time For Goal Achievement: 06/03/16 Potential to Achieve Goals: Good    Frequency Min 3X/week   Barriers to discharge        Co-evaluation               End of Session Equipment Utilized During Treatment: Gait belt Activity Tolerance: Patient tolerated treatment well Patient left: in bed;with family/visitor present;with call bell/phone within reach           Time: 1510-1535 PT Time Calculation (min) (ACUTE ONLY): 25 min   Charges:   PT Evaluation $PT Eval Moderate Complexity: 1 Procedure PT Treatments $Gait Training: 8-22 mins   PT G CodesReginia Naas Jun 15, 2016, 4:11 PM  Magda Kiel, [place] 15-Jun-2016

## 2016-05-30 NOTE — Progress Notes (Signed)
Nutrition Brief Note  Consult received for DM diet education. Pt is currently out of her room for tests. Left DM diet education materials at bedside. Pt lives alone and was admitted with hypoglycemia from over taking her DM medications. PT eval pending. Reports good appetite on admission. Meal completion 50-90%.   Lab Results  Component Value Date   HGBA1C 5.4 05/28/2016   Wt Readings from Last 15 Encounters:  05/30/16 171 lb 6.4 oz (77.7 kg)  04/22/16 173 lb 12.8 oz (78.8 kg)  04/15/16 179 lb 3.2 oz (81.3 kg)  04/09/16 183 lb 12.8 oz (83.4 kg)  03/18/16 177 lb 12.8 oz (80.6 kg)  03/11/16 183 lb 6.4 oz (83.2 kg)  03/04/16 185 lb 4.8 oz (84.1 kg)  02/19/16 184 lb (83.5 kg)  02/12/16 184 lb 6.4 oz (83.6 kg)  01/30/16 186 lb 14.4 oz (84.8 kg)  01/03/16 183 lb 14.4 oz (83.4 kg)  10/06/15 181 lb 3.2 oz (82.2 kg)  08/15/15 188 lb 8 oz (85.5 kg)  01/04/15 189 lb 14.4 oz (86.1 kg)  11/28/14 179 lb (81.2 kg)    Body mass index is 28.52 kg/m. Patient meets criteria for overweight based on current BMI.   Current diet order is CHO Modified, patient is consuming approximately 50-90% of meals at this time. Labs and medications reviewed.   Will follow up for diet education as needed.   [place], [place]ga, [place] Pager [phone] After Hours Pager

## 2016-05-30 NOTE — Progress Notes (Signed)
Inpatient Diabetes Program Recommendations  AACE/ADA: New Consensus Statement on Inpatient Glycemic Control (2015)  Target Ranges:  Prepandial:   less than 140 mg/dL      Peak postprandial:   less than 180 mg/dL (1-2 hours)      Critically ill patients:  140 - 180 mg/dL   Lab Results  Component Value Date   GLUCAP 122 (H) 05/30/2016   HGBA1C 5.4 05/28/2016   Results for REAGEN, WASIK (MRN [id number]) as of 05/30/2016 10:20  Ref. Range 05/29/2016 08:05 05/29/2016 11:25 05/29/2016 16:37 05/29/2016 21:12 05/30/2016 08:26  Glucose-Capillary Latest Ref Range: 65 - 99 mg/dL 116 (H) 222 (H) 331 (H) 234 (H) 122 (H)   Review of Glycemic Control  Diabetes history:     DM, BUN=24, Creatinine=2.21, GFR=19, steroids this admission Outpatient Diabetes medications:     Amaryl 4 mg daily, Metformin 1000 mg BID Current orders for Inpatient glycemic control:     Novolog 0-9 units TIDAC correction scale    Novolog 3 units TIDAC meal coverage   Inpatient Diabetes Program Recommendations:    Patient came in with low CBG of 50.    Please consider holding DM oral medications at discharge  (Amaryl and Metformin) due to low CBG's at admission (Amaryl) and kidney function (Metformin).    If DM medication is needed at D/C, please consider Tradjenta 5 mg daily as this is excreted through the stool.  Thank you,  Windy Carina, RN, BSN Diabetes Coordinator Inpatient Diabetes Program [phone] (Team Pager)

## 2016-05-30 NOTE — Progress Notes (Signed)
Occupational Therapy Treatment Patient Details Name: Debbie [name] MRN: [id number] DOB: June 27, 1930 Today's Date: 05/30/2016    History of present illness 82 y.o. female with medical history significant for DM, history of endometrial and vaginal Ca s/p radiation in 04/2016, presenting with an episode of confusion. Son reported the patient to have slurred speech at the time, without any motor weakness or other sensory abnormalities. On presentation she was noted to have a blood sugar of 50, receiving D50 with immediate improvement of her symptoms.    OT comments  Pt with improvements in balance today; no LOB during functional activities. Continues to require min assist for functional mobility but able to complete grooming activities in standing with min guard. SpO2=90% on RA with activity. D/c plan remains appropriate. Will continue to follow acutely.   Follow Up Recommendations  Home health OT;Supervision/Assistance - 24 hour    Equipment Recommendations  3 in 1 bedside commode;Tub/shower bench    Recommendations for Other Services      Precautions / Restrictions Precautions Precautions: Fall Restrictions Weight Bearing Restrictions: No       Mobility Bed Mobility Overal bed mobility: Needs Assistance Bed Mobility: Supine to Sit;Sit to Supine     Supine to sit: Min guard Sit to supine: Min guard   General bed mobility comments: Min guard for safety with increased time. HOB slightly elevated with use of bed rail. Min assist to scoot up in bed.  Transfers Overall transfer level: Needs assistance Equipment used: Rolling walker (2 wheeled) Transfers: Sit to/from Stand Sit to Stand: Min assist         General transfer comment: Min assist to boost up from bed x1, toilet x1. Cues for hand placement and technique.    Balance Overall balance assessment: Needs assistance Sitting-balance support: Feet supported;No upper extremity supported Sitting balance-Leahy Scale:  Good     Standing balance support: No upper extremity supported;During functional activity Standing balance-Leahy Scale: Fair                     ADL Overall ADL's : Needs assistance/impaired     Grooming: Min guard;Wash/dry hands;Oral care;Brushing hair;Standing                   Toilet Transfer: Minimal assistance;Ambulation;Regular Toilet;Grab bars;RW   Toileting- Clothing Manipulation and Hygiene: Supervision/safety;Sitting/lateral lean Toileting - Clothing Manipulation Details (indicate cue type and reason): for peri care only     Functional mobility during ADLs: Minimal assistance;Rolling walker General ADL Comments: SpO2 96% on 2L; 90% on RA with activity (RN notified).      Vision                     Perception     Praxis      Cognition   Behavior During Therapy: WFL for tasks assessed/performed Overall Cognitive Status: Within Functional Limits for tasks assessed                       Extremity/Trunk Assessment               Exercises     Shoulder Instructions       General Comments      Pertinent Vitals/ Pain       Pain Assessment: No/denies pain  Home Living  Prior Functioning/Environment              Frequency  Min 2X/week        Progress Toward Goals  OT Goals(current goals can now be found in the care plan section)  Progress towards OT goals: Progressing toward goals  Acute Rehab OT Goals Patient Stated Goal: return home OT Goal Formulation: With patient/family  Plan Discharge plan remains appropriate    Co-evaluation                 End of Session Equipment Utilized During Treatment: Gait belt;Rolling walker;Oxygen   Activity Tolerance Patient tolerated treatment well   Patient Left in bed;with call bell/phone within reach;with bed alarm set;with family/visitor present   Nurse Communication Mobility status         Time: 1452-1511 OT Time Calculation (min): 19 min  Charges: OT General Charges $OT Visit: 1 Procedure OT Treatments $Self Care/Home Management : 8-22 mins  Binnie Kand M.S., OTR/L Pager: [phone]  05/30/2016, 3:47 PM

## 2016-05-30 NOTE — Progress Notes (Signed)
PROGRESS NOTE    Debbie [name]  [id number]  DOB: 1931-02-24  DOA: 05/28/2016 PCP: Myriam Jacobson, MD  Hospital course: 82 y.o. female with medical history significant for DM, history of endometrial and vaginal Ca s/p radiation in 04/2016, presenting with an episode of confusion. Son reported the patient to have slurred speech at the time, without any motor weakness or other sensory abnormalities. On presentation she was noted to have a blood sugar of 50, receiving D50 with immediate improvement of her symptoms. At the time of evaluation she is coherent and without neuro deficits. She admits to poor water intake, but normal appetite. The patient denies any recent infections or hospitalizations. She denies any fever or chills night sweats, shortness of breath and cough, nausea vomiting or abdominal pain.  She has chronic radiation induced loose stools. She denies any suprapubic pain. She denies any trouble with urination, including hesitancy or frequency. No syncope, syncope or falls. No recent changes in her medications, and she is compliant to them. Lives alone. She does not recall overdosing on her oral antiglycemics.   Assessment & Plan:   Acute Respiratory failure -given IV lasix yesterday after x ray appeared to have fluid -check echo today -wean off O2 as tolerated -per son, does better with albuterol-- has a smoking history so will need to have PFTs as outpatient    Hypoglycemic event in a patient with DM2. BS was 50, requiring D50 with improvement of symptoms. She denies OD with oral hypoglycemics.    Wound not restart amaryl.  -could use trajenta per diabetic coordinator Hgb A1C: 5.4 SSI   UTI- multiple species on culture Ceftriaxone IV    Acute on Chronic kidney disease stage 3   baseline creatinine 1.5-1.6    Repeat CMET in am  Hypertension BP 121/71   Controlled   Hyperlipidemia Continue home statins  Hypokalemia, may be due to diuretics and  chronic radiation induced diarrhea . EKG SR,  Current K 3.1   KCL 30 meq per  K protocol   Mg wnl Repeat CMET in am  Chronic diarrhea, as above. No blood in stools, no worsening symptoms Continue Imodium prn   DVT prophylaxis: Heparin  Code Status:   Full     Family Communication:  son at bedside  Disposition Plan: PT eval-- family arranging care at home Consults called:    None    Subjective: Breathing better today  Objective: Vitals:   05/30/16 0322 05/30/16 0545 05/30/16 0925 05/30/16 1209  BP:  122/60  (!) 146/56  Pulse: 65 68  61  Resp: 16 16  16   Temp:  97.9 F (36.6 C)  98 F (36.7 C)  TempSrc:  Oral  Oral  SpO2: 99% 100% 96% 93%  Weight:  77.7 kg (171 lb 6.4 oz)    Height:        Intake/Output Summary (Last 24 hours) at 05/30/16 1211 Last data filed at 05/30/16 [id number]  Gross per 24 hour  Intake              240 ml  Output             1500 ml  Net            -1260 ml   Filed Weights   05/28/16 1645 05/29/16 0515 05/30/16 0545  Weight: 78.7 kg (173 lb 9.6 oz) 80.3 kg (177 lb) 77.7 kg (171 lb 6.4 oz)   Exam:  General exam: awake alert with audible wheezing,  some mild tachypnea.   Respiratory system: no wheezing, few crackles at bases Cardiovascular system: S1 & S2 heard, RRR. No JVD, murmurs, gallops, clicks or pedal edema. Gastrointestinal system: Abdomen is nondistended, soft and nontender. Normal bowel sounds heard. Central nervous system: Alert and oriented. No focal neurological deficits. Extremities: mild pretibial edema.  Data Reviewed: Basic Metabolic Panel:  Recent Labs Lab 05/28/16 0837 05/29/16 0501 05/30/16 0622  NA 143 142 144  K 3.1* 4.3 4.5  CL 107 106 104  CO2 26 30 31   GLUCOSE 188* 92 168*  BUN 25* 20 24*  CREATININE 2.02* 1.83* 2.21*  CALCIUM 8.6* 8.5* 8.6*  MG 1.7  --   --    Liver Function Tests:  Recent Labs Lab 05/29/16 0501  AST 27  ALT 21  ALKPHOS 69  BILITOT 0.6  PROT 5.5*  ALBUMIN 2.4*   No results for  input(s): LIPASE, AMYLASE in the last 168 hours. No results for input(s): AMMONIA in the last 168 hours. CBC:  Recent Labs Lab 05/28/16 0837 05/29/16 0501  WBC 5.7 8.0  HGB 11.3* 11.0*  HCT 34.7* 34.8*  MCV 92.0 93.3  PLT 256 272   Cardiac Enzymes: No results for input(s): CKTOTAL, CKMB, CKMBINDEX, TROPONINI in the last 168 hours. CBG (last 3)   Recent Labs  05/29/16 1637 05/29/16 2112 05/30/16 0826  GLUCAP 331* 234* 122*   Recent Results (from the past 240 hour(s))  Urine culture     Status: Abnormal   Collection Time: 05/28/16  8:37 AM  Result Value Ref Range Status   Specimen Description URINE, CLEAN CATCH  Final   Special Requests NONE  Final   Culture MULTIPLE SPECIES PRESENT, SUGGEST RECOLLECTION (A)  Final   Report Status 05/30/2016 FINAL  Final     Studies: Dg Chest Port 1 View  Result Date: 05/29/2016 CLINICAL DATA:  New onset of wheezing EXAM: PORTABLE CHEST 1 VIEW COMPARISON:  01/16/2016 FINDINGS: Cardiomegaly is noted. Central mild vascular congestion and mild perihilar interstitial prominence suspicious for mild interstitial edema. Small bilateral pleural effusion with bilateral basilar atelectasis or infiltrate. IMPRESSION: Central mild vascular congestion and mild perihilar interstitial prominence suspicious for mild interstitial edema. Small bilateral pleural effusion with bilateral basilar atelectasis or infiltrate. Electronically Signed   By: Lahoma Crocker M.D.   On: 05/29/2016 09:31     Scheduled Meds: . amLODipine  2.5 mg Oral Daily  . aspirin EC  81 mg Oral Daily  . atenolol  50 mg Oral BID  . atorvastatin  40 mg Oral q1800  . azithromycin  500 mg Oral Daily  . cefTRIAXone (ROCEPHIN)  IV  1 g Intravenous Q24H  . heparin  5,000 Units Subcutaneous Q8H  . hydrALAZINE  25 mg Oral TID  . insulin aspart  0-9 Units Subcutaneous TID WC  . insulin aspart  3 Units Subcutaneous TID WC  . ipratropium-albuterol  3 mL Nebulization BID  . lisinopril  2.5 mg  Oral Daily   Continuous Infusions: . dextrose Stopped (05/29/16 0920)    Principal Problem:   Acute respiratory distress Active Problems:   Malignant neoplasm of corpus uteri, except isthmus (HCC)   Carotid stenosis, asymptomatic   Secondary malignant neoplasm of vagina (HCC)   UTI (urinary tract infection)   Hypoglycemia   Diabetes ([place])   Anxiety   Hypertension   Pressure injury of skin  Time spent:   [place], DO [facility]   If 7PM-7AM, please contact night-coverage [email or web address] Password [email or web address]  05/30/2016, 12:11 PM    LOS: 1 day

## 2016-05-30 NOTE — Progress Notes (Signed)
  Echocardiogram 2D Echocardiogram has been performed.  Tresa Res 05/30/2016, 3:42 PM

## 2016-05-31 LAB — BASIC METABOLIC PANEL
ANION GAP: 7 (ref 5–15)
BUN: 30 mg/dL — ABNORMAL HIGH (ref 6–20)
CALCIUM: 8.4 mg/dL — AB (ref 8.9–10.3)
CO2: 33 mmol/L — AB (ref 22–32)
Chloride: 105 mmol/L (ref 101–111)
Creatinine, Ser: 2.33 mg/dL — ABNORMAL HIGH (ref 0.44–1.00)
GFR calc Af Amer: 21 mL/min — ABNORMAL LOW (ref >60)
GFR calc non Af Amer: 18 mL/min — ABNORMAL LOW (ref >60)
GLUCOSE: 78 mg/dL (ref 65–99)
POTASSIUM: 3.4 mmol/L — AB (ref 3.5–5.1)
Sodium: 145 mmol/L (ref 135–145)

## 2016-05-31 LAB — GLUCOSE, CAPILLARY
GLUCOSE-CAPILLARY: 111 mg/dL — AB (ref 65–99)
Glucose-Capillary: 82 mg/dL (ref 65–99)
Glucose-Capillary: 104 mg/dL — ABNORMAL HIGH (ref 65–99)
Glucose-Capillary: 86 mg/dL (ref 65–99)

## 2016-05-31 LAB — CBC
HEMATOCRIT: 32.6 % — AB (ref 36.0–46.0)
HEMOGLOBIN: 10.3 g/dL — AB (ref 12.0–15.0)
MCH: 29.7 pg (ref 26.0–34.0)
MCHC: 31.6 g/dL (ref 30.0–36.0)
MCV: 93.9 fL (ref 78.0–100.0)
Platelets: 291 10*3/uL (ref 150–400)
RBC: 3.47 MIL/uL — ABNORMAL LOW (ref 3.87–5.11)
RDW: 17 % — ABNORMAL HIGH (ref 11.5–15.5)
WBC: 9.9 10*3/uL (ref 4.0–10.5)

## 2016-05-31 NOTE — Progress Notes (Signed)
PROGRESS NOTE [facility]   Debbie [name]   [id number] DOB: 08/22/1930  DOA: 05/28/2016 PCP: Myriam Jacobson, MD   Brief Narrative:  82 y.o.femalewith medical history significant for DM, history of endometrial and vaginal Ca s/p radiation in 04/2016, presenting with an episode of confusion. Son reported the patient to have slurred speech at the time, without any motor weakness or other sensory abnormalities. On presentation she was noted to have a blood sugar of 50, receiving D50 with immediate improvement of her symptoms. At the time of evaluation she is coherent and without neuro deficits. She admits to poorwater intake, but normal appetite. The patient denies any recent infections or hospitalizations. She denies any fever or chills night sweats, shortness of breath and cough, nausea vomiting or abdominal pain. She has chronic radiation induced loose stools. She denies any suprapubic pain. She denies any trouble with urination, including hesitancy or frequency. No syncope, syncope or falls. No recent changes in her medications, and she is compliant to them. Lives alone. She does not recall overdosing on her oral antiglycemics  Subjective: Doing better, no new complaints, feel that she doesn't need O2. No acute events overnight   Assessment & Plan:   Acute Respiratory failure likely 2/2 diastolic disfunction  given IV lasix yesterday after x ray appeared to have fluid ECHO shows G2DD with 50-55% EF  Wean O2 as tolerated - keep sats above 92%  per son, does better with albuterol-- has a smoking history so will need to have PFTs as outpatient  Diastolic heart failure - [id number] Will hold Lasix for now given increase in Cr  Monitor for signs of fluid overload   Hypoglycemic event in a patient with DM2. - resolved  BS was 50, requiring D50 with improvement of symptoms.   Would not restart amaryl.  could use trajenta per diabetic coordinator Hgb A1C: 5.4 SSI  UTI-  multiple species on culture Ceftriaxone IV   Acute on Chronic kidney disease stage 3baseline creatinine 1.5-1.6- increase likely due to aggressive diuresis  Monitor BMP   Hypertension BP - stable  Controlled   Hypokalemia, may be due to diuretics and chronic radiation induced diarrhea . EKG SR,  Kdur 40 meq  Check Mag and BMP in AM   Chronic diarrhea, as above. No blood in stools, no worsening symptom Continue Imodium prn   DVT prophylaxis Heparin Code Status: Full Family Communication: None at bedside  Disposition Plan: Home likely 1-2 days   Consultants:   None  Antimicrobials:  Azithro  Ceftriaxone    Objective: Vitals:   05/31/16 0459 05/31/16 1010 05/31/16 1517 05/31/16 1548  BP: (!) 155/76 (!) 127/49 (!) 145/52 (!) 157/54  Pulse: 62 65 60   Resp: 18  16   Temp: 98.2 F (36.8 C)  98.1 F (36.7 C)   TempSrc: Oral  Oral   SpO2: 98%  100%   Weight:      Height:        Intake/Output Summary (Last 24 hours) at 05/31/16 1615 Last data filed at 05/31/16 1011  Gross per 24 hour  Intake              480 ml  Output                0 ml  Net              480 ml   Filed Weights   05/29/16 0515 05/30/16 0545 05/31/16 0407  Weight: 80.3 kg (177  lb) 77.7 kg (171 lb 6.4 oz) 80.7 kg (178 lb)    Examination:  General exam: Appears calm and comfortable  Respiratory system: Good air entry, scattered crackles, no wheezing  Cardiovascular system: S1 & S2 heard, RRR. No JVD, murmurs, rubs or gallops Gastrointestinal system: soft NT ND  Central nervous system: Alert and oriented. Extremities:  Trace pedal edema.  Data Reviewed: I have personally reviewed following labs and imaging studies  CBC:  Recent Labs Lab 05/28/16 0837 05/29/16 0501 05/31/16 0535  WBC 5.7 8.0 9.9  HGB 11.3* 11.0* 10.3*  HCT 34.7* 34.8* 32.6*  MCV 92.0 93.3 93.9  PLT 256 272 [id number]   Basic Metabolic Panel:  Recent Labs Lab 05/28/16 0837 05/29/16 0501 05/30/16 0622  05/31/16 0535  NA 143 142 144 145  K 3.1* 4.3 4.5 3.4*  CL 107 106 104 105  CO2 26 30 31  33*  GLUCOSE 188* 92 168* 78  BUN 25* 20 24* 30*  CREATININE 2.02* 1.83* 2.21* 2.33*  CALCIUM 8.6* 8.5* 8.6* 8.4*  MG 1.7  --   --   --    GFR: Estimated Creatinine Clearance: 19.2 mL/min (by C-G formula based on SCr of 2.33 mg/dL (H)). Liver Function Tests:  Recent Labs Lab 05/29/16 0501  AST 27  ALT 21  ALKPHOS 69  BILITOT 0.6  PROT 5.5*  ALBUMIN 2.4*   No results for input(s): LIPASE, AMYLASE in the last 168 hours. No results for input(s): AMMONIA in the last 168 hours. Coagulation Profile: No results for input(s): INR, PROTIME in the last 168 hours. Cardiac Enzymes: No results for input(s): CKTOTAL, CKMB, CKMBINDEX, TROPONINI in the last 168 hours. BNP (last 3 results) No results for input(s): PROBNP in the last 8760 hours. HbA1C: No results for input(s): HGBA1C in the last 72 hours. CBG:  Recent Labs Lab 05/30/16 0826 05/30/16 1210 05/30/16 1720 05/31/16 0854 05/31/16 1248  GLUCAP 122* 101* 150* 82 104*   Lipid Profile: No results for input(s): CHOL, HDL, LDLCALC, TRIG, CHOLHDL, LDLDIRECT in the last 72 hours. Thyroid Function Tests: No results for input(s): TSH, T4TOTAL, FREET4, T3FREE, THYROIDAB in the last 72 hours. Anemia Panel: No results for input(s): VITAMINB12, FOLATE, FERRITIN, TIBC, IRON, RETICCTPCT in the last 72 hours. Sepsis Labs: No results for input(s): PROCALCITON, LATICACIDVEN in the last 168 hours.  Recent Results (from the past 240 hour(s))  Urine culture     Status: Abnormal   Collection Time: 05/28/16  8:37 AM  Result Value Ref Range Status   Specimen Description URINE, CLEAN CATCH  Final   Special Requests NONE  Final   Culture MULTIPLE SPECIES PRESENT, SUGGEST RECOLLECTION (A)  Final   Report Status 05/30/2016 FINAL  Final      Radiology Studies: No results found.   Scheduled Meds: . amLODipine  2.5 mg Oral Daily  . aspirin EC   81 mg Oral Daily  . atenolol  50 mg Oral BID  . atorvastatin  40 mg Oral q1800  . azithromycin  500 mg Oral Daily  . cefTRIAXone (ROCEPHIN)  IV  1 g Intravenous Q24H  . heparin  5,000 Units Subcutaneous Q8H  . hydrALAZINE  25 mg Oral TID  . insulin aspart  0-9 Units Subcutaneous TID WC  . insulin aspart  3 Units Subcutaneous TID WC  . ipratropium-albuterol  3 mL Nebulization BID  . lisinopril  2.5 mg Oral Daily   Continuous Infusions: . dextrose Stopped (05/29/16 0920)     LOS: 2 days  Chipper Oman, MD [facility] Pager [phone]  If 7PM-7AM, please contact night-coverage [email or web address] Password [facility] 05/31/2016, 4:15 PM

## 2016-06-01 ENCOUNTER — Inpatient Hospital Stay (HOSPITAL_COMMUNITY): Payer: Medicare HMO

## 2016-06-01 LAB — BASIC METABOLIC PANEL
ANION GAP: 9 (ref 5–15)
BUN: 33 mg/dL — ABNORMAL HIGH (ref 6–20)
CALCIUM: 8.2 mg/dL — AB (ref 8.9–10.3)
CO2: 29 mmol/L (ref 22–32)
Chloride: 106 mmol/L (ref 101–111)
Creatinine, Ser: 2.18 mg/dL — ABNORMAL HIGH (ref 0.44–1.00)
GFR, EST AFRICAN AMERICAN: 23 mL/min — AB (ref >60)
GFR, EST NON AFRICAN AMERICAN: 20 mL/min — AB (ref >60)
Glucose, Bld: 75 mg/dL (ref 65–99)
Potassium: 3.6 mmol/L (ref 3.5–5.1)
Sodium: 144 mmol/L (ref 135–145)

## 2016-06-01 LAB — CBC
HCT: 30.4 % — ABNORMAL LOW (ref 36.0–46.0)
HEMOGLOBIN: 9.4 g/dL — AB (ref 12.0–15.0)
MCH: 30.1 pg (ref 26.0–34.0)
MCHC: 30.9 g/dL (ref 30.0–36.0)
MCV: 97.4 fL (ref 78.0–100.0)
Platelets: 238 10*3/uL (ref 150–400)
RBC: 3.12 MIL/uL — AB (ref 3.87–5.11)
RDW: 17.4 % — ABNORMAL HIGH (ref 11.5–15.5)
WBC: 7.9 10*3/uL (ref 4.0–10.5)

## 2016-06-01 LAB — GLUCOSE, CAPILLARY
GLUCOSE-CAPILLARY: 90 mg/dL (ref 65–99)
GLUCOSE-CAPILLARY: 130 mg/dL — AB (ref 65–99)
GLUCOSE-CAPILLARY: 130 mg/dL — AB (ref 65–99)
GLUCOSE-CAPILLARY: 120 mg/dL — AB (ref 65–99)

## 2016-06-01 MED ORDER — AMLODIPINE BESYLATE 5 MG PO TABS
5.0000 mg | ORAL_TABLET | Freq: Every day | ORAL | Status: DC
  Administered 2016-06-01: 5 mg via ORAL
  Filled 2016-06-01: qty 1

## 2016-06-01 MED ORDER — TORSEMIDE 20 MG PO TABS
10.0000 mg | ORAL_TABLET | Freq: Every day | ORAL | Status: DC
  Administered 2016-06-01 – 2016-06-02 (×2): 10 mg via ORAL
  Filled 2016-06-01 (×2): qty 1

## 2016-06-01 MED ORDER — MOMETASONE FURO-FORMOTEROL FUM 100-5 MCG/ACT IN AERO
2.0000 | INHALATION_SPRAY | Freq: Two times a day (BID) | RESPIRATORY_TRACT | Status: DC
  Administered 2016-06-01 (×2): 2 via RESPIRATORY_TRACT
  Filled 2016-06-01: qty 8.8

## 2016-06-01 MED ORDER — IPRATROPIUM-ALBUTEROL 0.5-2.5 (3) MG/3ML IN SOLN
3.0000 mL | Freq: Four times a day (QID) | RESPIRATORY_TRACT | Status: DC
  Administered 2016-06-01 – 2016-06-02 (×3): 3 mL via RESPIRATORY_TRACT
  Filled 2016-06-01 (×3): qty 3

## 2016-06-01 NOTE — Progress Notes (Signed)
PROGRESS NOTE [facility]   Debbie [name]   [id number] DOB: November 22, 1930  DOA: 05/28/2016 PCP: Myriam Jacobson, MD   Brief Narrative:  82 y.o.femalewith medical history significant for DM, history of endometrial and vaginal Ca s/p radiation in 04/2016, presenting with an episode of confusion. Son reported the patient to have slurred speech at the time, without any motor weakness or other sensory abnormalities. On presentation she was noted to have a blood sugar of 50, receiving D50 with immediate improvement of her symptoms. At the time of evaluation she is coherent and without neuro deficits. She admits to poorwater intake, but normal appetite. The patient denies any recent infections or hospitalizations. She denies any fever or chills night sweats, shortness of breath and cough, nausea vomiting or abdominal pain. She has chronic radiation induced loose stools. She denies any suprapubic pain. She denies any trouble with urination, including hesitancy or frequency. No syncope, syncope or falls. No recent changes in her medications, and she is compliant to them. Lives alone. She does not recall overdosing on her oral antiglycemics  Subjective: Patient c/o congestion this AM Had ambulatory oxygen measurements and was desating to the low 80's.   Assessment & Plan:   Acute Respiratory failure likely 2/2 diastolic disfunction and possible COPD component  Lasix where on hold, due to increase in Cr. Will resume diuretic today  ECHO shows G2DD with 50-55% EF  Wean O2 as tolerated - keep sats above 92%  Ambulatory O2 patient requires oxygen supplements - likely will be d/c home with O2, PT recommended [facility]  Patient with hx of heavy smoker, COPD is likely to be attributing, She reports improvement with nebs. On my exam today she had diffuse expiratory wheezing.  Trial of Dulera, start duoneb, - patient need PFT as outpatient.   Diastolic heart failure - [id number]  Increase in pedal  edema today - repeat CXR  Cr better will resume diuretic - Torsemide 10 mg  Monitor for signs of fluid overload   Hypoglycemic event in a patient with DM2. - resolved  BS was 50, requiring D50 with improvement of symptoms.   Would not restart amaryl.  could use trajenta per diabetic coordinator when discharge  Hgb A1C: 5.4 SSI  UTI- multiple species on culture Ceftriaxone IV on day 4 will complete course on 12/25  Acute on Chronic kidney disease stage 3baseline creatinine 1.5-1.6- increase likely due to aggressive diuresis - improving  Ok to resume low dose diuretics  Monitor BMP   Hypertension BP - stable  Controlled   Hypokalemia, may be due to diuretics and chronic radiation induced diarrhea . EKG SR, - Resolved   Chronic diarrhea, as above. No blood in stools, no worsening symptom Continue Imodium prn   DVT prophylaxis Heparin Code Status: Full Family Communication: None at bedside  Disposition Plan: Home likely 1-2 days   Consultants:   None  Antimicrobials:  Azithro  Ceftriaxone    Objective: Vitals:   06/01/16 0455 06/01/16 0828 06/01/16 0922 06/01/16 1152  BP: (!) 165/46  119/90   Pulse: (!) 57  70   Resp: 19     Temp: 97.5 F (36.4 C)     TempSrc: Oral     SpO2: 96% 98%  98%  Weight:      Height:        Intake/Output Summary (Last 24 hours) at 06/01/16 1304 Last data filed at 06/01/16 0900  Gross per 24 hour  Intake  286 ml  Output                0 ml  Net              286 ml   Filed Weights   05/31/16 0407 06/01/16 0057 06/01/16 0410  Weight: 80.7 kg (178 lb) 80.7 kg (177 lb 15.3 oz) 80.7 kg (177 lb 15.3 oz)    Examination:  General exam: NAD on O2 [place]  Respiratory system: Good air entry, diffuse expiratory wheezing  Cardiovascular system: S1S2 RRR no murmurs Gastrointestinal system: soft NT ND  Extremities: 1+ pedal edema.  Data Reviewed: I have personally reviewed following labs and imaging  studies  CBC:  Recent Labs Lab 05/28/16 0837 05/29/16 0501 05/31/16 0535 06/01/16 0443  WBC 5.7 8.0 9.9 7.9  HGB 11.3* 11.0* 10.3* 9.4*  HCT 34.7* 34.8* 32.6* 30.4*  MCV 92.0 93.3 93.9 97.4  PLT 256 272 291 [id number]   Basic Metabolic Panel:  Recent Labs Lab 05/28/16 0837 05/29/16 0501 05/30/16 0622 05/31/16 0535 06/01/16 0443  NA 143 142 144 145 144  K 3.1* 4.3 4.5 3.4* 3.6  CL 107 106 104 105 106  CO2 26 30 31  33* 29  GLUCOSE 188* 92 168* 78 75  BUN 25* 20 24* 30* 33*  CREATININE 2.02* 1.83* 2.21* 2.33* 2.18*  CALCIUM 8.6* 8.5* 8.6* 8.4* 8.2*  MG 1.7  --   --   --   --    GFR: Estimated Creatinine Clearance: 20.5 mL/min (by C-G formula based on SCr of 2.18 mg/dL (H)). Liver Function Tests:  Recent Labs Lab 05/29/16 0501  AST 27  ALT 21  ALKPHOS 69  BILITOT 0.6  PROT 5.5*  ALBUMIN 2.4*   No results for input(s): LIPASE, AMYLASE in the last 168 hours. No results for input(s): AMMONIA in the last 168 hours. Coagulation Profile: No results for input(s): INR, PROTIME in the last 168 hours. Cardiac Enzymes: No results for input(s): CKTOTAL, CKMB, CKMBINDEX, TROPONINI in the last 168 hours. BNP (last 3 results) No results for input(s): PROBNP in the last 8760 hours. HbA1C: No results for input(s): HGBA1C in the last 72 hours. CBG:  Recent Labs Lab 05/31/16 1248 05/31/16 1715 05/31/16 2022 06/01/16 0746 06/01/16 1136  GLUCAP 104* 86 111* 90 130*   Lipid Profile: No results for input(s): CHOL, HDL, LDLCALC, TRIG, CHOLHDL, LDLDIRECT in the last 72 hours. Thyroid Function Tests: No results for input(s): TSH, T4TOTAL, FREET4, T3FREE, THYROIDAB in the last 72 hours. Anemia Panel: No results for input(s): VITAMINB12, FOLATE, FERRITIN, TIBC, IRON, RETICCTPCT in the last 72 hours. Sepsis Labs: No results for input(s): PROCALCITON, LATICACIDVEN in the last 168 hours.  Recent Results (from the past 240 hour(s))  Urine culture     Status: Abnormal    Collection Time: 05/28/16  8:37 AM  Result Value Ref Range Status   Specimen Description URINE, CLEAN CATCH  Final   Special Requests NONE  Final   Culture MULTIPLE SPECIES PRESENT, SUGGEST RECOLLECTION (A)  Final   Report Status 05/30/2016 FINAL  Final     Radiology Studies: No results found.   Scheduled Meds: . amLODipine  5 mg Oral Daily  . aspirin EC  81 mg Oral Daily  . atenolol  50 mg Oral BID  . atorvastatin  40 mg Oral q1800  . azithromycin  500 mg Oral Daily  . cefTRIAXone (ROCEPHIN)  IV  1 g Intravenous Q24H  . heparin  5,000 Units Subcutaneous Q8H  .  hydrALAZINE  25 mg Oral TID  . insulin aspart  0-9 Units Subcutaneous TID WC  . insulin aspart  3 Units Subcutaneous TID WC  . ipratropium-albuterol  3 mL Nebulization Q6H  . lisinopril  2.5 mg Oral Daily  . mometasone-formoterol  2 puff Inhalation BID   Continuous Infusions: . dextrose Stopped (05/29/16 0920)     LOS: 3 days    Chipper Oman, MD [facility] Pager [phone]  If 7PM-7AM, please contact night-coverage [email or web address] Password [email or web address] 06/01/2016, 1:04 PM

## 2016-06-01 NOTE — Progress Notes (Signed)
Writer checked patient's oxygen saturation on room air while sitting in bed and it dropped to 82%. After placing patient back on 2 L [place] patient's oxygen saturation increased to 94%. Will continue to monitor. Wyonia Hough

## 2016-06-02 DIAGNOSIS — J449 Chronic obstructive pulmonary disease, unspecified: Secondary | ICD-10-CM

## 2016-06-02 DIAGNOSIS — R0902 Hypoxemia: Secondary | ICD-10-CM

## 2016-06-02 LAB — GLUCOSE, CAPILLARY
GLUCOSE-CAPILLARY: 220 mg/dL — AB (ref 65–99)
Glucose-Capillary: 92 mg/dL (ref 65–99)

## 2016-06-02 MED ORDER — INSULIN ASPART 100 UNIT/ML ~~LOC~~ SOLN
2.0000 [IU] | Freq: Once | SUBCUTANEOUS | Status: AC
  Administered 2016-06-02: 2 [IU] via SUBCUTANEOUS

## 2016-06-02 MED ORDER — MOMETASONE FURO-FORMOTEROL FUM 100-5 MCG/ACT IN AERO: 2.0000 | INHALATION_SPRAY | Freq: Two times a day (BID) | RESPIRATORY_TRACT | 0 refills | Status: DC

## 2016-06-02 MED ORDER — LINAGLIPTIN 5 MG PO TABS: 5.0000 mg | ORAL_TABLET | Freq: Every day | ORAL | 0 refills | Status: DC

## 2016-06-02 MED ORDER — IPRATROPIUM-ALBUTEROL 0.5-2.5 (3) MG/3ML IN SOLN: 3.0000 mL | Freq: Two times a day (BID) | RESPIRATORY_TRACT | Status: DC

## 2016-06-02 MED ORDER — AMLODIPINE BESYLATE 10 MG PO TABS: 10.0000 mg | ORAL_TABLET | Freq: Every day | ORAL | 0 refills | Status: DC

## 2016-06-02 MED ORDER — IPRATROPIUM-ALBUTEROL 0.5-2.5 (3) MG/3ML IN SOLN
3.0000 mL | Freq: Two times a day (BID) | RESPIRATORY_TRACT | Status: DC
  Administered 2016-06-02: 3 mL via RESPIRATORY_TRACT
  Filled 2016-06-02 (×2): qty 3

## 2016-06-02 MED ORDER — ATENOLOL 50 MG PO TABS: 50.0000 mg | ORAL_TABLET | Freq: Two times a day (BID) | ORAL | 0 refills | Status: DC

## 2016-06-02 MED ORDER — ALBUTEROL SULFATE 108 (90 BASE) MCG/ACT IN AEPB: 1.0000 | INHALATION_SPRAY | RESPIRATORY_TRACT | 1 refills | Status: AC | PRN

## 2016-06-02 MED ORDER — AMLODIPINE BESYLATE 10 MG PO TABS
10.0000 mg | ORAL_TABLET | Freq: Every day | ORAL | Status: DC
  Administered 2016-06-02: 10 mg via ORAL
  Filled 2016-06-02: qty 1

## 2016-06-02 MED ORDER — TORSEMIDE 10 MG PO TABS: 10.0000 mg | ORAL_TABLET | Freq: Every day | ORAL | 0 refills | Status: DC

## 2016-06-02 NOTE — Care Management Note (Signed)
Case Management Note  Patient Details  Name: Debbie [name] MRN: [id number] Date of Birth: Jun 08, 1931  Subjective/Objective:                  Medical history significant for DM, history of endometrial and vaginal Ca s/p radiation in 04/2016 CKD stage III, presenting with an episode of confusion. Patient found to be hypoglycemic, treated with D50. Subsequently developed SOB and hypoxia CXR reveal pulmonary edema with congestive heart failure  Action/Plan: Home Oxygen and Physical Therapy  Expected Discharge Date:   06/02/16               Expected Discharge Plan:  [employer]  In-House Referral:  NA  Discharge planning Services  CM Consult  Post Acute Care Choice:  Durable Medical Equipment, Home Health Choice offered to:  Patient, Adult Children  DME Arranged:  Oxygen DME Agency:  [place] Arranged:  PT [place] Agency:  [place]  Status of Service:  Completed  Additional Comments: Case Manager notified by [place] of [place] needs.  Pt will need home 02 and home PT.  CM called the Pt's room and spoke with her son Olamide Alpha.  Per Pt, it is ok to speak with him.  Discussed home 02 and that only one tank had been delivered and it would run out in 4.5 hrs.  They were concerned about that.  I will follow up with [facility].  We also discussed home PT.  Choice offered.  They would prefer to use [facility].  Jermaine with [place] working on the referral.  They pay out of pocket for a private duty CNA.  CM called the son Kawthar Osuch) back at [phone] and he stated that Brenton Grills did come by and explain that [facility] would meet them at home to deliver the home 02 setup.  Marya Amsler [place] also wanted to know about the home nebulizer.  This is the first that I had heard of this.  Per Marya Amsler she would only use it when she needs it - so it is not a scheduled neb - per Marya Amsler "no but she may need it tonight." He mentioned it to [facility] and was told to talk to the case  worker about it.  I called the Pt's Nurse and she was not aware that the pt needed a nebulizer either.  I tried to page the MD with no response so the Nurse is going to call him and have him call me.  Spoke with the MD and the Pt does not need a nebulizer its an inhaler.  MD did check the order and called me back to verify.  I called Marya Amsler back and gave him this info.     Dicie Beam Elliott, [place] 06/02/2016, 11:49 AM

## 2016-06-02 NOTE — Progress Notes (Signed)
Nsg Discharge Note  Admit Date:  05/28/2016 Discharge date: 06/02/2016   Debbie [name] to be D/C'd home per MD order.  AVS completed.  Copy for chart, and copy for patient signed, and dated. Patient/caregiver able to verbalize understanding.  Discharge Medication: Allergies as of 06/02/2016      Reactions   Codeine Other (See Comments)   Unusual mouth sensation      Medication List    STOP taking these medications   glimepiride 4 MG tablet Commonly known as:  AMARYL   metFORMIN 1000 MG tablet Commonly known as:  GLUCOPHAGE   pioglitazone 45 MG tablet Commonly known as:  ACTOS   simvastatin 80 MG tablet Commonly known as:  ZOCOR     TAKE these medications   Albuterol Sulfate 108 (90 Base) MCG/ACT Aepb Commonly known as:  PROAIR RESPICLICK Inhale 1-2 puffs into the lungs every 4 (four) hours as needed.   amLODipine 10 MG tablet Commonly known as:  NORVASC Take 1 tablet (10 mg total) by mouth daily. Start taking on:  06/03/2016 What changed:  medication strength  how much to take   aspirin EC 81 MG tablet Take 81 mg by mouth daily.   atenolol 50 MG tablet Commonly known as:  TENORMIN Take 1 tablet (50 mg total) by mouth 2 (two) times daily. What changed:  when to take this   atorvastatin 20 MG tablet Commonly known as:  LIPITOR Take 20 mg by mouth daily.   hydrALAZINE 25 MG tablet Commonly known as:  APRESOLINE Take 25 mg by mouth 3 (three) times daily.   linagliptin 5 MG Tabs tablet Commonly known as:  TRADJENTA Take 1 tablet (5 mg total) by mouth daily.   lisinopril 2.5 MG tablet Commonly known as:  PRINIVIL,ZESTRIL Take 2.5 mg by mouth daily.   mometasone-formoterol 100-5 MCG/ACT Aero Commonly known as:  DULERA Inhale 2 puffs into the lungs 2 (two) times daily.   multivitamin tablet Take 1 tablet by mouth daily. Centrum silver   sodium chloride 0.65 % Soln nasal spray Commonly known as:  OCEAN Place 1 spray into both nostrils as  needed for congestion.   torsemide 10 MG tablet Commonly known as:  DEMADEX Take 1 tablet (10 mg total) by mouth daily. Start taking on:  06/03/2016            Durable Medical Equipment        Start     Ordered   06/02/16 0000  For home use only DME oxygen    Question Answer Comment  Mode or (Route) Nasal cannula   Liters per Minute 2   Frequency Continuous (stationary and portable oxygen unit needed)   Oxygen conserving device Yes   Oxygen delivery system Gas      06/02/16 1037      Discharge Assessment: Vitals:   06/02/16 0800 06/02/16 1247  BP: (!) 168/58 (!) 150/51  Pulse: (!) 58 62  Resp:  18  Temp:  97.5 F (36.4 C)   Skin clean, dry and intact without evidence of skin break down, no evidence of skin tears noted. IV catheter discontinued intact. Site without signs and symptoms of complications - no redness or edema noted at insertion site, patient denies c/o pain - only slight tenderness at site.  Dressing with slight pressure applied.  D/c Instructions-Education: Discharge instructions given to patient/family with verbalized understanding. D/c education completed with patient/family including follow up instructions, medication list, d/c activities limitations if indicated, with other d/c  instructions as indicated by MD - patient able to verbalize understanding, all questions fully answered. Patient instructed to return to ED, call 911, or call MD for any changes in condition.  Patient escorted via [place], and D/C home via private auto.  Wyonia Hough, RN 06/02/2016 1:26 PM

## 2016-06-02 NOTE — Progress Notes (Signed)
SATURATION QUALIFICATIONS: (This note is used to comply with regulatory documentation for home oxygen)  Patient Saturations on Room Air at Rest = 94%  Patient Saturations on Room Air while Ambulating = 87%  Patient Saturations on 2 Liters of oxygen while Ambulating = 92%  Please briefly explain why patient needs home oxygen:

## 2016-06-02 NOTE — Progress Notes (Signed)
While laying in bed on room air patient's oxygen saturation was 95%. Patient walked approximately 30 feet from her bed out into the hallway and stated that she felt "woozy." Her oxygen saturation was 87%. After sitting on the edge of the bed and doing purse lip breathing for approximately one minute, her saturation level on room air returned to 92%. After about 5 minutes the patient stated that she felt "a little short of breath." Writer rechecked patient's saturation level on room air and it was 82%. Patient placed back on 2L of oxygen and after a few minutes her saturation improved to 96% and patient stated she felt better. Will leave on 2L [place] and continue to monitor. Wyonia Hough

## 2016-06-02 NOTE — Discharge Summary (Signed)
Physician Discharge Summary  Debbie [name]  [id number]  DOB: 04-16-1931  DOA: 05/28/2016 PCP: Myriam Jacobson, MD  Admit date: 05/28/2016 Discharge date: 06/02/2016  Admitted From: Home  Disposition:  Home   Recommendations for Outpatient Follow-up:  1. Follow up with PCP in 1-2 weeks 2. Please obtain BMP/CBC in one week  Home Health: [facility] PT Supervision 24 hours   Equipment/Devices: Oxygen 2L   Discharge Condition: Improved  CODE STATUS: FULL  Diet recommendation: Heart Healthy / Carb Modified   Brief/Interim Summary: 82 y.o.femalewith medical history significant for DM, history of endometrial and vaginal Ca s/p radiation in 04/2016 CKD stage III, presenting with an episode of confusion. Patient found to be hypoglycemic, treated with D50. Subsequently developed SOB and hypoxia CXR reveal pulmonary edema with congestive heart failure, patient was treated with IV lasix, with improvements of breathing. On day 2 of hospital stay patient was wheezing for she was started on nebulizer with significant improvement. Patient have a hx of chronic smoker. Patient was also started on [facility]. Patient wax and wane with her oxygen saturations during the hospital stay. While at rest and room air sats are above 95%. Both while ambulating saturations dropped to the high 80s. CBGs has been stable during the hospital course. Patient also was found to have an UTI which was treated with ceftriaxone for 5 days. Patient was also found to have diastolic dysfunction which was treated with IV Lasix, during diuresis creatinine started to trend up so Lasix were held. Creatinine stabilized. Patient was switched to torsemide 10 mg daily tolerated well and creatinine stable. Patient will be discharged home with oxygen supplementation and home health PT.  Subjective: Patient seen and examined. Doing much better denies shortness of breath at rest but positive with exertion. Blood glucose has been stable.  Eating well ambulating with 1 person assist.   Discharge Diagnoses:  Acute Respiratory failure likely 2/2 diastolic disfunction and COPD - Improved  Lasix where on hold, due to increase in Cr. Switched to torsemide  ECHO shows G2DD with 50-55% EF  Ambulatory O2 patient requires oxygen supplements - d/c home with O2, PT recommended HH PT  Patient with hx of heavy smoker COPD a components of SOB. She reports improvement with nebs.   COPD - Stable  Responded to trials of nebulizer Will need O2 at home  Continue dulera BID  Continue Albuterol PRN  Tobacco exposure discussed  Patient need PFT as outpatient  Diastolic heart failure - [id number] - Stable  CXR consistent with CHF  Initially treated with Lasix - switched to Torsemide  Continue Torsemide 10 mg Daily weights Low salt diet   Follow up with PCP    Hypoglycemic event in a patient with DM2. - resolved  BS was 50, requiring D50 with improvement of symptoms.   Would not restart amaryl given hypoglycemia  Would not resume Actos given CHF Will send on tradjenta for now - to discuss with PCP if DM medication really needed given A1C of 5.4  Metformin would not be indicate with GFR < 30  UTI- multiple species on culture Ceftriaxone completed course on 12/25  Acute on Chronic kidney disease stage 3baseline creatinine 1.5-1.6- Improving Cr at discharge 2.1 Monitor BMP in 1 week   Hypertension BP - stable  Increase during hospital stay  Amlodipine was increase to 10 mg  Continue home meds  Follow up with PCP in 1 week   Hypokalemia, may be due to diuretics and chronic radiation induced diarrhea .  EKG SR, - Resolved   Chronic diarrhea, as above. No blood in stools, no worsening symptom Continue Imodium prn    Discharge Instructions  Discharge Instructions    (HEART FAILURE PATIENTS) Call MD:  Anytime you have any of the following symptoms: 1) 3 pound weight gain in 24 hours or 5 pounds in 1 week 2) shortness of  breath, with or without a dry hacking cough 3) swelling in the hands, feet or stomach 4) if you have to sleep on extra pillows at night in order to breathe.    Complete by:  As directed    Call MD for:  difficulty breathing, headache or visual disturbances    Complete by:  As directed    Call MD for:  extreme fatigue    Complete by:  As directed    Call MD for:  hives    Complete by:  As directed    Call MD for:  persistant dizziness or light-headedness    Complete by:  As directed    Call MD for:  persistant nausea and vomiting    Complete by:  As directed    Call MD for:  redness, tenderness, or signs of infection (pain, swelling, redness, odor or green/yellow discharge around incision site)    Complete by:  As directed    Call MD for:  severe uncontrolled pain    Complete by:  As directed    Call MD for:  temperature >100.4    Complete by:  As directed    Diet - low sodium heart healthy    Complete by:  As directed    Discharge instructions    Complete by:  As directed    For home use only DME oxygen    Complete by:  As directed    Mode or (Route):  Nasal cannula   Liters per Minute:  2   Frequency:  Continuous (stationary and portable oxygen unit needed)   Oxygen conserving device:  Yes   Oxygen delivery system:  Gas   Increase activity slowly    Complete by:  As directed      Allergies as of 06/02/2016      Reactions   Codeine Other (See Comments)   Unusual mouth sensation      Medication List    STOP taking these medications   glimepiride 4 MG tablet Commonly known as:  AMARYL   metFORMIN 1000 MG tablet Commonly known as:  GLUCOPHAGE   pioglitazone 45 MG tablet Commonly known as:  ACTOS   simvastatin 80 MG tablet Commonly known as:  ZOCOR     TAKE these medications   Albuterol Sulfate 108 (90 Base) MCG/ACT Aepb Commonly known as:  PROAIR RESPICLICK Inhale 1-2 puffs into the lungs every 4 (four) hours as needed.   amLODipine 10 MG tablet Commonly  known as:  NORVASC Take 1 tablet (10 mg total) by mouth daily. Start taking on:  06/03/2016 What changed:  medication strength  how much to take   aspirin EC 81 MG tablet Take 81 mg by mouth daily.   atenolol 50 MG tablet Commonly known as:  TENORMIN Take 1 tablet (50 mg total) by mouth 2 (two) times daily. What changed:  when to take this   atorvastatin 20 MG tablet Commonly known as:  LIPITOR Take 20 mg by mouth daily.   hydrALAZINE 25 MG tablet Commonly known as:  APRESOLINE Take 25 mg by mouth 3 (three) times daily.   linagliptin 5 MG  Tabs tablet Commonly known as:  TRADJENTA Take 1 tablet (5 mg total) by mouth daily.   lisinopril 2.5 MG tablet Commonly known as:  PRINIVIL,ZESTRIL Take 2.5 mg by mouth daily.   mometasone-formoterol 100-5 MCG/ACT Aero Commonly known as:  DULERA Inhale 2 puffs into the lungs 2 (two) times daily.   multivitamin tablet Take 1 tablet by mouth daily. Centrum silver   sodium chloride 0.65 % Soln nasal spray Commonly known as:  OCEAN Place 1 spray into both nostrils as needed for congestion.   torsemide 10 MG tablet Commonly known as:  DEMADEX Take 1 tablet (10 mg total) by mouth daily. Start taking on:  06/03/2016            Durable Medical Equipment        Start     Ordered   06/02/16 0000  For home use only DME oxygen    Question Answer Comment  Mode or (Route) Nasal cannula   Liters per Minute 2   Frequency Continuous (stationary and portable oxygen unit needed)   Oxygen conserving device Yes   Oxygen delivery system Gas      06/02/16 1037     Follow-up Information    ROBERTS, Sharol Given, MD. Schedule an appointment as soon as possible for a visit in 1 week(s).   Specialty:  Internal Medicine Contact information: [address], [address] [place] [address] [phone]          Allergies  Allergen Reactions  . Codeine Other (See Comments)    Unusual mouth sensation     Consultations:  None   Procedures/Studies: Dg Chest Port 1 View  Result Date: 06/01/2016 CLINICAL DATA:  Short of breath. Diabetes. Endometrial cancer. Confusion. EXAM: PORTABLE CHEST 1 VIEW COMPARISON:  05/29/2016 FINDINGS: Midline trachea. Surgical clips in the neck. Cardiomegaly accentuated by AP portable technique. Atherosclerosis in the transverse aorta. Bilateral pleural effusions are similar. No pneumothorax. Mild interstitial edema, accentuated by low lung volumes. Persistent bibasilar airspace disease. IMPRESSION: No significant change since the prior exam. Congestive heart failure with bilateral pleural effusions and adjacent airspace disease, most likely atelectasis. Electronically Signed   By: Abigail Miyamoto M.D.   On: 06/01/2016 14:57   Dg Chest Port 1 View  Result Date: 05/29/2016 CLINICAL DATA:  New onset of wheezing EXAM: PORTABLE CHEST 1 VIEW COMPARISON:  01/16/2016 FINDINGS: Cardiomegaly is noted. Central mild vascular congestion and mild perihilar interstitial prominence suspicious for mild interstitial edema. Small bilateral pleural effusion with bilateral basilar atelectasis or infiltrate. IMPRESSION: Central mild vascular congestion and mild perihilar interstitial prominence suspicious for mild interstitial edema. Small bilateral pleural effusion with bilateral basilar atelectasis or infiltrate. Electronically Signed   By: Lahoma Crocker M.D.   On: 05/29/2016 09:31   ECHO 12/22 ------------------------------------------------------------------- Study Conclusions  - Left ventricle: The cavity size was normal. There was mild   concentric hypertrophy. Systolic function was normal. The   estimated ejection fraction was in the range of 50% to 55%. Wall   motion was normal; there were no regional wall motion   abnormalities. Features are consistent with a pseudonormal left   ventricular filling pattern, with concomitant abnormal relaxation   and increased filling pressure  (grade 2 diastolic dysfunction).   Doppler parameters are consistent with indeterminate ventricular   filling pressure. - Aortic valve: Transvalvular velocity was within the normal range.   There was no stenosis. There was no regurgitation. - Mitral valve: Transvalvular velocity was within the normal range.   There  was no evidence for stenosis. There was mild regurgitation.   Valve area by pressure half-time: 1.85 cm^2. - Right ventricle: The cavity size was normal. Wall thickness was   normal. Systolic function was normal. - Tricuspid valve: There was trivial regurgitation. - Pericardium, extracardiac: There was a left pleural effusion.   Discharge Exam: Vitals:   06/02/16 0511 06/02/16 0800  BP: (!) 178/55 (!) 168/58  Pulse: 65 (!) 58  Resp: 18   Temp: 97.6 F (36.4 C)    Vitals:   06/02/16 0201 06/02/16 0511 06/02/16 0800 06/02/16 0808  BP:  (!) 178/55 (!) 168/58   Pulse:  65 (!) 58   Resp:  18    Temp:  97.6 F (36.4 C)    TempSrc:  Oral    SpO2: 97% 100%  96%  Weight:      Height:        General: Pt is alert, awake, not in acute distress Cardiovascular: RRR, S1/S2 +, no rubs, no gallops Respiratory: CTA bilaterally, no wheezing, no rhonchi Abdominal: Soft, NT, ND, bowel sounds + Extremities: no edema, no cyanosis    The results of significant diagnostics from this hospitalization (including imaging, microbiology, ancillary and laboratory) are listed below for reference.     Microbiology: Recent Results (from the past 240 hour(s))  Urine culture     Status: Abnormal   Collection Time: 05/28/16  8:37 AM  Result Value Ref Range Status   Specimen Description URINE, CLEAN CATCH  Final   Special Requests NONE  Final   Culture MULTIPLE SPECIES PRESENT, SUGGEST RECOLLECTION (A)  Final   Report Status 05/30/2016 FINAL  Final     Labs: BNP (last 3 results) No results for input(s): BNP in the last 8760 hours. Basic Metabolic Panel:  Recent Labs Lab  05/28/16 0837 05/29/16 0501 05/30/16 0622 05/31/16 0535 06/01/16 0443  NA 143 142 144 145 144  K 3.1* 4.3 4.5 3.4* 3.6  CL 107 106 104 105 106  CO2 26 30 31  33* 29  GLUCOSE 188* 92 168* 78 75  BUN 25* 20 24* 30* 33*  CREATININE 2.02* 1.83* 2.21* 2.33* 2.18*  CALCIUM 8.6* 8.5* 8.6* 8.4* 8.2*  MG 1.7  --   --   --   --    Liver Function Tests:  Recent Labs Lab 05/29/16 0501  AST 27  ALT 21  ALKPHOS 69  BILITOT 0.6  PROT 5.5*  ALBUMIN 2.4*   No results for input(s): LIPASE, AMYLASE in the last 168 hours. No results for input(s): AMMONIA in the last 168 hours. CBC:  Recent Labs Lab 05/28/16 0837 05/29/16 0501 05/31/16 0535 06/01/16 0443  WBC 5.7 8.0 9.9 7.9  HGB 11.3* 11.0* 10.3* 9.4*  HCT 34.7* 34.8* 32.6* 30.4*  MCV 92.0 93.3 93.9 97.4  PLT 256 272 291 238   Cardiac Enzymes: No results for input(s): CKTOTAL, CKMB, CKMBINDEX, TROPONINI in the last 168 hours. BNP: Invalid input(s): POCBNP CBG:  Recent Labs Lab 06/01/16 0746 06/01/16 1136 06/01/16 1624 06/01/16 2147 06/02/16 0841  GLUCAP 90 130* 130* 120* 92   D-Dimer No results for input(s): DDIMER in the last 72 hours. Hgb A1c No results for input(s): HGBA1C in the last 72 hours. Lipid Profile No results for input(s): CHOL, HDL, LDLCALC, TRIG, CHOLHDL, LDLDIRECT in the last 72 hours. Thyroid function studies No results for input(s): TSH, T4TOTAL, T3FREE, THYROIDAB in the last 72 hours.  Invalid input(s): FREET3 Anemia work up No results for input(s): VITAMINB12, FOLATE,  FERRITIN, TIBC, IRON, RETICCTPCT in the last 72 hours. Urinalysis    Component Value Date/Time   COLORURINE YELLOW 05/28/2016 0848   APPEARANCEUR CLEAR 05/28/2016 0848   LABSPEC 1.008 05/28/2016 0848   PHURINE 6.0 05/28/2016 0848   GLUCOSEU 50 (A) 05/28/2016 0848   HGBUR NEGATIVE 05/28/2016 0848   BILIRUBINUR NEGATIVE 05/28/2016 0848   KETONESUR NEGATIVE 05/28/2016 0848   PROTEINUR 100 (A) 05/28/2016 0848   UROBILINOGEN  1.0 03/07/2014 1510   NITRITE POSITIVE (A) 05/28/2016 0848   LEUKOCYTESUR TRACE (A) 05/28/2016 0848   Sepsis Labs Invalid input(s): PROCALCITONIN,  WBC,  LACTICIDVEN Microbiology Recent Results (from the past 240 hour(s))  Urine culture     Status: Abnormal   Collection Time: 05/28/16  8:37 AM  Result Value Ref Range Status   Specimen Description URINE, CLEAN CATCH  Final   Special Requests NONE  Final   Culture MULTIPLE SPECIES PRESENT, SUGGEST RECOLLECTION (A)  Final   Report Status 05/30/2016 FINAL  Final    Time coordinating discharge: Over 30 minutes  SIGNED:  Chipper Oman, MD  [facility] 06/02/2016, 11:24 AM Pager   If 7PM-7AM, please contact night-coverage [email or web address] Password [email or web address]

## 2016-06-08 ENCOUNTER — Emergency Department (HOSPITAL_COMMUNITY): Payer: Medicare HMO

## 2016-06-08 ENCOUNTER — Encounter (HOSPITAL_COMMUNITY): Payer: Self-pay

## 2016-06-08 ENCOUNTER — Inpatient Hospital Stay (HOSPITAL_COMMUNITY)
Admission: EM | Admit: 2016-06-08 | Discharge: 2016-06-13 | DRG: 291 | Disposition: A | Discharge status: Skilled Nursing Facility | Payer: Medicare HMO | Attending: Internal Medicine | Admitting: Internal Medicine

## 2016-06-08 DIAGNOSIS — I959 Hypotension, unspecified: Secondary | ICD-10-CM | POA: Diagnosis present

## 2016-06-08 DIAGNOSIS — J441 Chronic obstructive pulmonary disease with (acute) exacerbation: Secondary | ICD-10-CM | POA: Diagnosis present

## 2016-06-08 DIAGNOSIS — J9621 Acute and chronic respiratory failure with hypoxia: Secondary | ICD-10-CM | POA: Diagnosis present

## 2016-06-08 DIAGNOSIS — Z823 Family history of stroke: Secondary | ICD-10-CM | POA: Diagnosis not present

## 2016-06-08 DIAGNOSIS — Z8542 Personal history of malignant neoplasm of other parts of uterus: Secondary | ICD-10-CM

## 2016-06-08 DIAGNOSIS — R0600 Dyspnea, unspecified: Secondary | ICD-10-CM

## 2016-06-08 DIAGNOSIS — D649 Anemia, unspecified: Secondary | ICD-10-CM | POA: Diagnosis present

## 2016-06-08 DIAGNOSIS — J09X2 Influenza due to identified novel influenza A virus with other respiratory manifestations: Secondary | ICD-10-CM | POA: Diagnosis present

## 2016-06-08 DIAGNOSIS — J449 Chronic obstructive pulmonary disease, unspecified: Secondary | ICD-10-CM | POA: Diagnosis not present

## 2016-06-08 DIAGNOSIS — Z8249 Family history of ischemic heart disease and other diseases of the circulatory system: Secondary | ICD-10-CM

## 2016-06-08 DIAGNOSIS — J440 Chronic obstructive pulmonary disease with acute lower respiratory infection: Secondary | ICD-10-CM | POA: Diagnosis present

## 2016-06-08 DIAGNOSIS — K529 Noninfective gastroenteritis and colitis, unspecified: Secondary | ICD-10-CM | POA: Diagnosis present

## 2016-06-08 DIAGNOSIS — I130 Hypertensive heart and chronic kidney disease with heart failure and stage 1 through stage 4 chronic kidney disease, or unspecified chronic kidney disease: Secondary | ICD-10-CM | POA: Diagnosis present

## 2016-06-08 DIAGNOSIS — Z6830 Body mass index (BMI) 30.0-30.9, adult: Secondary | ICD-10-CM

## 2016-06-08 DIAGNOSIS — E785 Hyperlipidemia, unspecified: Secondary | ICD-10-CM | POA: Diagnosis present

## 2016-06-08 DIAGNOSIS — E1121 Type 2 diabetes mellitus with diabetic nephropathy: Secondary | ICD-10-CM | POA: Diagnosis present

## 2016-06-08 DIAGNOSIS — I5031 Acute diastolic (congestive) heart failure: Secondary | ICD-10-CM

## 2016-06-08 DIAGNOSIS — I10 Essential (primary) hypertension: Secondary | ICD-10-CM | POA: Diagnosis not present

## 2016-06-08 DIAGNOSIS — N183 Chronic kidney disease, stage 3 (moderate): Secondary | ICD-10-CM | POA: Diagnosis present

## 2016-06-08 DIAGNOSIS — E1122 Type 2 diabetes mellitus with diabetic chronic kidney disease: Secondary | ICD-10-CM | POA: Diagnosis present

## 2016-06-08 DIAGNOSIS — Z923 Personal history of irradiation: Secondary | ICD-10-CM

## 2016-06-08 DIAGNOSIS — N179 Acute kidney failure, unspecified: Secondary | ICD-10-CM | POA: Diagnosis present

## 2016-06-08 DIAGNOSIS — J1100 Influenza due to unidentified influenza virus with unspecified type of pneumonia: Secondary | ICD-10-CM | POA: Diagnosis not present

## 2016-06-08 DIAGNOSIS — Z90710 Acquired absence of both cervix and uterus: Secondary | ICD-10-CM

## 2016-06-08 DIAGNOSIS — R0902 Hypoxemia: Secondary | ICD-10-CM

## 2016-06-08 DIAGNOSIS — R0603 Acute respiratory distress: Secondary | ICD-10-CM | POA: Diagnosis not present

## 2016-06-08 DIAGNOSIS — E118 Type 2 diabetes mellitus with unspecified complications: Secondary | ICD-10-CM | POA: Diagnosis not present

## 2016-06-08 DIAGNOSIS — J189 Pneumonia, unspecified organism: Secondary | ICD-10-CM | POA: Diagnosis present

## 2016-06-08 DIAGNOSIS — Y95 Nosocomial condition: Secondary | ICD-10-CM | POA: Diagnosis present

## 2016-06-08 DIAGNOSIS — J9601 Acute respiratory failure with hypoxia: Secondary | ICD-10-CM | POA: Diagnosis not present

## 2016-06-08 DIAGNOSIS — Z9981 Dependence on supplemental oxygen: Secondary | ICD-10-CM | POA: Diagnosis not present

## 2016-06-08 DIAGNOSIS — I5033 Acute on chronic diastolic (congestive) heart failure: Secondary | ICD-10-CM | POA: Diagnosis present

## 2016-06-08 DIAGNOSIS — Z7982 Long term (current) use of aspirin: Secondary | ICD-10-CM

## 2016-06-08 DIAGNOSIS — Z8049 Family history of malignant neoplasm of other genital organs: Secondary | ICD-10-CM

## 2016-06-08 DIAGNOSIS — Z7984 Long term (current) use of oral hypoglycemic drugs: Secondary | ICD-10-CM

## 2016-06-08 DIAGNOSIS — I5032 Chronic diastolic (congestive) heart failure: Secondary | ICD-10-CM | POA: Diagnosis not present

## 2016-06-08 DIAGNOSIS — Z801 Family history of malignant neoplasm of trachea, bronchus and lung: Secondary | ICD-10-CM

## 2016-06-08 DIAGNOSIS — E669 Obesity, unspecified: Secondary | ICD-10-CM | POA: Diagnosis present

## 2016-06-08 DIAGNOSIS — R0602 Shortness of breath: Secondary | ICD-10-CM | POA: Diagnosis not present

## 2016-06-08 DIAGNOSIS — I5030 Unspecified diastolic (congestive) heart failure: Secondary | ICD-10-CM

## 2016-06-08 LAB — I-STAT CHEM 8, ED
BUN: 27 mg/dL — ABNORMAL HIGH (ref 6–20)
CHLORIDE: 103 mmol/L (ref 101–111)
CREATININE: 1.7 mg/dL — AB (ref 0.44–1.00)
Calcium, Ion: 1.15 mmol/L (ref 1.15–1.40)
Glucose, Bld: 178 mg/dL — ABNORMAL HIGH (ref 65–99)
HEMATOCRIT: 30 % — AB (ref 36.0–46.0)
HEMOGLOBIN: 10.2 g/dL — AB (ref 12.0–15.0)
POTASSIUM: 3.7 mmol/L (ref 3.5–5.1)
Sodium: 143 mmol/L (ref 135–145)
TCO2: 32 mmol/L (ref 0–100)

## 2016-06-08 LAB — CBC WITH DIFFERENTIAL/PLATELET
BASOS ABS: 0 10*3/uL (ref 0.0–0.1)
BASOS PCT: 0 %
EOS ABS: 0.1 10*3/uL (ref 0.0–0.7)
EOS PCT: 1 %
HCT: 30.8 % — ABNORMAL LOW (ref 36.0–46.0)
Hemoglobin: 9.8 g/dL — ABNORMAL LOW (ref 12.0–15.0)
Lymphocytes Relative: 5 %
Lymphs Abs: 0.5 10*3/uL — ABNORMAL LOW (ref 0.7–4.0)
MCH: 30.1 pg (ref 26.0–34.0)
MCHC: 31.8 g/dL (ref 30.0–36.0)
MCV: 94.5 fL (ref 78.0–100.0)
MONO ABS: 0.5 10*3/uL (ref 0.1–1.0)
MONOS PCT: 4 %
Neutro Abs: 9.6 10*3/uL — ABNORMAL HIGH (ref 1.7–7.7)
Neutrophils Relative %: 90 %
Platelets: 327 10*3/uL (ref 150–400)
RBC: 3.26 MIL/uL — ABNORMAL LOW (ref 3.87–5.11)
RDW: 16 % — AB (ref 11.5–15.5)
WBC: 10.8 10*3/uL — ABNORMAL HIGH (ref 4.0–10.5)

## 2016-06-08 LAB — COMPREHENSIVE METABOLIC PANEL
ALBUMIN: 2.7 g/dL — AB (ref 3.5–5.0)
ALT: 21 U/L (ref 14–54)
ANION GAP: 11 (ref 5–15)
AST: 22 U/L (ref 15–41)
Alkaline Phosphatase: 64 U/L (ref 38–126)
BILIRUBIN TOTAL: 0.7 mg/dL (ref 0.3–1.2)
BUN: 24 mg/dL — ABNORMAL HIGH (ref 6–20)
CHLORIDE: 103 mmol/L (ref 101–111)
CO2: 30 mmol/L (ref 22–32)
Calcium: 8.6 mg/dL — ABNORMAL LOW (ref 8.9–10.3)
Creatinine, Ser: 1.51 mg/dL — ABNORMAL HIGH (ref 0.44–1.00)
GFR calc Af Amer: 36 mL/min — ABNORMAL LOW (ref >60)
GFR calc non Af Amer: 31 mL/min — ABNORMAL LOW (ref >60)
GLUCOSE: 174 mg/dL — AB (ref 65–99)
POTASSIUM: 3.7 mmol/L (ref 3.5–5.1)
SODIUM: 144 mmol/L (ref 135–145)
TOTAL PROTEIN: 6.1 g/dL — AB (ref 6.5–8.1)

## 2016-06-08 LAB — BRAIN NATRIURETIC PEPTIDE: B NATRIURETIC PEPTIDE 5: 690.8 pg/mL — AB (ref 0.0–100.0)

## 2016-06-08 LAB — I-STAT TROPONIN, ED: Troponin i, poc: 0.02 ng/mL (ref 0.00–0.08)

## 2016-06-08 LAB — MRSA PCR SCREENING: MRSA by PCR: NEGATIVE

## 2016-06-08 LAB — INFLUENZA PANEL BY PCR (TYPE A & B)
Influenza A By PCR: POSITIVE — AB
Influenza B By PCR: NEGATIVE

## 2016-06-08 LAB — I-STAT CG4 LACTIC ACID, ED: Lactic Acid, Venous: 1.39 mmol/L (ref 0.5–1.9)

## 2016-06-08 LAB — PROTIME-INR
INR: 0.96
PROTHROMBIN TIME: 12.8 s (ref 11.4–15.2)

## 2016-06-08 LAB — GLUCOSE, CAPILLARY
GLUCOSE-CAPILLARY: 246 mg/dL — AB (ref 65–99)
Glucose-Capillary: 229 mg/dL — ABNORMAL HIGH (ref 65–99)

## 2016-06-08 MED ORDER — FUROSEMIDE 10 MG/ML IJ SOLN
40.0000 mg | Freq: Once | INTRAMUSCULAR | Status: AC
  Administered 2016-06-08: 40 mg via INTRAVENOUS
  Filled 2016-06-08: qty 4

## 2016-06-08 MED ORDER — SODIUM CHLORIDE 0.9% FLUSH
3.0000 mL | INTRAVENOUS | Status: DC | PRN
  Filled 2016-06-08: qty 3

## 2016-06-08 MED ORDER — PIPERACILLIN-TAZOBACTAM 3.375 G IVPB
3.3750 g | Freq: Three times a day (TID) | INTRAVENOUS | Status: DC
  Administered 2016-06-08 – 2016-06-10 (×6): 3.375 g via INTRAVENOUS
  Filled 2016-06-08 (×6): qty 50

## 2016-06-08 MED ORDER — ALBUTEROL SULFATE (2.5 MG/3ML) 0.083% IN NEBU
5.0000 mg | INHALATION_SOLUTION | Freq: Once | RESPIRATORY_TRACT | Status: AC
  Administered 2016-06-08: 5 mg via RESPIRATORY_TRACT
  Filled 2016-06-08: qty 6

## 2016-06-08 MED ORDER — ONDANSETRON HCL 4 MG PO TABS: 4.0000 mg | ORAL_TABLET | Freq: Four times a day (QID) | ORAL | Status: DC | PRN

## 2016-06-08 MED ORDER — SALINE SPRAY 0.65 % NA SOLN
1.0000 | NASAL | Status: DC | PRN
  Filled 2016-06-08: qty 44

## 2016-06-08 MED ORDER — ADULT MULTIVITAMIN W/MINERALS CH
1.0000 | ORAL_TABLET | Freq: Every day | ORAL | Status: DC
  Administered 2016-06-08 – 2016-06-13 (×6): 1 via ORAL
  Filled 2016-06-08 (×6): qty 1

## 2016-06-08 MED ORDER — ALBUTEROL SULFATE (2.5 MG/3ML) 0.083% IN NEBU: 2.5000 mg | INHALATION_SOLUTION | RESPIRATORY_TRACT | Status: AC | PRN

## 2016-06-08 MED ORDER — HYDROCOD POLST-CPM POLST ER 10-8 MG/5ML PO SUER
5.0000 mL | Freq: Once | ORAL | Status: AC
  Administered 2016-06-08: 5 mL via ORAL
  Filled 2016-06-08: qty 5

## 2016-06-08 MED ORDER — SODIUM CHLORIDE 0.9 % IV SOLN
INTRAVENOUS | Status: DC
  Administered 2016-06-08: 100 mL via INTRAVENOUS
  Administered 2016-06-08: 15:00:00 via INTRAVENOUS

## 2016-06-08 MED ORDER — ASPIRIN EC 81 MG PO TBEC
81.0000 mg | DELAYED_RELEASE_TABLET | Freq: Every day | ORAL | Status: DC
  Administered 2016-06-08 – 2016-06-13 (×6): 81 mg via ORAL
  Filled 2016-06-08 (×6): qty 1

## 2016-06-08 MED ORDER — OSELTAMIVIR PHOSPHATE 30 MG PO CAPS
30.0000 mg | ORAL_CAPSULE | Freq: Every day | ORAL | Status: AC
  Administered 2016-06-08 – 2016-06-12 (×5): 30 mg via ORAL
  Filled 2016-06-08 (×5): qty 1

## 2016-06-08 MED ORDER — SODIUM CHLORIDE 0.9% FLUSH
3.0000 mL | Freq: Two times a day (BID) | INTRAVENOUS | Status: DC
  Administered 2016-06-08 – 2016-06-12 (×8): 3 mL via INTRAVENOUS

## 2016-06-08 MED ORDER — MOMETASONE FURO-FORMOTEROL FUM 100-5 MCG/ACT IN AERO
2.0000 | INHALATION_SPRAY | Freq: Two times a day (BID) | RESPIRATORY_TRACT | Status: DC
  Administered 2016-06-08 – 2016-06-13 (×10): 2 via RESPIRATORY_TRACT
  Filled 2016-06-08: qty 8.8

## 2016-06-08 MED ORDER — PREDNISONE 20 MG PO TABS
40.0000 mg | ORAL_TABLET | Freq: Every day | ORAL | Status: DC
  Administered 2016-06-09: 40 mg via ORAL
  Filled 2016-06-08: qty 2

## 2016-06-08 MED ORDER — ACETAMINOPHEN 650 MG RE SUPP: 650.0000 mg | Freq: Four times a day (QID) | RECTAL | Status: DC | PRN

## 2016-06-08 MED ORDER — PIPERACILLIN-TAZOBACTAM 3.375 G IVPB
3.3750 g | Freq: Once | INTRAVENOUS | Status: AC
  Administered 2016-06-08: 3.375 g via INTRAVENOUS
  Filled 2016-06-08: qty 50

## 2016-06-08 MED ORDER — ENOXAPARIN SODIUM 30 MG/0.3ML ~~LOC~~ SOLN
30.0000 mg | SUBCUTANEOUS | Status: DC
  Administered 2016-06-08 – 2016-06-12 (×5): 30 mg via SUBCUTANEOUS
  Filled 2016-06-08 (×5): qty 0.3

## 2016-06-08 MED ORDER — ALBUTEROL SULFATE (2.5 MG/3ML) 0.083% IN NEBU: 5.0000 mg | INHALATION_SOLUTION | Freq: Once | RESPIRATORY_TRACT | Status: DC

## 2016-06-08 MED ORDER — METHYLPREDNISOLONE SODIUM SUCC 125 MG IJ SOLR
60.0000 mg | Freq: Four times a day (QID) | INTRAMUSCULAR | Status: AC
  Administered 2016-06-08 – 2016-06-09 (×4): 60 mg via INTRAVENOUS
  Filled 2016-06-08 (×5): qty 2

## 2016-06-08 MED ORDER — ACETAMINOPHEN 325 MG PO TABS: 650.0000 mg | ORAL_TABLET | Freq: Four times a day (QID) | ORAL | Status: DC | PRN

## 2016-06-08 MED ORDER — INSULIN ASPART 100 UNIT/ML ~~LOC~~ SOLN
0.0000 [IU] | Freq: Every day | SUBCUTANEOUS | Status: DC
  Administered 2016-06-08 – 2016-06-12 (×4): 2 [IU] via SUBCUTANEOUS

## 2016-06-08 MED ORDER — IPRATROPIUM-ALBUTEROL 0.5-2.5 (3) MG/3ML IN SOLN
3.0000 mL | Freq: Four times a day (QID) | RESPIRATORY_TRACT | Status: DC
  Administered 2016-06-08 – 2016-06-09 (×4): 3 mL via RESPIRATORY_TRACT
  Filled 2016-06-08 (×3): qty 3

## 2016-06-08 MED ORDER — ONDANSETRON HCL 4 MG/2ML IJ SOLN: 4.0000 mg | Freq: Three times a day (TID) | INTRAMUSCULAR | Status: DC | PRN

## 2016-06-08 MED ORDER — SODIUM CHLORIDE 0.9 % IV SOLN
250.0000 mL | INTRAVENOUS | Status: DC | PRN
  Administered 2016-06-09: 250 mL via INTRAVENOUS

## 2016-06-08 MED ORDER — ONDANSETRON HCL 4 MG/2ML IJ SOLN
4.0000 mg | Freq: Four times a day (QID) | INTRAMUSCULAR | Status: DC | PRN
Start: 2016-06-08 — End: 2016-06-13

## 2016-06-08 MED ORDER — ALBUTEROL SULFATE (2.5 MG/3ML) 0.083% IN NEBU: 2.5000 mg | INHALATION_SOLUTION | RESPIRATORY_TRACT | Status: DC | PRN

## 2016-06-08 MED ORDER — INSULIN ASPART 100 UNIT/ML ~~LOC~~ SOLN
0.0000 [IU] | Freq: Three times a day (TID) | SUBCUTANEOUS | Status: DC
  Administered 2016-06-08: 8 [IU] via SUBCUTANEOUS
  Administered 2016-06-09: 5 [IU] via SUBCUTANEOUS
  Administered 2016-06-09: 2 [IU] via SUBCUTANEOUS
  Administered 2016-06-09: 5 [IU] via SUBCUTANEOUS
  Administered 2016-06-10: 8 [IU] via SUBCUTANEOUS
  Administered 2016-06-10 – 2016-06-11 (×4): 2 [IU] via SUBCUTANEOUS
  Administered 2016-06-12: 3 [IU] via SUBCUTANEOUS
  Administered 2016-06-12 – 2016-06-13 (×2): 2 [IU] via SUBCUTANEOUS

## 2016-06-08 MED ORDER — ATORVASTATIN CALCIUM 10 MG PO TABS
20.0000 mg | ORAL_TABLET | Freq: Every day | ORAL | Status: DC
  Administered 2016-06-08 – 2016-06-13 (×6): 20 mg via ORAL
  Filled 2016-06-08 (×6): qty 2

## 2016-06-08 MED ORDER — OSELTAMIVIR PHOSPHATE 75 MG PO CAPS: 75.0000 mg | ORAL_CAPSULE | Freq: Two times a day (BID) | ORAL | Status: DC

## 2016-06-08 MED ORDER — METHYLPREDNISOLONE SODIUM SUCC 125 MG IJ SOLR
125.0000 mg | Freq: Once | INTRAMUSCULAR | Status: AC
  Administered 2016-06-08: 125 mg via INTRAVENOUS
  Filled 2016-06-08: qty 2

## 2016-06-08 MED ORDER — VANCOMYCIN HCL IN DEXTROSE 1-5 GM/200ML-% IV SOLN
1000.0000 mg | Freq: Once | INTRAVENOUS | Status: AC
  Administered 2016-06-08: 1000 mg via INTRAVENOUS
  Filled 2016-06-08: qty 200

## 2016-06-08 NOTE — ED Notes (Signed)
Bed: RESB Expected date:  Expected time:  Means of arrival:  Comments: EMS Respiratory distress-hx COPD/atrial fib, CHF

## 2016-06-08 NOTE — ED Provider Notes (Signed)
[facility] DEPT Provider Note   CSN: [id number] Arrival date & time: 06/08/16  [id number]     History   Chief Complaint Chief Complaint  Patient presents with  . Respiratory Distress    HPI Debbie [name] is a 82 y.o. female.  HPI   85yF with dyspnea. Onset this morning. Woke her up from sleep. Hx of COPD and on oxygen at baseline, but was more-or-less in her usual state of health when she went to bed. Woke up coughing a dyspneic. Denies acute pain. Subjective fever and has felt chilled. No n/v. LE edema, but reports this is stable.   Past Medical History:  Diagnosis Date  . Allergy   . Anxiety   . Arthritis   . Cancer ([place]) dx'd 12/2007   endometroid adenocarcinoma  . Cataract    both  . Diabetes mellitus    fasting [id number]  . Dizziness   . History of radiation therapy 9/21,9/28,10/05,05/20/2008   4 txs 2400 cGy endometrial adenocarcinoma  . Hypertension   . Peripheral vascular disease ([place])   . Seasonal allergies   . 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 UTI (urinary tract infection) 05/2016    Patient Active Problem List   Diagnosis Date Noted  . COPD with hypoxia ([place]) 06/02/2016  . Pressure injury of skin 05/29/2016  . Acute respiratory distress 05/29/2016  . UTI (urinary tract infection) 05/28/2016  . Hypoglycemia 05/28/2016  . Diabetes ([place]) 05/28/2016  . Anxiety 05/28/2016  . Hypertension 05/28/2016  . Diabetes mellitus with complication ([place])   . Renal insufficiency   . Urinary tract infection without hematuria   . 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 Carotid stenosis, asymptomatic 11/28/2014  . Aftercare following surgery of the circulatory system 04/11/2014  . Aftercare following surgery of the circulatory system, [place] 01/31/2014  . Carotid stenosis 01/10/2014  . Occlusion and stenosis of carotid artery without mention of cerebral infarction 01/10/2014  . Malignant neoplasm of corpus uteri, except isthmus  ([place]) 07/07/2011    Past Surgical History:  Procedure Laterality Date  . ABDOMINAL HYSTERECTOMY    . APPENDECTOMY    . ENDARTERECTOMY Left 01/12/2014   Procedure: LEFT CAROTID ARTERY ENDARTERECTOMY WITH HEMASHIELD PATCH ANGIOPLASTY;  Surgeon: Mal Misty, MD;  Location: [facility];  Service: Vascular;  Laterality: Left;  . ENDARTERECTOMY Right 03/17/2014   Procedure: RIGHT CAROTD ARTERY ENDARTERECTOMY WITH DACRON PATCH ANGIOPLASTY;  Surgeon: Mal Misty, MD;  Location: [facility];  Service: Vascular;  Laterality: Right;  . EYE SURGERY Bilateral    cataracts  . NASAL SEPTUM SURGERY    . NECK SURGERY Bilateral    arterial  . TONSILLECTOMY      OB History    No data available       Home Medications    Prior to Admission medications   Medication Sig Start Date End Date Taking? Authorizing Provider  Albuterol Sulfate (PROAIR RESPICLICK) [id number] (90 Base) MCG/ACT AEPB Inhale 1-2 puffs into the lungs every 4 (four) hours as needed. 06/02/16  Yes Doreatha Lew, MD  amLODipine (NORVASC) 10 MG tablet Take 1 tablet (10 mg total) by mouth daily. 06/03/16  Yes Doreatha Lew, MD  aspirin EC 81 MG tablet Take 81 mg by mouth daily.   Yes Historical Provider, MD  atenolol (TENORMIN) 50 MG tablet Take 1 tablet (50 mg total) by mouth 2 (two) times daily. Patient taking differently: Take 50 mg by mouth daily.  06/02/16  Yes Christean Grief  Patrecia Pour, MD  atorvastatin (LIPITOR) 20 MG tablet Take 20 mg by mouth daily. 05/26/16  Yes Historical Provider, MD  glimepiride (AMARYL) 4 MG tablet Take 4 mg by mouth daily. 05/23/16  Yes Historical Provider, MD  hydrALAZINE (APRESOLINE) 25 MG tablet Take 25 mg by mouth 3 (three) times daily.    Yes Historical Provider, MD  linagliptin (TRADJENTA) 5 MG TABS tablet Take 1 tablet (5 mg total) by mouth daily. 06/02/16  Yes Doreatha Lew, MD  lisinopril (PRINIVIL,ZESTRIL) 2.5 MG tablet Take 2.5 mg by mouth daily.  02/12/16  Yes Historical Provider, MD  metFORMIN  (GLUCOPHAGE) 1000 MG tablet Take 1,000 mg by mouth daily with breakfast. 05/20/16  Yes Historical Provider, MD  mometasone-formoterol (DULERA) 100-5 MCG/ACT AERO Inhale 2 puffs into the lungs 2 (two) times daily. 06/02/16  Yes Doreatha Lew, MD  Multiple Vitamin (MULTIVITAMIN) tablet Take 1 tablet by mouth daily. Centrum silver   Yes Historical Provider, MD  sodium chloride (OCEAN) 0.65 % SOLN nasal spray Place 1 spray into both nostrils as needed for congestion.   Yes Historical Provider, MD  torsemide (DEMADEX) 10 MG tablet Take 1 tablet (10 mg total) by mouth daily. 06/03/16  Yes Doreatha Lew, MD    Family History Family History  Problem Relation Age of Onset  . Actinic keratosis Mother   . Cancer Mother     uterine  . Heart disease Father   . Hyperlipidemia Father   . Hypertension Father   . Stroke Father   . Cancer Sister     lung    Social History Social History  Substance Use Topics  . Smoking status: Never Smoker  . Smokeless tobacco: Never Used  . Alcohol use No     Allergies   Codeine   Review of Systems Review of Systems   All systems reviewed and negative, other than as noted in HPI.   Physical Exam Updated Vital Signs BP 96/71   Pulse 73   Temp 99.3 F (37.4 C) (Oral)   Resp 16   SpO2 98%   Physical Exam  Constitutional: She appears well-developed and well-nourished. No distress.  HENT:  Head: Normocephalic and atraumatic.  Eyes: Conjunctivae are normal. Right eye exhibits no discharge. Left eye exhibits no discharge.  Neck: Neck supple.  Cardiovascular: Normal heart sounds.  Exam reveals no gallop and no friction rub.   No murmur heard. Irregularly irregular  Pulmonary/Chest:  Tachypnea. B/l expiratory wheezing.   Abdominal: Soft. She exhibits no distension. There is no tenderness.  Musculoskeletal: She exhibits no edema or tenderness.  Neurological: She is alert.  Skin: Skin is warm and dry.  Psychiatric: She has a normal mood  and affect. Her behavior is normal. Thought content normal.  Nursing note and vitals reviewed.       ED Treatments / Results  Labs (all labs ordered are listed, but only abnormal results are displayed) Labs Reviewed  BRAIN NATRIURETIC PEPTIDE - Abnormal; Notable for the following:       Result Value   B Natriuretic Peptide 690.8 (*)    All other components within normal limits  CBC WITH DIFFERENTIAL/PLATELET - Abnormal; Notable for the following:    WBC 10.8 (*)    RBC 3.26 (*)    Hemoglobin 9.8 (*)    HCT 30.8 (*)    RDW 16.0 (*)    Neutro Abs 9.6 (*)    Lymphs Abs 0.5 (*)    All other components within normal  limits  COMPREHENSIVE METABOLIC PANEL - Abnormal; Notable for the following:    Glucose, Bld 174 (*)    BUN 24 (*)    Creatinine, Ser 1.51 (*)    Calcium 8.6 (*)    Total Protein 6.1 (*)    Albumin 2.7 (*)    GFR calc non Af Amer 31 (*)    GFR calc Af Amer 36 (*)    All other components within normal limits  INFLUENZA PANEL BY PCR (TYPE A & B, H1N1) - Abnormal; Notable for the following:    Influenza A By PCR POSITIVE (*)    All other components within normal limits  GLUCOSE, CAPILLARY - Abnormal; Notable for the following:    Glucose-Capillary 246 (*)    All other components within normal limits  BASIC METABOLIC PANEL - Abnormal; Notable for the following:    Glucose, Bld 180 (*)    BUN 31 (*)    Creatinine, Ser 2.09 (*)    Calcium 8.6 (*)    GFR calc non Af Amer 21 (*)    GFR calc Af Amer 24 (*)    All other components within normal limits  CBC - Abnormal; Notable for the following:    WBC 11.4 (*)    RBC 3.16 (*)    Hemoglobin 9.6 (*)    HCT 29.4 (*)    RDW 15.8 (*)    All other components within normal limits  GLUCOSE, CAPILLARY - Abnormal; Notable for the following:    Glucose-Capillary 229 (*)    All other components within normal limits  GLUCOSE, CAPILLARY - Abnormal; Notable for the following:    Glucose-Capillary 139 (*)    All other  components within normal limits  GLUCOSE, CAPILLARY - Abnormal; Notable for the following:    Glucose-Capillary 171 (*)    All other components within normal limits  GLUCOSE, CAPILLARY - Abnormal; Notable for the following:    Glucose-Capillary 248 (*)    All other components within normal limits  CBC - Abnormal; Notable for the following:    WBC 13.6 (*)    RBC 3.00 (*)    Hemoglobin 9.1 (*)    HCT 27.8 (*)    RDW 15.9 (*)    All other components within normal limits  BASIC METABOLIC PANEL - Abnormal; Notable for the following:    Glucose, Bld 181 (*)    BUN 40 (*)    Creatinine, Ser 2.27 (*)    Calcium 8.6 (*)    GFR calc non Af Amer 19 (*)    GFR calc Af Amer 22 (*)    All other components within normal limits  GLUCOSE, CAPILLARY - Abnormal; Notable for the following:    Glucose-Capillary 152 (*)    All other components within normal limits  GLUCOSE, CAPILLARY - Abnormal; Notable for the following:    Glucose-Capillary 213 (*)    All other components within normal limits  GLUCOSE, CAPILLARY - Abnormal; Notable for the following:    Glucose-Capillary 136 (*)    All other components within normal limits  GLUCOSE, CAPILLARY - Abnormal; Notable for the following:    Glucose-Capillary 260 (*)    All other components within normal limits  CBC - Abnormal; Notable for the following:    WBC 11.8 (*)    RBC 3.05 (*)    Hemoglobin 9.2 (*)    HCT 28.3 (*)    RDW 16.0 (*)    All other components within normal limits  BASIC  METABOLIC PANEL - Abnormal; Notable for the following:    Glucose, Bld 163 (*)    BUN 56 (*)    Creatinine, Ser 2.45 (*)    Calcium 8.3 (*)    GFR calc non Af Amer 17 (*)    GFR calc Af Amer 20 (*)    All other components within normal limits  GLUCOSE, CAPILLARY - Abnormal; Notable for the following:    Glucose-Capillary 128 (*)    All other components within normal limits  GLUCOSE, CAPILLARY - Abnormal; Notable for the following:    Glucose-Capillary 143  (*)    All other components within normal limits  GLUCOSE, CAPILLARY - Abnormal; Notable for the following:    Glucose-Capillary 136 (*)    All other components within normal limits  GLUCOSE, CAPILLARY - Abnormal; Notable for the following:    Glucose-Capillary 126 (*)    All other components within normal limits  GLUCOSE, CAPILLARY - Abnormal; Notable for the following:    Glucose-Capillary 114 (*)    All other components within normal limits  GLUCOSE, CAPILLARY - Abnormal; Notable for the following:    Glucose-Capillary 219 (*)    All other components within normal limits  GLUCOSE, CAPILLARY - Abnormal; Notable for the following:    Glucose-Capillary 152 (*)    All other components within normal limits  BASIC METABOLIC PANEL - Abnormal; Notable for the following:    Glucose, Bld 139 (*)    BUN 56 (*)    Creatinine, Ser 2.20 (*)    Calcium 8.5 (*)    GFR calc non Af Amer 20 (*)    GFR calc Af Amer 23 (*)    All other components within normal limits  GLUCOSE, CAPILLARY - Abnormal; Notable for the following:    Glucose-Capillary 129 (*)    All other components within normal limits  BASIC METABOLIC PANEL - Abnormal; Notable for the following:    Glucose, Bld 179 (*)    BUN 52 (*)    Creatinine, Ser 2.14 (*)    Calcium 8.5 (*)    GFR calc non Af Amer 20 (*)    GFR calc Af Amer 23 (*)    All other components within normal limits  GLUCOSE, CAPILLARY - Abnormal; Notable for the following:    Glucose-Capillary 222 (*)    All other components within normal limits  GLUCOSE, CAPILLARY - Abnormal; Notable for the following:    Glucose-Capillary 139 (*)    All other components within normal limits  GLUCOSE, CAPILLARY - Abnormal; Notable for the following:    Glucose-Capillary 145 (*)    All other components within normal limits  I-STAT CHEM 8, ED - Abnormal; Notable for the following:    BUN 27 (*)    Creatinine, Ser 1.70 (*)    Glucose, Bld 178 (*)    Hemoglobin 10.2 (*)    HCT  30.0 (*)    All other components within normal limits  MRSA PCR SCREENING  PROTIME-INR  GLUCOSE, CAPILLARY  GLUCOSE, CAPILLARY  I-STAT CG4 LACTIC ACID, ED  Randolm Idol, ED    EKG  EKG Interpretation  Date/Time:  Sunday June 08 2016 06:37:35 EST Ventricular Rate:  104 PR Interval:    QRS Duration: 99 QT Interval:  299 QTC Calculation: 394 R Axis:   54 Text Interpretation:  Atrial fibrillation with repolarization abnormality Confirmed by Wilson Singer  MD, Anish Vana [id number]) on 06/08/2016 8:42:28 AM       Radiology Dg Chest Port 1  View  Result Date: 06/08/2016 CLINICAL DATA:  82 year old female with acute onset of severe shortness of Breath. Recent unrelenting illness, recently hospitalized. Initial encounter. EXAM: PORTABLE CHEST 1 VIEW COMPARISON:  06/01/2016 and earlier. FINDINGS: Portable AP semi upright view at 0634 hours. Continued small to moderate bilateral pleural effusions with dense bibasilar opacification. Stable cardiomegaly and mediastinal contours. Calcified aortic atherosclerosis. No pneumothorax. Pulmonary vascular congestion without overt edema. Stable bilateral neck surgical clips such as due to prior carotid surgery. No acute osseous abnormality identified. IMPRESSION: Up to moderate bilateral pleural effusions with associated bibasilar collapse or consolidation. No acute edema. Chronic cardiomegaly and Calcified aortic atherosclerosis. Electronically Signed   By: Genevie Ann M.D.   On: 06/08/2016 07:46    Procedures Procedures (including critical care time)  Medications Ordered in ED Medications  albuterol (PROVENTIL) (2.5 MG/3ML) 0.083% nebulizer solution 2.5 mg (not administered)  vancomycin (VANCOCIN) IVPB 1000 mg/200 mL premix (0 mg Intravenous Stopped 06/08/16 0933)  piperacillin-tazobactam (ZOSYN) IVPB 3.375 g (0 g Intravenous Stopped 06/08/16 0933)  methylPREDNISolone sodium succinate (SOLU-MEDROL) 125 mg/2 mL injection 125 mg (125 mg Intravenous Given  06/08/16 0822)  albuterol (PROVENTIL) (2.5 MG/3ML) 0.083% nebulizer solution 5 mg (5 mg Nebulization Given 06/08/16 0813)  methylPREDNISolone sodium succinate (SOLU-MEDROL) 125 mg/2 mL injection 60 mg (60 mg Intravenous Given 06/09/16 0805)    Followed by  predniSONE (DELTASONE) tablet 50 mg (50 mg Oral Given 06/12/16 1802)  furosemide (LASIX) injection 40 mg (40 mg Intravenous Given 06/08/16 1301)  oseltamivir (TAMIFLU) capsule 30 mg (30 mg Oral Given 06/12/16 1100)  chlorpheniramine-HYDROcodone (TUSSIONEX) 10-8 MG/5ML suspension 5 mL (5 mLs Oral Given 06/08/16 2122)     Initial Impression / Assessment and Plan / ED Course  I have reviewed the triage vital signs and the nursing notes.  Pertinent labs & imaging results that were available during my care of the patient were reviewed by me and considered in my medical decision making (see chart for details).  Clinical Course     85yF with dyspnea. I think primarily COPD exacerbation. Wheezing on exam. Increased oxygen requirement. Mild leukocytosis. She reports subjective fever and chills. CXR with b/l effusions, but no focal infiltrate. Concerning enough to treat for possible pneumonia, particularly with soft BP. Pt was persistently hypertensive on recent hospitalization. Will check for flu. Hx of diastolic HF. She reports her edema is stable though. Diuresis in ED deferred 2/2 her BP.   Final Clinical Impressions(s) / ED Diagnoses   Final diagnoses:  Respiratory distress    New Prescriptions New Prescriptions   No medications on file     Virgel Manifold, MD 06/16/16 1609

## 2016-06-08 NOTE — ED Triage Notes (Signed)
Pt brought in by EMS, pt is from home and lives with family.  Pt had an episode of SOB onset occurred around 5 am, pt family gave the patient Albuterol inhaler and was not effective in managing SOB/  Per EMS pt had expiratory wheezes, and is on home oxygen @ 2 lpm via [place] as per EMS pt saturation drop to mid 70%, and was given duoneb and o2 @ saturation increased to mid 90's, Pt has hx of COPD, AFIB, CHF   CBG 171

## 2016-06-08 NOTE — H&P (Addendum)
[facility] History and Physical  Debbie [name] [id number] DOB: 1930-10-26 DOA: 06/08/2016  PCP: Myriam Jacobson, MD  Patient coming from: Home  Chief Complaint: Shortness of breath  HPI: Debbie [name] is a 82 y.o. female with a medical history of hypertension, COPD on 2 L of home oxygen, diastolic heart failure, diabetes mellitus, presented to the emergency department with complaints of shortness of breath. Patient states she woke this morning and was unable to breathe. She does not have a nebulizer at home and only uses inhalers, without any improvement in her symptoms. Patient states she also had dry cough. She denies any chest pain, ill contacts, recent travel, abdominal pain, nausea or vomiting, diarrhea or constipation. Patient does complain of lower extremity swelling but states that this is chronic. Patient has alternating bouts of fever and chills. She denies any ill contacts. She was recently hospitalized approximately one week ago for respiratory failure and urinary tract infection.  ED Course: Found to have COPD exacerbation. Started on antibiotics, given solumedrol and nebs treatments. TRH called for admission.  Review of Systems:  All other systems reviewed and are negative.   Past Medical History:  Diagnosis Date  . Allergy   . Anxiety   . Arthritis   . Cancer ([place]) dx'd 12/2007   endometroid adenocarcinoma  . Cataract    both  . Diabetes mellitus    fasting [id number]  . Dizziness   . History of radiation therapy 9/21,9/28,10/05,05/20/2008   4 txs 2400 cGy endometrial adenocarcinoma  . Hypertension   . Peripheral vascular disease ([place])   . Seasonal allergies   . 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 UTI (urinary tract infection) 05/2016    Past Surgical History:  Procedure Laterality Date  . ABDOMINAL HYSTERECTOMY    . APPENDECTOMY    . ENDARTERECTOMY Left 01/12/2014   Procedure: LEFT CAROTID ARTERY ENDARTERECTOMY WITH  HEMASHIELD PATCH ANGIOPLASTY;  Surgeon: Mal Misty, MD;  Location: [facility];  Service: Vascular;  Laterality: Left;  . ENDARTERECTOMY Right 03/17/2014   Procedure: RIGHT CAROTD ARTERY ENDARTERECTOMY WITH DACRON PATCH ANGIOPLASTY;  Surgeon: Mal Misty, MD;  Location: [facility];  Service: Vascular;  Laterality: Right;  . EYE SURGERY Bilateral    cataracts  . NASAL SEPTUM SURGERY    . NECK SURGERY Bilateral    arterial  . TONSILLECTOMY      Social History:  reports that she has never smoked. She has never used smokeless tobacco. She reports that she does not drink alcohol or use drugs.  Allergies  Allergen Reactions  . Codeine Other (See Comments)    Unusual mouth sensation    Family History  Problem Relation Age of Onset  . Actinic keratosis Mother   . Cancer Mother     uterine  . Heart disease Father   . Hyperlipidemia Father   . Hypertension Father   . Stroke Father   . Cancer Sister     lung    Prior to Admission medications   Medication Sig Start Date End Date Taking? Authorizing Provider  Albuterol Sulfate (PROAIR RESPICLICK) [id number] (90 Base) MCG/ACT AEPB Inhale 1-2 puffs into the lungs every 4 (four) hours as needed. 06/02/16  Yes Doreatha Lew, MD  amLODipine (NORVASC) 10 MG tablet Take 1 tablet (10 mg total) by mouth daily. 06/03/16  Yes Doreatha Lew, MD  aspirin EC 81 MG tablet Take 81 mg by mouth daily.   Yes Historical Provider, MD  atenolol (  TENORMIN) 50 MG tablet Take 1 tablet (50 mg total) by mouth 2 (two) times daily. Patient taking differently: Take 50 mg by mouth daily.  06/02/16  Yes Doreatha Lew, MD  atorvastatin (LIPITOR) 20 MG tablet Take 20 mg by mouth daily. 05/26/16  Yes Historical Provider, MD  glimepiride (AMARYL) 4 MG tablet Take 4 mg by mouth daily. 05/23/16  Yes Historical Provider, MD  hydrALAZINE (APRESOLINE) 25 MG tablet Take 25 mg by mouth 3 (three) times daily.    Yes Historical Provider, MD  linagliptin (TRADJENTA) 5 MG TABS  tablet Take 1 tablet (5 mg total) by mouth daily. 06/02/16  Yes Doreatha Lew, MD  lisinopril (PRINIVIL,ZESTRIL) 2.5 MG tablet Take 2.5 mg by mouth daily.  02/12/16  Yes Historical Provider, MD  metFORMIN (GLUCOPHAGE) 1000 MG tablet Take 1,000 mg by mouth daily with breakfast. 05/20/16  Yes Historical Provider, MD  mometasone-formoterol (DULERA) 100-5 MCG/ACT AERO Inhale 2 puffs into the lungs 2 (two) times daily. 06/02/16  Yes Doreatha Lew, MD  Multiple Vitamin (MULTIVITAMIN) tablet Take 1 tablet by mouth daily. Centrum silver   Yes Historical Provider, MD  sodium chloride (OCEAN) 0.65 % SOLN nasal spray Place 1 spray into both nostrils as needed for congestion.   Yes Historical Provider, MD  torsemide (DEMADEX) 10 MG tablet Take 1 tablet (10 mg total) by mouth daily. 06/03/16  Yes Doreatha Lew, MD    Physical Exam: Vitals:   06/08/16 0930 06/08/16 0933  BP: (!) 81/57 (!) 81/57  Pulse: 69 71  Resp: 16 17  Temp:       General: Well developed, well nourished, NAD, appears stated age  HEENT: NCAT, PERRLA, EOMI, Anicteic Sclera, mucous membranes moist.   Neck: Supple, no JVD, no masses  Cardiovascular: S1 S2 auscultated, no rubs, murmurs or gallops. Regular rate and rhythm.  Respiratory: Diffuse expiratory wheezing, diminished breath sounds  Abdomen: Soft, nontender, nondistended, + bowel sounds  Extremities: warm dry without cyanosis clubbing. 1+ LE edema B/L  Neuro: AAOx3, cranial nerves grossly intact. Strength 5/5 in patient's upper and lower extremities bilaterally  Skin: Without rashes exudates or nodules  Psych: Normal affect and demeanor with intact judgement and insight  Labs on Admission: I have personally reviewed following labs and imaging studies CBC:  Recent Labs Lab 06/08/16 0751 06/08/16 0808  WBC 10.8*  --   NEUTROABS 9.6*  --   HGB 9.8* 10.2*  HCT 30.8* 30.0*  MCV 94.5  --   PLT 327  --    Basic Metabolic Panel:  Recent Labs Lab  06/08/16 0751 06/08/16 0808  NA 144 143  K 3.7 3.7  CL 103 103  CO2 30  --   GLUCOSE 174* 178*  BUN 24* 27*  CREATININE 1.51* 1.70*  CALCIUM 8.6*  --    GFR: Estimated Creatinine Clearance: 26.3 mL/min (by C-G formula based on SCr of 1.7 mg/dL (H)). Liver Function Tests:  Recent Labs Lab 06/08/16 0751  AST 22  ALT 21  ALKPHOS 64  BILITOT 0.7  PROT 6.1*  ALBUMIN 2.7*   No results for input(s): LIPASE, AMYLASE in the last 168 hours. No results for input(s): AMMONIA in the last 168 hours. Coagulation Profile:  Recent Labs Lab 06/08/16 0751  INR 0.96   Cardiac Enzymes: No results for input(s): CKTOTAL, CKMB, CKMBINDEX, TROPONINI in the last 168 hours. BNP (last 3 results) No results for input(s): PROBNP in the last 8760 hours. HbA1C: No results for input(s): HGBA1C in  the last 72 hours. CBG:  Recent Labs Lab 06/01/16 1136 06/01/16 1624 06/01/16 2147 06/02/16 0841 06/02/16 1245  GLUCAP 130* 130* 120* 92 220*   Lipid Profile: No results for input(s): CHOL, HDL, LDLCALC, TRIG, CHOLHDL, LDLDIRECT in the last 72 hours. Thyroid Function Tests: No results for input(s): TSH, T4TOTAL, FREET4, T3FREE, THYROIDAB in the last 72 hours. Anemia Panel: No results for input(s): VITAMINB12, FOLATE, FERRITIN, TIBC, IRON, RETICCTPCT in the last 72 hours. Urine analysis:    Component Value Date/Time   COLORURINE YELLOW 05/28/2016 0848   APPEARANCEUR CLEAR 05/28/2016 0848   LABSPEC 1.008 05/28/2016 0848   PHURINE 6.0 05/28/2016 0848   GLUCOSEU 50 (A) 05/28/2016 0848   HGBUR NEGATIVE 05/28/2016 0848   BILIRUBINUR NEGATIVE 05/28/2016 0848   KETONESUR NEGATIVE 05/28/2016 0848   PROTEINUR 100 (A) 05/28/2016 0848   UROBILINOGEN 1.0 03/07/2014 1510   NITRITE POSITIVE (A) 05/28/2016 0848   LEUKOCYTESUR TRACE (A) 05/28/2016 0848   Sepsis Labs: @LABRCNTIP (procalcitonin:4,lacticidven:4) )No results found for this or any previous visit (from the past 240 hour(s)).    Radiological Exams on Admission: Dg Chest Port 1 View  Result Date: 06/08/2016 CLINICAL DATA:  82 year old female with acute onset of severe shortness of Breath. Recent unrelenting illness, recently hospitalized. Initial encounter. EXAM: PORTABLE CHEST 1 VIEW COMPARISON:  06/01/2016 and earlier. FINDINGS: Portable AP semi upright view at 0634 hours. Continued small to moderate bilateral pleural effusions with dense bibasilar opacification. Stable cardiomegaly and mediastinal contours. Calcified aortic atherosclerosis. No pneumothorax. Pulmonary vascular congestion without overt edema. Stable bilateral neck surgical clips such as due to prior carotid surgery. No acute osseous abnormality identified. IMPRESSION: Up to moderate bilateral pleural effusions with associated bibasilar collapse or consolidation. No acute edema. Chronic cardiomegaly and Calcified aortic atherosclerosis. Electronically Signed   By: Genevie Ann M.D.   On: 06/08/2016 07:46    EKG: Independently reviewed. Poor baseline, sinus, 104. (EKG reading A. Fib?)  Assessment/Plan  Acute on chronic hypoxic respiratory failure -Multifactorial including COPD exacerbation versus CHF exacerbation versus pneumonia -Patient presented to EMS with SPO2 in the 70s. Given DuoNeb treatment and placed on 4 L nasal cannula -Patient uses 2 L nasal cannula at home -Chest x-ray shows moderate pleural effusions with possible consolidation no acute edema. -Continue neb treatments, dulera, Solu-Medrol, antibiotics -Influenza PCR pending  COPD exacerbation -Profuse wheezing noted on exam -Treatment and plan as above -Will add on Mucinex -COPD order set utilized -Pulmonology, Dr. Lake Bells, consulted and appreciated  Possible acute diastolic heart failure -Echocardiogram 05/30/2016 showed an EF of 99991111, grade 2 diastolic dysfunction -BNP 690.8 -Chest x-ray does show pleural effusions however no overt edema -Patient does have lower extremity edema  which she states is chronic -Will hold diuretics at this time given her low blood pressures  Hypotension -Patient is not appear to be septic. Leukocytosis 10.8, temperature 99.6, lactic acid of 1.39 -Not entirely sure that blood pressure reading is accurate. -Will order manual blood pressures and continue to follow closely -Patient is normally hypertensive  Chronic kidney disease, stage III -Baseline creatinine approximate 2, currently 1.7 -Continue to monitor BMP  Diabetes mellitus, type II -Hold glimepiride, metformin, tradjenta -Placed on insulin sliding scale was CBG monitoring  Essential hypertension -Hold amlodipine, atenolol, hydralazine, lisinopril, torsemide  Hyperlipidemia -Continue statin  Chronic normocytic anemia -Baseline hemoglobin approximate 10, currently stable -Monitor CBC  DVT prophylaxis: Lovenox  Code Status: No CPR, may intubate  Family Communication: Son at bedside. Admission, patients condition and plan of care including tests  being ordered have been discussed with the patient and son who indicate understanding and agree with the plan and Code Status.  Disposition Plan: Home when stable   Consults called: Pulmonology/PCCM  Admission status: Inpatient   Time spent: 70 minutes  Carrson Lightcap D.O. [facility] Pager [phone]  If 7PM-7AM, please contact night-coverage [email or web address] Password [email or web address] 06/08/2016, 9:53 AM

## 2016-06-09 LAB — GLUCOSE, CAPILLARY
GLUCOSE-CAPILLARY: 171 mg/dL — AB (ref 65–99)
Glucose-Capillary: 139 mg/dL — ABNORMAL HIGH (ref 65–99)
Glucose-Capillary: 248 mg/dL — ABNORMAL HIGH (ref 65–99)
Glucose-Capillary: 152 mg/dL — ABNORMAL HIGH (ref 65–99)
Glucose-Capillary: 213 mg/dL — ABNORMAL HIGH (ref 65–99)

## 2016-06-09 LAB — BASIC METABOLIC PANEL
Anion gap: 10 (ref 5–15)
BUN: 31 mg/dL — AB (ref 6–20)
CHLORIDE: 103 mmol/L (ref 101–111)
CO2: 29 mmol/L (ref 22–32)
CREATININE: 2.09 mg/dL — AB (ref 0.44–1.00)
Calcium: 8.6 mg/dL — ABNORMAL LOW (ref 8.9–10.3)
GFR calc non Af Amer: 21 mL/min — ABNORMAL LOW (ref >60)
GFR, EST AFRICAN AMERICAN: 24 mL/min — AB (ref >60)
Glucose, Bld: 180 mg/dL — ABNORMAL HIGH (ref 65–99)
POTASSIUM: 3.6 mmol/L (ref 3.5–5.1)
SODIUM: 142 mmol/L (ref 135–145)

## 2016-06-09 LAB — CBC
HEMATOCRIT: 29.4 % — AB (ref 36.0–46.0)
Hemoglobin: 9.6 g/dL — ABNORMAL LOW (ref 12.0–15.0)
MCH: 30.4 pg (ref 26.0–34.0)
MCHC: 32.7 g/dL (ref 30.0–36.0)
MCV: 93 fL (ref 78.0–100.0)
Platelets: 303 10*3/uL (ref 150–400)
RBC: 3.16 MIL/uL — AB (ref 3.87–5.11)
RDW: 15.8 % — ABNORMAL HIGH (ref 11.5–15.5)
WBC: 11.4 10*3/uL — AB (ref 4.0–10.5)

## 2016-06-09 MED ORDER — ALBUTEROL SULFATE (2.5 MG/3ML) 0.083% IN NEBU: 2.5000 mg | INHALATION_SOLUTION | RESPIRATORY_TRACT | Status: DC | PRN

## 2016-06-09 MED ORDER — TORSEMIDE 10 MG PO TABS
10.0000 mg | ORAL_TABLET | Freq: Every day | ORAL | Status: DC
  Administered 2016-06-09: 10 mg via ORAL
  Filled 2016-06-09 (×2): qty 1

## 2016-06-09 MED ORDER — HYDROCOD POLST-CPM POLST ER 10-8 MG/5ML PO SUER
5.0000 mL | Freq: Every evening | ORAL | Status: DC | PRN
  Administered 2016-06-09 – 2016-06-12 (×3): 5 mL via ORAL
  Filled 2016-06-09 (×3): qty 5

## 2016-06-09 MED ORDER — IPRATROPIUM-ALBUTEROL 0.5-2.5 (3) MG/3ML IN SOLN
3.0000 mL | Freq: Four times a day (QID) | RESPIRATORY_TRACT | Status: DC
Start: 2016-06-09 — End: 2016-06-09
  Administered 2016-06-09 (×2): 3 mL via RESPIRATORY_TRACT
  Filled 2016-06-09 (×2): qty 3

## 2016-06-09 MED ORDER — IPRATROPIUM-ALBUTEROL 0.5-2.5 (3) MG/3ML IN SOLN: 3.0000 mL | RESPIRATORY_TRACT | Status: DC | PRN

## 2016-06-09 MED ORDER — IPRATROPIUM-ALBUTEROL 0.5-2.5 (3) MG/3ML IN SOLN
3.0000 mL | Freq: Three times a day (TID) | RESPIRATORY_TRACT | Status: DC
  Administered 2016-06-10 – 2016-06-13 (×10): 3 mL via RESPIRATORY_TRACT
  Filled 2016-06-09 (×10): qty 3

## 2016-06-09 NOTE — Progress Notes (Signed)
CSW consulted for COPD GOLD protocol. Past admissions reviewed. Pt does not meet criteria for COPD Gold protocol.   CSW signing off.  Werner Lean LCSW [phone]

## 2016-06-09 NOTE — Progress Notes (Addendum)
Patient ID: Debbie [name], female   DOB: 05/23/1931, 83 y.o.   MRN: [id number]    PROGRESS NOTE    Debbie [name]  [id number] DOB: 10/31/1930 DOA: 06/08/2016  PCP: Myriam Jacobson, MD   Brief Narrative:  83 y.o. female with known hypertension, COPD on 2 L of home oxygen, diastolic heart failure, diabetes mellitus, presented to the emergency department with several days duration of progressively worsening dyspnea that initially started with exertion and has progressed to dyspnea at rest 24 hours prior to this admission. This was associated with subjective fevers, chills, poor oral intake, wheezing, mixed non productive and productive cough of clear sputum. She was recently hospitalized for acute diastolic CHF and UTI.   Assessment & Plan:   Acute on chronic hypoxic respiratory failure - Multifactorial including acute COPD exacerbation, bibasilar PNA (HCAP given recent hospitalization and flu +), acute on chronic diastolic CHF exacerbation  - pt is on 2 L oxygen via [place] at baseline  - pt reports feeling better this AM, sitting in chair and able to eat breakfast - will continue treatment with empiric ABX day #2, also continue Tamiflu day #2 - still with mild exp wheezing this AM, so needs BD's scheduled and as needed  Acute COPD exacerbation - treat as noted above, BD's scheduled and as needed - added Mucinex - continue Prednisone and plan on tapering down in next 24-48 hours - continue ABX and Tamiflu   Possible acute diastolic heart failure - Echocardiogram 05/30/2016 showed an EF of 99991111, grade 2 diastolic dysfunction - Chest x-ray does show pleural effusions however no overt edema - Patient does have lower extremity edema which she states is chronic - diuretics were held initially due to low BP - BP stable this AM, can start diuretics   Hypotension - resolved, SBP in 140's this AM  - amlodipine, atenolol, hydralazine, lisinopril, torsemide all held since  admission - plan to resume torsemide this AM  Chronic kidney disease, stage III - Baseline creatinine approximately 2 - up this am compared to yesterday - BMP in AM  Diabetes mellitus, type II with complications of nephropathy  - Hold glimepiride, metformin, tradjenta - SSI ordered for now   Hyperlipidemia - Continue statin  Chronic normocytic anemia - Baseline hemoglobin approximate 10, currently stable - Monitor CBC  DVT prophylaxis: Lovenox SQ Code Status: Full Family Communication: Patient at bedside  Disposition Plan: To be determined  Consultants:   None  Procedures:   None  Antimicrobials:   Vancomycin 12/31 x 1  Zosyn 12/31 -->   Tamiflu 12/31 -->  Subjective: Pt reports feeling better, still with mixed non productive and productive cough, wheezing.   Objective: Vitals:   06/09/16 0400 06/09/16 0500 06/09/16 0600 06/09/16 0829  BP: (!) 122/56  (!) 142/90   Pulse: (!) 59  74   Resp: 13  15   Temp:    97.6 F (36.4 C)  TempSrc:    Oral  SpO2: 99%  99%   Weight:  80.7 kg (177 lb 14.6 oz)      Intake/Output Summary (Last 24 hours) at 06/09/16 1001 Last data filed at 06/09/16 0700  Gross per 24 hour  Intake             2033 ml  Output              500 ml  Net             1533 ml   Filed  Weights   06/08/16 1200 06/09/16 0500  Weight: 79.4 kg (175 lb 0.7 oz) 80.7 kg (177 lb 14.6 oz)    Examination:  General exam: Appears calm and comfortable  Respiratory system: Rales noted bilaterally with mild exp wheezing, crackles at bases  Cardiovascular system: S1 & S2 heard, RRR. No JVD, rubs, gallops or clicks. Bilateral pitting edema.  Gastrointestinal system: Abdomen is nondistended, soft and nontender. No organomegaly or masses felt. Central nervous system: Alert and oriented. No focal neurological deficits. Extremities: Symmetric 5 x 5 power.  Data Reviewed: I have personally reviewed following labs and imaging studies  CBC:  Recent  Labs Lab 06/08/16 0751 06/08/16 0808 06/09/16 0401  WBC 10.8*  --  11.4*  NEUTROABS 9.6*  --   --   HGB 9.8* 10.2* 9.6*  HCT 30.8* 30.0* 29.4*  MCV 94.5  --  93.0  PLT 327  --  [id number]   Basic Metabolic Panel:  Recent Labs Lab 06/08/16 0751 06/08/16 0808 06/09/16 0401  NA 144 143 142  K 3.7 3.7 3.6  CL 103 103 103  CO2 30  --  29  GLUCOSE 174* 178* 180*  BUN 24* 27* 31*  CREATININE 1.51* 1.70* 2.09*  CALCIUM 8.6*  --  8.6*   Liver Function Tests:  Recent Labs Lab 06/08/16 0751  AST 22  ALT 21  ALKPHOS 64  BILITOT 0.7  PROT 6.1*  ALBUMIN 2.7*   Coagulation Profile:  Recent Labs Lab 06/08/16 0751  INR 0.96   CBG:  Recent Labs Lab 06/02/16 1245 06/08/16 1546 06/08/16 2137 06/09/16 0751  GLUCAP 220* 246* 229* 139*   Urine analysis:    Component Value Date/Time   COLORURINE YELLOW 05/28/2016 0848   APPEARANCEUR CLEAR 05/28/2016 0848   LABSPEC 1.008 05/28/2016 0848   PHURINE 6.0 05/28/2016 0848   GLUCOSEU 50 (A) 05/28/2016 0848   HGBUR NEGATIVE 05/28/2016 0848   BILIRUBINUR NEGATIVE 05/28/2016 0848   KETONESUR NEGATIVE 05/28/2016 0848   PROTEINUR 100 (A) 05/28/2016 0848   UROBILINOGEN 1.0 03/07/2014 1510   NITRITE POSITIVE (A) 05/28/2016 0848   LEUKOCYTESUR TRACE (A) 05/28/2016 0848    Recent Results (from the past 240 hour(s))  MRSA PCR Screening     Status: None   Collection Time: 06/08/16 11:41 AM  Result Value Ref Range Status   MRSA by PCR NEGATIVE NEGATIVE Final    Comment:        The GeneXpert MRSA Assay (FDA approved for NASAL specimens only), is one component of a comprehensive MRSA colonization surveillance program. It is not intended to diagnose MRSA infection nor to guide or monitor treatment for MRSA infections.       Radiology Studies: Dg Chest Port 1 View  Result Date: 06/08/2016 CLINICAL DATA:  83 year old female with acute onset of severe shortness of Breath. Recent unrelenting illness, recently hospitalized.  Initial encounter. EXAM: PORTABLE CHEST 1 VIEW COMPARISON:  06/01/2016 and earlier. FINDINGS: Portable AP semi upright view at 0634 hours. Continued small to moderate bilateral pleural effusions with dense bibasilar opacification. Stable cardiomegaly and mediastinal contours. Calcified aortic atherosclerosis. No pneumothorax. Pulmonary vascular congestion without overt edema. Stable bilateral neck surgical clips such as due to prior carotid surgery. No acute osseous abnormality identified. IMPRESSION: Up to moderate bilateral pleural effusions with associated bibasilar collapse or consolidation. No acute edema. Chronic cardiomegaly and Calcified aortic atherosclerosis. Electronically Signed   By: Genevie Ann M.D.   On: 06/08/2016 07:46      Scheduled Meds: . aspirin  EC  81 mg Oral Daily  . atorvastatin  20 mg Oral Daily  . enoxaparin (LOVENOX) injection  30 mg Subcutaneous Q24H  . insulin aspart  0-15 Units Subcutaneous TID WC  . insulin aspart  0-5 Units Subcutaneous QHS  . ipratropium-albuterol  3 mL Nebulization QID  . mometasone-formoterol  2 puff Inhalation BID  . multivitamin with minerals  1 tablet Oral Daily  . oseltamivir  30 mg Oral Daily  . piperacillin-tazobactam (ZOSYN)  IV  3.375 g Intravenous Q8H  . predniSONE  40 mg Oral Q supper  . sodium chloride flush  3 mL Intravenous Q12H   Continuous Infusions: . sodium chloride 100 mL (06/08/16 1955)     LOS: 1 day   Time spent: 20 minutes   Faye Ramsay, MD [facility] Pager [phone]  If 7PM-7AM, please contact night-coverage [email or web address] Password [email or web address] 06/09/2016, 10:01 AM

## 2016-06-09 NOTE — Evaluation (Signed)
Physical Therapy Evaluation Patient Details Name: Debbie [name] MRN: [id number] DOB: 1930-08-29 Today's Date: 06/09/2016   History of Present Illness  83 y.o. 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 Pt currently with functional limitations due to the deficits listed below (see PT Problem List).  Pt will benefit from skilled PT to increase their independence and safety with mobility to allow discharge to the venue listed below.  Pt assisted with short distance ambulation today and remained on 3L O2 [place] with SPO2 97% during gait.  Pt and son agreeable to ST-SNF upon d/c for pt to regain more independence prior to home with CNA.      Follow Up Recommendations SNF    Equipment Recommendations  Rolling walker with 5" wheels    Recommendations for Other Services       Precautions / Restrictions Precautions Precautions: Fall Precaution Comments: chronic 2L O2      Mobility  Bed Mobility Overal bed mobility: Needs Assistance Bed Mobility: Supine to Sit;Sit to Supine     Supine to sit: Supervision;HOB elevated Sit to supine: Min assist   General bed mobility comments: heavy use of rail to pull up, assist for LEs onto bed  Transfers Overall transfer level: Needs assistance Equipment used: Rolling walker (2 wheeled) Transfers: Sit to/from Stand Sit to Stand: Min assist         General transfer comment: verbal cues for technique, assist to rise and steady  Ambulation/Gait Ambulation/Gait assistance: Min guard Ambulation Distance (Feet): 50 Feet Assistive device: Rolling walker (2 wheeled) Gait Pattern/deviations: Step-through  pattern;Decreased stride length;Trunk flexed     General Gait Details: pt reports slight dizziness which did not resolve or become worse during ambulation, SpO2 in high 90s on 3L O2 , distance limited by dizziness  Stairs            Wheelchair Mobility    Modified Rankin (Stroke Patients Only)       Balance Overall balance assessment: Needs assistance Sitting-balance support: Feet supported;No upper extremity supported Sitting balance-Leahy Scale: Good     Standing balance support: During functional activity;Bilateral upper extremity supported Standing balance-Leahy Scale: Poor Standing balance comment: requiring RW for steadying at this time                             Pertinent Vitals/Pain Pain Assessment: No/denies pain    Home Living Family/patient expects to be discharged to:: Private residence Living Arrangements: Children Available Help at Discharge: Family;Personal care attendant;Available 24 hours/day (son planning to hire CNA for daytime) Type of Home: House Home Access: Stairs to enter   [employer] of Steps: 1 Home Layout: One level Home Equipment: Walker - standard      Prior Function Level of Independence: Independent with assistive device(s)         Comments: SW for mobility. Independent with ADL.     Hand Dominance   Dominant Hand: Right    Extremity/Trunk Assessment        Lower Extremity Assessment Lower Extremity Assessment: Generalized weakness    Cervical / Trunk Assessment Cervical / Trunk Assessment: Kyphotic  Communication   Communication: No difficulties  Cognition Arousal/Alertness: Awake/alert  Behavior During Therapy: WFL for tasks assessed/performed Overall Cognitive Status: Within Functional Limits for tasks assessed                      General Comments      Exercises     Assessment/Plan    PT Assessment Patient needs continued PT services  PT Problem List Decreased  strength;Decreased balance;Decreased activity tolerance;Decreased knowledge of use of DME;Decreased mobility;Decreased knowledge of precautions          PT Treatment Interventions      PT Goals (Current goals can be found in the Care Plan section)  Acute Rehab PT Goals PT Goal Formulation: With patient/family Time For Goal Achievement: 06/23/16 Potential to Achieve Goals: Good    Frequency Min 3X/week   Barriers to discharge        Co-evaluation               End of Session Equipment Utilized During Treatment: Gait belt Activity Tolerance: Other (comment) (limited by dizziness) Patient left: with call bell/phone within reach;in bed;with bed alarm set;with family/visitor present Nurse Communication: Mobility status         Time: 1347-1406 PT Time Calculation (min) (ACUTE ONLY): 19 min   Charges:   PT Evaluation $PT Eval Moderate Complexity: 1 Procedure     PT G Codes:        Bell Cai,KATHrine E 06/09/2016, 3:30 PM Carmelia Bake, PT, DPT 06/09/2016 Pager: [phone]

## 2016-06-10 DIAGNOSIS — J449 Chronic obstructive pulmonary disease, unspecified: Secondary | ICD-10-CM

## 2016-06-10 DIAGNOSIS — R0902 Hypoxemia: Secondary | ICD-10-CM

## 2016-06-10 LAB — BASIC METABOLIC PANEL
ANION GAP: 9 (ref 5–15)
BUN: 40 mg/dL — ABNORMAL HIGH (ref 6–20)
CO2: 30 mmol/L (ref 22–32)
Calcium: 8.6 mg/dL — ABNORMAL LOW (ref 8.9–10.3)
Chloride: 104 mmol/L (ref 101–111)
Creatinine, Ser: 2.27 mg/dL — ABNORMAL HIGH (ref 0.44–1.00)
GFR calc Af Amer: 22 mL/min — ABNORMAL LOW (ref >60)
GFR, EST NON AFRICAN AMERICAN: 19 mL/min — AB (ref >60)
Glucose, Bld: 181 mg/dL — ABNORMAL HIGH (ref 65–99)
POTASSIUM: 3.9 mmol/L (ref 3.5–5.1)
SODIUM: 143 mmol/L (ref 135–145)

## 2016-06-10 LAB — GLUCOSE, CAPILLARY
GLUCOSE-CAPILLARY: 136 mg/dL — AB (ref 65–99)
GLUCOSE-CAPILLARY: 260 mg/dL — AB (ref 65–99)
GLUCOSE-CAPILLARY: 143 mg/dL — AB (ref 65–99)
Glucose-Capillary: 128 mg/dL — ABNORMAL HIGH (ref 65–99)

## 2016-06-10 LAB — CBC
HCT: 27.8 % — ABNORMAL LOW (ref 36.0–46.0)
Hemoglobin: 9.1 g/dL — ABNORMAL LOW (ref 12.0–15.0)
MCH: 30.3 pg (ref 26.0–34.0)
MCHC: 32.7 g/dL (ref 30.0–36.0)
MCV: 92.7 fL (ref 78.0–100.0)
Platelets: 288 10*3/uL (ref 150–400)
RBC: 3 MIL/uL — ABNORMAL LOW (ref 3.87–5.11)
RDW: 15.9 % — AB (ref 11.5–15.5)
WBC: 13.6 10*3/uL — ABNORMAL HIGH (ref 4.0–10.5)

## 2016-06-10 MED ORDER — LEVOFLOXACIN 500 MG PO TABS: 250.0000 mg | ORAL_TABLET | ORAL | Status: DC

## 2016-06-10 MED ORDER — LEVOFLOXACIN 500 MG PO TABS
500.0000 mg | ORAL_TABLET | ORAL | Status: DC
  Administered 2016-06-10 – 2016-06-12 (×2): 500 mg via ORAL
  Filled 2016-06-10 (×2): qty 1

## 2016-06-10 MED ORDER — PREDNISONE 20 MG PO TABS
30.0000 mg | ORAL_TABLET | Freq: Every day | ORAL | Status: DC
  Administered 2016-06-10: 18:00:00 30 mg via ORAL
  Filled 2016-06-10: qty 2

## 2016-06-10 MED ORDER — PREMIER PROTEIN SHAKE
11.0000 [oz_av] | ORAL | Status: DC
  Administered 2016-06-10: 11 [oz_av] via ORAL
  Filled 2016-06-10: qty 325.31

## 2016-06-10 NOTE — Progress Notes (Signed)
Initial Nutrition Assessment  DOCUMENTATION CODES:   Obesity unspecified  INTERVENTION:  - Will order Premier Protein once/day, this supplement provides 160 kcal and 30 grams of protein. - Continue to encourage PO intakes. - RD will continue to monitor for additional nutrition-related needs.  NUTRITION DIAGNOSIS:   Increased nutrient needs (protein) related to chronic illness (COPD) as evidenced by estimated needs.  GOAL:   Patient will meet greater than or equal to 90% of their needs  MONITOR:   PO intake, Supplement acceptance, Weight trends, Labs, Skin, I & O's  REASON FOR ASSESSMENT:   Consult COPD Protocol  ASSESSMENT:   83 y.o. female with a medical history of hypertension, COPD on 2 L of home oxygen, diastolic heart failure, diabetes mellitus, presented to the emergency department with complaints of shortness of breath. Patient states she woke this morning and was unable to breathe. She does not have a nebulizer at home and only uses inhalers, without any improvement in her symptoms. Patient states she also had dry cough. She denies any chest pain, ill contacts, recent travel, abdominal pain, nausea or vomiting, diarrhea or constipation. Patient does complain of lower extremity swelling but states that this is chronic. Patient has alternating bouts of fever and chills. She denies any ill contacts. She was recently hospitalized approximately one week ago for respiratory failure and urinary tract infection.  Pt seen for consult. BMI indicates obesity. Per chart review, pt consumed 50% of dinner on 12/31 and no other intakes documented since that time. She reports that for breakfast she had 1 sausage patty and 3 small pancakes and that for lunch she had collard greens, pinto beans, and a piece of cake. Pt reports that at home she typically eats cereal for breakfast, a sandwich for lunch, and a large meal for dinner. She denies any abdominal discomfort or nausea with eating since  admission or PTA. She denies any difficulties with chewing or swallowing. When RD asked pt if she becomes increasingly SOB with PO intakes she states no but later states that her breathing is bothering her and that she has trouble catching her breath at times when she is talking and eating.   Pt has hx of type 2 DM. She states that PCP informed her to check CBGs 3 times/week (on Mondays, Wednesdays, and Fridays) and to maintain CBGs ~120 mg/dL. Pt states that values vary and that the highest for her is 170-180 mg/dL. She does not follow any restricted diet at home and eats what she wants.   Physical assessment shows no muscle or fat wasting, mild edema present. Per chart review, weight has been stable/slight fluctuations since September 2017 (173-185 lbs). Most recently, pt gained 4 lbs from 04/22/16-06/01/16 and then gained another 3 lbs from 06/01/16-06/10/16.  Medications reviewed; sliding scale Novolog, daily multivitamin with minerals, PRN Zofran, 60 mg IV Solu-medrol QID, 30 mg oral Deltasone/day.  Labs reviewed; CBG: 136 mg/dL today, BUN: 40 mg/dL, creatinine: 2.27 mg/dL, Ca: 8.6 mg/dL, GFR: 19 mL/min.    Diet Order:  Diet heart healthy/carb modified Room service appropriate? Yes; Fluid consistency: Thin  Skin:   Stage 1 coccyx pressure injury  Last BM:  1/1  Height:   Ht Readings from Last 1 Encounters:  06/10/16 5\' 5"  (1.651 m)    Weight:   Wt Readings from Last 1 Encounters:  06/10/16 180 lb 8.9 oz (81.9 kg)    Ideal Body Weight:  56.82 kg  BMI:  Body mass index is 30.05 kg/m.  Estimated  Nutritional Needs:   Kcal:  1475-1640 (18-20 kcal/kg)  Protein:  74-82 grams (0.9-1 gram/kg)  Fluid:  1.4-1.6 L/day  EDUCATION NEEDS:   No education needs identified at this time    Jarome Matin, MS, RD, LDN, CNSC Inpatient Clinical Dietitian Pager # [phone] After hours/weekend pager # [phone]

## 2016-06-10 NOTE — Progress Notes (Signed)
Pharmacy Antibiotic Note  Debbie [name] is a 83 y.o. female admitted on 06/08/2016 with COPD exacerbation, bibasilar PNA (HCAP given recent hospitalization and flu +), acute on chronic diastolic CHF exacerbation.  Pharmacy has been consulted for levaquin dosing.  Patient has been receiving Zosyn therapy for 3 days now, therefore, will only need treatment with 4 more days of antibiotic therapy.  Plan:  Levaquin 500mg  PO q48h x 3 doses to complete 8 days total antibiotic therapy for HCAP  Continue to follow renal function and adjust dose/interval as needed  Follow up final culture data  Height: 5\' 5"  (165.1 cm) Weight: 180 lb 8.9 oz (81.9 kg) IBW/kg (Calculated) : 57  Temp (24hrs), Avg:98.3 F (36.8 C), Min:97.6 F (36.4 C), Max:98.9 F (37.2 C)   Recent Labs Lab 06/08/16 0751 06/08/16 0808 06/08/16 0813 06/09/16 0401 06/10/16 0325  WBC 10.8*  --   --  11.4* 13.6*  CREATININE 1.51* 1.70*  --  2.09* 2.27*  LATICACIDVEN  --   --  1.39  --   --     Estimated Creatinine Clearance: 19.9 mL/min (by C-G formula based on SCr of 2.27 mg/dL (H)).    Allergies  Allergen Reactions  . Codeine Other (See Comments)    Unusual mouth sensation    Antimicrobials this admission: 12/31 Vancomycin x 1 12/31 Zosyn >> 1/2 1/2 Levaquin >>  Dose adjustments this admission: ---  Microbiology results: 12/31 MRSA PCR: neg 12/31 Influenza A: POSITIVE 12/31 Influenza B: neg  Thank you for allowing pharmacy to be a part of this patient's care.  Peggyann Juba, PharmD, BCPS Pager: [phone] 06/10/2016 9:32 AM

## 2016-06-10 NOTE — Progress Notes (Signed)
Received from SDU, alert and oriented, no distress.Agree with previous RN's assessment.

## 2016-06-10 NOTE — Progress Notes (Addendum)
Patient ID: Debbie [name], female   DOB: 05-31-1931, 83 y.o.   MRN: [id number]    PROGRESS NOTE    [facility]  [id number] DOB: 01/02/1931 DOA: 06/08/2016  PCP: Myriam Jacobson, MD   Brief Narrative:  83 y.o. female with known hypertension, COPD on 2 L of home oxygen, diastolic heart failure, diabetes mellitus, presented to the emergency department with several days duration of progressively worsening dyspnea that initially started with exertion and has progressed to dyspnea at rest 24 hours prior to this admission. This was associated with subjective fevers, chills, poor oral intake, wheezing, mixed non productive and productive cough of clear sputum. She was recently hospitalized for acute diastolic CHF and UTI.   Assessment & Plan:   Acute on chronic hypoxic respiratory failure - Multifactorial including acute COPD exacerbation, bibasilar PNA (HCAP given recent hospitalization and flu +), acute on chronic diastolic CHF exacerbation  - pt is on 2 L oxygen via [place] at baseline  - pt reports feeling better this AM, sitting in chair, still with exertional dyspnea and now mostly productive cough  - will continue treatment with empiric ABX day #3/7 (will change Zosyn to PO Levaquin as pt able to take PO), also continue Tamiflu day #3/5 - stable for transfer to telemetry unit today   Acute COPD exacerbation - treat as noted above, BD's scheduled and as needed - added Mucinex - continue Prednisone and taper down by 10 mg daily until completed  - continue ABX and Tamiflu  - continue BD's scheduled and as needed   Acute on chronic diastolic heart failure - Echocardiogram 05/30/2016 showed an EF of 99991111, grade 2 diastolic dysfunction - Chest x-ray does show pleural effusions however no overt edema - Patient does have lower extremity edema which she states is chronic - diuretics were held initially due to low BP - home dose Torsemide resumed 06/09/2016 but SBP now down to  low 100's, Cr also continues trending up so will hold Torsemide again today 06/10/2016  Hypotension - SBP still on low end of normal, close monitoring  - amlodipine, atenolol, hydralazine, lisinopril all held since admission - only home torsemide resumed 1/1 but will need to hold again today as SBP in 100's and Cr up  Acute kidney injury imposed on Chronic kidney disease, stage III - Baseline creatinine approximately 2 - continues trending up from 1.7 --> 2.09 --> 2.27 - will hold Torsemide for now  - BMP in AM  Diabetes mellitus, type II with complications of nephropathy  - Hold glimepiride, metformin, tradjenta - SSI ordered for now   Hyperlipidemia - Continue statin  Chronic normocytic anemia - Baseline hemoglobin approximate 10, currently stable - Monitor CBC  Chronic diarrhea - at baseline per pt (from hx of radiation treatment endometrial adenocarcinoma)   Obesity  - Body mass index is 30.05 kg/m.   DVT prophylaxis: Lovenox SQ Code Status: Partial code, no CPR but OK to intubate if needed  Family Communication: Patient at bedside  Disposition Plan: SNF recommended, pt lives at home with her son and would like to go home when ready but she will think about SNF. For now can transfer out of SDU to tele unit.   Consultants:   None  Procedures:   None  Antimicrobials:   Vancomycin 12/31 x 1  Zosyn 12/31 --> 1/2  Levaquin 1/2 -->  Tamiflu 12/31 -->  Subjective: Pt reports feeling better, still with now mostly productive cough, less wheezing.   Objective:  Vitals:   06/10/16 0454 06/10/16 0738 06/10/16 0800 06/10/16 0900  BP:      Pulse:   (!) 103 88  Resp:   19 20  Temp:   98.3 F (36.8 C)   TempSrc:   Oral   SpO2:  91% 94% 95%  Weight: 81.9 kg (180 lb 8.9 oz)       Intake/Output Summary (Last 24 hours) at 06/10/16 0913 Last data filed at 06/10/16 0800  Gross per 24 hour  Intake            326.5 ml  Output              300 ml  Net              26.5 ml   Filed Weights   06/08/16 1200 06/09/16 0500 06/10/16 0454  Weight: 79.4 kg (175 lb 0.7 oz) 80.7 kg (177 lb 14.6 oz) 81.9 kg (180 lb 8.9 oz)    Examination:  General exam: Appears calm and comfortable  Respiratory system: Rales noted bilaterally with diminished breath sounds at bases Cardiovascular system: S1 & S2 heard, RRR. No JVD, rubs, gallops or clicks. Bilateral pitting edema.  Gastrointestinal system: Abdomen is nondistended, soft and nontender. No organomegaly or masses felt. Central nervous system: Alert and oriented. No focal neurological deficits. Extremities: Symmetric 5 x 5 power.  Data Reviewed: I have personally reviewed following labs and imaging studies  CBC:  Recent Labs Lab 06/08/16 0751 06/08/16 0808 06/09/16 0401 06/10/16 0325  WBC 10.8*  --  11.4* 13.6*  NEUTROABS 9.6*  --   --   --   HGB 9.8* 10.2* 9.6* 9.1*  HCT 30.8* 30.0* 29.4* 27.8*  MCV 94.5  --  93.0 92.7  PLT 327  --  303 [id number]   Basic Metabolic Panel:  Recent Labs Lab 06/08/16 0751 06/08/16 0808 06/09/16 0401 06/10/16 0325  NA 144 143 142 143  K 3.7 3.7 3.6 3.9  CL 103 103 103 104  CO2 30  --  29 30  GLUCOSE 174* 178* 180* 181*  BUN 24* 27* 31* 40*  CREATININE 1.51* 1.70* 2.09* 2.27*  CALCIUM 8.6*  --  8.6* 8.6*   Liver Function Tests:  Recent Labs Lab 06/08/16 0751  AST 22  ALT 21  ALKPHOS 64  BILITOT 0.7  PROT 6.1*  ALBUMIN 2.7*   Coagulation Profile:  Recent Labs Lab 06/08/16 0751  INR 0.96   CBG:  Recent Labs Lab 06/09/16 0751 06/09/16 1156 06/09/16 1731 06/09/16 2155 06/10/16 0738  GLUCAP 139* 248* 152* 213* 136*   Urine analysis:    Component Value Date/Time   COLORURINE YELLOW 05/28/2016 0848   APPEARANCEUR CLEAR 05/28/2016 0848   LABSPEC 1.008 05/28/2016 0848   PHURINE 6.0 05/28/2016 0848   GLUCOSEU 50 (A) 05/28/2016 0848   HGBUR NEGATIVE 05/28/2016 0848   BILIRUBINUR NEGATIVE 05/28/2016 0848   KETONESUR NEGATIVE 05/28/2016 0848     PROTEINUR 100 (A) 05/28/2016 0848   UROBILINOGEN 1.0 03/07/2014 1510   NITRITE POSITIVE (A) 05/28/2016 0848   LEUKOCYTESUR TRACE (A) 05/28/2016 0848    Recent Results (from the past 240 hour(s))  MRSA PCR Screening     Status: None   Collection Time: 06/08/16 11:41 AM  Result Value Ref Range Status   MRSA by PCR NEGATIVE NEGATIVE Final    Radiology Studies: No results found.  Scheduled Meds: . aspirin EC  81 mg Oral Daily  . atorvastatin  20 mg Oral Daily  .  enoxaparin (LOVENOX) injection  30 mg Subcutaneous Q24H  . insulin aspart  0-15 Units Subcutaneous TID WC  . insulin aspart  0-5 Units Subcutaneous QHS  . ipratropium-albuterol  3 mL Nebulization TID  . mometasone-formoterol  2 puff Inhalation BID  . multivitamin with minerals  1 tablet Oral Daily  . oseltamivir  30 mg Oral Daily  . piperacillin-tazobactam (ZOSYN)  IV  3.375 g Intravenous Q8H  . predniSONE  40 mg Oral Q supper  . sodium chloride flush  3 mL Intravenous Q12H  . torsemide  10 mg Oral Daily   Continuous Infusions:   LOS: 2 days   Time spent: 20 minutes   Faye Ramsay, MD [facility] Pager [phone]  If 7PM-7AM, please contact night-coverage [email or web address] Password [facility] 06/10/2016, 9:13 AM

## 2016-06-10 NOTE — Evaluation (Signed)
Occupational Therapy Evaluation Patient Details Name: Debbie [name] MRN: [id number] DOB: 1930/12/20 Today's Date: 06/10/2016    History of Present Illness 82 y.o. female with known hypertension, COPD on 2 L of home oxygen, diastolic heart failure, diabetes mellitus, recurrent endometrial and vaginal cancer s/p radiation 04/2016 presented to the emergency department with several days duration of progressively worsening dyspnea and admitted for acute on chronic hypoxic respiratory failure   Clinical Impression   Pt was admitted for the above.  She states that she was mod I with adls prior to admission and had a 24/7 caregiver. She will benefit from continued OT to increase safety and independence with adls. Goals in acute are for supervision to min A. She needs up to max A at this time.    Follow Up Recommendations  SNF;Supervision/Assistance - 24 hour (vs)    Equipment Recommendations  3 in 1 bedside commode    Recommendations for Other Services       Precautions / Restrictions Precautions Precautions: Fall Precaution Comments: chronic 2L O2 Restrictions Weight Bearing Restrictions: No      Mobility Bed Mobility               General bed mobility comments: oob  Transfers   Equipment used: Rolling walker (2 wheeled)   Sit to Stand: Min guard         General transfer comment: cues for safety/UE placement    Balance                                            ADL       Grooming: Set up;Sitting   Upper Body Bathing: Set up;Sitting   Lower Body Bathing: Moderate assistance;Sit to/from stand   Upper Body Dressing : Minimal assistance;Sitting   Lower Body Dressing: Maximal assistance;Sit to/from stand   Toilet Transfer: Min guard;Stand-pivot;BSC;Requires wide/bariatric             General ADL Comments: pt is on 2 liters of 02, which is her baseline. She states she can usually do ADLs but cannot reach to feet today.  Talked  about a raised toilet and shower seat to conserve energy.  VSS this session     Vision     Perception     Praxis      Pertinent Vitals/Pain Pain Assessment: No/denies pain     Hand Dominance Right   Extremity/Trunk Assessment Upper Extremity Assessment Upper Extremity Assessment: Generalized weakness           Communication Communication Communication: No difficulties   Cognition Arousal/Alertness: Awake/alert Behavior During Therapy: WFL for tasks assessed/performed Overall Cognitive Status: Within Functional Limits for tasks assessed                 General Comments: did need repeated cues for MMT, but otherwise WFLs   General Comments       Exercises       Shoulder Instructions      Home Living Family/patient expects to be discharged to:: Unsure Living Arrangements: Children Available Help at Discharge: Family;Personal care attendant;Available 24 hours/day               Bathroom Shower/Tub: Tub/shower unit Shower/tub characteristics: Architectural technologist: Standard     Home Equipment: Environmental consultant - standard   Additional Comments: does not have any bathroom DME      Prior Functioning/Environment  Level of Independence: Independent with assistive device(s)        Comments: SW for mobility. Independent with ADL.        OT Problem List: Decreased strength;Decreased activity tolerance;Impaired balance (sitting and/or standing);Decreased knowledge of use of DME or AE;Cardiopulmonary status limiting activity   OT Treatment/Interventions: Self-care/ADL training;Energy conservation;DME and/or AE instruction;Therapeutic activities;Patient/family education;Balance training    OT Goals(Current goals can be found in the care plan section) Acute Rehab OT Goals Patient Stated Goal: none stated OT Goal Formulation: With patient Time For Goal Achievement: 06/24/16 Potential to Achieve Goals: Good ADL Goals Pt Will Perform Grooming: with  supervision;standing Pt Will Perform Lower Body Dressing: with min assist;sit to/from stand Pt Will Transfer to Toilet: with supervision;ambulating;bedside commode Pt Will Perform Toileting - Clothing Manipulation and hygiene: with modified independence;sit to/from stand Pt Will Perform Tub/Shower Transfer: Tub transfer;tub bench;with supervision Additional ADL Goal #1: pt will initiate at least one rest break for energy conservation and verbalize 3 strategies  OT Frequency: Min 2X/week   Barriers to D/C:            Co-evaluation              End of Session    Activity Tolerance: Patient tolerated treatment well Patient left: in chair;with call bell/phone within reach;with chair alarm set   Time: [id number] OT Time Calculation (min): 12 min Charges:  OT General Charges $OT Visit: 1 Procedure OT Evaluation $OT Eval Moderate Complexity: 1 Procedure G-Codes:    Adell Panek Jun 17, 2016, 10:50 AM  Lesle Chris, OTR/L [phone] 06-17-2016

## 2016-06-11 DIAGNOSIS — I5032 Chronic diastolic (congestive) heart failure: Secondary | ICD-10-CM

## 2016-06-11 LAB — CBC
HCT: 28.3 % — ABNORMAL LOW (ref 36.0–46.0)
HEMOGLOBIN: 9.2 g/dL — AB (ref 12.0–15.0)
MCH: 30.2 pg (ref 26.0–34.0)
MCHC: 32.5 g/dL (ref 30.0–36.0)
MCV: 92.8 fL (ref 78.0–100.0)
PLATELETS: 282 10*3/uL (ref 150–400)
RBC: 3.05 MIL/uL — AB (ref 3.87–5.11)
RDW: 16 % — ABNORMAL HIGH (ref 11.5–15.5)
WBC: 11.8 10*3/uL — ABNORMAL HIGH (ref 4.0–10.5)

## 2016-06-11 LAB — BASIC METABOLIC PANEL
Anion gap: 9 (ref 5–15)
BUN: 56 mg/dL — AB (ref 6–20)
CHLORIDE: 103 mmol/L (ref 101–111)
CO2: 28 mmol/L (ref 22–32)
Calcium: 8.3 mg/dL — ABNORMAL LOW (ref 8.9–10.3)
Creatinine, Ser: 2.45 mg/dL — ABNORMAL HIGH (ref 0.44–1.00)
GFR calc Af Amer: 20 mL/min — ABNORMAL LOW (ref >60)
GFR calc non Af Amer: 17 mL/min — ABNORMAL LOW (ref >60)
GLUCOSE: 163 mg/dL — AB (ref 65–99)
Potassium: 4.5 mmol/L (ref 3.5–5.1)
Sodium: 140 mmol/L (ref 135–145)

## 2016-06-11 LAB — GLUCOSE, CAPILLARY
GLUCOSE-CAPILLARY: 219 mg/dL — AB (ref 65–99)
Glucose-Capillary: 136 mg/dL — ABNORMAL HIGH (ref 65–99)
Glucose-Capillary: 126 mg/dL — ABNORMAL HIGH (ref 65–99)
Glucose-Capillary: 114 mg/dL — ABNORMAL HIGH (ref 65–99)

## 2016-06-11 MED ORDER — GUAIFENESIN-DM 100-10 MG/5ML PO SYRP
5.0000 mL | ORAL_SOLUTION | ORAL | Status: DC | PRN
  Administered 2016-06-11: 5 mL via ORAL
  Filled 2016-06-11: qty 10

## 2016-06-11 MED ORDER — PREDNISONE 50 MG PO TABS
50.0000 mg | ORAL_TABLET | Freq: Every day | ORAL | Status: AC
  Administered 2016-06-11 – 2016-06-12 (×2): 50 mg via ORAL
  Filled 2016-06-11 (×2): qty 1

## 2016-06-11 NOTE — Progress Notes (Signed)
Physical Therapy Treatment Patient Details Name: Debbie [name] MRN: [id number] DOB: 1930/10/21 Today's Date: 06/11/2016    History of Present Illness 83 y.o. female with known hypertension, COPD on 2 L of home oxygen, diastolic heart failure, diabetes mellitus, recurrent endometrial and vaginal cancer s/p radiation 04/2016 presented to the emergency department with several days duration of progressively worsening dyspnea and admitted for acute on chronic hypoxic respiratory failure    PT Comments    Pt able to ambulate progress ambulation distance today.  Spo2 99% on 2L O2 [place] at rest, decreased to 87% on 2L during ambulation, however improved back to 93% quickly upon sitting in recliner.  Son and pt plan for d/c to SNF for rehab.   Follow Up Recommendations  SNF     Equipment Recommendations  Rolling walker with 5" wheels    Recommendations for Other Services       Precautions / Restrictions Precautions Precautions: Fall Precaution Comments: chronic 2L O2    Mobility  Bed Mobility Overal bed mobility: Needs Assistance Bed Mobility: Supine to Sit;Sit to Supine     Supine to sit: Supervision;HOB elevated     General bed mobility comments: heavy use of rail to pull up  Transfers Overall transfer level: Needs assistance Equipment used: Rolling walker (2 wheeled) Transfers: Sit to/from Stand Sit to Stand: Min assist         General transfer comment: verbal cues for technique, assist to rise and steady  Ambulation/Gait Ambulation/Gait assistance: Min guard Ambulation Distance (Feet): 120 Feet Assistive device: Rolling walker (2 wheeled) Gait Pattern/deviations: Step-through pattern;Decreased stride length;Trunk flexed     General Gait Details: SpO2 down to 87% end of ambulation on 2L O2 [place], distance to pt tolerance   Stairs            Wheelchair Mobility    Modified Rankin (Stroke Patients Only)       Balance                                    Cognition Arousal/Alertness: Awake/alert Behavior During Therapy: WFL for tasks assessed/performed Overall Cognitive Status: Within Functional Limits for tasks assessed                      Exercises      General Comments        Pertinent Vitals/Pain Pain Assessment: No/denies pain  RT in to room end of session.    Home Living                      Prior Function            PT Goals (current goals can now be found in the care plan section) Progress towards PT goals: Progressing toward goals    Frequency    Min 3X/week      PT Plan Current plan remains appropriate    Co-evaluation             End of Session Equipment Utilized During Treatment: Gait belt;Oxygen Activity Tolerance: Patient limited by fatigue Patient left: with call bell/phone within reach;with family/visitor present;in chair;with chair alarm set     Time: [id number] PT Time Calculation (min) (ACUTE ONLY): 12 min  Charges:  $Gait Training: 8-22 mins                    G Codes:  Debbie [name],Debbie [name] 06/11/2016, 2:39 PM Debbie [name], PT, DPT 06/11/2016 Pager: [phone]

## 2016-06-11 NOTE — Progress Notes (Signed)
[facility] PROGRESS NOTE    Progress Note  Debbie [name]  [id number] DOB: [birth year] DOA: 06/08/2016 PCP: Debbie Jacobson, MD     Brief Narrative:   Debbie [name] is an 83 y.o. female hypertension, COPD on 2 L of home oxygen, diastolic heart failure, diabetes mellitus, presented to the emergency department with several days duration of progressively worsening dyspnea that initially started with exertion and has progressed to dyspnea at rest 24 hours prior to this admission  Assessment/Plan:   Acute on chronic hypoxic respiratory failure with hypoxia: Multifactorial likely due to COPD exacerbation due to possible healthcare associated pneumonia and influenza. Continue patient on nasal cannula. Oral Levaquin and Tamiflu.  Acute COPD exacerbation: Continue inhalers. She is wheezing on physical exam,  check saturations with ambulation's in am Physical therapy value the patient and recommended skilled nursing facility.  Acute on chronic diastolic heart failure: ECHO on 05/30/2016 that showed [id number] to diastolic heart failure. His echo showed no wall motion. Hold dose torsemide, she does have worsening creatinine  Acute kidney injury is on chronic kidney disease stage III: Baseline creatinine of 2 trending up likely due to overdiuresis continue to hold torsemide.  Hyperlipidemia: Cont statins.  Normocytic anemia: Hemoglobin at baseline.   DVT prophylaxis: lovenox Family Communication:none Disposition Plan/Barrier to D/C: SNF in 2-3 days Code Status:     Code Status Orders        Start     Ordered   06/08/16 1210  Limited resuscitation (code)  Continuous    Question Answer Comment  In the event of cardiac or respiratory ARREST: Initiate Code Blue, Call Rapid Response Yes   In the event of cardiac or respiratory ARREST: Perform CPR No   In the event of cardiac or respiratory ARREST: Perform Intubation/Mechanical Ventilation Yes   In the event of  cardiac or respiratory ARREST: Use NIPPV/BiPAp only if indicated Yes   In the event of cardiac or respiratory ARREST: Administer ACLS medications if indicated Yes   In the event of cardiac or respiratory ARREST: Perform Defibrillation or Cardioversion if indicated Yes      06/08/16 1210    Code Status History    Date Active Date Inactive Code Status Order ID Comments User Context   05/28/2016 10:45 AM 06/02/2016  4:34 PM Full Code [id number]  Rondel Jumbo, PA-C ED   03/17/2014  5:39 PM 03/18/2014  2:33 PM Full Code [id number]  Ulyses Amor, PA-C Inpatient   01/12/2014 12:20 PM 01/13/2014  2:35 PM Full Code [id number]  Alvia Grove, PA-C Inpatient        IV Access:    Peripheral IV   Procedures and diagnostic studies:   No results found.   Medical Consultants:    None.  Anti-Infectives:   None  Subjective:    Debbie [name] she relates her pain is unchanged compared to yesterday.  Objective:    Vitals:   06/10/16 2111 06/11/16 0500 06/11/16 0604 06/11/16 0925  BP: 117/71  122/61   Pulse: 83  78   Resp: 18  18   Temp: 98.4 F (36.9 C)  98.3 F (36.8 C)   TempSrc: Oral  Oral   SpO2: 97%  98% 96%  Weight:  81.5 kg (179 lb 10.8 oz)    Height:        Intake/Output Summary (Last 24 hours) at 06/11/16 0945 Last data filed at 06/10/16 1004  Gross per 24 hour  Intake  233 ml  Output              450 ml  Net             -217 ml   Filed Weights   06/10/16 0454 06/10/16 1430 06/11/16 0500  Weight: 81.9 kg (180 lb 8.9 oz) 82 kg (180 lb 12.4 oz) 81.5 kg (179 lb 10.8 oz)    Exam: General exam: In no acute distress. Respiratory system: Good air movement and clear to auscultation. Cardiovascular system: S1 & S2 heard, RRR. No JVD, murmurs, rubs, gallops or clicks.  Gastrointestinal system: Abdomen is nondistended, soft and nontender.  Central nervous system: Alert and oriented. No focal neurological deficits. Extremities: No pedal edema. Skin:  No rashes, lesions or ulcers Psychiatry: Judgement and insight appear normal. Mood & affect appropriate.    Data Reviewed:    Labs: Basic Metabolic Panel:  Recent Labs Lab 06/08/16 0751 06/08/16 0808 06/09/16 0401 06/10/16 0325 06/11/16 0526  NA 144 143 142 143 140  K 3.7 3.7 3.6 3.9 4.5  CL 103 103 103 104 103  CO2 30  --  29 30 28   GLUCOSE 174* 178* 180* 181* 163*  BUN 24* 27* 31* 40* 56*  CREATININE 1.51* 1.70* 2.09* 2.27* 2.45*  CALCIUM 8.6*  --  8.6* 8.6* 8.3*   GFR Estimated Creatinine Clearance: 18.5 mL/min (by C-G formula based on SCr of 2.45 mg/dL (H)). Liver Function Tests:  Recent Labs Lab 06/08/16 0751  AST 22  ALT 21  ALKPHOS 64  BILITOT 0.7  PROT 6.1*  ALBUMIN 2.7*   No results for input(s): LIPASE, AMYLASE in the last 168 hours. No results for input(s): AMMONIA in the last 168 hours. Coagulation profile  Recent Labs Lab 06/08/16 0751  INR 0.96    CBC:  Recent Labs Lab 06/08/16 0751 06/08/16 0808 06/09/16 0401 06/10/16 0325 06/11/16 0526  WBC 10.8*  --  11.4* 13.6* 11.8*  NEUTROABS 9.6*  --   --   --   --   HGB 9.8* 10.2* 9.6* 9.1* 9.2*  HCT 30.8* 30.0* 29.4* 27.8* 28.3*  MCV 94.5  --  93.0 92.7 92.8  PLT 327  --  303 288 282   Cardiac Enzymes: No results for input(s): CKTOTAL, CKMB, CKMBINDEX, TROPONINI in the last 168 hours. BNP (last 3 results) No results for input(s): PROBNP in the last 8760 hours. CBG:  Recent Labs Lab 06/10/16 0738 06/10/16 1116 06/10/16 1740 06/10/16 2121 06/11/16 0749  GLUCAP 136* 260* 128* 143* 136*   D-Dimer: No results for input(s): DDIMER in the last 72 hours. Hgb A1c: No results for input(s): HGBA1C in the last 72 hours. Lipid Profile: No results for input(s): CHOL, HDL, LDLCALC, TRIG, CHOLHDL, LDLDIRECT in the last 72 hours. Thyroid function studies: No results for input(s): TSH, T4TOTAL, T3FREE, THYROIDAB in the last 72 hours.  Invalid input(s): FREET3 Anemia work up: No results  for input(s): VITAMINB12, FOLATE, FERRITIN, TIBC, IRON, RETICCTPCT in the last 72 hours. Sepsis Labs:  Recent Labs Lab 06/08/16 0751 06/08/16 0813 06/09/16 0401 06/10/16 0325 06/11/16 0526  WBC 10.8*  --  11.4* 13.6* 11.8*  LATICACIDVEN  --  1.39  --   --   --    Microbiology Recent Results (from the past 240 hour(s))  MRSA PCR Screening     Status: None   Collection Time: 06/08/16 11:41 AM  Result Value Ref Range Status   MRSA by PCR NEGATIVE NEGATIVE Final    Comment:  The GeneXpert MRSA Assay (FDA approved for NASAL specimens only), is one component of a comprehensive MRSA colonization surveillance program. It is not intended to diagnose MRSA infection nor to guide or monitor treatment for MRSA infections.      Medications:   . aspirin EC  81 mg Oral Daily  . atorvastatin  20 mg Oral Daily  . enoxaparin (LOVENOX) injection  30 mg Subcutaneous Q24H  . insulin aspart  0-15 Units Subcutaneous TID WC  . insulin aspart  0-5 Units Subcutaneous QHS  . ipratropium-albuterol  3 mL Nebulization TID  . levofloxacin  500 mg Oral Q48H  . mometasone-formoterol  2 puff Inhalation BID  . multivitamin with minerals  1 tablet Oral Daily  . oseltamivir  30 mg Oral Daily  . predniSONE  30 mg Oral Q supper  . protein supplement shake  11 oz Oral Q24H  . sodium chloride flush  3 mL Intravenous Q12H   Continuous Infusions:  Time spent: 25 min   LOS: 3 days   Charlynne Cousins  [facility] Pager [phone]  *Please refer to [email or web address], password [email or web address] to get updated schedule on who will round on this patient, as hospitalists switch teams weekly. If 7PM-7AM, please contact night-coverage at [email or web address], password [email or web address] for any overnight needs.  06/11/2016, 9:45 AM

## 2016-06-11 NOTE — Clinical Social Work Note (Signed)
MSW attempted to contact patient's son, Belenda Cruise in regards to discharge planning.  MSW remains available as needed.   Glendon Axe, MSW [phone] 06/11/2016 11:09 AM

## 2016-06-11 NOTE — NC FL2 (Signed)
[place] LEVEL OF CARE SCREENING TOOL     IDENTIFICATION  Patient Name: Debbie [name] Birthdate: Nov 08, 1930 Sex: female Admission Date (Current Location): 06/08/2016  [facility] and [place] Number:  Herbalist and Address:  [facility],  [place], [place]      Provider Number: [id number]  Attending Physician Name and Address:  Charlynne Cousins, MD  Relative Name and Phone Number:       Current Level of Care: Hospital Recommended Level of Care: [place] Prior Approval Number:    Date Approved/Denied:   PASRR Number:  ([id number])  Discharge Plan: SNF    Current Diagnoses: Patient Active Problem List   Diagnosis Date Noted  . COPD exacerbation ([place]) 06/08/2016  . Diastolic heart failure ([place]) 06/08/2016  . Acute respiratory failure with hypoxia ([place])   . COPD with hypoxia ([place]) 06/02/2016  . Pressure injury of skin 05/29/2016  . Respiratory distress 05/29/2016  . UTI (urinary tract infection) 05/28/2016  . Hypoglycemia 05/28/2016  . Diabetes ([place]) 05/28/2016  . Anxiety 05/28/2016  . Hypertension 05/28/2016  . Diabetes mellitus with complication ([place])   . Renal insufficiency   . Urinary tract infection without hematuria   . 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 Carotid stenosis, asymptomatic 11/28/2014  . Aftercare following surgery of the circulatory system 04/11/2014  . Aftercare following surgery of the circulatory system, [place] 01/31/2014  . Carotid stenosis 01/10/2014  . Occlusion and stenosis of carotid artery without mention of cerebral infarction 01/10/2014  . Malignant neoplasm of corpus uteri, except isthmus ([place]) 07/07/2011    Orientation RESPIRATION BLADDER Height & Weight     Self, Time, Situation, Place  O2 (at 2L) Continent Weight: 179 lb 10.8 oz (81.5 kg) Height:  5' 5.5" (166.4 cm)  BEHAVIORAL SYMPTOMS/MOOD NEUROLOGICAL BOWEL NUTRITION STATUS   (none )  (none ) Continent Diet (Heart Healthy/Carb Modified )  AMBULATORY STATUS COMMUNICATION OF NEEDS Skin   Limited Assist Verbally PU Stage and Appropriate Care PU Stage 1 Dressing: No Dressing                     Personal Care Assistance Level of Assistance  Dressing, Feeding, Bathing Bathing Assistance: Limited assistance Feeding assistance: Independent Dressing Assistance: Maximum assistance     Functional Limitations Info  Speech, Hearing, Sight Sight Info: Impaired Hearing Info: Adequate Speech Info: Adequate    SPECIAL CARE FACTORS FREQUENCY  PT (By licensed PT), OT (By licensed OT)     PT Frequency: 3 OT Frequency: 2            Contractures      Additional Factors Info  Code Status, Allergies Code Status Info: Partial Code Allergies Info: Codeine           Current Medications (06/11/2016):  This is the current hospital active medication list Current Facility-Administered Medications  Medication Dose Route Frequency Provider Last Rate Last Dose  . 0.9 %  sodium chloride infusion  250 mL Intravenous PRN Maryann Mikhail, DO   Stopped at 06/10/16 0800  . acetaminophen (TYLENOL) tablet 650 mg  650 mg Oral Q6H PRN Maryann Mikhail, DO       Or  . acetaminophen (TYLENOL) suppository 650 mg  650 mg Rectal Q6H PRN Maryann Mikhail, DO      . aspirin EC tablet 81 mg  81 mg Oral Daily Maryann Mikhail, DO   81 mg at 06/11/16 [id number]  .  atorvastatin (LIPITOR) tablet 20 mg  20 mg Oral Daily Maryann Mikhail, DO   20 mg at 06/11/16 [id number]  . chlorpheniramine-HYDROcodone (TUSSIONEX) 10-8 MG/5ML suspension 5 mL  5 mL Oral QHS PRN Gardiner Barefoot, NP   5 mL at 06/09/16 2333  . enoxaparin (LOVENOX) injection 30 mg  30 mg Subcutaneous Q24H Maryann Mikhail, DO   30 mg at 06/10/16 2130  . guaiFENesin-dextromethorphan (ROBITUSSIN DM) 100-10 MG/5ML syrup 5 mL  5 mL Oral Q4H PRN Theodis Blaze, MD   5 mL at 06/11/16 0552  . insulin aspart (novoLOG) injection 0-15 Units  0-15  Units Subcutaneous TID WC Maryann Mikhail, DO   2 Units at 06/11/16 1249  . insulin aspart (novoLOG) injection 0-5 Units  0-5 Units Subcutaneous QHS Maryann Mikhail, DO   2 Units at 06/09/16 2204  . ipratropium-albuterol (DUONEB) 0.5-2.5 (3) MG/3ML nebulizer solution 3 mL  3 mL Nebulization Q4H PRN Theodis Blaze, MD      . ipratropium-albuterol (DUONEB) 0.5-2.5 (3) MG/3ML nebulizer solution 3 mL  3 mL Nebulization TID Theodis Blaze, MD   3 mL at 06/11/16 1339  . levofloxacin (LEVAQUIN) tablet 500 mg  500 mg Oral Q48H Emiliano Dyer, RPH   500 mg at 06/10/16 1221  . mometasone-formoterol (DULERA) 100-5 MCG/ACT inhaler 2 puff  2 puff Inhalation BID Maryann Mikhail, DO   2 puff at 06/11/16 0925  . multivitamin with minerals tablet 1 tablet  1 tablet Oral Daily Maryann Mikhail, DO   1 tablet at 06/11/16 [id number]  . ondansetron (ZOFRAN) tablet 4 mg  4 mg Oral Q6H PRN Maryann Mikhail, DO       Or  . ondansetron (ZOFRAN) injection 4 mg  4 mg Intravenous Q6H PRN Maryann Mikhail, DO      . oseltamivir (TAMIFLU) capsule 30 mg  30 mg Oral Daily Maryann Mikhail, DO   30 mg at 06/11/16 [id number]  . predniSONE (DELTASONE) tablet 50 mg  50 mg Oral Q supper Charlynne Cousins, MD      . protein supplement (PREMIER PROTEIN) liquid  11 oz Oral Q24H Theodis Blaze, MD   11 oz at 06/10/16 1600  . sodium chloride (OCEAN) 0.65 % nasal spray 1 spray  1 spray Each Nare PRN Maryann Mikhail, DO      . sodium chloride flush (NS) 0.9 % injection 3 mL  3 mL Intravenous Q12H Maryann Mikhail, DO   3 mL at 06/11/16 1000  . sodium chloride flush (NS) 0.9 % injection 3 mL  3 mL Intravenous PRN Cristal Ford, DO         Discharge Medications: Please see discharge summary for a list of discharge medications.  Relevant Imaging Results:  Relevant Lab Results:   Additional Information SSN [id number]  Rozell Searing

## 2016-06-12 DIAGNOSIS — J1100 Influenza due to unidentified influenza virus with unspecified type of pneumonia: Secondary | ICD-10-CM

## 2016-06-12 LAB — BASIC METABOLIC PANEL
Anion gap: 8 (ref 5–15)
BUN: 56 mg/dL — ABNORMAL HIGH (ref 6–20)
CO2: 30 mmol/L (ref 22–32)
CREATININE: 2.2 mg/dL — AB (ref 0.44–1.00)
Calcium: 8.5 mg/dL — ABNORMAL LOW (ref 8.9–10.3)
Chloride: 103 mmol/L (ref 101–111)
GFR calc non Af Amer: 20 mL/min — ABNORMAL LOW (ref >60)
GFR, EST AFRICAN AMERICAN: 23 mL/min — AB (ref >60)
Glucose, Bld: 139 mg/dL — ABNORMAL HIGH (ref 65–99)
POTASSIUM: 4 mmol/L (ref 3.5–5.1)
Sodium: 141 mmol/L (ref 135–145)

## 2016-06-12 LAB — GLUCOSE, CAPILLARY
GLUCOSE-CAPILLARY: 80 mg/dL (ref 65–99)
Glucose-Capillary: 152 mg/dL — ABNORMAL HIGH (ref 65–99)
Glucose-Capillary: 129 mg/dL — ABNORMAL HIGH (ref 65–99)
Glucose-Capillary: 222 mg/dL — ABNORMAL HIGH (ref 65–99)

## 2016-06-12 MED ORDER — DIPHENHYDRAMINE HCL 12.5 MG/5ML PO ELIX: 12.5000 mg | ORAL_SOLUTION | Freq: Once | ORAL | Status: DC

## 2016-06-12 NOTE — Progress Notes (Signed)
[facility] PROGRESS NOTE    Progress Note  Debbie [name]  [id number] DOB: [birth year] DOA: 06/08/2016 PCP: Myriam Jacobson, MD     Brief Narrative:   Debbie [name] is an 83 y.o. female hypertension, COPD on 2 L of home oxygen, diastolic heart failure, diabetes mellitus, presented to the emergency department with several days duration of progressively worsening dyspnea that initially started with exertion and has progressed to dyspnea at rest 24 hours prior to this admission  Assessment/Plan:   Acute on chronic hypoxic respiratory failure with hypoxia: Multifactorial likely due to COPD exacerbation due to possible healthcare associated pneumonia and influenza. Continue patient on nasal cannula. Oral Levaquin and Tamiflu. Awaiting saturations with ambulation.  Acute COPD exacerbation: Continue inhalers. Improve wheezing on physical exam. Physical therapy value the patient and recommended skilled nursing facility.  Acute on chronic diastolic heart failure: ECHO on 05/30/2016 that showed [id number] to diastolic heart failure. His echo showed no wall motion. Hold dose torsemide, she does have worsening creatinine  Acute kidney injury is on chronic kidney disease stage III: Cont to hold torsemide.  Hyperlipidemia: Cont statins.  Normocytic anemia: Hemoglobin at baseline.   DVT prophylaxis: lovenox Family Communication:none Disposition Plan/Barrier to D/C: SNF in 2-3 days Code Status:     Code Status Orders        Start     Ordered   06/08/16 1210  Limited resuscitation (code)  Continuous    Question Answer Comment  In the event of cardiac or respiratory ARREST: Initiate Code Blue, Call Rapid Response Yes   In the event of cardiac or respiratory ARREST: Perform CPR No   In the event of cardiac or respiratory ARREST: Perform Intubation/Mechanical Ventilation Yes   In the event of cardiac or respiratory ARREST: Use NIPPV/BiPAp only if indicated Yes     In the event of cardiac or respiratory ARREST: Administer ACLS medications if indicated Yes   In the event of cardiac or respiratory ARREST: Perform Defibrillation or Cardioversion if indicated Yes      06/08/16 1210    Code Status History    Date Active Date Inactive Code Status Order ID Comments User Context   05/28/2016 10:45 AM 06/02/2016  4:34 PM Full Code [id number]  Rondel Jumbo, PA-C ED   03/17/2014  5:39 PM 03/18/2014  2:33 PM Full Code [id number]  Ulyses Amor, PA-C Inpatient   01/12/2014 12:20 PM 01/13/2014  2:35 PM Full Code [id number]  Alvia Grove, PA-C Inpatient        IV Access:    Peripheral IV   Procedures and diagnostic studies:   No results found.   Medical Consultants:    None.  Anti-Infectives:   None  Subjective:    Debbie [name] she relates her SOB is improved.  Objective:    Vitals:   06/11/16 2042 06/11/16 2127 06/12/16 0545 06/12/16 0930  BP:  (!) 139/94 (!) 153/89   Pulse:  (!) 113 100 75  Resp:  (!) 24 (!) 22 18  Temp:  98.4 F (36.9 C) 99.2 F (37.3 C)   TempSrc:  Oral Oral   SpO2: 94% 91% 92% 92%  Weight:   81.6 kg (179 lb 14.3 oz)   Height:        Intake/Output Summary (Last 24 hours) at 06/12/16 1129 Last data filed at 06/11/16 2230  Gross per 24 hour  Intake  3 ml  Output                0 ml  Net                3 ml   Filed Weights   06/10/16 1430 06/11/16 0500 06/12/16 0545  Weight: 82 kg (180 lb 12.4 oz) 81.5 kg (179 lb 10.8 oz) 81.6 kg (179 lb 14.3 oz)    Exam: General exam: In no acute distress. Respiratory system: Good air movement and clear to auscultation. Cardiovascular system: S1 & S2 heard, RRR. No JVD. Gastrointestinal system: Abdomen is nondistended, soft and nontender.  Extremities: No pedal edema. Skin: No rashes, lesions or ulcers Psychiatry: Judgement and insight appear normal. Mood & affect appropriate.    Data Reviewed:    Labs: Basic Metabolic Panel:  Recent  Labs Lab 06/08/16 0751 06/08/16 0808 06/09/16 0401 06/10/16 0325 06/11/16 0526  NA 144 143 142 143 140  K 3.7 3.7 3.6 3.9 4.5  CL 103 103 103 104 103  CO2 30  --  29 30 28   GLUCOSE 174* 178* 180* 181* 163*  BUN 24* 27* 31* 40* 56*  CREATININE 1.51* 1.70* 2.09* 2.27* 2.45*  CALCIUM 8.6*  --  8.6* 8.6* 8.3*   GFR Estimated Creatinine Clearance: 18.6 mL/min (by C-G formula based on SCr of 2.45 mg/dL (H)). Liver Function Tests:  Recent Labs Lab 06/08/16 0751  AST 22  ALT 21  ALKPHOS 64  BILITOT 0.7  PROT 6.1*  ALBUMIN 2.7*   No results for input(s): LIPASE, AMYLASE in the last 168 hours. No results for input(s): AMMONIA in the last 168 hours. Coagulation profile  Recent Labs Lab 06/08/16 0751  INR 0.96    CBC:  Recent Labs Lab 06/08/16 0751 06/08/16 0808 06/09/16 0401 06/10/16 0325 06/11/16 0526  WBC 10.8*  --  11.4* 13.6* 11.8*  NEUTROABS 9.6*  --   --   --   --   HGB 9.8* 10.2* 9.6* 9.1* 9.2*  HCT 30.8* 30.0* 29.4* 27.8* 28.3*  MCV 94.5  --  93.0 92.7 92.8  PLT 327  --  303 288 282   Cardiac Enzymes: No results for input(s): CKTOTAL, CKMB, CKMBINDEX, TROPONINI in the last 168 hours. BNP (last 3 results) No results for input(s): PROBNP in the last 8760 hours. CBG:  Recent Labs Lab 06/11/16 0749 06/11/16 1225 06/11/16 1653 06/11/16 2143 06/12/16 0802  GLUCAP 136* 126* 114* 219* 152*   D-Dimer: No results for input(s): DDIMER in the last 72 hours. Hgb A1c: No results for input(s): HGBA1C in the last 72 hours. Lipid Profile: No results for input(s): CHOL, HDL, LDLCALC, TRIG, CHOLHDL, LDLDIRECT in the last 72 hours. Thyroid function studies: No results for input(s): TSH, T4TOTAL, T3FREE, THYROIDAB in the last 72 hours.  Invalid input(s): FREET3 Anemia work up: No results for input(s): VITAMINB12, FOLATE, FERRITIN, TIBC, IRON, RETICCTPCT in the last 72 hours. Sepsis Labs:  Recent Labs Lab 06/08/16 0751 06/08/16 0813 06/09/16 0401  06/10/16 0325 06/11/16 0526  WBC 10.8*  --  11.4* 13.6* 11.8*  LATICACIDVEN  --  1.39  --   --   --    Microbiology Recent Results (from the past 240 hour(s))  MRSA PCR Screening     Status: None   Collection Time: 06/08/16 11:41 AM  Result Value Ref Range Status   MRSA by PCR NEGATIVE NEGATIVE Final    Comment:        The GeneXpert MRSA Assay (FDA  approved for NASAL specimens only), is one component of a comprehensive MRSA colonization surveillance program. It is not intended to diagnose MRSA infection nor to guide or monitor treatment for MRSA infections.      Medications:   . aspirin EC  81 mg Oral Daily  . atorvastatin  20 mg Oral Daily  . enoxaparin (LOVENOX) injection  30 mg Subcutaneous Q24H  . insulin aspart  0-15 Units Subcutaneous TID WC  . insulin aspart  0-5 Units Subcutaneous QHS  . ipratropium-albuterol  3 mL Nebulization TID  . levofloxacin  500 mg Oral Q48H  . mometasone-formoterol  2 puff Inhalation BID  . multivitamin with minerals  1 tablet Oral Daily  . predniSONE  50 mg Oral Q supper  . protein supplement shake  11 oz Oral Q24H  . sodium chloride flush  3 mL Intravenous Q12H   Continuous Infusions:  Time spent: 25 min   LOS: 4 days   Charlynne Cousins  [facility] Pager [phone]  *Please refer to [email or web address], password [email or web address] to get updated schedule on who will round on this patient, as hospitalists switch teams weekly. If 7PM-7AM, please contact night-coverage at [email or web address], password [email or web address] for any overnight needs.  06/12/2016, 11:29 AM

## 2016-06-12 NOTE — Clinical Social Work Note (Signed)
MSW attempted to contact patient's son, Belenda Cruise with contact information on chart. Shadow chart does not have additional contact information.   MSW remains available as needed.  Glendon Axe, MSW [phone] 06/12/2016 10:46 AM

## 2016-06-12 NOTE — Progress Notes (Signed)
SATURATION QUALIFICATIONS:  Patient Saturations on Room Air at Rest = 88 %  Patient Saturations on Room Air while Ambulating = 85 %  Patient Saturations on 2 Liters of oxygen while Ambulating = 90 %  Please briefly explain why patient needs home oxygen patient immediately became SOB, saturation decrease 86% after walking 2 ft. Oxygen reapplied at 2 liters, saturation increased 88% while out of bed. Pt returned to bed to rest, saturation increased 90%. Will continue to monitor.

## 2016-06-12 NOTE — Care Management Important Message (Signed)
Important Message  Patient Details  Name: UNDINE VANEATON MRN: [id number] Date of Birth: [birth year]   Medicare Important Message Given:  Yes    Kerin Salen 06/12/2016, 10:40 AMImportant Message  Patient Details  Name: GENIENE ESCOBEDO MRN: [id number] Date of Birth: Oct 04, 1930   Medicare Important Message Given:  Yes    Kerin Salen 06/12/2016, 10:40 AM

## 2016-06-12 NOTE — Plan of Care (Signed)
Problem: Activity: Goal: Risk for activity intolerance will decrease Outcome: Progressing Pt is oob 1 assist, walker. Pt is generally weak

## 2016-06-13 DIAGNOSIS — J159 Unspecified bacterial pneumonia: Secondary | ICD-10-CM

## 2016-06-13 LAB — GLUCOSE, CAPILLARY
Glucose-Capillary: 139 mg/dL — ABNORMAL HIGH (ref 65–99)
Glucose-Capillary: 145 mg/dL — ABNORMAL HIGH (ref 65–99)
Glucose-Capillary: 89 mg/dL (ref 65–99)

## 2016-06-13 LAB — BASIC METABOLIC PANEL
Anion gap: 10 (ref 5–15)
BUN: 52 mg/dL — AB (ref 6–20)
CO2: 29 mmol/L (ref 22–32)
Calcium: 8.5 mg/dL — ABNORMAL LOW (ref 8.9–10.3)
Chloride: 106 mmol/L (ref 101–111)
Creatinine, Ser: 2.14 mg/dL — ABNORMAL HIGH (ref 0.44–1.00)
GFR calc Af Amer: 23 mL/min — ABNORMAL LOW (ref >60)
GFR, EST NON AFRICAN AMERICAN: 20 mL/min — AB (ref >60)
GLUCOSE: 179 mg/dL — AB (ref 65–99)
POTASSIUM: 4.7 mmol/L (ref 3.5–5.1)
Sodium: 145 mmol/L (ref 135–145)

## 2016-06-13 MED ORDER — HYDRALAZINE HCL 25 MG PO TABS: 25.0000 mg | ORAL_TABLET | Freq: Three times a day (TID) | ORAL | Status: DC

## 2016-06-13 MED ORDER — AMLODIPINE BESYLATE 10 MG PO TABS
10.0000 mg | ORAL_TABLET | Freq: Every day | ORAL | 0 refills | Status: DC
Start: 2016-06-16 — End: 2016-07-19

## 2016-06-13 MED ORDER — AMLODIPINE BESYLATE 10 MG PO TABS: 10.0000 mg | ORAL_TABLET | Freq: Every day | ORAL | 0 refills | Status: DC

## 2016-06-13 MED ORDER — LISINOPRIL 2.5 MG PO TABS: 2.5000 mg | ORAL_TABLET | Freq: Every day | ORAL | Status: DC

## 2016-06-13 MED ORDER — TORSEMIDE 10 MG PO TABS
10.0000 mg | ORAL_TABLET | Freq: Every day | ORAL | 0 refills | Status: DC
Start: 2016-06-18 — End: 2016-06-25

## 2016-06-13 MED ORDER — LEVOFLOXACIN 500 MG PO TABS: 500.0000 mg | ORAL_TABLET | ORAL | 0 refills | Status: DC

## 2016-06-13 MED ORDER — PREDNISONE 10 MG PO TABS: ORAL_TABLET | ORAL | 0 refills | Status: DC

## 2016-06-13 MED ORDER — TORSEMIDE 10 MG PO TABS: 10.0000 mg | ORAL_TABLET | Freq: Every day | ORAL | 0 refills | Status: DC

## 2016-06-13 MED ORDER — METFORMIN HCL 1000 MG PO TABS: 1000.0000 mg | ORAL_TABLET | Freq: Every day | ORAL | Status: DC

## 2016-06-13 NOTE — Progress Notes (Signed)
Discharged to [facility] SNF, called report 3x I was placed on hold but unable to talk to any RNs, the line was cut off.

## 2016-06-13 NOTE — Clinical Social Work Placement (Signed)
Currently awaiting [facility] insurance authorization for [facility].   Medical Social Worker facilitated patient discharge including contacting patient family and facility to confirm patient discharge plans.  Clinical information faxed to facility and family agreeable with plan.  MSW arranged ambulance transport via [place] to [facility].  RN to call report prior to discharge [phone].  Medical Social Worker will sign off for now as social work intervention is no longer needed. Please consult [place] again if new need arises.  CLINICAL SOCIAL WORK PLACEMENT  NOTE  Date:  06/13/2016  Patient Details  Name: Debbie [name] MRN: [id number] Date of Birth: 08-Jan-1931  Clinical Social Work is seeking post-discharge placement for this patient at the [place] level of care (*CSW will initial, date and re-position this form in  chart as items are completed):  Yes   Patient/family provided with [place] Work Department's list of facilities offering this level of care within the geographic area requested by the patient (or if unable, by the patient's family).  Yes   Patient/family informed of their freedom to choose among providers that offer the needed level of care, that participate in Medicare, Medicaid or managed care program needed by the patient, have an available bed and are willing to accept the patient.  Yes   Patient/family informed of [place]'s ownership interest in [facility] and [facility], as well as of the fact that they are under no obligation to receive care at these facilities.  PASRR submitted to EDS on 06/13/16     PASRR number received on 06/13/16     Existing PASRR number confirmed on       FL2 transmitted to all facilities in geographic area requested by pt/family on 06/13/16     FL2 transmitted to all facilities within larger geographic area on       Patient informed that his/her  managed care company has contracts with or will negotiate with certain facilities, including the following:        Yes   Patient/family informed of bed offers received.  Patient chooses bed at  [facility] )     Physician recommends and patient chooses bed at      Patient to be transferred to  ([facility]) on 06/13/16.  Patient to be transferred to facility by  [facility] )     Patient family notified on 06/13/16 of transfer.  Name of family member notified:   (Pt's son, Marya Amsler)     PHYSICIAN Please prepare priority discharge summary, including medications, Please sign FL2     Additional Comment:    _______________________________________________ Glendon Axe A 06/13/2016, 1:01 PM

## 2016-06-13 NOTE — Clinical Social Work Note (Signed)
Clinical Social Work Assessment  Patient Details  Name: Debbie [name] MRN: [id number] Date of Birth: [birth year]  Date of referral:  06/13/16               Reason for consult:  Facility Placement, Discharge Planning                Permission sought to share information with:  Family Supports, Case Manager, Chartered certified accountant granted to share information::  Yes, Verbal Permission Granted  Name::      Leonarda Salon )  Agency::   [facility] )  Relationship::   (Son)  Contact Information:   [phone])  Housing/Transportation Living arrangements for the past 2 months:  [place] of Information:  Patient, Adult Children Patient Interpreter Needed:  None Criminal Activity/Legal Involvement Pertinent to Current Situation/Hospitalization:  No - Comment as needed Significant Relationships:  Adult Children Lives with:  Self Do you feel safe going back to the place where you live?  No Need for family participation in patient care:  Yes (Comment)  Care giving concerns:  Patient requiring STR at skilled level.    Social Worker assessment / plan:  MSW met with patient and son, Marya Amsler in reference to post-acute placement for SNF. Patient and son agreeable to SNF placement and prefers [facility] and [facility]. No further concerns reported at this time. MSW remains available as needed.   Employment status:  Retired Nurse, adult PT Recommendations:  [place] / Referral to community resources:  [place]  Patient/Family's Response to care:  Patient a/o x4 however slightly confused. Patient and son agreeable to SNF at [place] and son pleasant and appreciated social work intervention.   Patient/Family's Understanding of and Emotional Response to Diagnosis, Current Treatment, and Prognosis:  Son strongly involved in care planning and knowledgeable  of medical interventions.   Emotional Assessment Appearance:  Appears stated age Attitude/Demeanor/Rapport:   (Pleasant ) Affect (typically observed):  Accepting, Appropriate, Pleasant Orientation:  Oriented to Situation, Oriented to  Time, Oriented to Place, Oriented to Self Alcohol / Substance use:  Not Applicable Psych involvement (Current and /or in the community):  No (Comment)  Discharge Needs  Concerns to be addressed:  Denies Needs/Concerns at this time Readmission within the last 30 days:  No Current discharge risk:  Dependent with Mobility Barriers to Discharge:  [place], [place] 06/13/2016, 1:04 PM

## 2016-06-13 NOTE — Discharge Summary (Signed)
Physician Discharge Summary  Debbie [name] [id number] DOB: [birth year] DOA: 06/08/2016  PCP: Myriam Jacobson, MD  Admit date: 06/08/2016 Discharge date: 06/13/2016  Admitted From: home Disposition:  SNF  Recommendations for Outpatient Follow-up:  1. Follow up with PCP in 1-2 weeks 2. Please obtain BMP/CBC in one week 3. Resume ACE inhibitor and diuretic once her creatinine returns to baseline.  Home Health:no Equipment/Devices:none  Discharge Condition:Stable CODE STATUS:full Diet recommendation: Heart Healthy  Brief/Interim Summary: 83 year old with past medical history of essential hypertension COPD oxygen dependent, diastolic heart failure diabetes mellitus who presents to the ED with shortness of breath, she relates she woke up this morning unable to catch her breath. She is her nebulizers with no improvement. So she decided to come to the ED.  Discharge Diagnoses:  Acute on chronic respiratory failure with hypoxia: Likely multifactorial due to COPD exacerbation and possible healthcare associated pneumonia with a positive influenza PCR. She was started empirically on IV vancomycin and cefepime and Tamiflu. Her influenza PCR came back positive. She finished her course of Tamiflu in the hospital. When she defervesced and her saturations improved she was changed to oral Levaquin now she will continue as an outpatient for an additional day. She was also started on IV steroids once her wheezing improved she was changed to oral prednisone which she will continue as an outpatient as a steroid taper.  Acute on chronic diastolic heart failure: With the last echo in 05/30/2016 that showed an EF of [id number] with diastolic heart failure. She was resumed on her home dose of torsemide and she developed mild acute renal failure so this was held. Resume in one week as an outpatient.  Acute kidney injury on chronic renal disease stage III: Acute to overdiuresis she was given IV fluids  and her creatinine trended down Baseline creatinine is around 1.8-2, at 2.4 on the day of discharge is 2.1 Have to recheck a basic metabolic panel in one week and resume her ACE inhibitor and diuretic as an outpatient.  Hyperlipidemia: No changes were made.  Normocytic anemia: Her hemoglobin remained stable no changes were made.  Discharge Instructions  Discharge Instructions    Diet - low sodium heart healthy    Complete by:  As directed    Increase activity slowly    Complete by:  As directed      Allergies as of 06/13/2016      Reactions   Codeine Other (See Comments)   Unusual mouth sensation      Medication List    TAKE these medications   Albuterol Sulfate 108 (90 Base) MCG/ACT Aepb Commonly known as:  PROAIR RESPICLICK Inhale 1-2 puffs into the lungs every 4 (four) hours as needed.   amLODipine 10 MG tablet Commonly known as:  NORVASC Take 1 tablet (10 mg total) by mouth daily. Start taking on:  06/16/2016   aspirin EC 81 MG tablet Take 81 mg by mouth daily.   atenolol 50 MG tablet Commonly known as:  TENORMIN Take 1 tablet (50 mg total) by mouth 2 (two) times daily. What changed:  when to take this   atorvastatin 20 MG tablet Commonly known as:  LIPITOR Take 20 mg by mouth daily.   glimepiride 4 MG tablet Commonly known as:  AMARYL Take 4 mg by mouth daily.   hydrALAZINE 25 MG tablet Commonly known as:  APRESOLINE Take 1 tablet (25 mg total) by mouth 3 (three) times daily. Start taking on:  06/16/2016   levofloxacin 500  MG tablet Commonly known as:  LEVAQUIN Take 1 tablet (500 mg total) by mouth every other day. Start taking on:  06/14/2016   linagliptin 5 MG Tabs tablet Commonly known as:  TRADJENTA Take 1 tablet (5 mg total) by mouth daily.   lisinopril 2.5 MG tablet Commonly known as:  PRINIVIL,ZESTRIL Take 1 tablet (2.5 mg total) by mouth daily. Start taking on:  06/18/2016   metFORMIN 1000 MG tablet Commonly known as:  GLUCOPHAGE Take 1  tablet (1,000 mg total) by mouth daily with breakfast. Start taking on:  06/17/2016   mometasone-formoterol 100-5 MCG/ACT Aero Commonly known as:  DULERA Inhale 2 puffs into the lungs 2 (two) times daily.   multivitamin tablet Take 1 tablet by mouth daily. Centrum silver   predniSONE 10 MG tablet Commonly known as:  DELTASONE Takes 6 tablets for 1 days, then 5 tablets for 1 days, then 4 tablets for 1 days, then 3 tablets for 1 days, then 2 tabs for 1 days, then 1 tab for 1 days, and then stop.   sodium chloride 0.65 % Soln nasal spray Commonly known as:  OCEAN Place 1 spray into both nostrils as needed for congestion.   torsemide 10 MG tablet Commonly known as:  DEMADEX Take 1 tablet (10 mg total) by mouth daily. Start taking on:  06/18/2016       Allergies  Allergen Reactions  . Codeine Other (See Comments)    Unusual mouth sensation    Consultations:  None   Procedures/Studies: Dg Chest Port 1 View  Result Date: 06/08/2016 CLINICAL DATA:  83 year old female with acute onset of severe shortness of Breath. Recent unrelenting illness, recently hospitalized. Initial encounter. EXAM: PORTABLE CHEST 1 VIEW COMPARISON:  06/01/2016 and earlier. FINDINGS: Portable AP semi upright view at 0634 hours. Continued small to moderate bilateral pleural effusions with dense bibasilar opacification. Stable cardiomegaly and mediastinal contours. Calcified aortic atherosclerosis. No pneumothorax. Pulmonary vascular congestion without overt edema. Stable bilateral neck surgical clips such as due to prior carotid surgery. No acute osseous abnormality identified. IMPRESSION: Up to moderate bilateral pleural effusions with associated bibasilar collapse or consolidation. No acute edema. Chronic cardiomegaly and Calcified aortic atherosclerosis. Electronically Signed   By: Genevie Ann M.D.   On: 06/08/2016 07:46   Dg Chest Port 1 View  Result Date: 06/01/2016 CLINICAL DATA:  Short of breath. Diabetes.  Endometrial cancer. Confusion. EXAM: PORTABLE CHEST 1 VIEW COMPARISON:  05/29/2016 FINDINGS: Midline trachea. Surgical clips in the neck. Cardiomegaly accentuated by AP portable technique. Atherosclerosis in the transverse aorta. Bilateral pleural effusions are similar. No pneumothorax. Mild interstitial edema, accentuated by low lung volumes. Persistent bibasilar airspace disease. IMPRESSION: No significant change since the prior exam. Congestive heart failure with bilateral pleural effusions and adjacent airspace disease, most likely atelectasis. Electronically Signed   By: Abigail Miyamoto M.D.   On: 06/01/2016 14:57   Dg Chest Port 1 View  Result Date: 05/29/2016 CLINICAL DATA:  New onset of wheezing EXAM: PORTABLE CHEST 1 VIEW COMPARISON:  01/16/2016 FINDINGS: Cardiomegaly is noted. Central mild vascular congestion and mild perihilar interstitial prominence suspicious for mild interstitial edema. Small bilateral pleural effusion with bilateral basilar atelectasis or infiltrate. IMPRESSION: Central mild vascular congestion and mild perihilar interstitial prominence suspicious for mild interstitial edema. Small bilateral pleural effusion with bilateral basilar atelectasis or infiltrate. Electronically Signed   By: Lahoma Crocker M.D.   On: 05/29/2016 09:31    Subjective: She relates no new complaints, she relates her breathing is back  to baseline. She relates her appetite has returned.  Discharge Exam: Vitals:   06/12/16 2200 06/13/16 0526  BP:  (!) 148/91  Pulse: (!) 106 83  Resp:  20  Temp:  97.5 F (36.4 C)   Vitals:   06/12/16 2159 06/12/16 2200 06/13/16 0526 06/13/16 0915  BP:   (!) 148/91   Pulse: (!) 119 (!) 106 83   Resp: 20  20   Temp:   97.5 F (36.4 C)   TempSrc:   Oral   SpO2: 91%  96% 91%  Weight:   81.1 kg (178 lb 12.7 oz)   Height:        General: Pt is alert, awake, not in acute distress Cardiovascular: RRR, S1/S2 +, no rubs, no gallops Respiratory: CTA bilaterally, no  wheezing, no rhonchi Abdominal: Soft, NT, ND, bowel sounds + Extremities: no edema, no cyanosis    The results of significant diagnostics from this hospitalization (including imaging, microbiology, ancillary and laboratory) are listed below for reference.     Microbiology: Recent Results (from the past 240 hour(s))  MRSA PCR Screening     Status: None   Collection Time: 06/08/16 11:41 AM  Result Value Ref Range Status   MRSA by PCR NEGATIVE NEGATIVE Final    Comment:        The GeneXpert MRSA Assay (FDA approved for NASAL specimens only), is one component of a comprehensive MRSA colonization surveillance program. It is not intended to diagnose MRSA infection nor to guide or monitor treatment for MRSA infections.      Labs: BNP (last 3 results)  Recent Labs  06/08/16 0751  BNP [id number]*   Basic Metabolic Panel:  Recent Labs Lab 06/09/16 0401 06/10/16 0325 06/11/16 0526 06/12/16 1233 06/13/16 0520  NA 142 143 140 141 145  K 3.6 3.9 4.5 4.0 4.7  CL 103 104 103 103 106  CO2 29 30 28 30 29   GLUCOSE 180* 181* 163* 139* 179*  BUN 31* 40* 56* 56* 52*  CREATININE 2.09* 2.27* 2.45* 2.20* 2.14*  CALCIUM 8.6* 8.6* 8.3* 8.5* 8.5*   Liver Function Tests:  Recent Labs Lab 06/08/16 0751  AST 22  ALT 21  ALKPHOS 64  BILITOT 0.7  PROT 6.1*  ALBUMIN 2.7*   No results for input(s): LIPASE, AMYLASE in the last 168 hours. No results for input(s): AMMONIA in the last 168 hours. CBC:  Recent Labs Lab 06/08/16 0751 06/08/16 0808 06/09/16 0401 06/10/16 0325 06/11/16 0526  WBC 10.8*  --  11.4* 13.6* 11.8*  NEUTROABS 9.6*  --   --   --   --   HGB 9.8* 10.2* 9.6* 9.1* 9.2*  HCT 30.8* 30.0* 29.4* 27.8* 28.3*  MCV 94.5  --  93.0 92.7 92.8  PLT 327  --  303 288 282   Cardiac Enzymes: No results for input(s): CKTOTAL, CKMB, CKMBINDEX, TROPONINI in the last 168 hours. BNP: Invalid input(s): POCBNP CBG:  Recent Labs Lab 06/12/16 0802 06/12/16 1319 06/12/16 1807  06/12/16 2144 06/13/16 0809  GLUCAP 152* 129* 80 222* 139*   D-Dimer No results for input(s): DDIMER in the last 72 hours. Hgb A1c No results for input(s): HGBA1C in the last 72 hours. Lipid Profile No results for input(s): CHOL, HDL, LDLCALC, TRIG, CHOLHDL, LDLDIRECT in the last 72 hours. Thyroid function studies No results for input(s): TSH, T4TOTAL, T3FREE, THYROIDAB in the last 72 hours.  Invalid input(s): FREET3 Anemia work up No results for input(s): VITAMINB12, FOLATE, FERRITIN, TIBC, IRON, RETICCTPCT  in the last 72 hours. Urinalysis    Component Value Date/Time   COLORURINE YELLOW 05/28/2016 0848   APPEARANCEUR CLEAR 05/28/2016 0848   LABSPEC 1.008 05/28/2016 0848   PHURINE 6.0 05/28/2016 0848   GLUCOSEU 50 (A) 05/28/2016 0848   HGBUR NEGATIVE 05/28/2016 0848   BILIRUBINUR NEGATIVE 05/28/2016 0848   KETONESUR NEGATIVE 05/28/2016 0848   PROTEINUR 100 (A) 05/28/2016 0848   UROBILINOGEN 1.0 03/07/2014 1510   NITRITE POSITIVE (A) 05/28/2016 0848   LEUKOCYTESUR TRACE (A) 05/28/2016 0848   Sepsis Labs Invalid input(s): PROCALCITONIN,  WBC,  LACTICIDVEN Microbiology Recent Results (from the past 240 hour(s))  MRSA PCR Screening     Status: None   Collection Time: 06/08/16 11:41 AM  Result Value Ref Range Status   MRSA by PCR NEGATIVE NEGATIVE Final    Comment:        The GeneXpert MRSA Assay (FDA approved for NASAL specimens only), is one component of a comprehensive MRSA colonization surveillance program. It is not intended to diagnose MRSA infection nor to guide or monitor treatment for MRSA infections.      Time coordinating discharge: Over 30 minutes  SIGNED:   Charlynne Cousins, MD  [facility] 06/13/2016, 9:57 AM Pager   If 7PM-7AM, please contact night-coverage [email or web address] Password [email or web address]

## 2016-06-13 NOTE — Clinical Social Work Note (Signed)
Auth obtained: [id number] RHB, 496 mins per week, next review date: 1/7.  Medical Social Worker facilitated patient discharge including contacting patient family and facility to confirm patient discharge plans.  Clinical information faxed to facility and family agreeable with plan.  MSW arranged ambulance transport via [place] to [facility].  RN to call report prior to discharge.  Medical Social Worker will sign off for now as social work intervention is no longer needed. Please consult [place] again if new need arises.  Glendon Axe, MSW [phone] 06/13/2016 4:42 PM

## 2016-06-25 ENCOUNTER — Inpatient Hospital Stay (HOSPITAL_COMMUNITY): Payer: Medicare HMO

## 2016-06-25 ENCOUNTER — Emergency Department (HOSPITAL_COMMUNITY): Payer: Medicare HMO

## 2016-06-25 ENCOUNTER — Inpatient Hospital Stay (HOSPITAL_COMMUNITY)
Admission: EM | Admit: 2016-06-25 | Discharge: 2016-07-19 | DRG: 673 | Disposition: A | Discharge status: Home / Self Care | Payer: Medicare HMO | Attending: Internal Medicine | Admitting: Internal Medicine

## 2016-06-25 ENCOUNTER — Encounter (HOSPITAL_COMMUNITY): Payer: Self-pay

## 2016-06-25 DIAGNOSIS — E871 Hypo-osmolality and hyponatremia: Secondary | ICD-10-CM | POA: Diagnosis present

## 2016-06-25 DIAGNOSIS — Z789 Other specified health status: Secondary | ICD-10-CM

## 2016-06-25 DIAGNOSIS — R001 Bradycardia, unspecified: Secondary | ICD-10-CM | POA: Diagnosis present

## 2016-06-25 DIAGNOSIS — Z01810 Encounter for preprocedural cardiovascular examination: Secondary | ICD-10-CM | POA: Diagnosis not present

## 2016-06-25 DIAGNOSIS — F17200 Nicotine dependence, unspecified, uncomplicated: Secondary | ICD-10-CM | POA: Diagnosis present

## 2016-06-25 DIAGNOSIS — Z90710 Acquired absence of both cervix and uterus: Secondary | ICD-10-CM

## 2016-06-25 DIAGNOSIS — R34 Anuria and oliguria: Secondary | ICD-10-CM | POA: Diagnosis present

## 2016-06-25 DIAGNOSIS — E118 Type 2 diabetes mellitus with unspecified complications: Secondary | ICD-10-CM | POA: Diagnosis not present

## 2016-06-25 DIAGNOSIS — Z7984 Long term (current) use of oral hypoglycemic drugs: Secondary | ICD-10-CM

## 2016-06-25 DIAGNOSIS — I6523 Occlusion and stenosis of bilateral carotid arteries: Secondary | ICD-10-CM | POA: Diagnosis present

## 2016-06-25 DIAGNOSIS — I509 Heart failure, unspecified: Secondary | ICD-10-CM

## 2016-06-25 DIAGNOSIS — I2510 Atherosclerotic heart disease of native coronary artery without angina pectoris: Secondary | ICD-10-CM | POA: Diagnosis present

## 2016-06-25 DIAGNOSIS — E669 Obesity, unspecified: Secondary | ICD-10-CM | POA: Diagnosis present

## 2016-06-25 DIAGNOSIS — R011 Cardiac murmur, unspecified: Secondary | ICD-10-CM | POA: Diagnosis present

## 2016-06-25 DIAGNOSIS — R0902 Hypoxemia: Secondary | ICD-10-CM

## 2016-06-25 DIAGNOSIS — J90 Pleural effusion, not elsewhere classified: Secondary | ICD-10-CM | POA: Diagnosis not present

## 2016-06-25 DIAGNOSIS — N170 Acute kidney failure with tubular necrosis: Secondary | ICD-10-CM | POA: Diagnosis present

## 2016-06-25 DIAGNOSIS — Z885 Allergy status to narcotic agent status: Secondary | ICD-10-CM

## 2016-06-25 DIAGNOSIS — D649 Anemia, unspecified: Secondary | ICD-10-CM | POA: Diagnosis not present

## 2016-06-25 DIAGNOSIS — Z79899 Other long term (current) drug therapy: Secondary | ICD-10-CM

## 2016-06-25 DIAGNOSIS — E1151 Type 2 diabetes mellitus with diabetic peripheral angiopathy without gangrene: Secondary | ICD-10-CM | POA: Diagnosis present

## 2016-06-25 DIAGNOSIS — I442 Atrioventricular block, complete: Secondary | ICD-10-CM | POA: Diagnosis present

## 2016-06-25 DIAGNOSIS — E1122 Type 2 diabetes mellitus with diabetic chronic kidney disease: Secondary | ICD-10-CM | POA: Diagnosis present

## 2016-06-25 DIAGNOSIS — I708 Atherosclerosis of other arteries: Secondary | ICD-10-CM | POA: Diagnosis present

## 2016-06-25 DIAGNOSIS — Z452 Encounter for adjustment and management of vascular access device: Secondary | ICD-10-CM

## 2016-06-25 DIAGNOSIS — I482 Chronic atrial fibrillation: Secondary | ICD-10-CM | POA: Diagnosis not present

## 2016-06-25 DIAGNOSIS — Z801 Family history of malignant neoplasm of trachea, bronchus and lung: Secondary | ICD-10-CM

## 2016-06-25 DIAGNOSIS — Z8049 Family history of malignant neoplasm of other genital organs: Secondary | ICD-10-CM

## 2016-06-25 DIAGNOSIS — I248 Other forms of acute ischemic heart disease: Secondary | ICD-10-CM | POA: Diagnosis present

## 2016-06-25 DIAGNOSIS — D638 Anemia in other chronic diseases classified elsewhere: Secondary | ICD-10-CM | POA: Diagnosis present

## 2016-06-25 DIAGNOSIS — Z823 Family history of stroke: Secondary | ICD-10-CM

## 2016-06-25 DIAGNOSIS — N19 Unspecified kidney failure: Secondary | ICD-10-CM | POA: Diagnosis present

## 2016-06-25 DIAGNOSIS — J449 Chronic obstructive pulmonary disease, unspecified: Secondary | ICD-10-CM | POA: Diagnosis present

## 2016-06-25 DIAGNOSIS — T502X5A Adverse effect of carbonic-anhydrase inhibitors, benzothiadiazides and other diuretics, initial encounter: Secondary | ICD-10-CM | POA: Diagnosis present

## 2016-06-25 DIAGNOSIS — Z9981 Dependence on supplemental oxygen: Secondary | ICD-10-CM | POA: Diagnosis not present

## 2016-06-25 DIAGNOSIS — I5032 Chronic diastolic (congestive) heart failure: Secondary | ICD-10-CM | POA: Diagnosis present

## 2016-06-25 DIAGNOSIS — Z992 Dependence on renal dialysis: Secondary | ICD-10-CM | POA: Diagnosis not present

## 2016-06-25 DIAGNOSIS — N186 End stage renal disease: Secondary | ICD-10-CM | POA: Diagnosis present

## 2016-06-25 DIAGNOSIS — T68XXXA Hypothermia, initial encounter: Secondary | ICD-10-CM | POA: Diagnosis present

## 2016-06-25 DIAGNOSIS — I4891 Unspecified atrial fibrillation: Secondary | ICD-10-CM | POA: Diagnosis not present

## 2016-06-25 DIAGNOSIS — R0602 Shortness of breath: Secondary | ICD-10-CM

## 2016-06-25 DIAGNOSIS — I959 Hypotension, unspecified: Secondary | ICD-10-CM

## 2016-06-25 DIAGNOSIS — N179 Acute kidney failure, unspecified: Secondary | ICD-10-CM

## 2016-06-25 DIAGNOSIS — Z7982 Long term (current) use of aspirin: Secondary | ICD-10-CM

## 2016-06-25 DIAGNOSIS — I459 Conduction disorder, unspecified: Secondary | ICD-10-CM

## 2016-06-25 DIAGNOSIS — I953 Hypotension of hemodialysis: Secondary | ICD-10-CM | POA: Diagnosis not present

## 2016-06-25 DIAGNOSIS — E875 Hyperkalemia: Secondary | ICD-10-CM | POA: Diagnosis present

## 2016-06-25 DIAGNOSIS — Z7951 Long term (current) use of inhaled steroids: Secondary | ICD-10-CM

## 2016-06-25 DIAGNOSIS — Z8544 Personal history of malignant neoplasm of other female genital organs: Secondary | ICD-10-CM

## 2016-06-25 DIAGNOSIS — I132 Hypertensive heart and chronic kidney disease with heart failure and with stage 5 chronic kidney disease, or end stage renal disease: Secondary | ICD-10-CM | POA: Diagnosis present

## 2016-06-25 DIAGNOSIS — Z6825 Body mass index (BMI) 25.0-25.9, adult: Secondary | ICD-10-CM

## 2016-06-25 DIAGNOSIS — Z923 Personal history of irradiation: Secondary | ICD-10-CM

## 2016-06-25 DIAGNOSIS — F419 Anxiety disorder, unspecified: Secondary | ICD-10-CM | POA: Diagnosis present

## 2016-06-25 DIAGNOSIS — Z8542 Personal history of malignant neoplasm of other parts of uterus: Secondary | ICD-10-CM

## 2016-06-25 DIAGNOSIS — Y92230 Patient room in hospital as the place of occurrence of the external cause: Secondary | ICD-10-CM | POA: Diagnosis present

## 2016-06-25 DIAGNOSIS — J9621 Acute and chronic respiratory failure with hypoxia: Secondary | ICD-10-CM | POA: Diagnosis not present

## 2016-06-25 DIAGNOSIS — Z419 Encounter for procedure for purposes other than remedying health state, unspecified: Secondary | ICD-10-CM

## 2016-06-25 DIAGNOSIS — Z8249 Family history of ischemic heart disease and other diseases of the circulatory system: Secondary | ICD-10-CM

## 2016-06-25 DIAGNOSIS — T447X5A Adverse effect of beta-adrenoreceptor antagonists, initial encounter: Secondary | ICD-10-CM | POA: Diagnosis present

## 2016-06-25 LAB — BASIC METABOLIC PANEL
Anion gap: 13 (ref 5–15)
BUN: 133 mg/dL — ABNORMAL HIGH (ref 6–20)
CHLORIDE: 93 mmol/L — AB (ref 101–111)
CO2: 23 mmol/L (ref 22–32)
CREATININE: 6.98 mg/dL — AB (ref 0.44–1.00)
Calcium: 8.3 mg/dL — ABNORMAL LOW (ref 8.9–10.3)
GFR calc Af Amer: 6 mL/min — ABNORMAL LOW (ref >60)
GFR calc non Af Amer: 5 mL/min — ABNORMAL LOW (ref >60)
Glucose, Bld: 119 mg/dL — ABNORMAL HIGH (ref 65–99)
POTASSIUM: 6.5 mmol/L — AB (ref 3.5–5.1)
SODIUM: 129 mmol/L — AB (ref 135–145)

## 2016-06-25 LAB — CREATININE, SERUM
CREATININE: 6.63 mg/dL — AB (ref 0.44–1.00)
GFR, EST AFRICAN AMERICAN: 6 mL/min — AB (ref >60)
GFR, EST NON AFRICAN AMERICAN: 5 mL/min — AB (ref >60)

## 2016-06-25 LAB — CBC
HEMATOCRIT: 26.6 % — AB (ref 36.0–46.0)
HEMATOCRIT: 25.5 % — AB (ref 36.0–46.0)
Hemoglobin: 8.6 g/dL — ABNORMAL LOW (ref 12.0–15.0)
Hemoglobin: 8.6 g/dL — ABNORMAL LOW (ref 12.0–15.0)
MCH: 29.8 pg (ref 26.0–34.0)
MCH: 30.3 pg (ref 26.0–34.0)
MCHC: 32.3 g/dL (ref 30.0–36.0)
MCHC: 33.7 g/dL (ref 30.0–36.0)
MCV: 92 fL (ref 78.0–100.0)
MCV: 89.8 fL (ref 78.0–100.0)
Platelets: 270 10*3/uL (ref 150–400)
Platelets: 283 10*3/uL (ref 150–400)
RBC: 2.89 MIL/uL — AB (ref 3.87–5.11)
RBC: 2.84 MIL/uL — ABNORMAL LOW (ref 3.87–5.11)
RDW: 16.8 % — AB (ref 11.5–15.5)
RDW: 16.7 % — AB (ref 11.5–15.5)
WBC: 10.5 10*3/uL (ref 4.0–10.5)
WBC: 9.6 10*3/uL (ref 4.0–10.5)

## 2016-06-25 LAB — I-STAT TROPONIN, ED: Troponin i, poc: 0.05 ng/mL (ref 0.00–0.08)

## 2016-06-25 LAB — POTASSIUM: Potassium: 6.8 mmol/L (ref 3.5–5.1)

## 2016-06-25 MED ORDER — SODIUM POLYSTYRENE SULFONATE 15 GM/60ML PO SUSP
30.0000 g | ORAL | Status: AC
  Administered 2016-06-26: 30 g via ORAL
  Filled 2016-06-25: qty 120

## 2016-06-25 MED ORDER — PRISMASOL BGK 4/2.5 32-4-2.5 MEQ/L IV SOLN
INTRAVENOUS | Status: DC
  Administered 2016-06-26 – 2016-06-27 (×10): via INTRAVENOUS_CENTRAL
  Filled 2016-06-25 (×20): qty 5000

## 2016-06-25 MED ORDER — SODIUM CHLORIDE 0.9 % IV SOLN
1.0000 g | Freq: Once | INTRAVENOUS | Status: AC
  Administered 2016-06-25: 1 g via INTRAVENOUS
  Filled 2016-06-25: qty 10

## 2016-06-25 MED ORDER — PRISMASOL BGK 4/2.5 32-4-2.5 MEQ/L IV SOLN
INTRAVENOUS | Status: DC
  Administered 2016-06-26 – 2016-06-27 (×2): via INTRAVENOUS_CENTRAL
  Filled 2016-06-25 (×11): qty 5000

## 2016-06-25 MED ORDER — SODIUM CHLORIDE 0.9 % IV SOLN: INTRAVENOUS | Status: DC

## 2016-06-25 MED ORDER — ACETAMINOPHEN 325 MG PO TABS: 650.0000 mg | ORAL_TABLET | Freq: Four times a day (QID) | ORAL | Status: DC | PRN

## 2016-06-25 MED ORDER — INSULIN ASPART 100 UNIT/ML ~~LOC~~ SOLN
0.0000 [IU] | Freq: Three times a day (TID) | SUBCUTANEOUS | Status: DC
  Administered 2016-06-26 – 2016-06-28 (×4): 1 [IU] via SUBCUTANEOUS
  Administered 2016-07-01: 2 [IU] via SUBCUTANEOUS
  Administered 2016-07-06: 3 [IU] via SUBCUTANEOUS
  Administered 2016-07-07 – 2016-07-08 (×2): 2 [IU] via SUBCUTANEOUS
  Administered 2016-07-08: 1 [IU] via SUBCUTANEOUS
  Administered 2016-07-09: 2 [IU] via SUBCUTANEOUS
  Administered 2016-07-10 (×2): 1 [IU] via SUBCUTANEOUS
  Administered 2016-07-11: 3 [IU] via SUBCUTANEOUS
  Administered 2016-07-15: 2 [IU] via SUBCUTANEOUS
  Administered 2016-07-16 – 2016-07-18 (×3): 1 [IU] via SUBCUTANEOUS
  Administered 2016-07-18: 3 [IU] via SUBCUTANEOUS
  Administered 2016-07-19: 1 [IU] via SUBCUTANEOUS

## 2016-06-25 MED ORDER — SODIUM CHLORIDE 0.9 % IV BOLUS (SEPSIS): 500.0000 mL | Freq: Once | INTRAVENOUS | Status: DC

## 2016-06-25 MED ORDER — ONDANSETRON HCL 4 MG PO TABS: 4.0000 mg | ORAL_TABLET | Freq: Four times a day (QID) | ORAL | Status: DC | PRN

## 2016-06-25 MED ORDER — SODIUM POLYSTYRENE SULFONATE 15 GM/60ML PO SUSP
30.0000 g | Freq: Once | ORAL | Status: AC
  Administered 2016-06-25: 30 g via ORAL
  Filled 2016-06-25: qty 120

## 2016-06-25 MED ORDER — HEPARIN SODIUM (PORCINE) 5000 UNIT/ML IJ SOLN
5000.0000 [IU] | Freq: Three times a day (TID) | INTRAMUSCULAR | Status: DC
  Administered 2016-06-26 – 2016-07-19 (×65): 5000 [IU] via SUBCUTANEOUS
  Filled 2016-06-25 (×59): qty 1

## 2016-06-25 MED ORDER — ASPIRIN EC 81 MG PO TBEC
81.0000 mg | DELAYED_RELEASE_TABLET | Freq: Every day | ORAL | Status: DC
  Administered 2016-06-26 – 2016-07-19 (×23): 81 mg via ORAL
  Filled 2016-06-25 (×23): qty 1

## 2016-06-25 MED ORDER — SODIUM CHLORIDE 0.9 % FOR CRRT
INTRAVENOUS_CENTRAL | Status: DC | PRN
  Administered 2016-06-26 – 2016-06-27 (×2): 999 mL via INTRAVENOUS_CENTRAL
  Filled 2016-06-25 (×3): qty 1000

## 2016-06-25 MED ORDER — ONDANSETRON HCL 4 MG/2ML IJ SOLN: 4.0000 mg | Freq: Four times a day (QID) | INTRAMUSCULAR | Status: DC | PRN

## 2016-06-25 MED ORDER — DEXTROSE 50 % IV SOLN
1.0000 | Freq: Once | INTRAVENOUS | Status: AC
  Administered 2016-06-25: 50 mL via INTRAVENOUS
  Filled 2016-06-25: qty 50

## 2016-06-25 MED ORDER — ADULT MULTIVITAMIN W/MINERALS CH
1.0000 | ORAL_TABLET | Freq: Every day | ORAL | Status: DC
  Administered 2016-06-26 – 2016-07-19 (×23): 1 via ORAL
  Filled 2016-06-25 (×23): qty 1

## 2016-06-25 MED ORDER — SALINE SPRAY 0.65 % NA SOLN
1.0000 | NASAL | Status: DC | PRN
  Filled 2016-06-25 (×2): qty 44

## 2016-06-25 MED ORDER — ARFORMOTEROL TARTRATE 15 MCG/2ML IN NEBU
15.0000 ug | INHALATION_SOLUTION | Freq: Two times a day (BID) | RESPIRATORY_TRACT | Status: DC
Start: 2016-06-25 — End: 2016-07-19
  Administered 2016-06-26 – 2016-07-19 (×45): 15 ug via RESPIRATORY_TRACT
  Filled 2016-06-25 (×48): qty 2

## 2016-06-25 MED ORDER — SODIUM CHLORIDE 0.9 % IV BOLUS (SEPSIS)
500.0000 mL | Freq: Once | INTRAVENOUS | Status: AC
  Administered 2016-06-25: 500 mL via INTRAVENOUS

## 2016-06-25 MED ORDER — ATORVASTATIN CALCIUM 20 MG PO TABS
20.0000 mg | ORAL_TABLET | Freq: Every day | ORAL | Status: DC
  Administered 2016-06-26 – 2016-07-18 (×23): 20 mg via ORAL
  Filled 2016-06-25 (×24): qty 1

## 2016-06-25 MED ORDER — ACETAMINOPHEN 650 MG RE SUPP: 650.0000 mg | Freq: Four times a day (QID) | RECTAL | Status: DC | PRN

## 2016-06-25 MED ORDER — SODIUM BICARBONATE 8.4 % IV SOLN
Freq: Once | INTRAVENOUS | Status: AC
  Administered 2016-06-25: 15:00:00 via INTRAVENOUS
  Filled 2016-06-25: qty 100

## 2016-06-25 MED ORDER — MOMETASONE FURO-FORMOTEROL FUM 100-5 MCG/ACT IN AERO
2.0000 | INHALATION_SPRAY | Freq: Two times a day (BID) | RESPIRATORY_TRACT | Status: DC
  Filled 2016-06-25: qty 8.8

## 2016-06-25 MED ORDER — ALBUTEROL SULFATE (2.5 MG/3ML) 0.083% IN NEBU
10.0000 mg | INHALATION_SOLUTION | Freq: Once | RESPIRATORY_TRACT | Status: AC
  Administered 2016-06-25: 10 mg via RESPIRATORY_TRACT
  Filled 2016-06-25: qty 12

## 2016-06-25 MED ORDER — ALBUTEROL SULFATE (2.5 MG/3ML) 0.083% IN NEBU
2.5000 mg | INHALATION_SOLUTION | RESPIRATORY_TRACT | Status: DC | PRN
  Administered 2016-06-28: 2.5 mg via RESPIRATORY_TRACT
  Filled 2016-06-25: qty 3

## 2016-06-25 MED ORDER — INSULIN ASPART 100 UNIT/ML IV SOLN
10.0000 [IU] | Freq: Once | INTRAVENOUS | Status: AC
  Administered 2016-06-25: 10 [IU] via INTRAVENOUS
  Filled 2016-06-25: qty 0.1

## 2016-06-25 MED ORDER — HEPARIN SODIUM (PORCINE) 1000 UNIT/ML DIALYSIS
1000.0000 [IU] | INTRAMUSCULAR | Status: DC | PRN
  Filled 2016-06-25: qty 6
  Filled 2016-06-25: qty 2

## 2016-06-25 MED ORDER — PRISMASOL BGK 4/2.5 32-4-2.5 MEQ/L IV SOLN
INTRAVENOUS | Status: DC
  Administered 2016-06-26 – 2016-06-27 (×6): via INTRAVENOUS_CENTRAL
  Filled 2016-06-25 (×8): qty 5000

## 2016-06-25 MED ORDER — BUDESONIDE 0.5 MG/2ML IN SUSP
0.5000 mg | Freq: Two times a day (BID) | RESPIRATORY_TRACT | Status: DC
  Administered 2016-06-26 – 2016-07-19 (×45): 0.5 mg via RESPIRATORY_TRACT
  Filled 2016-06-25 (×48): qty 2

## 2016-06-25 MED ORDER — FENTANYL CITRATE (PF) 100 MCG/2ML IJ SOLN
INTRAMUSCULAR | Status: AC
  Filled 2016-06-25: qty 2

## 2016-06-25 NOTE — ED Triage Notes (Addendum)
PT RECEIVED FROM [facility]  VIA EMS FOR SOB AND DRY COUGH. PER EMS, THE INITIAL O2 SAT WAS 85%. SHE WAS GIVEN O2 AT 4L AND THE SATS WERE 89-93%. SHE ALSO HAD BLOOD DRAWN 2 DAYS AGO AND THE K+ OF 6.8. DENIES ANY CHEST PAIN AT THIS TIME.

## 2016-06-25 NOTE — Procedures (Signed)
Temporary Hemodialysis Catheter Insertion Procedure Note YOUA GOELZ [id number] 1931/02/19  Procedure: Insertion of temporary hemodialysis catheter Indications: Hemodialysis  Procedure Details Consent: Risks of procedure as well as the alternatives and risks of each were explained to the (patient/caregiver).  Consent for procedure obtained. Time Out: Verified patient identification, verified procedure, site/side was marked, verified correct patient position, special equipment/implants available, medications/allergies/relevent history reviewed, required imaging and test results available.  Performed  Maximum sterile technique was used including antiseptics, cap, gloves, gown, hand hygiene, mask and sheet. Skin prep: Chlorhexidine; local anesthetic administered A antimicrobial bonded/coated triple lumen (Trialysis) hemodialysis catheter was placed in the right internal jugular vein using the Seldinger technique.  Evaluation Blood flow good Complications: No apparent complications Patient did tolerate procedure well. Chest X-ray ordered to verify placement.  CXR: pending.  Jacques Earthly, MD  Internal Medicine PGY-3 06/25/2016, 11:14 PM

## 2016-06-25 NOTE — ED Notes (Signed)
Nurse is in the room starting an IV

## 2016-06-25 NOTE — ED Provider Notes (Addendum)
[facility] DEPT Provider Note   CSN: [id number] Arrival date & time: 06/25/16  1212     History   Chief Complaint Chief Complaint  Patient presents with  . Shortness of Breath    HPI Debbie [name] is a 83 y.o. female.  She is here for evaluation of elevated BUN and creatinine, which were noted on labs drawn 2 days ago. She has been recovering, going to rehabilitation, at a local facility after hospitalization for "influenza". Labs indicated, potassium, and Creatinine was high several days ago, is not clear if that was treated. Patient complains of shortness of breath with cough. She is a poor historian. Most of the history comes from the patient's son. The patient is essentially nonambulatory, except when being assisted by physical therapy at her rehabilitation center. Patient denies nausea, vomiting, headache or chest pain.  HPI  Past Medical History:  Diagnosis Date  . Allergy   . Anxiety   . Arthritis   . Cancer ([place]) dx'd 12/2007   endometroid adenocarcinoma  . Cataract    both  . Diabetes mellitus    fasting cbgs100-120  . Dizziness   . History of radiation therapy 9/21,9/28,10/05,05/20/2008   4 txs 2400 cGy endometrial adenocarcinoma  . Hypertension   . Peripheral vascular disease ([place])   . Seasonal allergies   . 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 UTI (urinary tract infection) 05/2016    Patient Active Problem List   Diagnosis Date Noted  . COPD exacerbation ([place]) 06/08/2016  . Diastolic heart failure ([place]) 06/08/2016  . Acute respiratory failure with hypoxia ()   . COPD with hypoxia ([place]) 06/02/2016  . Pressure injury of skin 05/29/2016  . Respiratory distress 05/29/2016  . UTI (urinary tract infection) 05/28/2016  . Hypoglycemia 05/28/2016  . Diabetes ([place]) 05/28/2016  . Anxiety 05/28/2016  . Hypertension 05/28/2016  . Diabetes mellitus with complication ([place])   . Renal insufficiency   . Urinary tract infection without  hematuria   . 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 Carotid stenosis, asymptomatic 11/28/2014  . Aftercare following surgery of the circulatory system 04/11/2014  . Aftercare following surgery of the circulatory system, [place] 01/31/2014  . Carotid stenosis 01/10/2014  . Occlusion and stenosis of carotid artery without mention of cerebral infarction 01/10/2014  . Malignant neoplasm of corpus uteri, except isthmus ([place]) 07/07/2011    Past Surgical History:  Procedure Laterality Date  . ABDOMINAL HYSTERECTOMY    . APPENDECTOMY    . ENDARTERECTOMY Left 01/12/2014   Procedure: LEFT CAROTID ARTERY ENDARTERECTOMY WITH HEMASHIELD PATCH ANGIOPLASTY;  Surgeon: Mal Misty, MD;  Location: [facility];  Service: Vascular;  Laterality: Left;  . ENDARTERECTOMY Right 03/17/2014   Procedure: RIGHT CAROTD ARTERY ENDARTERECTOMY WITH DACRON PATCH ANGIOPLASTY;  Surgeon: Mal Misty, MD;  Location: [facility];  Service: Vascular;  Laterality: Right;  . EYE SURGERY Bilateral    cataracts  . NASAL SEPTUM SURGERY    . NECK SURGERY Bilateral    arterial  . TONSILLECTOMY      OB History    No data available       Home Medications    Prior to Admission medications   Medication Sig Start Date End Date Taking? Authorizing Provider  Albuterol Sulfate (PROAIR RESPICLICK) 390 (90 Base) MCG/ACT AEPB Inhale 1-2 puffs into the lungs every 4 (four) hours as needed. 06/02/16   Doreatha Lew, MD  amLODipine (NORVASC) 10 MG tablet Take 1 tablet (10 mg  total) by mouth daily. 06/16/16   Charlynne Cousins, MD  aspirin EC 81 MG tablet Take 81 mg by mouth daily.    Historical Provider, MD  atenolol (TENORMIN) 50 MG tablet Take 1 tablet (50 mg total) by mouth 2 (two) times daily. Patient taking differently: Take 50 mg by mouth daily.  06/02/16   Doreatha Lew, MD  atorvastatin (LIPITOR) 20 MG tablet Take 20 mg by mouth daily. 05/26/16   Historical Provider, MD  glimepiride (AMARYL) 4 MG tablet Take  4 mg by mouth daily. 05/23/16   Historical Provider, MD  hydrALAZINE (APRESOLINE) 25 MG tablet Take 1 tablet (25 mg total) by mouth 3 (three) times daily. 06/16/16   Charlynne Cousins, MD  levofloxacin (LEVAQUIN) 500 MG tablet Take 1 tablet (500 mg total) by mouth every other day. 06/14/16   Charlynne Cousins, MD  linagliptin (TRADJENTA) 5 MG TABS tablet Take 1 tablet (5 mg total) by mouth daily. 06/02/16   Doreatha Lew, MD  lisinopril (PRINIVIL,ZESTRIL) 2.5 MG tablet Take 1 tablet (2.5 mg total) by mouth daily. 06/18/16   Charlynne Cousins, MD  metFORMIN (GLUCOPHAGE) 1000 MG tablet Take 1 tablet (1,000 mg total) by mouth daily with breakfast. 06/17/16   Charlynne Cousins, MD  mometasone-formoterol [facility]) 100-5 MCG/ACT AERO Inhale 2 puffs into the lungs 2 (two) times daily. 06/02/16   Doreatha Lew, MD  Multiple Vitamin (MULTIVITAMIN) tablet Take 1 tablet by mouth daily. Centrum silver    Historical Provider, MD  predniSONE (DELTASONE) 10 MG tablet Takes 6 tablets for 1 days, then 5 tablets for 1 days, then 4 tablets for 1 days, then 3 tablets for 1 days, then 2 tabs for 1 days, then 1 tab for 1 days, and then stop. 06/13/16   Charlynne Cousins, MD  sodium chloride (OCEAN) 0.65 % SOLN nasal spray Place 1 spray into both nostrils as needed for congestion.    Historical Provider, MD  torsemide (DEMADEX) 10 MG tablet Take 1 tablet (10 mg total) by mouth daily. 06/18/16   Charlynne Cousins, MD    Family History Family History  Problem Relation Age of Onset  . Actinic keratosis Mother   . Cancer Mother     uterine  . Heart disease Father   . Hyperlipidemia Father   . Hypertension Father   . Stroke Father   . Cancer Sister     lung    Social History Social History  Substance Use Topics  . Smoking status: Never Smoker  . Smokeless tobacco: Never Used  . Alcohol use No     Allergies   Codeine   Review of Systems Review of Systems  All other systems reviewed and are  negative.    Physical Exam Updated Vital Signs BP 112/66   Pulse (!) 42   Temp (!) 96.1 F (35.6 C) (Rectal)   Resp 17   Ht _0  (1.651 m)   Wt 190 lb (86.2 kg)   SpO2 94%   BMI 31.62 kg/m   Physical Exam  Constitutional: She appears well-developed. She appears ill.  Elderly, overweight  HENT:  Head: Normocephalic and atraumatic.  Eyes: Conjunctivae and EOM are normal. Pupils are equal, round, and reactive to light.  Neck: Normal range of motion and phonation normal. Neck supple.  Cardiovascular:  Bradycardic  Pulmonary/Chest: Effort normal and breath sounds normal. No respiratory distress. She exhibits no tenderness.  Abdominal: Soft. She exhibits no distension. There is no tenderness.  There is no guarding.  Musculoskeletal: Normal range of motion. She exhibits no edema.  Neurological: She is alert. She exhibits normal muscle tone.  Skin: Skin is warm and dry.  Psychiatric: She has a normal mood and affect. Her behavior is normal.  Nursing note and vitals reviewed.    ED Treatments / Results  Labs (all labs ordered are listed, but only abnormal results are displayed) Labs Reviewed  BASIC METABOLIC PANEL - Abnormal; Notable for the following:       Result Value   Sodium 129 (*)    Potassium 6.5 (*)    Chloride 93 (*)    Glucose, Bld 119 (*)    BUN 133 (*)    Creatinine, Ser 6.98 (*)    Calcium 8.3 (*)    GFR calc non Af Amer 5 (*)    GFR calc Af Amer 6 (*)    All other components within normal limits  CBC - Abnormal; Notable for the following:    RBC 2.89 (*)    Hemoglobin 8.6 (*)    HCT 26.6 (*)    RDW 16.8 (*)    All other components within normal limits  BASIC METABOLIC PANEL  POTASSIUM  POTASSIUM  POTASSIUM  NA AND K (SODIUM & POTASSIUM), RAND UR  CREATININE, URINE, RANDOM  OSMOLALITY, URINE  OSMOLALITY  I-STAT TROPOININ, ED    BUN  Date Value Ref Range Status  06/25/2016 133 (H) 6 - 20 mg/dL Final    Comment:    RESULTS CONFIRMED BY  MANUAL DILUTION  06/13/2016 52 (H) 6 - 20 mg/dL Final  06/12/2016 56 (H) 6 - 20 mg/dL Final  06/11/2016 56 (H) 6 - 20 mg/dL Final  01/04/2016 25.7 7.0 - 26.0 mg/dL Final   Creatinine  Date Value Ref Range Status  01/04/2016 1.9 (H) 0.6 - 1.1 mg/dL Final   Creatinine, Ser  Date Value Ref Range Status  06/25/2016 6.98 (H) 0.44 - 1.00 mg/dL Final  06/13/2016 2.14 (H) 0.44 - 1.00 mg/dL Final  06/12/2016 2.20 (H) 0.44 - 1.00 mg/dL Final  06/11/2016 2.45 (H) 0.44 - 1.00 mg/dL Final   Hemoglobin  Date Value Ref Range Status  06/25/2016 8.6 (L) 12.0 - 15.0 g/dL Final  06/11/2016 9.2 (L) 12.0 - 15.0 g/dL Final  06/10/2016 9.1 (L) 12.0 - 15.0 g/dL Final  06/09/2016 9.6 (L) 12.0 - 15.0 g/dL Final   HGB  Date Value Ref Range Status  12/08/2011 12.7 11.6 - 15.9 g/dL Final    EKG  EKG Interpretation  Date/Time:  Wednesday June 25 2016 12:23:08 EST Ventricular Rate:  38 PR Interval:    QRS Duration: 104 QT Interval:  451 QTC Calculation: 359 R Axis:   61 Text Interpretation:  Atrial fibrillation Repol abnrm, severe global ischemia (LM/MVD) Since last tracing rate slower and  Atrial fibrillation is new Confirmed by Eulis Foster  MD, Vira Agar ([id number]) on 06/25/2016 1:32:05 PM Also confirmed by Eulis Foster  MD, Jadier Rockers [phone]), editor [place] CT, Leda Gauze [phone])  on 06/25/2016 1:46:23 PM       Radiology Dg Chest 2 View  Result Date: 06/25/2016 CLINICAL DATA:  Shortness of breath and cough. EXAM: CHEST  2 VIEW COMPARISON:  06/08/2016 FINDINGS: The cardio pericardial silhouette is enlarged. There is pulmonary vascular congestion with possible interstitial pulmonary edema. Bibasilar collapse/ consolidation with bilateral pleural effusions, right greater than left. The visualized bony structures of the thorax are intact. Telemetry leads overlie the chest. IMPRESSION: Cardiomegaly with vascular congestion and bibasilar collapse/ consolidation.  Small to moderate bilateral pleural effusions, mildly progressed  in the interval since the prior study. Electronically Signed   By: Misty Stanley M.D.   On: 06/25/2016 14:02    Procedures Procedures (including critical care time)  Medications Ordered in ED Medications  insulin aspart (novoLOG) injection 10 Units (not administered)  dextrose 50 % solution 50 mL (not administered)  sodium bicarbonate 100 mEq in dextrose 5 % 1,000 mL infusion (not administered)  sodium polystyrene (KAYEXALATE) 15 GM/60ML suspension 30 g (not administered)  albuterol (PROVENTIL) (2.5 MG/3ML) 0.083% nebulizer solution 10 mg (10 mg Nebulization Given 06/25/16 1452)     Initial Impression / Assessment and Plan / ED Course  I have reviewed the triage vital signs and the nursing notes.  Pertinent labs & imaging results that were available during my care of the patient were reviewed by me and considered in my medical decision making (see chart for details).  Clinical Course as of Jun 26 1543  Wed Jun 25, 2016  1442 Low Sodium: (!) 129 [EW]  1443 High Potassium: (!!) 6.5 [EW]  1443 High Creatinine: (!) 6.98 [EW]  1443 EGFR (Non-African Amer.): (!) 5 [EW]  1505 low Hemoglobin: (!) 8.6 [EW]  1539 Bladder scan reported showing 46 cc, prior to in and out catheterization.  [EW]    Clinical Course User Index [EW] Daleen Bo, MD    Medications  insulin aspart (novoLOG) injection 10 Units (not administered)  dextrose 50 % solution 50 mL (not administered)  sodium bicarbonate 100 mEq in dextrose 5 % 1,000 mL infusion (not administered)  sodium polystyrene (KAYEXALATE) 15 GM/60ML suspension 30 g (not administered)  albuterol (PROVENTIL) (2.5 MG/3ML) 0.083% nebulizer solution 10 mg (10 mg Nebulization Given 06/25/16 1452)    Patient Vitals for the past 24 hrs:  BP Temp Temp src Pulse Resp SpO2 Height Weight  06/25/16 1500 112/66 - - (!) 42 17 94 % - -  06/25/16 1458 - - - - - 93 % - -  06/25/16 1419 (!) 122/36 - - (!) 59 16 98 % - -  06/25/16 1343 (!) 77/54 - - (!) 41 15  95 % - -  06/25/16 1237 - - - - - - _0  (1.651 m) 190 lb (86.2 kg)  06/25/16 1236 98/58 (!) 96.1 F (35.6 C) Rectal (!) 42 20 (!) 88 % - -  06/25/16 1224 - - - - - 91 % - -    This patients CHA2DS2-VASc Score and unadjusted Ischemic Stroke Rate (% per year) is equal to 4.8 % stroke rate/year from a score of 4  Above score calculated as 1 point each if present [CHF, HTN, DM, Vascular=MI/PAD/Aortic Plaque, Age if 65-74, or Female] Above score calculated as 2 points each if present [Age > 75, or Stroke/TIA/TE]     3:42 PM Reevaluation with update and discussion. After initial assessment and treatment, an updated evaluation reveals no change in clinical status. Patient and son updated on findings and plan. Ercil Cassis L   3:43 PM-Consult complete with Hospitalist. Patient case explained and discussed. He agrees to admit patient for further evaluation and treatment. Call ended at 15:45  CRITICAL CARE Performed by: Richarda Blade Total critical care time: 45 minutes Critical care time was exclusive of separately billable procedures and treating other patients. Critical care was necessary to treat or prevent imminent or life-threatening deterioration. Critical care was time spent personally by me on the following activities: development of treatment plan with patient and/or surrogate as  well as nursing, discussions with consultants, evaluation of patient's response to treatment, examination of patient, obtaining history from patient or surrogate, ordering and performing treatments and interventions, ordering and review of laboratory studies, ordering and review of radiographic studies, pulse oximetry and re-evaluation of patient's condition.  Final Clinical Impressions(s) / ED Diagnoses   Final diagnoses:  AKI (acute kidney injury) ([place])  Hyperkalemia  Atrial fibrillation, unspecified type (H[facility])  Bradycardia    Acute kidney injury, cause not clear. Apparently new onset atrial  fibrillation, with bradycardia. Transient hypotension, improved with IV fluids.  Nursing Notes Reviewed/ Care Coordinated, and agree without changes. Applicable Imaging Reviewed.  Interpretation of Laboratory Data incorporated into ED treatment  Plan: Admit  New Prescriptions New Prescriptions   No medications on file     Daleen Bo, MD 06/25/16 [place], MD 06/25/16 [phone]

## 2016-06-25 NOTE — Consult Note (Signed)
Debbie [name] Admit Date: 06/25/2016 06/25/2016 Rexene Agent Requesting Physician:  Arrien MD  Reason for Consult:  AoCKD, Hyperkalemia HPI:  72F admitted to [facility] today and seen by Dr. Cathlean Sauer for the above  PMH Incudes:  Recent admission for COPD/HCAP/influenza,   DM2 on metformin  HTN on diuretic and ACEi  COPD on chronic O2  dCHF  Left last hospitalization to SNF -- [facility].  Had labs done earlier this week and by chart revie K was elevated to 6.8.  Told to come to ED.  Has had poor PO of solids/liquids at SNF. Denies diarrhea, N/V, dysuria, LUTS, lower abdominal pain.  No clear NSAID or IV contrast exposures  Admitted with K in 6s, SCr as below, and cont/worsened dyspnea. HR in 30s and some transient hypotension.  Admitted, diuretics + ACEi held and given medical mgmt of hyperK but K up to 6.8 now.  EKG is AFib with SVR, T waves ok.  QRS WNL. CXR with b/l pleural effusions and congestion. Has chronic LEE.  If weights are correct is 5kg above discarge weight.    Pt in room with son.  In NAD, able to converse but admits to feeling worse.    Creatinine (mg/dL)  Date Value  01/04/2016 1.9 (H)   Creatinine, Ser (mg/dL)  Date Value  06/25/2016 6.63 (H)  06/25/2016 6.98 (H)  06/13/2016 2.14 (H)  06/12/2016 2.20 (H)  06/11/2016 2.45 (H)  06/10/2016 2.27 (H)  06/09/2016 2.09 (H)  06/08/2016 1.70 (H)  06/08/2016 1.51 (H)  06/01/2016 2.18 (H)  ] I/Os:  ROS NSAIDS: none identified IV Contrast none TMP/SMX none identified Hypotension none Balance of 12 systems is negative w/ exceptions as above  PMH  Past Medical History:  Diagnosis Date  . Allergy   . Anxiety   . Arthritis   . Cancer ([place]) dx'd 12/2007   endometroid adenocarcinoma  . Cataract    both  . Diabetes mellitus    fasting cbgs100-120  . Dizziness   . History of radiation therapy 9/21,9/28,10/05,05/20/2008   4 txs 2400 cGy endometrial adenocarcinoma  . Hypertension   . Peripheral vascular  disease ([place])   . Seasonal allergies   . 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 UTI (urinary tract infection) 05/2016   PSH  Past Surgical History:  Procedure Laterality Date  . ABDOMINAL HYSTERECTOMY    . APPENDECTOMY    . ENDARTERECTOMY Left 01/12/2014   Procedure: LEFT CAROTID ARTERY ENDARTERECTOMY WITH HEMASHIELD PATCH ANGIOPLASTY;  Surgeon: Mal Misty, MD;  Location: [facility];  Service: Vascular;  Laterality: Left;  . ENDARTERECTOMY Right 03/17/2014   Procedure: RIGHT CAROTD ARTERY ENDARTERECTOMY WITH DACRON PATCH ANGIOPLASTY;  Surgeon: Mal Misty, MD;  Location: [facility];  Service: Vascular;  Laterality: Right;  . EYE SURGERY Bilateral    cataracts  . NASAL SEPTUM SURGERY    . NECK SURGERY Bilateral    arterial  . TONSILLECTOMY     FH  Family History  Problem Relation Age of Onset  . Actinic keratosis Mother   . Cancer Mother     uterine  . Heart disease Father   . Hyperlipidemia Father   . Hypertension Father   . Stroke Father   . Cancer Sister     lung   SH  reports that she has never smoked. She has never used smokeless tobacco. She reports that she does not drink alcohol or use drugs. Allergies  Allergies  Allergen Reactions  . Codeine  Other (See Comments)    Unusual mouth sensation   Home medications Prior to Admission medications   Medication Sig Start Date End Date Taking? Authorizing Provider  amLODipine (NORVASC) 10 MG tablet Take 1 tablet (10 mg total) by mouth daily. 06/16/16  Yes Charlynne Cousins, MD  aspirin EC 81 MG tablet Take 81 mg by mouth daily.   Yes Historical Provider, MD  aspirin EC 81 MG tablet Take 81 mg by mouth daily.   Yes Historical Provider, MD  atenolol (TENORMIN) 25 MG tablet Take 25 mg by mouth 2 (two) times daily.   Yes Historical Provider, MD  atorvastatin (LIPITOR) 20 MG tablet Take 20 mg by mouth daily. 05/26/16  Yes Historical Provider, MD  furosemide (LASIX) 20 MG tablet Take 20 mg by mouth daily.   Yes Historical  Provider, MD  hydrALAZINE (APRESOLINE) 25 MG tablet Take 1 tablet (25 mg total) by mouth 3 (three) times daily. 06/16/16  Yes Charlynne Cousins, MD  insulin aspart (NOVOLOG) 100 UNIT/ML injection Inject 1-7 Units into the skin 3 (three) times daily as needed for high blood sugar. 101-150: 1 unit, 151-200: 2 units, 201-250=3 units, 251-300: 5 units, 301-350: 7 units, >350: 9 units, Call MD for BS>400 or <60   Yes Historical Provider, MD  ipratropium-albuterol (DUONEB) 0.5-2.5 (3) MG/3ML SOLN Take 3 mLs by nebulization every 4 (four) hours as needed (sob).   Yes Historical Provider, MD  lisinopril (PRINIVIL,ZESTRIL) 5 MG tablet Take 2.5 mg by mouth daily.   Yes Historical Provider, MD  metFORMIN (GLUCOPHAGE-XR) 500 MG 24 hr tablet Take 500 mg by mouth daily with breakfast.   Yes Historical Provider, MD  mometasone-formoterol (DULERA) 100-5 MCG/ACT AERO Inhale 2 puffs into the lungs 2 (two) times daily. 06/02/16  Yes Doreatha Lew, MD  Multiple Vitamin (MULTIVITAMIN) tablet Take 1 tablet by mouth daily. Centrum silver   Yes Historical Provider, MD  predniSONE (DELTASONE) 20 MG tablet Take 40 mg by mouth daily with breakfast.   Yes Historical Provider, MD  sodium chloride (OCEAN) 0.65 % SOLN nasal spray Place 1 spray into both nostrils as needed for congestion.   Yes Historical Provider, MD  Albuterol Sulfate (PROAIR RESPICLICK) [id number] (90 Base) MCG/ACT AEPB Inhale 1-2 puffs into the lungs every 4 (four) hours as needed. 06/02/16   Doreatha Lew, MD  levofloxacin (LEVAQUIN) 500 MG tablet Take 1 tablet (500 mg total) by mouth every other day. Patient not taking: Reported on 06/25/2016 06/14/16   Charlynne Cousins, MD    Current Medications Scheduled Meds: . [START ON 06/26/2016] aspirin EC  81 mg Oral Daily  . heparin  5,000 Units Subcutaneous Q8H  . [START ON 06/26/2016] insulin aspart  0-9 Units Subcutaneous TID WC  . mometasone-formoterol  2 puff Inhalation BID  . [START ON 06/26/2016]  multivitamin with minerals  1 tablet Oral Daily  . sodium chloride  500 mL Intravenous Once  . sodium polystyrene  30 g Oral Q4H   Continuous Infusions: . sodium chloride     PRN Meds:.acetaminophen **OR** acetaminophen, ondansetron **OR** ondansetron (ZOFRAN) IV, sodium chloride  CBC  Recent Labs Lab 06/25/16 1320 06/25/16 1815  WBC 10.5 9.6  HGB 8.6* 8.6*  HCT 26.6* 25.5*  MCV 92.0 89.8  PLT 270 [id number]   Basic Metabolic Panel  Recent Labs Lab 06/25/16 1320 06/25/16 1815  NA 129*  --   K 6.5* 6.8*  CL 93*  --   CO2 23  --   GLUCOSE  119*  --   BUN 133*  --   CREATININE 6.98* 6.63*  CALCIUM 8.3*  --     Physical Exam  Blood pressure (!) 129/44, pulse (!) 45, temperature 97.6 F (36.4 C), temperature source Axillary, resp. rate 19, height 5\' 5"  (1.651 m), weight 86.2 kg (190 lb), SpO2 94 %. GEN: Chronically Ill appearing, supplemntal O2 ENT: NCAT EYES: EOMI CV: bradycardic, irregular, no rub PULM: diminished throught, bronchial at bases ABD: s/nt/nd, no SP discomfort SKIN: no rashes/lesions EXT: 3+ LEE   Assessment 43F with AoCKD3 and Hyperkalemia, not imrpved with medical therapy; having some bradycardia with AFib on BB  1. AoCKD3, likely 2/2 diuretics + ACEi, obstruction not excluded 2. Hyperkalemia 2/2 #1 and ACEi use 3. Bradycardia, SVR in setting of AFib on BB 4. COPD, recent HCAP, pulm congestion and effusions 5. Hypervolemia 6. HTN on atenolol and amlopdipine 7. DM2 8. Hypotension at admission  Plan 1. Agree with [facility] transfer,  2. CRRT tonight after HD cath placed by CCM, son/patient in agreement; hopefully this is acute and temporary: Start all 4K, 163mL/hr UF BP permitting, no heparin 3. Renal US 4. UA 5. Stop IVFs   Pearson Grippe MD [phone] [phone] 06/25/2016, 9:35 PM

## 2016-06-25 NOTE — ED Notes (Signed)
Bed: [id number] Expected date:  Expected time:  Means of arrival:  Comments: EMS- elderly from facility, shortness of breath

## 2016-06-25 NOTE — ED Notes (Signed)
WILL TRANSPORT PT TO 2W 1226-1. AAOX4. PT IN NO APPARENT DISTRESS OR PAIN. THE OPPORTUNITY TO ASK QUESTIONS WAS PROVIDED.

## 2016-06-25 NOTE — Consult Note (Signed)
PULMONARY / CRITICAL CARE MEDICINE   Name: Debbie [name] MRN: [id number] DOB: 27-Oct-1930    ADMISSION DATE:  06/25/2016 CONSULTATION DATE:  06/25/16  REFERRING MD:  Cathlean Sauer - TRH  CHIEF COMPLAINT:  SOB  HISTORY OF PRESENT ILLNESS:  Debbie [name] is a 83 y.o. female with PMH as outlined below and who resides in SNF.  She presented to [facility] ED 01/17 with dyspnea and worsening renal function.  She had recent admission (06/08/16 through 06/13/16) for AECOPD, influenza A, possible HCAP and was discharged on steroids, abx, as well as diuretics (first furosemide then switched to torsemide).  She had apparently been doing fine up until 01/17 when she had worsening hypoxia along with hypotension (initial BP 77/54).  In ED, she was found to have AoCKD, hyperkalemia (K 6.5), bradycardia, and hypotension.  She was initially admitted by [facility] to SDU but after she had persistent hypotension, PCCM was consulted.  She was transferred to [facility] for further evaluation and consideration for urgent HD.   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  PAST SURGICAL HISTORY: She  has a past surgical history that includes Tonsillectomy; Nasal septum surgery; Eye surgery (Bilateral); Abdominal hysterectomy; Appendectomy; Endarterectomy (Left, 01/12/2014); Endarterectomy (Right, 03/17/2014); and Neck surgery (Bilateral).  Allergies  Allergen Reactions  . Codeine Other (See Comments)    Unusual mouth sensation    No current facility-administered medications on file prior to encounter.    Current Outpatient Prescriptions on File Prior to Encounter  Medication Sig  . amLODipine (NORVASC)  10 MG tablet Take 1 tablet (10 mg total) by mouth daily.  [name] aspirin EC 81 MG tablet Take 81 mg by mouth daily.  [name] atorvastatin (LIPITOR) 20 MG tablet Take 20 mg by mouth daily.  . hydrALAZINE (APRESOLINE) 25 MG tablet Take 1 tablet (25 mg total) by mouth 3 (three) times daily.  . mometasone-formoterol (DULERA) 100-5 MCG/ACT AERO Inhale 2 puffs into the lungs 2 (two) times daily.  . Multiple Vitamin (MULTIVITAMIN) tablet Take 1 tablet by mouth daily. Centrum silver  . sodium chloride (OCEAN) 0.65 % SOLN nasal spray Place 1 spray into both nostrils as needed for congestion.  . Albuterol Sulfate (PROAIR RESPICLICK) 123XX123 (90 Base) MCG/ACT AEPB Inhale 1-2 puffs into the lungs every 4 (four) hours as needed.  [name] atenolol (TENORMIN) 50 MG tablet Take 1 tablet (50 mg total) by mouth 2 (two) times daily. (Patient taking differently: Take 50 mg by mouth daily. )  . glimepiride (AMARYL) 4 MG tablet Take 4 mg by mouth daily.  [name] levofloxacin (LEVAQUIN) 500 MG tablet Take 1 tablet (500 mg total) by mouth every other day.  . linagliptin (TRADJENTA) 5 MG TABS tablet Take 1 tablet (5 mg total) by mouth daily.  [name] lisinopril (PRINIVIL,ZESTRIL) 2.5 MG tablet Take 1 tablet (2.5 mg total) by mouth daily.  . metFORMIN (GLUCOPHAGE) 1000 MG tablet Take 1 tablet (1,000 mg total) by mouth daily with breakfast.  . predniSONE (DELTASONE) 10 MG tablet Takes 6 tablets for 1 days, then 5 tablets for 1 days, then 4 tablets for 1 days, then 3 tablets for 1 days, then 2 tabs for 1 days, then 1 tab for 1 days, and then stop.  [name] torsemide (DEMADEX) 10 MG tablet Take  1 tablet (10 mg total) by mouth daily.    FAMILY HISTORY:  Her indicated that her mother is deceased. She indicated that her father is deceased. She indicated that her sister is deceased.    SOCIAL HISTORY: She  reports that she has never smoked. She has never used smokeless tobacco. She reports that she does not drink alcohol or use drugs.  REVIEW OF SYSTEMS:   All  negative; except for those that are bolded, which indicate positives.  Constitutional: weight loss, weight gain, night sweats, fevers, chills, fatigue, weakness.  HEENT: headaches, sore throat, sneezing, nasal congestion, post nasal drip, difficulty swallowing, tooth/dental problems, visual complaints, visual changes, ear aches. Neuro: difficulty with speech, weakness, numbness, ataxia. CV:  chest pain, orthopnea, PND, swelling in lower extremities, dizziness, palpitations, syncope.  Resp: cough, hemoptysis, dyspnea, wheezing. GI: heartburn, indigestion, abdominal pain, nausea, vomiting, diarrhea, constipation, change in bowel habits, loss of appetite, hematemesis, melena, hematochezia.  GU: dysuria, change in color of urine, urgency or frequency, flank pain, hematuria. MSK: joint pain or swelling, decreased range of motion. Psych: change in mood or affect, depression, anxiety, suicidal ideations, homicidal ideations. Skin: rash, itching, bruising.   SUBJECTIVE:  Dyspnea is improved from when first admitted. Denies chest pain.  VITAL SIGNS: BP 121/58 (BP Location: Right Arm)   Pulse (!) 50   Temp 97.1 F (36.2 C) (Oral)   Resp 17   Ht 5\' 5"  (1.651 m)   Wt 190 lb (86.2 kg)   SpO2 94%   BMI 31.62 kg/m   HEMODYNAMICS:    VENTILATOR SETTINGS:    INTAKE / OUTPUT: No intake/output data recorded.   PHYSICAL EXAMINATION: General: Elderly female, resting in bed, in NAD. Neuro: A&O x 3, non-focal.  HEENT: [place]/AT. PERRL, sclerae anicteric. Cardiovascular: Loletha Grayer, regular, no M/R/G.  Lungs: Respirations even and unlabored.  CTA bilaterally, No W/R/R. Abdomen: BS x 4, soft, NT/ND.  Musculoskeletal: No gross deformities, no edema.  Skin: Intact, warm, no rashes.     LABS:  BMET  Recent Labs Lab 06/25/16 1320 06/25/16 1815  NA 129*  --   K 6.5* 6.8*  CL 93*  --   CO2 23  --   BUN 133*  --   CREATININE 6.98* 6.63*  GLUCOSE 119*  --     Electrolytes  Recent  Labs Lab 06/25/16 1320  CALCIUM 8.3*    CBC  Recent Labs Lab 06/25/16 1320 06/25/16 1815  WBC 10.5 9.6  HGB 8.6* 8.6*  HCT 26.6* 25.5*  PLT 270 283    Coag's No results for input(s): APTT, INR in the last 168 hours.  Sepsis Markers No results for input(s): LATICACIDVEN, PROCALCITON, O2SATVEN in the last 168 hours.  ABG No results for input(s): PHART, PCO2ART, PO2ART in the last 168 hours.  Liver Enzymes No results for input(s): AST, ALT, ALKPHOS, BILITOT, ALBUMIN in the last 168 hours.  Cardiac Enzymes No results for input(s): TROPONINI, PROBNP in the last 168 hours.  Glucose No results for input(s): GLUCAP in the last 168 hours.  Imaging Dg Chest 2 View  Result Date: 06/25/2016 CLINICAL DATA:  Shortness of breath and cough. EXAM: CHEST  2 VIEW COMPARISON:  06/08/2016 FINDINGS: The cardio pericardial silhouette is enlarged. There is pulmonary vascular congestion with possible interstitial pulmonary edema. Bibasilar collapse/ consolidation with bilateral pleural effusions, right greater than left. The visualized bony structures of the thorax are intact. Telemetry leads overlie the chest. IMPRESSION: Cardiomegaly with vascular congestion and bibasilar collapse/ consolidation. Small to  moderate bilateral pleural effusions, mildly progressed in the interval since the prior study. Electronically Signed   By: Misty Stanley M.D.   On: 06/25/2016 14:02     STUDIES:  CXR 01/17 > CM, vascular congestion.  CULTURES: None.  ANTIBIOTICS: None.  SIGNIFICANT EVENTS: 01/17 > admit.  LINES/TUBES: None.  DISCUSSION: 83 y.o. female admitted 01/17 with AoCKD, hyperkalemia, hypotension.  Transferred to [facility] later that evening for PCCM to assume care and consideration urgent HD.  ASSESSMENT / PLAN:  RENAL A:   Hyponatremia - suspect due to diuretic therapy. Hyperkalemia - exacerbated by ACEi.  Now s/p temporizing measures at [facility], but K remains elevated. AoCKD. ?  Hypocalcemia - no albumin to correct for. P:   KVO fluids. Nephrology following, starting CRRT tonight. Assess ionized calcium. Repeat BMP tonight.  CARDIOVASCULAR A:  Hypotension and bradycardia - suspect primarily due to hyperkalemia. Hx HTN, PVD, dCHF (echo from Dec 2017 with EF 50-55%, G2DD). P:  Treat hyperkalemia as per renal section. Trend troponin. Continue preadmission ASA, atorvastatin. Hold preadmission amlodipine, atenolol, furosemide, hydralazine, lisinopril, torsemide.  PULMONARY A: Acute on chronic hypoxic respiratory failure - reportedly on chronic O2 for COPD (no PFT's in system). COPD per report - no PFT's in system. P:   Continue supplemental O2 as needed to maintain SpO2 > 92%. Budesonide / Brovana in lieu of preadmission dulera. Albuterol PRN. Pulmonary hygiene. CXR intermittently.  GASTROINTESTINAL A:   Nutrition. P:   NPO.  HEMATOLOGIC / ONCOLOGIC A:   Anemia of chronic disease. VTE Prophylaxis. Hx endometrial adenocarcinoma (2009). P:  Transfuse for Hgb < 7. SCD's / heparin. CBC in AM.  INFECTIOUS A:   No indication of infection. P:   Monitor clinically. Assess UA. Assess PCT - if high, start empiric abx.  ENDOCRINE A:   DM.  P:   SSI. Hold preadmission glimepiride, linagliptin, metformin.  NEUROLOGIC A:   Hx anxiety. P:   No interventions required.  Family updated: None available.  Interdisciplinary Family Meeting v Palliative Care Meeting:  Due by: 07/01/16.  CC time: 35 min.   Montey Hora, [place] [facility] Pager: [phone]  or [phone] 06/25/2016, 11:13 PM  Attending Note:  I have examined patient, reviewed labs, studies and notes. I have discussed the case with Junius Roads, and I agree with the data and plans as amended above. 83 yo woman with hx COPD, DM, HTN, PVD, endometrial CA. Recently admitted for AE-COPD in setting Flu A and possible HCAP (through 1/5) to  complete steroids taper, abx. Also sent on diuretics. She was noted to be hypotensive 1/17, associated with hypoxemia and dyspnea. Initial eval significant for shock and acute renal failure with hyperkalemia.  HyperK was treated medically and IVF were given with some stabilization in BP. She was transferred to [facility] for urgent HD. On my exam she is stable, comfortable on [place] O2. She is a bit confused but re-orients. Moves all ext. Lungs clear without crackles (after 6 hours CVVHD). HD not currently running, to be restarted. Her BP on CVVHD was stable, lowest to 90's. She has had some transient periods bradycardia. Repeat K 4.8 this am. Will restart CVVHD per renal's plans, hold her home diuretics and antihypertensive meds. No empiric abx for now - follow clinically. Her pct was 0.45. Hopefully can d/c CVVHD and follow for renal recovery, use iHD if required further.  Independent critical care time is 40 minutes.   Herbie Baltimore  Lamonte Sakai, MD, PhD 06/26/2016, 6:42 AM [place] [facility] [phone] or if no answer [phone]

## 2016-06-25 NOTE — Progress Notes (Signed)
2200- [facility] to unit for pt. Transport to [facility]. Pt. VSS , having no pain or increased WOB. Report given to [facility] and also called to receiving RN on 72M. Pt.care assumed by [facility] personnel.

## 2016-06-25 NOTE — Progress Notes (Signed)
[facility] Progress Note Patient Name: Debbie [name] DOB: Sep 20, 1930 MRN: [id number]   Date of Service  06/25/2016  HPI/Events of Note  New consult from hospitalist service regarding bradycardia, borderline blood pressure, and acute renal failure with hyperkalemia. Nephrology consulted. Patient has received IV fluid but limited urine output. Patient has received Kayexalate. Calcium chloride as ordered. Camera check shows patient laying or common in bed awake and appears alert. No distress.   eICU Interventions  1. Transferring to ICU at [facility] 2. Continuing current plan of care with calcium gluconate already ordered 3. Assessment by intensivist upon arrival to the ICU      Intervention Category Major Interventions: Arrhythmia - evaluation and management  Tera Partridge 06/25/2016, 6:06 PM

## 2016-06-25 NOTE — H&P (Signed)
History and Physical    Debbie [name] [id number] DOB: 1931-01-15 DOA: 06/25/2016  PCP: Myriam Jacobson, MD   Patient coming from: Skilled nursing facility  Chief Complaint:  Dyspnea and hyperkalemia  HPI: Debbie [name] is a 83 y.o. female with medical history significant of diabetes and hypertension, who presents from the skill nursing facility due to dyspnea and hyperkalemia. Patient has been recently discharge from the hospital 12/31 after being treated for copd exacerbation and health care associated pneumonia. Over last 24 to 48 hours patient has noticed worsening dyspnea to the point where she is having symptoms with minimal efforts. Dyspnea moderate to severe intensity, worse with exertion, improved wit supplemental oxygen, associated with decreased appetite and lower extremity edema. No fevers or chills.  Patient has been on diuretic therapy for the last 4 weeks for fluid retention, refractive to furosemide and changed to torsemide.   History limited to the patient's acute condition, most information obtained from her son at the bedside  ED Course: Kayexalate and IV fluids.  Review of Systems:  1. General. No fevers no chills, positive malaise 2. ENT no runny nose or sore throat 3. Pulmonary positive for shortness of breath, no wheezing, cough or hemoptysis 4. Cardiovascular: No angina, no claudication, no orthopnea 5. Hematology no easy prescription for the infections 6. Urology noted decreased urine output, no dysuria 7. Endocrine, no tremors, heat or cold intolerance 8. Urology no seizures or prestigious 9. Dermatology no rashes 10.Skeletal no joint pain  Past Medical History:  Diagnosis Date  . Allergy   . Anxiety   . Arthritis   . Cancer ([place]) dx'd 12/2007   endometroid adenocarcinoma  . Cataract    both  . Diabetes mellitus    fasting [id number]  . Dizziness   . History of radiation therapy 9/21,9/28,10/05,05/20/2008   4 txs 2400 cGy  endometrial adenocarcinoma  . Hypertension   . Peripheral vascular disease ([place])   . Seasonal allergies   . 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 UTI (urinary tract infection) 05/2016    Past Surgical History:  Procedure Laterality Date  . ABDOMINAL HYSTERECTOMY    . APPENDECTOMY    . ENDARTERECTOMY Left 01/12/2014   Procedure: LEFT CAROTID ARTERY ENDARTERECTOMY WITH HEMASHIELD PATCH ANGIOPLASTY;  Surgeon: Mal Misty, MD;  Location: [facility];  Service: Vascular;  Laterality: Left;  . ENDARTERECTOMY Right 03/17/2014   Procedure: RIGHT CAROTD ARTERY ENDARTERECTOMY WITH DACRON PATCH ANGIOPLASTY;  Surgeon: Mal Misty, MD;  Location: [facility];  Service: Vascular;  Laterality: Right;  . EYE SURGERY Bilateral    cataracts  . NASAL SEPTUM SURGERY    . NECK SURGERY Bilateral    arterial  . TONSILLECTOMY       reports that she has never smoked. She has never used smokeless tobacco. She reports that she does not drink alcohol or use drugs.  Allergies  Allergen Reactions  . Codeine Other (See Comments)    Unusual mouth sensation    Family History  Problem Relation Age of Onset  . Actinic keratosis Mother   . Cancer Mother     uterine  . Heart disease Father   . Hyperlipidemia Father   . Hypertension Father   . Stroke Father   . Cancer Sister     lung   Unacceptable: Noncontributory, unremarkable, or negative. Acceptable: Family history reviewed and not pertinent (If you reviewed it)  Prior to Admission medications   Medication Sig Start Date End Date  Taking? Authorizing Provider  Albuterol Sulfate (PROAIR RESPICLICK) [id number] (90 Base) MCG/ACT AEPB Inhale 1-2 puffs into the lungs every 4 (four) hours as needed. 06/02/16   Doreatha Lew, MD  amLODipine (NORVASC) 10 MG tablet Take 1 tablet (10 mg total) by mouth daily. 06/16/16   Charlynne Cousins, MD  aspirin EC 81 MG tablet Take 81 mg by mouth daily.    Historical Provider, MD  atenolol (TENORMIN) 50 MG tablet Take 1  tablet (50 mg total) by mouth 2 (two) times daily. Patient taking differently: Take 50 mg by mouth daily.  06/02/16   Doreatha Lew, MD  atorvastatin (LIPITOR) 20 MG tablet Take 20 mg by mouth daily. 05/26/16   Historical Provider, MD  glimepiride (AMARYL) 4 MG tablet Take 4 mg by mouth daily. 05/23/16   Historical Provider, MD  hydrALAZINE (APRESOLINE) 25 MG tablet Take 1 tablet (25 mg total) by mouth 3 (three) times daily. 06/16/16   Charlynne Cousins, MD  levofloxacin (LEVAQUIN) 500 MG tablet Take 1 tablet (500 mg total) by mouth every other day. 06/14/16   Charlynne Cousins, MD  linagliptin (TRADJENTA) 5 MG TABS tablet Take 1 tablet (5 mg total) by mouth daily. 06/02/16   Doreatha Lew, MD  lisinopril (PRINIVIL,ZESTRIL) 2.5 MG tablet Take 1 tablet (2.5 mg total) by mouth daily. 06/18/16   Charlynne Cousins, MD  metFORMIN (GLUCOPHAGE) 1000 MG tablet Take 1 tablet (1,000 mg total) by mouth daily with breakfast. 06/17/16   Charlynne Cousins, MD  mometasone-formoterol [facility]) 100-5 MCG/ACT AERO Inhale 2 puffs into the lungs 2 (two) times daily. 06/02/16   Doreatha Lew, MD  Multiple Vitamin (MULTIVITAMIN) tablet Take 1 tablet by mouth daily. Centrum silver    Historical Provider, MD  predniSONE (DELTASONE) 10 MG tablet Takes 6 tablets for 1 days, then 5 tablets for 1 days, then 4 tablets for 1 days, then 3 tablets for 1 days, then 2 tabs for 1 days, then 1 tab for 1 days, and then stop. 06/13/16   Charlynne Cousins, MD  sodium chloride (OCEAN) 0.65 % SOLN nasal spray Place 1 spray into both nostrils as needed for congestion.    Historical Provider, MD  torsemide (DEMADEX) 10 MG tablet Take 1 tablet (10 mg total) by mouth daily. 06/18/16   Charlynne Cousins, MD    Physical Exam: Vitals:   06/25/16 1343 06/25/16 1419 06/25/16 1458 06/25/16 1500  BP: (!) 77/54 (!) 122/36  112/66  Pulse: (!) 41 (!) 59  (!) 42  Resp: 15 16  17   Temp:      TempSrc:      SpO2: 95% 98% 93% 94%    Weight:      Height:          Constitutional: deconditioned and ill looking appearing.  Vitals:   06/25/16 1343 06/25/16 1419 06/25/16 1458 06/25/16 1500  BP: (!) 77/54 (!) 122/36  112/66  Pulse: (!) 41 (!) 59  (!) 42  Resp: 15 16  17   Temp:      TempSrc:      SpO2: 95% 98% 93% 94%  Weight:      Height:       Eyes: PERRL, lids and conjunctivae pale.  Head normocephalic, nose and ears no deformities.  ENMT: Mucous membranes are dry. Posterior pharynx clear of any exudate or lesions.Normal dentition.  Neck: normal, supple, no masses, no thyromegaly Respiratory: Decreased breath sounds bilaterally at bases, no wheezing, no crackles.  Normal respiratory effort. Mild accessory muscle use. Poor inspiratory effort.  Cardiovascular: Regular rate and rhythm, no murmurs / rubs / gallops. +++ pitting edema. 2+ pedal pulses. No carotid bruits. Moderate JVD.  Abdomen: no tenderness, no masses palpated. No hepatosplenomegaly. Bowel sounds positive.  Musculoskeletal: no clubbing / cyanosis. No joint deformity upper and lower extremities. Good ROM, no contractures. Normal muscle tone.  Skin: no rashes, lesions, ulcers. No induration Neurologic: CN 2-12 grossly intact. Sensation intact, DTR normal. Strength 5/5 in all 4.   Labs on Admission: I have personally reviewed following labs and imaging studies  CBC:  Recent Labs Lab 06/25/16 1320  WBC 10.5  HGB 8.6*  HCT 26.6*  MCV 92.0  PLT [id number]   Basic Metabolic Panel:  Recent Labs Lab 06/25/16 1320  NA 129*  K 6.5*  CL 93*  CO2 23  GLUCOSE 119*  BUN 133*  CREATININE 6.98*  CALCIUM 8.3*   GFR: Estimated Creatinine Clearance: 6.6 mL/min (by C-G formula based on SCr of 6.98 mg/dL (H)). Liver Function Tests: No results for input(s): AST, ALT, ALKPHOS, BILITOT, PROT, ALBUMIN in the last 168 hours. No results for input(s): LIPASE, AMYLASE in the last 168 hours. No results for input(s): AMMONIA in the last 168 hours. Coagulation  Profile: No results for input(s): INR, PROTIME in the last 168 hours. Cardiac Enzymes: No results for input(s): CKTOTAL, CKMB, CKMBINDEX, TROPONINI in the last 168 hours. BNP (last 3 results) No results for input(s): PROBNP in the last 8760 hours. HbA1C: No results for input(s): HGBA1C in the last 72 hours. CBG: No results for input(s): GLUCAP in the last 168 hours. Lipid Profile: No results for input(s): CHOL, HDL, LDLCALC, TRIG, CHOLHDL, LDLDIRECT in the last 72 hours. Thyroid Function Tests: No results for input(s): TSH, T4TOTAL, FREET4, T3FREE, THYROIDAB in the last 72 hours. Anemia Panel: No results for input(s): VITAMINB12, FOLATE, FERRITIN, TIBC, IRON, RETICCTPCT in the last 72 hours. Urine analysis:    Component Value Date/Time   COLORURINE YELLOW 05/28/2016 0848   APPEARANCEUR CLEAR 05/28/2016 0848   LABSPEC 1.008 05/28/2016 0848   PHURINE 6.0 05/28/2016 0848   GLUCOSEU 50 (A) 05/28/2016 0848   HGBUR NEGATIVE 05/28/2016 0848   BILIRUBINUR NEGATIVE 05/28/2016 0848   KETONESUR NEGATIVE 05/28/2016 0848   PROTEINUR 100 (A) 05/28/2016 0848   UROBILINOGEN 1.0 03/07/2014 1510   NITRITE POSITIVE (A) 05/28/2016 0848   LEUKOCYTESUR TRACE (A) 05/28/2016 0848   Sepsis Labs: !!!!!!!!!!!!!!!!!!!!!!!!!!!!!!!!!!!!!!!!!!!! @LABRCNTIP (procalcitonin:4,lacticidven:4) )No results found for this or any previous visit (from the past 240 hour(s)).   Radiological Exams on Admission: Dg Chest 2 View  Result Date: 06/25/2016 CLINICAL DATA:  Shortness of breath and cough. EXAM: CHEST  2 VIEW COMPARISON:  06/08/2016 FINDINGS: The cardio pericardial silhouette is enlarged. There is pulmonary vascular congestion with possible interstitial pulmonary edema. Bibasilar collapse/ consolidation with bilateral pleural effusions, right greater than left. The visualized bony structures of the thorax are intact. Telemetry leads overlie the chest. IMPRESSION: Cardiomegaly with vascular congestion and bibasilar  collapse/ consolidation. Small to moderate bilateral pleural effusions, mildly progressed in the interval since the prior study. Electronically Signed   By: Misty Stanley M.D.   On: 06/25/2016 14:02    EKG: Independently reviewed. Complete heart block with junctional rhythm  Assessment/Plan Active Problems:   Renal failure   This is an 83 year old female who comes from skilled nursing facility with worsening dyspnea and significant worsening renal function as well as hyperkalemia. Patient has been on steroids and antibiotics for  a recent hospitalization for COPD exacerbation/ healthcare associated pneumonia. She has been on furosemide and and then torsemide for fluid retention for the last 4 weeks. Since her discharge has been using supplemental oxygen per nasal cannula. On initial physical examination blood pressure was 77/54, pulse rate 41, respiratory rate15, oxygen saturation 95% on 6 L nasal cannula. With the IV fluids per pressure 122/36. Clinically patient looks volume overloaded with moderate JVD, decreased breath sounds, positive lower extremity edema. Sodium 139, potassium 6.5, chloride 93, bicarbonate 23, glucose 119, BUN 133, creatinine 6.98, calcium 8.3, a gap 13, white count 10.5, hemoglobin 8.6, hematocrit 26.6, platelets 270. Chest film personally reviewed showing bilateral pleural effusions and vascular congestion. Bladder scan with 40 mL of urine.   The patient will be admitted to the stepdown unit with the working diagnosis of acute renal failure complicated by volume overload, hyperkalemia and heart block..  1. Acute kidney injury on chronic kidney disease stage IV. Oliguric, volume overload, severe hyperkalemia. Likely acute tubular necrosis, note patient was hypotensive on admission. Patient may need urgent hemodialysis, nephrology consult stat. Patient will be admitted to the stepdown unit on a cardiac monitor, continue oximetry monitoring and oxygen supplementation per nasal  cannula.   2. Hyperkalemia. Consequences of acute kidney injury. Patient had Kayexalate prescribed, he also received sodium bicarbonate, insulin and dextrose. Will add calcium gluconate to stabilize membrane. Considering electrocardiographic changes patient may need urgent hemodialysis.  3. Heart block. Electrocardiogram personally reviewed showing irregular junctional rhythm, continue blood pressure monitoring. Hold on atenolol.  4. Volume overload. Diuresis limited due to hypotension, will withhold any further diuresis until case discussed with nephrology, patient may need ultrafiltration.  5. Hypotension. Hold atenolol and amlodipine.  6. Type 2 diabetes mellitus. Continue glucose coverage and monitoring with insulin sliding-scale. Hold on all oral hypoglycemic agents.   DVT prophylaxis: heparin sq Code Status: Full  Family Communication: I spoke with patient's son at the bedside and all questions were addressed. Disposition Plan: SNF Consults called: Nephrology  Admission status: Inpatient.   Jermall Isaacson Gerome Apley MD [facility] Pager [phone]  If 7PM-7AM, please contact night-coverage [email or web address] Password [email or web address]  06/25/2016, 4:08 PM

## 2016-06-26 ENCOUNTER — Inpatient Hospital Stay (HOSPITAL_COMMUNITY): Payer: Medicare HMO

## 2016-06-26 DIAGNOSIS — E875 Hyperkalemia: Secondary | ICD-10-CM

## 2016-06-26 DIAGNOSIS — I4891 Unspecified atrial fibrillation: Secondary | ICD-10-CM

## 2016-06-26 DIAGNOSIS — N170 Acute kidney failure with tubular necrosis: Principal | ICD-10-CM

## 2016-06-26 DIAGNOSIS — N179 Acute kidney failure, unspecified: Secondary | ICD-10-CM

## 2016-06-26 DIAGNOSIS — R001 Bradycardia, unspecified: Secondary | ICD-10-CM

## 2016-06-26 LAB — RENAL FUNCTION PANEL
ALBUMIN: 2.7 g/dL — AB (ref 3.5–5.0)
Anion gap: 10 (ref 5–15)
BUN: 55 mg/dL — AB (ref 6–20)
CALCIUM: 8 mg/dL — AB (ref 8.9–10.3)
CO2: 25 mmol/L (ref 22–32)
CREATININE: 3.59 mg/dL — AB (ref 0.44–1.00)
Chloride: 103 mmol/L (ref 101–111)
GFR calc Af Amer: 13 mL/min — ABNORMAL LOW (ref >60)
GFR calc non Af Amer: 11 mL/min — ABNORMAL LOW (ref >60)
Glucose, Bld: 105 mg/dL — ABNORMAL HIGH (ref 65–99)
PHOSPHORUS: 4.7 mg/dL — AB (ref 2.5–4.6)
Potassium: 4.9 mmol/L (ref 3.5–5.1)
SODIUM: 138 mmol/L (ref 135–145)

## 2016-06-26 LAB — URINALYSIS, ROUTINE W REFLEX MICROSCOPIC
Bilirubin Urine: NEGATIVE
GLUCOSE, UA: 50 mg/dL — AB
Hgb urine dipstick: NEGATIVE
KETONES UR: NEGATIVE mg/dL
Nitrite: NEGATIVE
PH: 5 (ref 5.0–8.0)
Protein, ur: 100 mg/dL — AB
SPECIFIC GRAVITY, URINE: 1.013 (ref 1.005–1.030)

## 2016-06-26 LAB — GLUCOSE, CAPILLARY
GLUCOSE-CAPILLARY: 128 mg/dL — AB (ref 65–99)
GLUCOSE-CAPILLARY: 152 mg/dL — AB (ref 65–99)
Glucose-Capillary: 109 mg/dL — ABNORMAL HIGH (ref 65–99)
Glucose-Capillary: 97 mg/dL (ref 65–99)
Glucose-Capillary: 116 mg/dL — ABNORMAL HIGH (ref 65–99)
Glucose-Capillary: 98 mg/dL (ref 65–99)

## 2016-06-26 LAB — BASIC METABOLIC PANEL
ANION GAP: 12 (ref 5–15)
BUN: 88 mg/dL — AB (ref 6–20)
CHLORIDE: 100 mmol/L — AB (ref 101–111)
CO2: 24 mmol/L (ref 22–32)
Calcium: 7.9 mg/dL — ABNORMAL LOW (ref 8.9–10.3)
Creatinine, Ser: 4.95 mg/dL — ABNORMAL HIGH (ref 0.44–1.00)
GFR calc Af Amer: 8 mL/min — ABNORMAL LOW (ref >60)
GFR, EST NON AFRICAN AMERICAN: 7 mL/min — AB (ref >60)
GLUCOSE: 108 mg/dL — AB (ref 65–99)
Potassium: 4.8 mmol/L (ref 3.5–5.1)
Sodium: 136 mmol/L (ref 135–145)

## 2016-06-26 LAB — TROPONIN I
TROPONIN I: 0.04 ng/mL — AB (ref <0.03)
Troponin I: 0.04 ng/mL (ref <0.03)

## 2016-06-26 LAB — NA AND K (SODIUM & POTASSIUM), RAND UR
Potassium Urine: 41 mmol/L
Sodium, Ur: <10 mmol/L

## 2016-06-26 LAB — PROCALCITONIN: PROCALCITONIN: 0.45 ng/mL

## 2016-06-26 LAB — OSMOLALITY: OSMOLALITY: 322 mosm/kg — AB (ref 275–295)

## 2016-06-26 LAB — POTASSIUM
POTASSIUM: 4.9 mmol/L (ref 3.5–5.1)
POTASSIUM: 4.7 mmol/L (ref 3.5–5.1)

## 2016-06-26 LAB — OSMOLALITY, URINE: Osmolality, Ur: 334 mOsm/kg (ref 300–900)

## 2016-06-26 LAB — CREATININE, URINE, RANDOM: Creatinine, Urine: 122.9 mg/dL

## 2016-06-26 NOTE — Progress Notes (Signed)
[facility] Progress Note    Assessment/ Plan:     1. AoCKD3, likely 2/2 diuretics + ACEi, no obstruction on renal US- CRRT started overnight, tolerating well, will continue Uf; increase to 1110mL/ hr if able 2. Hyperkalemia 2/2 #1 and ACEi use- resolving with CRRT 3. Bradycardia, SVR in setting of AFib on BB- bradycardia improved 4. COPD, recent HCAP, pulm congestion and effusions 5. Hypervolemia 6. HTN on atenolol and amlodipine 7. DM2 8. Hypotension at admission- hypothermic as well, ? sepsis  Subjective:    Started CRRT overnight for AKI and refractory hyperkalemia.  Still bradycardic this AM to the 50s-60s.   Objective:   BP (!) 103/59   Pulse (!) 35   Temp (!) 94.3 F (34.6 C) (Oral)   Resp 20   Ht 5\' 5"  (1.651 m)   Wt 86.3 kg (190 lb 4.1 oz)   SpO2 98%   BMI 31.66 kg/m   Intake/Output Summary (Last 24 hours) at 06/26/16 0740 Last data filed at 06/26/16 0700  Gross per 24 hour  Intake              590 ml  Output              313 ml  Net              277 ml   Weight change:   Physical Exam: GEN: Chronically Ill appearing, supplemntal O2, sleeping but arousable ENT: NCAT EYES: EOMI CV: bradycardic, irregular, no rub PULM: diminished throught, bronchial at bases, some transmitted upper airway sounds ABD: s/nt/nd, no SP discomfort SKIN: no rashes/lesions EXT: 3+ LEE  Imaging: Dg Chest 2 View  Result Date: 06/25/2016 CLINICAL DATA:  Shortness of breath and cough. EXAM: CHEST  2 VIEW COMPARISON:  06/08/2016 FINDINGS: The cardio pericardial silhouette is enlarged. There is pulmonary vascular congestion with possible interstitial pulmonary edema. Bibasilar collapse/ consolidation with bilateral pleural effusions, right greater than left. The visualized bony structures of the thorax are intact. Telemetry leads overlie the chest. IMPRESSION: Cardiomegaly with vascular congestion and bibasilar collapse/ consolidation. Small to moderate bilateral  pleural effusions, mildly progressed in the interval since the prior study. Electronically Signed   By: Misty Stanley M.D.   On: 06/25/2016 14:02   US Renal  Result Date: 06/26/2016 CLINICAL DATA:  Acute kidney injury. EXAM: RENAL / URINARY TRACT ULTRASOUND COMPLETE COMPARISON:  CT 12/17/2011 FINDINGS: Right Kidney: Length: 7.8 cm. No hydronephrosis. Marked thinning of renal parenchyma with increased renal echogenicity. Two renal cysts, with a 2.3 cm cyst a lower kidney, and 1.5 cm cyst in the mid kidney. No evidence of solid mass. Left Kidney: Length: 11.3 cm. No hydronephrosis. There is thinning of renal parenchyma with increased renal echogenicity there are 2 renal cysts, 2.3 cm cyst in the interpolar kidney and 1.9 cm cyst in the upper kidney. No evidence of solid lesion. Bladder: Decompressed by Foley catheter. IMPRESSION: 1. No obstructive uropathy. 2. Thinning of the renal parenchyma with increased renal echogenicity, right greater than left, consistent with chronic medical renal disease. 3. Bilateral renal cysts. Electronically Signed   By: Jeb Levering M.D.   On: 06/26/2016 03:01   Dg Chest Port 1 View  Result Date: 06/25/2016 CLINICAL DATA:  83 y/o  F; central line placement. EXAM: PORTABLE CHEST 1 VIEW COMPARISON:  06/25/2016 chest radiograph. FINDINGS: The bulb of the enlarged cardiac silhouette. Stable small to moderate bilateral effusions with bibasilar opacities. Right central venous catheter tip projects over the mid  SVC. Aortic atherosclerosis with calcifications. No acute osseous abnormality is evident. IMPRESSION: Right central venous catheter tip projects over the mid SVC. Stable small-moderate bilateral pleural effusions and associated bibasilar opacities probably representing atelectasis. Electronically Signed   By: Kristine Garbe M.D.   On: 06/25/2016 23:38    Labs: BMET  Recent Labs Lab 06/25/16 1320 06/25/16 1815 06/26/16 0522  NA 129*  --  136  K 6.5* 6.8*  4.8  CL 93*  --  100*  CO2 23  --  24  GLUCOSE 119*  --  108*  BUN 133*  --  88*  CREATININE 6.98* 6.63* 4.95*  CALCIUM 8.3*  --  7.9*   CBC  Recent Labs Lab 06/25/16 1320 06/25/16 1815  WBC 10.5 9.6  HGB 8.6* 8.6*  HCT 26.6* 25.5*  MCV 92.0 89.8  PLT 270 283    Medications:    . arformoterol  15 mcg Nebulization BID  . aspirin EC  81 mg Oral Daily  . atorvastatin  20 mg Oral q1800  . budesonide (PULMICORT) nebulizer solution  0.5 mg Nebulization BID  . heparin  5,000 Units Subcutaneous Q8H  . insulin aspart  0-9 Units Subcutaneous TID WC  . multivitamin with minerals  1 tablet Oral Daily  . sodium chloride  500 mL Intravenous Once      Madelon Lips MD 06/26/2016, 7:40 AM

## 2016-06-26 NOTE — Progress Notes (Signed)
RN drew two BMPs off patients central line, both the original and repeat came back hemolyzed, requested phlebotomy draw a 3rd repeat.

## 2016-06-26 NOTE — Progress Notes (Signed)
Patient is from [place]. CSW now following for disposition and return to SNF when medically appropriate.    Lorrine Kin, MSW, LCSW [facility] ED/33M Clinical Social Worker [phone]

## 2016-06-26 NOTE — Progress Notes (Addendum)
PULMONARY / CRITICAL CARE MEDICINE   Name: Debbie [name] MRN: [id number] DOB: August 08, 1930    ADMISSION DATE:  06/25/2016 CONSULTATION DATE:  06/25/16  REFERRING MD:  Cathlean Sauer - TRH  CHIEF COMPLAINT:  SOB  HISTORY OF PRESENT ILLNESS:  Debbie [name] is a 83 y.o. female with PMH as outlined below and who resides in SNF.  She presented to [facility] ED 01/17 with dyspnea and worsening renal function.  She had recent admission (06/08/16 through 06/13/16) for AECOPD, influenza A, possible HCAP and was discharged on steroids, abx, as well as diuretics (first furosemide then switched to torsemide).  She had apparently been doing fine up until 01/17 when she had worsening hypoxia along with hypotension (initial BP 77/54).  In ED, she was found to have AoCKD, hyperkalemia (K 6.5), bradycardia, and hypotension.  She was initially admitted by [facility] to SDU but after she had persistent hypotension, PCCM was consulted.  She was transferred to [facility] for further evaluation and consideration for urgent HD.   SUBJECTIVE:  Reports she is feeling better this morning but still feels SOB. She states she has not been eating well the past week, no appetite. Denies any chest pain, abdominal pain.   VITAL SIGNS: BP (!) 113/49   Pulse (!) 44   Temp (!) (P) 94.1 F (34.5 C) (Axillary)   Resp 14   Ht 5\' 5"  (1.651 m)   Wt (P) 190 lb 4.1 oz (86.3 kg)   SpO2 99%   BMI (P) 31.66 kg/m   HEMODYNAMICS:    VENTILATOR SETTINGS:    INTAKE / OUTPUT: No intake/output data recorded.   PHYSICAL EXAMINATION: General: Elderly woman resting in bed, NAD. Neuro: alert and oriented x 3, follows commands, answers questions appropriately HEENT: [place]/AT. PERRL, sclerae anicteric. Cardiovascular: Loletha Grayer in 50s, regular, no m/g/r Lungs: Respirations even and unlabored.  CTA bilaterally. Abdomen: BS+, soft, non-tender, non-distended  Musculoskeletal: No gross deformities, no edema.  Skin: Intact, warm, no  rashes.     LABS:  BMET  Recent Labs Lab 06/25/16 1320 06/25/16 1815 06/26/16 0522  NA 129*  --  136  K 6.5* 6.8* 4.8  CL 93*  --  100*  CO2 23  --  24  BUN 133*  --  88*  CREATININE 6.98* 6.63* 4.95*  GLUCOSE 119*  --  108*    Electrolytes  Recent Labs Lab 06/25/16 1320 06/26/16 0522  CALCIUM 8.3* 7.9*    CBC  Recent Labs Lab 06/25/16 1320 06/25/16 1815  WBC 10.5 9.6  HGB 8.6* 8.6*  HCT 26.6* 25.5*  PLT 270 283    Coag's No results for input(s): APTT, INR in the last 168 hours.  Sepsis Markers No results for input(s): LATICACIDVEN, PROCALCITON, O2SATVEN in the last 168 hours.  ABG No results for input(s): PHART, PCO2ART, PO2ART in the last 168 hours.  Liver Enzymes No results for input(s): AST, ALT, ALKPHOS, BILITOT, ALBUMIN in the last 168 hours.  Cardiac Enzymes  Recent Labs Lab 06/26/16 0130 06/26/16 0522  TROPONINI 0.04* 0.04*    Glucose  Recent Labs Lab 06/26/16 0024 06/26/16 0408  GLUCAP 152* 109*    Imaging Dg Chest 2 View  Result Date: 06/25/2016 CLINICAL DATA:  Shortness of breath and cough. EXAM: CHEST  2 VIEW COMPARISON:  06/08/2016 FINDINGS: The cardio pericardial silhouette is enlarged. There is pulmonary vascular congestion with possible interstitial pulmonary edema. Bibasilar collapse/ consolidation with bilateral pleural effusions, right greater than left. The visualized bony structures of the thorax  are intact. Telemetry leads overlie the chest. IMPRESSION: Cardiomegaly with vascular congestion and bibasilar collapse/ consolidation. Small to moderate bilateral pleural effusions, mildly progressed in the interval since the prior study. Electronically Signed   By: Misty Stanley M.D.   On: 06/25/2016 14:02   US Renal  Result Date: 06/26/2016 CLINICAL DATA:  Acute kidney injury. EXAM: RENAL / URINARY TRACT ULTRASOUND COMPLETE COMPARISON:  CT 12/17/2011 FINDINGS: Right Kidney: Length: 7.8 cm. No hydronephrosis. Marked  thinning of renal parenchyma with increased renal echogenicity. Two renal cysts, with a 2.3 cm cyst a lower kidney, and 1.5 cm cyst in the mid kidney. No evidence of solid mass. Left Kidney: Length: 11.3 cm. No hydronephrosis. There is thinning of renal parenchyma with increased renal echogenicity there are 2 renal cysts, 2.3 cm cyst in the interpolar kidney and 1.9 cm cyst in the upper kidney. No evidence of solid lesion. Bladder: Decompressed by Foley catheter. IMPRESSION: 1. No obstructive uropathy. 2. Thinning of the renal parenchyma with increased renal echogenicity, right greater than left, consistent with chronic medical renal disease. 3. Bilateral renal cysts. Electronically Signed   By: Jeb Levering M.D.   On: 06/26/2016 03:01   Dg Chest Port 1 View  Result Date: 06/25/2016 CLINICAL DATA:  83 y/o  F; central line placement. EXAM: PORTABLE CHEST 1 VIEW COMPARISON:  06/25/2016 chest radiograph. FINDINGS: The bulb of the enlarged cardiac silhouette. Stable small to moderate bilateral effusions with bibasilar opacities. Right central venous catheter tip projects over the mid SVC. Aortic atherosclerosis with calcifications. No acute osseous abnormality is evident. IMPRESSION: Right central venous catheter tip projects over the mid SVC. Stable small-moderate bilateral pleural effusions and associated bibasilar opacities probably representing atelectasis. Electronically Signed   By: Kristine Garbe M.D.   On: 06/25/2016 23:38     STUDIES:  CXR 01/17 > CM, vascular congestion. Renal US 1/18> chronic medical renal disease, bilateral renal cysts. No hydro.  CULTURES: None.  ANTIBIOTICS: None.  SIGNIFICANT EVENTS: 01/17 > admit.  LINES/TUBES: None.  DISCUSSION: 83 y.o. female admitted 01/17 with AoCKD, hyperkalemia, hypotension.  Transferred to [facility] later that evening for PCCM to assume care and consideration urgent HD.  ASSESSMENT / PLAN:  RENAL A:   Hyponatremia - suspect  due to diuretic therapy> resolved Hyperkalemia - exacerbated by ACEi, s/p temporizing measures at [facility] resolved w/ CRRT AoCKD- Cr 7>5, BL Cr 1.5 ? Hypocalcemia - no albumin to correct for. P:   KVO fluids. Nephrology following, CRRT last night Repeat bmet  CARDIOVASCULAR A:  Hypotension and bradycardia - suspect primarily due to hyperkalemia. Hx HTN, PVD, dCHF (echo from Dec 2017 with EF 50-55%, G2DD). Troponin leak- suspect demand ischemia P:  Continue preadmission ASA, atorvastatin. Hold preadmission amlodipine, atenolol, furosemide, hydralazine, lisinopril, torsemide.  PULMONARY A: Acute on chronic hypoxic respiratory failure - reportedly on chronic O2 for COPD (no PFT's in system). COPD per report - no PFT's in system. Effusions pulm edema P:   Continue supplemental O2 as needed to maintain SpO2 > 92%. Budesonide / Brovana in lieu of preadmission dulera. Albuterol PRN. Pulmonary hygiene. CXR intermittently. Neg balance  GASTROINTESTINAL A:   Nutrition. P:   Start diet   HEMATOLOGIC / ONCOLOGIC A:   Anemia of chronic disease. VTE Prophylaxis. Hx endometrial adenocarcinoma (2009). P:  Transfuse for Hgb < 7. SCD's / heparin. CBC in AM.  INFECTIOUS A:   No indication of infection. P:   Monitor clinically. UA neg  ENDOCRINE A:   DM.  P:  SSI. Hold preadmission glimepiride, linagliptin, metformin.  NEUROLOGIC A:   Hx anxiety. P:   No interventions required.  Family updated: None available.  Interdisciplinary Family Meeting v Palliative Care Meeting:  Due by: 07/01/16.  Albin Felling, MD, MPH Internal Medicine Resident, PGY-III Pager: [phone]  STAFF NOTE: Linwood Dibbles, MD FACP have personally reviewed patient's available data, including medical history, events of note, physical examination and test results as part of my evaluation. I have discussed with resident/NP and other care providers such as pharmacist, RN and RRT. In addition, I  personally evaluated patient and elicited key findings of: awake, alert, crackles bases and reduced, no distress, catheter clean, min edema legs, [place] neg hydro, ARF - etiology (ATN, ACEI, lasix), has pulm edema and bilateral effusions, no role thora, continued neg 50-100 on cvvhd, may be able to transition to int HD in am , per renal, brady to  Monitor, currently asymptomatic, if remains brady after 36 hours and total correction K etc and metabolic stressors will re evaluate etiology, no infection present, pcxr in am , if distress will [place] chest, also may have residual BB affect, no role glucagon at this stage The patient is critically ill with multiple organ systems failure and requires high complexity decision making for assessment and support, frequent evaluation and titration of therapies, application of advanced monitoring technologies and extensive interpretation of multiple databases.   Critical Care Time devoted to patient care services described in this note is 30 Minutes. This time reflects time of care of this signee: Merrie Roof, MD FACP. This critical care time does not reflect procedure time, or teaching time or supervisory time of PA/NP/Med student/Med Resident etc but could involve care discussion time. Rest per NP/medical resident whose note is outlined above and that I agree with   Lavon Paganini. Titus Mould, MD, [place] Pgr: [place] [facility] 06/26/2016 8:26 AM

## 2016-06-27 ENCOUNTER — Inpatient Hospital Stay (HOSPITAL_COMMUNITY): Payer: Medicare HMO

## 2016-06-27 DIAGNOSIS — Z789 Other specified health status: Secondary | ICD-10-CM

## 2016-06-27 LAB — RENAL FUNCTION PANEL
ALBUMIN: 2.6 g/dL — AB (ref 3.5–5.0)
ANION GAP: 8 (ref 5–15)
BUN: 25 mg/dL — ABNORMAL HIGH (ref 6–20)
CHLORIDE: 104 mmol/L (ref 101–111)
CO2: 26 mmol/L (ref 22–32)
Calcium: 7.9 mg/dL — ABNORMAL LOW (ref 8.9–10.3)
Creatinine, Ser: 2.11 mg/dL — ABNORMAL HIGH (ref 0.44–1.00)
GFR calc Af Amer: 24 mL/min — ABNORMAL LOW (ref >60)
GFR calc non Af Amer: 21 mL/min — ABNORMAL LOW (ref >60)
GLUCOSE: 98 mg/dL (ref 65–99)
POTASSIUM: 4.3 mmol/L (ref 3.5–5.1)
Phosphorus: 3 mg/dL (ref 2.5–4.6)
Sodium: 138 mmol/L (ref 135–145)

## 2016-06-27 LAB — CBC
HCT: 25.1 % — ABNORMAL LOW (ref 36.0–46.0)
HEMOGLOBIN: 7.8 g/dL — AB (ref 12.0–15.0)
MCH: 29.3 pg (ref 26.0–34.0)
MCHC: 31.1 g/dL (ref 30.0–36.0)
MCV: 94.4 fL (ref 78.0–100.0)
PLATELETS: 150 10*3/uL (ref 150–400)
RBC: 2.66 MIL/uL — AB (ref 3.87–5.11)
RDW: 17.3 % — ABNORMAL HIGH (ref 11.5–15.5)
WBC: 7.5 10*3/uL (ref 4.0–10.5)

## 2016-06-27 LAB — GLUCOSE, CAPILLARY
GLUCOSE-CAPILLARY: 142 mg/dL — AB (ref 65–99)
Glucose-Capillary: 101 mg/dL — ABNORMAL HIGH (ref 65–99)
Glucose-Capillary: 142 mg/dL — ABNORMAL HIGH (ref 65–99)
Glucose-Capillary: 118 mg/dL — ABNORMAL HIGH (ref 65–99)

## 2016-06-27 LAB — MAGNESIUM: Magnesium: 2.3 mg/dL (ref 1.7–2.4)

## 2016-06-27 LAB — POTASSIUM
POTASSIUM: 4.4 mmol/L (ref 3.5–5.1)
Potassium: 4.3 mmol/L (ref 3.5–5.1)

## 2016-06-27 LAB — CALCIUM, IONIZED: CALCIUM, IONIZED, SERUM: 4.1 mg/dL — AB (ref 4.5–5.6)

## 2016-06-27 LAB — PROCALCITONIN: Procalcitonin: 0.2 ng/mL

## 2016-06-27 MED ORDER — SODIUM CHLORIDE 0.9 % IV BOLUS (SEPSIS)
250.0000 mL | Freq: Once | INTRAVENOUS | Status: AC
  Administered 2016-06-27: 250 mL via INTRAVENOUS

## 2016-06-27 NOTE — Progress Notes (Addendum)
PULMONARY / CRITICAL CARE MEDICINE   Name: Debbie [name] MRN: [id number] DOB: Sep 11, 1930    ADMISSION DATE:  06/25/2016 CONSULTATION DATE:  06/25/16  REFERRING MD:  Cathlean Sauer - TRH  CHIEF COMPLAINT:  SOB  HISTORY OF PRESENT ILLNESS:  Debbie [name] is a 83 y.o. female with PMH as outlined below and who resides in SNF.  She presented to [facility] ED 01/17 with dyspnea and worsening renal function.  She had recent admission (06/08/16 through 06/13/16) for AECOPD, influenza A, possible HCAP and was discharged on steroids, abx, as well as diuretics (first furosemide then switched to torsemide).  She had apparently been doing fine up until 01/17 when she had worsening hypoxia along with hypotension (initial BP 77/54).  In ED, she was found to have AoCKD, hyperkalemia (K 6.5), bradycardia, and hypotension.  She was initially admitted by [facility] to SDU but after she had persistent hypotension, PCCM was consulted.  She was transferred to [facility] for further evaluation and consideration for urgent HD.   SUBJECTIVE:  Reports breathing continues to improve. Still bradycardic, but asymptomatic.   VITAL SIGNS: BP (!) 122/47   Pulse (!) 48   Temp 98 F (36.7 C)   Resp 15   Ht 5\' 5"  (1.651 m)   Wt 190 lb 4.1 oz (86.3 kg)   SpO2 95%   BMI 31.66 kg/m   HEMODYNAMICS:    VENTILATOR SETTINGS:    INTAKE / OUTPUT: I/O last 3 completed shifts: In: 34 [P.O.:150; Other:680] Out: [id number] [Urine:112; Other:1305]   PHYSICAL EXAMINATION: General: Elderly woman resting in bed, NAD. Neuro: alert and oriented x 3, follows commands, answers questions appropriately HEENT: [place]/AT. PERRL, sclerae anicteric. Cardiovascular: Loletha Grayer in 50s, regular, no m/g/r Lungs: Respirations even and unlabored.  CTA bilaterally. Abdomen: BS+, soft, non-tender, non-distended  Musculoskeletal: No gross deformities, 2+ lower extremity edema Skin: Intact, warm, no rashes.     LABS:  BMET  Recent Labs Lab 06/25/16 1320  06/25/16 1815 06/26/16 0522  06/26/16 1535 06/26/16 1811 06/27/16 0114  NA 129*  --  136  --  138  --   --   K 6.5* 6.8* 4.8  < > 4.9 4.7 4.3  CL 93*  --  100*  --  103  --   --   CO2 23  --  24  --  25  --   --   BUN 133*  --  88*  --  55*  --   --   CREATININE 6.98* 6.63* 4.95*  --  3.59*  --   --   GLUCOSE 119*  --  108*  --  105*  --   --   < > = values in this interval not displayed.  Electrolytes  Recent Labs Lab 06/25/16 1320 06/26/16 0522 06/26/16 1535  CALCIUM 8.3* 7.9* 8.0*  PHOS  --   --  4.7*    CBC  Recent Labs Lab 06/25/16 1320 06/25/16 1815  WBC 10.5 9.6  HGB 8.6* 8.6*  HCT 26.6* 25.5*  PLT 270 283    Coag's No results for input(s): APTT, INR in the last 168 hours.  Sepsis Markers  Recent Labs Lab 06/26/16 0522  PROCALCITON 0.45    ABG No results for input(s): PHART, PCO2ART, PO2ART in the last 168 hours.  Liver Enzymes  Recent Labs Lab 06/26/16 1535  ALBUMIN 2.7*    Cardiac Enzymes  Recent Labs Lab 06/26/16 0130 06/26/16 0522  TROPONINI 0.04* 0.04*    Glucose  Recent  Labs Lab 06/26/16 0024 06/26/16 0408 06/26/16 0759 06/26/16 1121 06/26/16 1634 06/26/16 2210  GLUCAP 152* 109* 97 128* 116* 98    Imaging No results found.   STUDIES:  CXR 01/17 > CM, vascular congestion. Renal US 1/18> chronic medical renal disease, bilateral renal cysts. No hydro.  CULTURES: None.  ANTIBIOTICS: None.  SIGNIFICANT EVENTS: 01/17 > admit.  LINES/TUBES: None.   DISCUSSION: 83 y.o. female admitted 01/17 with AoCKD, hyperkalemia, hypotension.  Transferred to [facility] later that evening for PCCM to assume care and consideration urgent HD.  ASSESSMENT / PLAN:  RENAL A:   Hyponatremia - suspect due to diuretic therapy> resolved Hyperkalemia - exacerbated by ACEi, s/p temporizing measures at [facility] resolved w/ CRRT AoCKD- Cr 7>5, BL Cr 1.5 ? Hypocalcemia - no albumin to correct for. P:   KVO fluids. Continue CRRT per renal,  possibly can switch to intermittent HD Nephrology following Repeat bmet  CARDIOVASCULAR A:  Hypotension and bradycardia - suspect primarily due to hyperkalemia. Hx HTN, PVD, dCHF (echo from Dec 2017 with EF 50-55%, G2DD). Troponin leak- suspect demand ischemia P:  Continue preadmission ASA, atorvastatin. Hold preadmission amlodipine, atenolol, furosemide, hydralazine, lisinopril, torsemide.  PULMONARY A: Acute on chronic hypoxic respiratory failure - reportedly on chronic O2 for COPD (no PFT's in system). COPD per report - no PFT's in system. Effusions Pulmonary edema P:   CRRT Continue supplemental O2 as needed to maintain SpO2 > 92%. Budesonide / Brovana in lieu of preadmission dulera. Albuterol PRN. Pulmonary hygiene. CXR intermittently. Neg balance  GASTROINTESTINAL A:   Nutrition. P:   Carb mod diet   HEMATOLOGIC / ONCOLOGIC A:   Anemia of chronic disease. VTE Prophylaxis. Hx endometrial adenocarcinoma (2009). P:  Transfuse for Hgb < 7. SCD's / heparin. CBC in AM.  INFECTIOUS A:   No indication of infection. P:   Monitor clinically. UA neg  ENDOCRINE A:   DM.  P:   SSI. Hold preadmission glimepiride, linagliptin, metformin.  NEUROLOGIC A:   Hx anxiety. P:   No interventions required.  Family updated: None available.  Interdisciplinary Family Meeting v Palliative Care Meeting:  Due by: 07/01/16.  Albin Felling, MD, MPH Internal Medicine Resident, PGY-III Pager: [phone]    STAFF NOTE: Linwood Dibbles, MD FACP have personally reviewed patient's available data, including medical history, events of note, physical examination and test results as part of my evaluation. I have discussed with resident/NP and other care providers such as pharmacist, RN and RRT. In addition, I personally evaluated patient and elicited key findings of: awake on cvvhd, no distress, coarse bases, pcxr with unchanged effusions, limited O2 needs, consider dc cvvhsd  and to int HD, follow urine oputput and crt in am , no infection noted, to [facility] floor when off cvvhd, BP remains adaquate, HR is improved after treatment metabolic stressors, if she remains low will need cardiology to assess for sick sinus syndrome etc, keep on tele  Lavon Paganini. Titus Mould, MD, [place] Pgr: [place] [facility] 06/27/2016 10:47 AM

## 2016-06-27 NOTE — Progress Notes (Signed)
Paged Dr. Ardis Hughs, V/O bolus 250 ml once IV.

## 2016-06-27 NOTE — Progress Notes (Signed)
Pt transferred via bed from 2 M.  Pt alert and oriented X 4.  VSS. Denies pain.  Tele # 16 SR/SB.  Pleasant.   Son at bedside.

## 2016-06-27 NOTE — Progress Notes (Signed)
Pt per tele had 2.9 second pause.  Bp 78/44.  Pt alert oriented asymptomatic.

## 2016-06-27 NOTE — Progress Notes (Signed)
[facility] Progress Note    Assessment/ Plan:     1. AoCKD3, likely 2/2 diuretics + ACEi, no obstruction on renal US- CRRT started overnight, tolerating well--> will stop CRRT when filter clots, track UOP, convert to IHD 2. Hyperkalemia 2/2 #1 and ACEi use- resolved 3. Bradycardia, SVR in setting of AFib on BB- bradycardia improved 4. COPD, recent HCAP, pulm congestion and effusions 5. Hypervolemia 6. HTN on atenolol and amlodipine 7. DM2 8. Hypotension at admission- improved  Subjective:    Feeling a little better.  Pressures better.     Objective:   BP (!) 123/50 (BP Location: Right Arm)   Pulse (!) 111   Temp 97.4 F (36.3 C) (Oral)   Resp 19   Ht 5\' 5"  (1.651 m)   Wt 86.3 kg (190 lb 4.1 oz)   SpO2 94%   BMI 31.66 kg/m   Intake/Output Summary (Last 24 hours) at 06/27/16 0936 Last data filed at 06/27/16 0900  Gross per 24 hour  Intake              350 ml  Output             2403 ml  Net            -2053 ml   Weight change: 0.117 kg (4.1 oz)  Physical Exam: GEN: Looks like she's feeling better today ENT: NCAT EYES: EOMI CV: bradycardic, irregular, no rub PULM: diminished throught, bronchial at bases, some transmitted upper airway sounds ABD: s/nt/nd, no SP discomfort SKIN: no rashes/lesions EXT: 2+ LE edema, improved  Imaging: Dg Chest 2 View  Result Date: 06/25/2016 CLINICAL DATA:  Shortness of breath and cough. EXAM: CHEST  2 VIEW COMPARISON:  06/08/2016 FINDINGS: The cardio pericardial silhouette is enlarged. There is pulmonary vascular congestion with possible interstitial pulmonary edema. Bibasilar collapse/ consolidation with bilateral pleural effusions, right greater than left. The visualized bony structures of the thorax are intact. Telemetry leads overlie the chest. IMPRESSION: Cardiomegaly with vascular congestion and bibasilar collapse/ consolidation. Small to moderate bilateral pleural effusions, mildly progressed in the interval  since the prior study. Electronically Signed   By: Misty Stanley M.D.   On: 06/25/2016 14:02   US Renal  Result Date: 06/26/2016 CLINICAL DATA:  Acute kidney injury. EXAM: RENAL / URINARY TRACT ULTRASOUND COMPLETE COMPARISON:  CT 12/17/2011 FINDINGS: Right Kidney: Length: 7.8 cm. No hydronephrosis. Marked thinning of renal parenchyma with increased renal echogenicity. Two renal cysts, with a 2.3 cm cyst a lower kidney, and 1.5 cm cyst in the mid kidney. No evidence of solid mass. Left Kidney: Length: 11.3 cm. No hydronephrosis. There is thinning of renal parenchyma with increased renal echogenicity there are 2 renal cysts, 2.3 cm cyst in the interpolar kidney and 1.9 cm cyst in the upper kidney. No evidence of solid lesion. Bladder: Decompressed by Foley catheter. IMPRESSION: 1. No obstructive uropathy. 2. Thinning of the renal parenchyma with increased renal echogenicity, right greater than left, consistent with chronic medical renal disease. 3. Bilateral renal cysts. Electronically Signed   By: Jeb Levering M.D.   On: 06/26/2016 03:01   Dg Chest Port 1 View  Result Date: 06/27/2016 CLINICAL DATA:  Hypoxia. EXAM: PORTABLE CHEST 1 VIEW COMPARISON:  06/25/2016. FINDINGS: Right IJ line in stable position. Cardiomegaly with pulmonary vascular prominence and bilateral interstitial prominence. Bilateral pleural effusions. Findings consistent with congestive heart failure. Low lung volumes with basilar atelectasis. No pneumothorax . IMPRESSION: 1.  Right IJ line in stable  position. 2. Congestive heart failure with bilateral pulmonary interstitial edema and bilateral pleural effusions. No pneumothorax. Electronically Signed   By: Marcello Moores  Register   On: 06/27/2016 06:44   Dg Chest Port 1 View  Result Date: 06/25/2016 CLINICAL DATA:  83 y/o  F; central line placement. EXAM: PORTABLE CHEST 1 VIEW COMPARISON:  06/25/2016 chest radiograph. FINDINGS: The bulb of the enlarged cardiac silhouette. Stable small to  moderate bilateral effusions with bibasilar opacities. Right central venous catheter tip projects over the mid SVC. Aortic atherosclerosis with calcifications. No acute osseous abnormality is evident. IMPRESSION: Right central venous catheter tip projects over the mid SVC. Stable small-moderate bilateral pleural effusions and associated bibasilar opacities probably representing atelectasis. Electronically Signed   By: Kristine Garbe M.D.   On: 06/25/2016 23:38    Labs: BMET  Recent Labs Lab 06/25/16 1320 06/25/16 1815 06/26/16 0522 06/26/16 1136 06/26/16 1535 06/26/16 1811 06/27/16 0114 06/27/16 0611 06/27/16 0751  NA 129*  --  136  --  138  --   --   --  138  K 6.5* 6.8* 4.8 4.9 4.9 4.7 4.3 4.4 4.3  CL 93*  --  100*  --  103  --   --   --  104  CO2 23  --  24  --  25  --   --   --  26  GLUCOSE 119*  --  108*  --  105*  --   --   --  98  BUN 133*  --  88*  --  55*  --   --   --  25*  CREATININE 6.98* 6.63* 4.95*  --  3.59*  --   --   --  2.11*  CALCIUM 8.3*  --  7.9*  --  8.0*  --   --   --  7.9*  PHOS  --   --   --   --  4.7*  --   --   --  3.0   CBC  Recent Labs Lab 06/25/16 1320 06/25/16 1815 06/27/16 0611  WBC 10.5 9.6 7.5  HGB 8.6* 8.6* 7.8*  HCT 26.6* 25.5* 25.1*  MCV 92.0 89.8 94.4  PLT 270 283 150    Medications:    . arformoterol  15 mcg Nebulization BID  . aspirin EC  81 mg Oral Daily  . atorvastatin  20 mg Oral q1800  . budesonide (PULMICORT) nebulizer solution  0.5 mg Nebulization BID  . heparin  5,000 Units Subcutaneous Q8H  . insulin aspart  0-9 Units Subcutaneous TID WC  . multivitamin with minerals  1 tablet Oral Daily  . sodium chloride  500 mL Intravenous Once      Madelon Lips MD 06/27/2016, 9:36 AM

## 2016-06-27 NOTE — Progress Notes (Signed)
Bolus complete, BP 118/63 hr 48 in right arm.  Left arm 74/75 paged Manaam FYI

## 2016-06-27 NOTE — Progress Notes (Signed)
Paged Dr. Titus Mould

## 2016-06-28 ENCOUNTER — Inpatient Hospital Stay (HOSPITAL_COMMUNITY): Payer: Medicare HMO

## 2016-06-28 LAB — GLUCOSE, CAPILLARY
GLUCOSE-CAPILLARY: 104 mg/dL — AB (ref 65–99)
GLUCOSE-CAPILLARY: 125 mg/dL — AB (ref 65–99)
GLUCOSE-CAPILLARY: 57 mg/dL — AB (ref 65–99)
GLUCOSE-CAPILLARY: 89 mg/dL (ref 65–99)
Glucose-Capillary: 108 mg/dL — ABNORMAL HIGH (ref 65–99)

## 2016-06-28 LAB — RENAL FUNCTION PANEL
ALBUMIN: 2.7 g/dL — AB (ref 3.5–5.0)
Anion gap: 8 (ref 5–15)
BUN: 27 mg/dL — AB (ref 6–20)
CO2: 24 mmol/L (ref 22–32)
Calcium: 8.4 mg/dL — ABNORMAL LOW (ref 8.9–10.3)
Chloride: 104 mmol/L (ref 101–111)
Creatinine, Ser: 2.95 mg/dL — ABNORMAL HIGH (ref 0.44–1.00)
GFR, EST AFRICAN AMERICAN: 16 mL/min — AB (ref >60)
GFR, EST NON AFRICAN AMERICAN: 14 mL/min — AB (ref >60)
Glucose, Bld: 105 mg/dL — ABNORMAL HIGH (ref 65–99)
PHOSPHORUS: 3.9 mg/dL (ref 2.5–4.6)
POTASSIUM: 4.9 mmol/L (ref 3.5–5.1)
Sodium: 136 mmol/L (ref 135–145)

## 2016-06-28 MED ORDER — SODIUM CHLORIDE 0.9% FLUSH
10.0000 mL | INTRAVENOUS | Status: DC | PRN
  Administered 2016-07-01 – 2016-07-10 (×6): 10 mL
  Filled 2016-06-28 (×6): qty 40

## 2016-06-28 MED ORDER — SODIUM CHLORIDE 0.9% FLUSH
10.0000 mL | Freq: Two times a day (BID) | INTRAVENOUS | Status: DC
  Administered 2016-06-29 – 2016-07-17 (×8): 10 mL

## 2016-06-28 MED ORDER — FUROSEMIDE 10 MG/ML IJ SOLN
80.0000 mg | Freq: Two times a day (BID) | INTRAMUSCULAR | Status: DC
  Administered 2016-06-28 – 2016-06-29 (×3): 80 mg via INTRAVENOUS
  Filled 2016-06-28 (×3): qty 8

## 2016-06-28 NOTE — Progress Notes (Signed)
Pt seen and evaluated at bedside. RN reports she had sat in upper 80s on 2-4L [place]. She was changed to a nonrebreather mask. Also noted some encephalopathy.  Pt reports dyspnea. She has orthopnea and PND. She also has a cough that is productive of white sputum.  O2 sat was 100% on nonrebreather.  Awake, alert, answering questions appropriately. Oriented x 2-3 (did not know year but knows month) Lungs with rhonchi bilaterally and scattered wheezes  Administered Duoneb. Following Duoneb lungs still with rhonchi but wheezes absent.  Was able to change back to 4L [place] with O2 sat 95-96%.  CXR with bilateral pleural effusions as well as pulmonary vascular congestion and edema.  Pulmonary Edema: Will alert renal service so she can possibly be seen earlier for dialysis.  Jacques Earthly, MD  Internal Medicine PGY-3 06/28/16 5:22 AM

## 2016-06-28 NOTE — Progress Notes (Signed)
Patient on 5 LPM/[place],oxygen saturation is 92%.

## 2016-06-28 NOTE — Evaluation (Signed)
Physical Therapy Evaluation Patient Details Name: Debbie [name] MRN: [id number] DOB: [birth year] Today's Date: 06/28/2016   History of Present Illness  Pt is an 83 y/o female admitted from a SNF secondary to dyspnea and hyperkalemia. 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  Clinical Impression  Pt presented supine in bed with HOB elevated, awake and willing to participate in therapy session. Prior to admission, pt was at [facility] SNF where she was receiving PT and OT services 5x/week per son's report. Pt's son stated that she was ambulating with use of a RW and needing assistance with ADLs. Pt currently requires mod A with bed mobility. Pt was very limited this session secondary to fatigue and anxiety with sitting EOB. After approximately 5 minutes, pt stated "I can't breath, I can't breath!" and requested to lie back down in bed. Pt was on 5 L of O2 via [place] with SPO2 maintaining at 88-90%. Pt's RN was notified and entered room at end of session. Pt would continue to benefit from skilled physical therapy services at this time while admitted and after d/c to address her below listed limitations in order to improve her overall safety and independence with functional mobility.      Follow Up Recommendations SNF    Equipment Recommendations  None recommended by PT    Recommendations for Other Services       Precautions / Restrictions Precautions Precautions: Fall Precaution Comments: supplemental O2 dependent; monitor SPO2 Restrictions Weight Bearing Restrictions: No      Mobility  Bed Mobility Overal bed mobility: Needs Assistance Bed Mobility: Supine to Sit;Sit to Supine     Supine to sit: Mod assist;HOB elevated Sit to supine: Mod assist   General bed mobility comments: pt required increased time, use of bed rails, VC'ing for  sequencing and mod A at trunk to achieve sitting EOB  Transfers                 General transfer comment: upon sitting at EOB, pt became very anxious and stating that she could not sit up any longer as she was having difficulty breathing. Pt was on 5L of O2 via [place] with SPO2 maintaining at 88-90%.  Ambulation/Gait                Stairs            Wheelchair Mobility    Modified Rankin (Stroke Patients Only)       Balance Overall balance assessment: Needs assistance Sitting-balance support: Feet supported Sitting balance-Leahy Scale: Fair Sitting balance - Comments: pt with fair static balance; however, sitting tolerance was poor. She sat EOB for approximately 5 minutes, at which time she reported that she was having difficulty breathing and needed to lie back down in bed. Pt was on 5L of O2 via [place] with SPO2 maintaining at 88-90%.                                     Pertinent Vitals/Pain Pain Assessment: No/denies pain    Home Living Family/patient expects to be discharged to:: Unsure                 Additional Comments: pt from SNF but pt's son present in room at beginning of session stating that he would be interested in  potentially taking her home after this admission. He stated that he could have a CNA with her 5 days a week for 8-10 hours a day.    Prior Function Level of Independence: Needs assistance   Gait / Transfers Assistance Needed: pt's son reported that she was ambulating with a RW and assist from therapists at [facility]  ADL's / Homemaking Assistance Needed: pt's son reported she needed assistance with bathing and dressing        Hand Dominance        Extremity/Trunk Assessment   Upper Extremity Assessment Upper Extremity Assessment: Generalized weakness    Lower Extremity Assessment Lower Extremity Assessment: Generalized weakness       Communication   Communication: No difficulties  Cognition  Arousal/Alertness: Awake/alert Behavior During Therapy: WFL for tasks assessed/performed Overall Cognitive Status: Within Functional Limits for tasks assessed                      General Comments      Exercises     Assessment/Plan    PT Assessment Patient needs continued PT services  PT Problem List Decreased strength;Decreased activity tolerance;Decreased balance;Decreased mobility;Decreased coordination;Decreased knowledge of use of DME;Decreased safety awareness;Decreased knowledge of precautions;Cardiopulmonary status limiting activity          PT Treatment Interventions DME instruction;Gait training;Functional mobility training;Therapeutic activities;Therapeutic exercise;Balance training;Neuromuscular re-education;Patient/family education    PT Goals (Current goals can be found in the Care Plan section)  Acute Rehab PT Goals Patient Stated Goal: none stated PT Goal Formulation: With patient/family Time For Goal Achievement: 07/12/16 Potential to Achieve Goals: Fair    Frequency Min 3X/week   Barriers to discharge        Co-evaluation               End of Session Equipment Utilized During Treatment: Oxygen Activity Tolerance: Patient limited by fatigue;Other (comment) (pt limited by anxiety and feeling as if she couldn't breathe) Patient left: in bed;with call bell/phone within reach;with bed alarm set;with nursing/sitter in room;Other (comment) (RN present in room) Nurse Communication: Mobility status;Other (comment) (pt with reports of difficulty breathing)         Time: [id number] PT Time Calculation (min) (ACUTE ONLY): 19 min   Charges:   PT Evaluation $PT Eval Moderate Complexity: 1 Procedure     PT G CodesClearnce Sorrel Jayleena Stille 06/28/2016, 4:36 PM Sherie Don, [place], DPT [phone]

## 2016-06-28 NOTE — Progress Notes (Signed)
Pt O2 saturation 86-88% on 4L O2 via nasal canula. Pt using accessory muscles with breathing. Resp. 24. Placed pt on non rebreather. Weak cough also noted. Respiratory therapy and rapid response paged. MD notified. Will continue to monitor.

## 2016-06-28 NOTE — Progress Notes (Signed)
[facility] Progress Note    Assessment/ Plan:     1. AoCKD3, likely 2/2 diuretics + ACEi, no obstruction on renal US- off CRRT, transferred to floor, will use IV Lasix challenge today 2. Hyperkalemia 2/2 #1 and ACEi use- resolved 3. Bradycardia, SVR in setting of AFib on BB- bradycardia improved 4. COPD, recent HCAP, pulm congestion and effusions 5. Hypervolemia 6. HTN on atenolol and amlodipine 7. DM2 8. Hypotension at admission- improved  Subjective:    Off CRRT, transferred to the floor.  Some urine output.     Objective:   BP (!) 150/59 (BP Location: Right Arm)   Pulse 64   Temp 97.7 F (36.5 C) (Oral)   Resp 20   Ht 5\' 5"  (1.651 m)   Wt 86.2 kg (190 lb)   SpO2 97%   BMI 31.62 kg/m   Intake/Output Summary (Last 24 hours) at 06/28/16 1416 Last data filed at 06/28/16 [id number]  Gross per 24 hour  Intake               20 ml  Output              225 ml  Net             -205 ml   Weight change: -0.117 kg (-4.1 oz)  Physical Exam: GEN: Looks like she's feeling better today ENT: NCAT EYES: EOMI CV: bradycardic, irregular, no rub PULM: diminished throught, bronchial at bases, some transmitted upper airway sounds ABD: s/nt/nd, no SP discomfort SKIN: no rashes/lesions EXT: 2+ LE edema, improved  Imaging: Dg Chest Port 1 View  Result Date: 06/28/2016 CLINICAL DATA:  Shortness of breath.  Wheezing. EXAM: PORTABLE CHEST 1 VIEW COMPARISON:  Radiographs yesterday. FINDINGS: Tip of the right internal jugular central venous catheter remains in the SVC. Cardiomegaly is stable. Bilateral pleural effusions with bibasilar atelectasis and volume loss, unchanged from prior exam. Unchanged pulmonary edema. No new abnormality. IMPRESSION: Congestive failure, unchanged in appearance from prior exams. Electronically Signed   By: Jeb Levering M.D.   On: 06/28/2016 05:15   Dg Chest Port 1 View  Result Date: 06/27/2016 CLINICAL DATA:  Hypoxia. EXAM: PORTABLE CHEST 1  VIEW COMPARISON:  06/25/2016. FINDINGS: Right IJ line in stable position. Cardiomegaly with pulmonary vascular prominence and bilateral interstitial prominence. Bilateral pleural effusions. Findings consistent with congestive heart failure. Low lung volumes with basilar atelectasis. No pneumothorax . IMPRESSION: 1.  Right IJ line in stable position. 2. Congestive heart failure with bilateral pulmonary interstitial edema and bilateral pleural effusions. No pneumothorax. Electronically Signed   By: Marcello Moores  Register   On: 06/27/2016 06:44    Labs: [email or web address]  Recent Labs Lab 06/25/16 1320 06/25/16 1815 06/26/16 0522 06/26/16 1136 06/26/16 1535 06/26/16 1811 06/27/16 0114 06/27/16 0611 06/27/16 0751 06/28/16 1218  NA 129*  --  136  --  138  --   --   --  138 136  K 6.5* 6.8* 4.8 4.9 4.9 4.7 4.3 4.4 4.3 4.9  CL 93*  --  100*  --  103  --   --   --  104 104  CO2 23  --  24  --  25  --   --   --  26 24  GLUCOSE 119*  --  108*  --  105*  --   --   --  98 105*  BUN 133*  --  88*  --  55*  --   --   --  25* 27*  CREATININE 6.98* 6.63* 4.95*  --  3.59*  --   --   --  2.11* 2.95*  CALCIUM 8.3*  --  7.9*  --  8.0*  --   --   --  7.9* 8.4*  PHOS  --   --   --   --  4.7*  --   --   --  3.0 3.9   CBC  Recent Labs Lab 06/25/16 1320 06/25/16 1815 06/27/16 0611  WBC 10.5 9.6 7.5  HGB 8.6* 8.6* 7.8*  HCT 26.6* 25.5* 25.1*  MCV 92.0 89.8 94.4  PLT 270 283 150    Medications:    . arformoterol  15 mcg Nebulization BID  . aspirin EC  81 mg Oral Daily  . atorvastatin  20 mg Oral q1800  . budesonide (PULMICORT) nebulizer solution  0.5 mg Nebulization BID  . furosemide  80 mg Intravenous BID  . heparin  5,000 Units Subcutaneous Q8H  . insulin aspart  0-9 Units Subcutaneous TID WC  . multivitamin with minerals  1 tablet Oral Daily  . sodium chloride  500 mL Intravenous Once      Madelon Lips MD 06/28/2016, 2:16 PM

## 2016-06-28 NOTE — Progress Notes (Signed)
CRITICAL VALUE ALERT    CBG is 57 @ 2255 on 06/28/16 Pt resting in bed. No complaints at this time.  Alert and oriented to person, place, and time. Asymptomatic Apple juice 4oz. Given CBG is 89 @ 2321 on 06/28/16

## 2016-06-28 NOTE — Progress Notes (Signed)
PROGRESS NOTE    Debbie [name]  [id number] DOB: [birth year] DOA: 06/25/2016 PCP: Myriam Jacobson, MD    Brief Narrative:  86 ? 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  She had apparently been doing fine up until 01/17 when she had worsening hypoxia along with hypotension (initial BP 77/54).  In ED, she was found to have AoCKD, hyperkalemia (K 6.5), bradycardia, and hypotension.  She was initially admitted by [facility] to SDU but after she had persistent hypotension, PCCM was consulted.   Assessment & Plan:   Active Problems:   Renal failure   AKI (acute kidney injury) ([place])   Hyperkalemia   Atrial fibrillation (HCC)   Bradycardia   Central venous catheter in place   Hypovolemic Hyponatremia Initial hyperkalemia 6.5 Acute kidney injury creatinine 7 on admission baseline 1.5 S/p CRRT on ICU Appreciate nephrology follow-up Patient receiving Lasix now 80 twice a day--U OP today -400 so far however creatinine now 2.9 We'll need to track output and labs and determine if she needs iHD removal of HD cath when no overt need iHD has been confrimed  Diastolic heart failure, grd 2 DD EF 50-55% 05/2017 Previously on torsemide from prior admission Currently all other meds hydralazine 25 3 times a day, lisinopril 2.5 , atenolol 25 twice a day on hold See above discussion  Episodic bradycardia Hyperkalemia versus beta blocker Resolved. pauses noted less than 2 on monitor.  No current ICD indication or need  cardiology eval   Recent H flu +/-HCAP Underlying possible COPD-quit in 1990's desat screen Brovana 15 g twice a day, Pulmicort 0.5 twice a day, albuterol every 3 when necessary  Anemia of chronic disease Endometrial adenocarcinoma status post XRT Normocytic anemia Baseline hemoglobin 9 range, currently 7 range. Monitor transfusion threshold 7   DVT prophylaxis:  Code Status: Full Family Communication: d/w son at the bedside  Disposition Plan:    STUDIES:  CXR 01/17 > CM, vascular congestion. Renal US 1/18> chronic medical renal disease, bilateral renal cysts. No hydro.  CULTURES: None.  ANTIBIOTICS: None.  SIGNIFICANT EVENTS: 01/17 > admit.  LINES/TUBES: None.   Subjective:  Events of shortness of breath overnight noted. Patient much better. Tolerating diet Still has a little bit of shortness but overall feels improved Chest pain No fever Chills No rigors  Objective: Vitals:   06/27/16 1757 06/27/16 2007 06/27/16 2031 06/28/16 0435  BP: (!) 75/49  (!) 134/54 (!) 150/59  Pulse: (!) 54  61 64  Resp: 16  18 20   Temp: 97.6 F (36.4 C)  97.8 F (36.6 C) 97.7 F (36.5 C)  TempSrc: Oral  Oral Oral  SpO2:  93% 93% 99%  Weight:   86.2 kg (190 lb)   Height:   5\' 5"  (1.651 m)     Intake/Output Summary (Last 24 hours) at 06/28/16 0804 Last data filed at 06/28/16 0629  Gross per 24 hour  Intake              320 ml  Output              644 ml  Net             -  324 ml   Filed Weights   06/26/16 0400 06/27/16 0500 06/27/16 2031  Weight: 86.3 kg (190 lb 4.1 oz) 86.3 kg (190 lb 4.1 oz) 86.2 kg (190 lb)    Examination:  General exam: Appears calm and comfortable -R IJ present Respiratory system: Clear to auscultation. Respiratory effort normal. Cardiovascular system: S1 & S2 heard, RRR. No JVD, murmurs. Gastrointestinal system: Abdomen is nondistended, soft and nontender. No organomegaly or masses felt. Normal bowel sounds heard. Central nervous system:  Alert and oriented. No  deficits. Extremities: Symmetric 5 x 5 power. Skin: No rashes, lesions or ulcers Psychiatry: Judgement and insight appear normal. Mood & affect appropriate.     Data Reviewed: I have personally reviewed following labs and imaging studies  CBC:  Recent Labs Lab 06/25/16 1320 06/25/16 1815 06/27/16 0611  WBC 10.5 9.6 7.5  HGB 8.6* 8.6* 7.8*  HCT 26.6* 25.5* 25.1*  MCV 92.0 89.8 94.4  PLT 270 283 [id number]   Basic Metabolic Panel:  Recent Labs Lab 06/25/16 1320 06/25/16 1815 06/26/16 0522  06/26/16 1535 06/26/16 1811 06/27/16 0114 06/27/16 0611 06/27/16 0751  NA 129*  --  136  --  138  --   --   --  138  K 6.5* 6.8* 4.8  < > 4.9 4.7 4.3 4.4 4.3  CL 93*  --  100*  --  103  --   --   --  104  CO2 23  --  24  --  25  --   --   --  26  GLUCOSE 119*  --  108*  --  105*  --   --   --  98  BUN 133*  --  88*  --  55*  --   --   --  25*  CREATININE 6.98* 6.63* 4.95*  --  3.59*  --   --   --  2.11*  CALCIUM 8.3*  --  7.9*  --  8.0*  --   --   --  7.9*  MG  --   --   --   --   --   --   --  2.3  --   PHOS  --   --   --   --  4.7*  --   --   --  3.0  < > = values in this interval not displayed. GFR: Estimated Creatinine Clearance: 21.9 mL/min (by C-G formula based on SCr of 2.11 mg/dL (H)). Liver Function Tests:  Recent Labs Lab 06/26/16 1535 06/27/16 0751  ALBUMIN 2.7* 2.6*   No results for input(s): LIPASE, AMYLASE in the last 168 hours. No results for input(s): AMMONIA in the last 168 hours. Coagulation Profile: No results for input(s): INR, PROTIME in the last 168 hours. Cardiac Enzymes:  Recent Labs Lab 06/26/16 0130 06/26/16 0522  TROPONINI 0.04* 0.04*   BNP (last 3 results) No results for input(s): PROBNP in the last 8760 hours. HbA1C: No results for input(s): HGBA1C in the last 72 hours. CBG:  Recent Labs Lab 06/26/16 2210 06/27/16 0724 06/27/16 1142 06/27/16 1643 06/27/16 2046  GLUCAP 98 101* 142* 142* 118*   Lipid  Profile: No results for input(s): CHOL, HDL, LDLCALC, TRIG, CHOLHDL, LDLDIRECT in the last 72 hours. Thyroid Function Tests: No results for input(s): TSH, T4TOTAL, FREET4, T3FREE, THYROIDAB in the last 72 hours. Anemia Panel: No results for input(s): VITAMINB12, FOLATE, FERRITIN, TIBC, IRON, RETICCTPCT in the last 72 hours. Sepsis Labs:  Recent Labs  Lab 06/26/16 0522 06/27/16 0611  PROCALCITON 0.45 0.20    No results found for this or any previous visit (from the past 240 hour(s)).       Radiology Studies: Dg Chest Port 1 View  Result Date: 06/28/2016 CLINICAL DATA:  Shortness of breath.  Wheezing. EXAM: PORTABLE CHEST 1 VIEW COMPARISON:  Radiographs yesterday. FINDINGS: Tip of the right internal jugular central venous catheter remains in the SVC. Cardiomegaly is stable. Bilateral pleural effusions with bibasilar atelectasis and volume loss, unchanged from prior exam. Unchanged pulmonary edema. No new abnormality. IMPRESSION: Congestive failure, unchanged in appearance from prior exams. Electronically Signed   By: Jeb Levering M.D.   On: 06/28/2016 05:15   Dg Chest Port 1 View  Result Date: 06/27/2016 CLINICAL DATA:  Hypoxia. EXAM: PORTABLE CHEST 1 VIEW COMPARISON:  06/25/2016. FINDINGS: Right IJ line in stable position. Cardiomegaly with pulmonary vascular prominence and bilateral interstitial prominence. Bilateral pleural effusions. Findings consistent with congestive heart failure. Low lung volumes with basilar atelectasis. No pneumothorax . IMPRESSION: 1.  Right IJ line in stable position. 2. Congestive heart failure with bilateral pulmonary interstitial edema and bilateral pleural effusions. No pneumothorax. Electronically Signed   By: [place]   On: 06/27/2016 06:44     Scheduled Meds: . arformoterol  15 mcg Nebulization BID  . aspirin EC  81 mg Oral Daily  . atorvastatin  20 mg Oral q1800  . budesonide (PULMICORT) nebulizer solution  0.5 mg Nebulization BID  .  heparin  5,000 Units Subcutaneous Q8H  . insulin aspart  0-9 Units Subcutaneous TID WC  . multivitamin with minerals  1 tablet Oral Daily  . sodium chloride  500 mL Intravenous Once   Continuous Infusions:   LOS: 3 days    Time spent: 78    Nita Sells, MD [facility] Pager [phone]  If 7PM-7AM, please contact night-coverage [email or web address] Password [email or web address] 06/28/2016, 8:04 AM

## 2016-06-29 LAB — CBC
HCT: 24.7 % — ABNORMAL LOW (ref 36.0–46.0)
Hemoglobin: 7.8 g/dL — ABNORMAL LOW (ref 12.0–15.0)
MCH: 30.1 pg (ref 26.0–34.0)
MCHC: 31.6 g/dL (ref 30.0–36.0)
MCV: 95.4 fL (ref 78.0–100.0)
Platelets: 138 10*3/uL — ABNORMAL LOW (ref 150–400)
RBC: 2.59 MIL/uL — AB (ref 3.87–5.11)
RDW: 17.4 % — AB (ref 11.5–15.5)
WBC: 8.5 10*3/uL (ref 4.0–10.5)

## 2016-06-29 LAB — GLUCOSE, CAPILLARY
GLUCOSE-CAPILLARY: 74 mg/dL (ref 65–99)
GLUCOSE-CAPILLARY: 101 mg/dL — AB (ref 65–99)
Glucose-Capillary: 74 mg/dL (ref 65–99)
Glucose-Capillary: 124 mg/dL — ABNORMAL HIGH (ref 65–99)

## 2016-06-29 LAB — RENAL FUNCTION PANEL
ALBUMIN: 2.4 g/dL — AB (ref 3.5–5.0)
Anion gap: 9 (ref 5–15)
BUN: 32 mg/dL — AB (ref 6–20)
CO2: 26 mmol/L (ref 22–32)
CREATININE: 3.52 mg/dL — AB (ref 0.44–1.00)
Calcium: 8.3 mg/dL — ABNORMAL LOW (ref 8.9–10.3)
Chloride: 102 mmol/L (ref 101–111)
GFR calc Af Amer: 13 mL/min — ABNORMAL LOW (ref >60)
GFR calc non Af Amer: 11 mL/min — ABNORMAL LOW (ref >60)
Glucose, Bld: 71 mg/dL (ref 65–99)
PHOSPHORUS: 4.6 mg/dL (ref 2.5–4.6)
POTASSIUM: 4.8 mmol/L (ref 3.5–5.1)
Sodium: 137 mmol/L (ref 135–145)

## 2016-06-29 MED ORDER — FUROSEMIDE 10 MG/ML IJ SOLN
160.0000 mg | Freq: Two times a day (BID) | INTRAVENOUS | Status: DC
  Administered 2016-06-29 – 2016-07-09 (×20): 160 mg via INTRAVENOUS
  Filled 2016-06-29 (×21): qty 16

## 2016-06-29 MED ORDER — FUROSEMIDE 10 MG/ML IJ SOLN
80.0000 mg | Freq: Once | INTRAMUSCULAR | Status: AC
  Administered 2016-06-29: 80 mg via INTRAVENOUS
  Filled 2016-06-29: qty 8

## 2016-06-29 NOTE — Progress Notes (Signed)
PROGRESS NOTE    Debbie [name]  [id number] DOB: [birth year] DOA: 06/25/2016 PCP: Myriam Jacobson, MD    Brief Narrative:   86 ? 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  She had apparently been doing fine up until 01/17 when she had worsening hypoxia along with hypotension (initial BP 77/54).  In ED, she was found to have AoCKD, hyperkalemia (K 6.5), bradycardia, and hypotension.  She was initially admitted by [facility] to SDU but after she had persistent hypotension, PCCM was consulted.   Assessment & Plan:   Active Problems:   Renal failure   AKI (acute kidney injury) ([place])   Hyperkalemia   Atrial fibrillation (HCC)   Bradycardia   Central venous catheter in place   Hypovolemic Hyponatremia Initial hyperkalemia 6.5 Acute kidney injury creatinine 7 on admission baseline 1.5 S/p CRRT on ICU Appreciate nephrology follow-up Patient receiving Lasix now 80 twice a day--U OP low? -125 cc only  creatinine now 2.9-->3.5  Defer to Nehprology-if  Uop remains low, may require HD Trying t diurese with 160 lasix bid for now-famly aware might still require dialysis  Diastolic heart failure, grd 2 DD EF 50-55% 05/2017 Previously on torsemide from prior admission Currently all other meds hydralazine 25 3 times a day, lisinopril 2.5 , atenolol 25 twice a day on hold See above discussion  Episodic bradycardia Hyperkalemia versus beta blocker Resolved. pauses noted less than 2 on monitor.  No current ICD indication  or need cardiology eval   Recent H flu +/-HCAP Underlying possible COPD-quit in 1990's desat screen Brovana 15 g twice a day, Pulmicort 0.5 twice a day, albuterol every 3 when necessary  Anemia of chronic disease Endometrial adenocarcinoma status post XRT Normocytic anemia Baseline hemoglobin 9 range, currently 7 range. Monitor transfusion threshold 7   DVT prophylaxis:  Code Status: Full Family Communication: d/w son at the bedside  Disposition Plan: unclear currently-wathc Uop andwill be guided by nephrology   STUDIES:  CXR 01/17 > CM, vascular congestion. Renal US 1/18> chronic medical renal disease, bilateral renal cysts. No hydro.  CULTURES: None.  ANTIBIOTICS: None.  SIGNIFICANT EVENTS: 01/17 > admit.  LINES/TUBES: None.   Subjective:  Low cbg noted-eating 25-50% meals Otherwise fair No cp nor sob Seems only a little swollen in LE  Objective: Vitals:   06/29/16 0545 06/29/16 0800 06/29/16 0803 06/29/16 0852  BP: (!) 163/57   (!) 120/55  Pulse: 62   64  Resp: 18   19  Temp: 97.9 F (36.6 C)   97.7 F (36.5 C)  TempSrc: Axillary   Oral  SpO2: 100% 91% 96% 97%  Weight:      Height:        Intake/Output Summary (Last 24 hours) at 06/29/16 1159 Last data filed at 06/29/16 1058  Gross per 24 hour  Intake              480 ml  Output              850 ml  Net             -370 ml  Filed Weights   06/27/16 0500 06/27/16 2031 06/28/16 2245  Weight: 86.3 kg (190 lb 4.1 oz) 86.2 kg (190 lb) 84.4 kg (186 lb)    Examination:  General exam: Appears calm and comfortable -R IJ present Respiratory system: Clear to auscultation. Respiratory effort normal. Cardiovascular system: S1 & S2 heard, RRR. No JVD, murmurs. Gastrointestinal system: Abdomen is nondistended, soft and nontender. No organomegaly or masses felt. Normal bowel sounds heard. Central nervous system: Alert and oriented. No  deficits. Extremities: Symmetric 5 x 5 power. Skin: No rashes,  lesions or ulcers Psychiatry: Judgement and insight appear normal. Mood & affect appropriate.     Data Reviewed: I have personally reviewed following labs and imaging studies  CBC:  Recent Labs Lab 06/25/16 1320 06/25/16 1815 06/27/16 0611 06/29/16 0417  WBC 10.5 9.6 7.5 8.5  HGB 8.6* 8.6* 7.8* 7.8*  HCT 26.6* 25.5* 25.1* 24.7*  MCV 92.0 89.8 94.4 95.4  PLT 270 283 150 0000000*   Basic Metabolic Panel:  Recent Labs Lab 06/26/16 0522  06/26/16 1535  06/27/16 0114 06/27/16 0611 06/27/16 0751 06/28/16 1218 06/29/16 0417  NA 136  --  138  --   --   --  138 136 137  K 4.8  < > 4.9  < > 4.3 4.4 4.3 4.9 4.8  CL 100*  --  103  --   --   --  104 104 102  CO2 24  --  25  --   --   --  26 24 26   GLUCOSE 108*  --  105*  --   --   --  98 105* 71  BUN 88*  --  55*  --   --   --  25* 27* 32*  CREATININE 4.95*  --  3.59*  --   --   --  2.11* 2.95* 3.52*  CALCIUM 7.9*  --  8.0*  --   --   --  7.9* 8.4* 8.3*  MG  --   --   --   --   --  2.3  --   --   --   PHOS  --   --  4.7*  --   --   --  3.0 3.9 4.6  < > = values in this interval not displayed. GFR: Estimated Creatinine Clearance: 13 mL/min (by C-G formula based on SCr of 3.52 mg/dL (H)). Liver Function Tests:  Recent Labs Lab 06/26/16 1535 06/27/16 0751 06/28/16 1218 06/29/16 0417  ALBUMIN 2.7* 2.6* 2.7* 2.4*   No results for input(s): LIPASE, AMYLASE in the last 168 hours. No results for input(s): AMMONIA in the last 168 hours. Coagulation Profile: No results for input(s): INR, PROTIME in the last 168 hours. Cardiac Enzymes:  Recent Labs Lab 06/26/16 0130 06/26/16 0522  TROPONINI 0.04* 0.04*   BNP (last 3 results) No results for input(s): PROBNP in the last 8760 hours. HbA1C: No results for input(s): HGBA1C in the last 72 hours. CBG:  Recent Labs Lab 06/28/16 1650 06/28/16 2255 06/28/16 2321 06/29/16 0747 06/29/16 1133  GLUCAP 125* 57* 89 74 74   Lipid Profile: No results for input(s): CHOL, HDL,  LDLCALC, TRIG, CHOLHDL, LDLDIRECT in the last 72 hours. Thyroid Function Tests: No results for input(s): TSH, T4TOTAL, FREET4, T3FREE, THYROIDAB in the last 72 hours. Anemia Panel: No results for input(s): VITAMINB12, FOLATE, FERRITIN, TIBC, IRON, RETICCTPCT in the last 72 hours. Sepsis Labs:  Recent Labs Lab 06/26/16 0522 06/27/16 0611  PROCALCITON 0.45  0.20    No results found for this or any previous visit (from the past 240 hour(s)).   Radiology Studies: Dg Chest Port 1 View  Result Date: 06/28/2016 CLINICAL DATA:  Shortness of breath.  Wheezing. EXAM: PORTABLE CHEST 1 VIEW COMPARISON:  Radiographs yesterday. FINDINGS: Tip of the right internal jugular central venous catheter remains in the SVC. Cardiomegaly is stable. Bilateral pleural effusions with bibasilar atelectasis and volume loss, unchanged from prior exam. Unchanged pulmonary edema. No new abnormality. IMPRESSION: Congestive failure, unchanged in appearance from prior exams. Electronically Signed   By: Jeb Levering M.D.   On: 06/28/2016 05:15    Scheduled Meds: . arformoterol  15 mcg Nebulization BID  . aspirin EC  81 mg Oral Daily  . atorvastatin  20 mg Oral q1800  . budesonide (PULMICORT) nebulizer solution  0.5 mg Nebulization BID  . furosemide  80 mg Intravenous BID  . heparin  5,000 Units Subcutaneous Q8H  . insulin aspart  0-9 Units Subcutaneous TID WC  . multivitamin with minerals  1 tablet Oral Daily  . sodium chloride  500 mL Intravenous Once  . sodium chloride flush  10-40 mL Intracatheter Q12H   Continuous Infusions:   LOS: 4 days    Time spent: [place], [place], MD [facility] Pager [phone]  If 7PM-7AM, please contact night-coverage [email or web address] Password [email or web address] 06/29/2016, 11:59 AM

## 2016-06-29 NOTE — Progress Notes (Signed)
Debbie [name] Progress Note    Assessment/ Plan:     1. AoCKD3, likely 2/2 diuretics + ACEi, no obstruction on renal US- off CRRT, transferred to floor, increase Lasix to 160 IV BID 2. Hyperkalemia 2/2 #1 and ACEi use- resolved 3. Bradycardia, SVR in setting of AFib on BB- bradycardia improved 4. COPD, recent HCAP, pulm congestion and effusions 5. Hypervolemia- improved with CRRT, lasix as above 6. HTN on atenolol and amlodipine 7. DM2 8. Hypotension at admission- improved  Subjective:    Increased WOB today.  Good UOP.   Objective:   BP (!) 120/55 (BP Location: Right Arm)   Pulse 64   Temp 97.7 F (36.5 C) (Oral)   Resp 19   Ht 5\' 5"  (1.651 m)   Wt 84.4 kg (186 lb)   SpO2 97%   BMI 30.95 kg/m   Intake/Output Summary (Last 24 hours) at 06/29/16 1333 Last data filed at 06/29/16 1058  Gross per 24 hour  Intake              240 ml  Output              850 ml  Net             -610 ml   Weight change: -1.814 kg (-4 lb)  Physical Exam: GEN: mildly increased WOB ENT: NCAT EYES: EOMI CV: bradycardic, irregular, no rub PULM: diminished throught, bronchial at bases, some transmitted upper airway sounds, no frank wheezing ABD: s/nt/nd, no SP discomfort SKIN: no rashes/lesions EXT: 2+ LE edema, improved but still present  Imaging: Dg Chest Port 1 View  Result Date: 06/28/2016 CLINICAL DATA:  Shortness of breath.  Wheezing. EXAM: PORTABLE CHEST 1 VIEW COMPARISON:  Radiographs yesterday. FINDINGS: Tip of the right internal jugular central venous catheter remains in the SVC. Cardiomegaly is stable. Bilateral pleural effusions with bibasilar atelectasis and volume loss, unchanged from prior exam. Unchanged pulmonary edema. No new abnormality. IMPRESSION: Congestive failure, unchanged in appearance from prior exams. Electronically Signed   By: Jeb Levering M.D.   On: 06/28/2016 05:15    Labs: BMET  Recent Labs Lab 06/25/16 1320 06/25/16 1815  06/26/16 0522  06/26/16 1535 06/26/16 1811 06/27/16 0114 06/27/16 0611 06/27/16 0751 06/28/16 1218 06/29/16 0417  NA 129*  --  136  --  138  --   --   --  138 136 137  K 6.5* 6.8* 4.8  < > 4.9 4.7 4.3 4.4 4.3 4.9 4.8  CL 93*  --  100*  --  103  --   --   --  104 104 102  CO2 23  --  24  --  25  --   --   --  26 24 26   GLUCOSE 119*  --  108*  --  105*  --   --   --  98 105* 71  BUN 133*  --  88*  --  55*  --   --   --  25* 27* 32*  CREATININE 6.98* 6.63* 4.95*  --  3.59*  --   --   --  2.11* 2.95* 3.52*  CALCIUM 8.3*  --  7.9*  --  8.0*  --   --   --  7.9* 8.4* 8.3*  PHOS  --   --   --   --  4.7*  --   --   --  3.0 3.9 4.6  < > = values in this interval not  displayed. CBC  Recent Labs Lab 06/25/16 1320 06/25/16 1815 06/27/16 0611 06/29/16 0417  WBC 10.5 9.6 7.5 8.5  HGB 8.6* 8.6* 7.8* 7.8*  HCT 26.6* 25.5* 25.1* 24.7*  MCV 92.0 89.8 94.4 95.4  PLT 270 283 150 138*    Medications:    . arformoterol  15 mcg Nebulization BID  . aspirin EC  81 mg Oral Daily  . atorvastatin  20 mg Oral q1800  . budesonide (PULMICORT) nebulizer solution  0.5 mg Nebulization BID  . furosemide  160 mg Intravenous BID  . heparin  5,000 Units Subcutaneous Q8H  . insulin aspart  0-9 Units Subcutaneous TID WC  . multivitamin with minerals  1 tablet Oral Daily  . sodium chloride  500 mL Intravenous Once  . sodium chloride flush  10-40 mL Intracatheter Q12H      Madelon Lips MD 06/29/2016, 1:33 PM

## 2016-06-30 LAB — IRON AND TIBC
Iron: 24 ug/dL — ABNORMAL LOW (ref 28–170)
Saturation Ratios: 10 % — ABNORMAL LOW (ref 10.4–31.8)
TIBC: 248 ug/dL — ABNORMAL LOW (ref 250–450)
UIBC: 224 ug/dL

## 2016-06-30 LAB — CBC
HCT: 25.1 % — ABNORMAL LOW (ref 36.0–46.0)
Hemoglobin: 7.8 g/dL — ABNORMAL LOW (ref 12.0–15.0)
MCH: 29.9 pg (ref 26.0–34.0)
MCHC: 31.1 g/dL (ref 30.0–36.0)
MCV: 96.2 fL (ref 78.0–100.0)
Platelets: 158 10*3/uL (ref 150–400)
RBC: 2.61 MIL/uL — ABNORMAL LOW (ref 3.87–5.11)
RDW: 17.4 % — AB (ref 11.5–15.5)
WBC: 8.3 10*3/uL (ref 4.0–10.5)

## 2016-06-30 LAB — FOLATE: Folate: 39.2 ng/mL (ref >5.9)

## 2016-06-30 LAB — RENAL FUNCTION PANEL
ANION GAP: 7 (ref 5–15)
Albumin: 2.2 g/dL — ABNORMAL LOW (ref 3.5–5.0)
BUN: 37 mg/dL — AB (ref 6–20)
CHLORIDE: 104 mmol/L (ref 101–111)
CO2: 28 mmol/L (ref 22–32)
Calcium: 8.2 mg/dL — ABNORMAL LOW (ref 8.9–10.3)
Creatinine, Ser: 4.04 mg/dL — ABNORMAL HIGH (ref 0.44–1.00)
GFR, EST AFRICAN AMERICAN: 11 mL/min — AB (ref >60)
GFR, EST NON AFRICAN AMERICAN: 9 mL/min — AB (ref >60)
Glucose, Bld: 83 mg/dL (ref 65–99)
PHOSPHORUS: 5.6 mg/dL — AB (ref 2.5–4.6)
POTASSIUM: 4.9 mmol/L (ref 3.5–5.1)
Sodium: 139 mmol/L (ref 135–145)

## 2016-06-30 LAB — RETICULOCYTES
RBC.: 2.61 MIL/uL — AB (ref 3.87–5.11)
RETIC COUNT ABSOLUTE: 83.5 10*3/uL (ref 19.0–186.0)
Retic Ct Pct: 3.2 % — ABNORMAL HIGH (ref 0.4–3.1)

## 2016-06-30 LAB — GLUCOSE, CAPILLARY
GLUCOSE-CAPILLARY: 113 mg/dL — AB (ref 65–99)
GLUCOSE-CAPILLARY: 97 mg/dL (ref 65–99)
GLUCOSE-CAPILLARY: 108 mg/dL — AB (ref 65–99)
Glucose-Capillary: 90 mg/dL (ref 65–99)

## 2016-06-30 LAB — FERRITIN: FERRITIN: 53 ng/mL (ref 11–307)

## 2016-06-30 LAB — VITAMIN B12: Vitamin B-12: 518 pg/mL (ref 180–914)

## 2016-06-30 MED ORDER — HEPARIN SODIUM (PORCINE) 1000 UNIT/ML DIALYSIS: 1000.0000 [IU] | INTRAMUSCULAR | Status: DC | PRN

## 2016-06-30 MED ORDER — ALTEPLASE 2 MG IJ SOLR: 2.0000 mg | Freq: Once | INTRAMUSCULAR | Status: DC | PRN

## 2016-06-30 MED ORDER — SODIUM CHLORIDE 0.9 % IV SOLN: 100.0000 mL | INTRAVENOUS | Status: DC | PRN

## 2016-06-30 MED ORDER — LIDOCAINE HCL (PF) 1 % IJ SOLN: 5.0000 mL | INTRAMUSCULAR | Status: DC | PRN

## 2016-06-30 MED ORDER — LIDOCAINE-PRILOCAINE 2.5-2.5 % EX CREA: 1.0000 "application " | TOPICAL_CREAM | CUTANEOUS | Status: DC | PRN

## 2016-06-30 MED ORDER — PENTAFLUOROPROP-TETRAFLUOROETH EX AERO: 1.0000 "application " | INHALATION_SPRAY | CUTANEOUS | Status: DC | PRN

## 2016-06-30 NOTE — Progress Notes (Signed)
[facility] Progress Note    Assessment/ Plan:     1. AoCKD3, likely 2/2 diuretics + ACEi, no obstruction on renal US.  She had an AKI during her last hospitalization and was discharged with a Cr of 2.1; HCTZ/ lisinopril was held on discharge and was resumed as an outpatient.  Upon this admission, pt Initially required CRRT for vol overload/ hypotension, both improved and this was d/c'd, she was transferred to floor on 1/19.  Trial of high-dose Lasix produced some urine output but creatinine still rising; still having optimism for some renal recovery.  Will do HD today for volume removal.    2. Hyperkalemia 2/2 #1 and ACEi use- resolved  3. Bradycardia, SVR in setting of AFib on BB- bradycardia improved, off BB now  4. Volume overload: expect to improve with HD today, continue Lasix 160 IV BID  5. Acute hypoxic respiratory failure: increased O2 requirement today, feel this is 2/2 #4, likely decreased pulm reserve in setting of known COPD and recent HCAP  6. Recent HCAP and +flu, pulm congestion and effusions (discharged 06/13/16 from [facility]).  Finished a course of Tamiflu and Levaquin.  Does not appear to have been discharged on home O2.  7. COPD: on Brovana and pulmicort    8. HTN- holding antihypertensives for now; recommend adding back amlodipine first if needed but don't think she requires any right now  9. Dm2- per primary   Subjective:    Pt remains volume overloaded despite increased dose of Lasix.  On higher O2 requirement.  Will need HD today.   Objective:   BP (!) 118/49 (BP Location: Right Arm)   Pulse 67   Temp 98.5 F (36.9 C) (Oral)   Resp 18   Ht 5\' 5"  (1.651 m)   Wt 84.4 kg (186 lb)   SpO2 94%   BMI 30.95 kg/m   Intake/Output Summary (Last 24 hours) at 06/30/16 1000 Last data filed at 06/30/16 0949  Gross per 24 hour  Intake              446 ml  Output             1125 ml  Net             -679 ml   Weight change:   Physical Exam: GEN:  increased WOB HEENT: EOMI, PERRL, some mild periorbital edema NECK: + JVD CV: bradycardic, irregular, no rub PULM: mild bilateral inspiratory rhonchi ABD: s/nt/nd, no SP discomfort SKIN: no rashes/lesions EXT: 2+ LE edema   Imaging: No results found.  Labs: BMET  Recent Labs Lab 06/25/16 1320 06/25/16 1815 06/26/16 0522  06/26/16 1535 06/26/16 1811 06/27/16 0114 06/27/16 [id number] 06/27/16 0751 06/28/16 1218 06/29/16 0417 06/30/16 0408  NA 129*  --  136  --  138  --   --   --  138 136 137 139  K 6.5* 6.8* 4.8  < > 4.9 4.7 4.3 4.4 4.3 4.9 4.8 4.9  CL 93*  --  100*  --  103  --   --   --  104 104 102 104  CO2 23  --  24  --  25  --   --   --  26 24 26 28   GLUCOSE 119*  --  108*  --  105*  --   --   --  98 105* 71 83  BUN 133*  --  88*  --  55*  --   --   --  25* 27* 32* 37*  CREATININE 6.98* 6.63* 4.95*  --  3.59*  --   --   --  2.11* 2.95* 3.52* 4.04*  CALCIUM 8.3*  --  7.9*  --  8.0*  --   --   --  7.9* 8.4* 8.3* 8.2*  PHOS  --   --   --   --  4.7*  --   --   --  3.0 3.9 4.6 5.6*  < > = values in this interval not displayed. CBC  Recent Labs Lab 06/25/16 1320 06/25/16 1815 06/27/16 0611 06/29/16 0417  WBC 10.5 9.6 7.5 8.5  HGB 8.6* 8.6* 7.8* 7.8*  HCT 26.6* 25.5* 25.1* 24.7*  MCV 92.0 89.8 94.4 95.4  PLT 270 283 150 138*    Medications:    . arformoterol  15 mcg Nebulization BID  . aspirin EC  81 mg Oral Daily  . atorvastatin  20 mg Oral q1800  . budesonide (PULMICORT) nebulizer solution  0.5 mg Nebulization BID  . furosemide  160 mg Intravenous BID  . heparin  5,000 Units Subcutaneous Q8H  . insulin aspart  0-9 Units Subcutaneous TID WC  . multivitamin with minerals  1 tablet Oral Daily  . sodium chloride  500 mL Intravenous Once  . sodium chloride flush  10-40 mL Intracatheter Q12H      Madelon Lips MD 06/30/2016, 10:00 AM

## 2016-06-30 NOTE — Procedures (Signed)
Patient seen and examined on Hemodialysis. QB 325 UF goal 1.5-2 liters (has been set for 2 liters now).  Dialyzing for volume today. Treatment adjusted as needed.  Madelon Lips MD [facility] 2:04 PM

## 2016-06-30 NOTE — Progress Notes (Signed)
PROGRESS NOTE    Debbie [name]  [id number] DOB: 1930-08-02 DOA: 06/25/2016 PCP: Myriam Jacobson, MD    Brief Narrative:   64 ? 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  She had apparently been doing fine up until 01/17 when she had worsening hypoxia along with hypotension (initial BP 77/54).  In ED, she was found to have AoCKD, hyperkalemia (K 6.5), bradycardia, and hypotension.  She was initially admitted by [facility] to SDU but after she had persistent hypotension, PCCM was consulted.   Assessment & Plan:   Active Problems:   Renal failure   AKI (acute kidney injury) ([place])   Hyperkalemia   Atrial fibrillation (HCC)   Bradycardia   Central venous catheter in place   Hypovolemic Hyponatremia Initial hyperkalemia 6.5 Acute kidney injury creatinine 7 on admission baseline 1.5 S/p CRRT on ICU Appreciate nephrology follow-up Patient receiving Lasix now 80 twice a day--U OP low? -739 cc only  creatinine now 2.9-->3.5-->4.04  Defer to Nehprology-if UoP remains low, may require HD Per Nephro diurese with 160 lasix bid for now-famly aware might still require dialysis  Acute hypoxic resp failure Low o2 sats 122 Feels a little more sob. Will probably need either more lasix or dialysis-suspect might need dialysis Defer to renal  Diastolic heart failure, grd 2 DD EF 50-55% 05/2017 Previously on torsemide from prior admission Currently all other meds hydralazine 25 3 times a day, lisinopril 2.5 , atenolol 25  twice a day on hold See above discussion  Episodic bradycardia Hyperkalemia versus beta blocker Resolved. pauses noted less than 2 on monitor.  No current ICD indication or need cardiology eval   Recent H flu +/-HCAP Underlying possible COPD-quit in 1990's desat screen Brovana 15 g twice a day, Pulmicort 0.5 twice a day, albuterol every 3 when necessary  Anemia of chronic disease Endometrial adenocarcinoma status post XRT Normocytic anemia Baseline hemoglobin 9 range, currently 7 range. Monitor transfusion threshold 7 Get anemia panel-replace if prn with Aransep/IV iron   DVT prophylaxis:  Code Status: Full Family Communication: d/w son at the bedside  Disposition Plan: unclear currently-watch Uop andwill be guided by nephrology   STUDIES:  CXR 01/17 > CM, vascular congestion. Renal US 1/18> chronic medical renal disease, bilateral renal cysts. No hydro.  CULTURES: None.  ANTIBIOTICS: None.  SIGNIFICANT EVENTS: 01/17 > admit.  LINES/TUBES: None.   Subjective:  fair No f chills but sob No cp No rash Slight confusion  Objective: Vitals:   06/29/16 1646 06/29/16 1946 06/29/16 2124 06/30/16 0501  BP: (!) 96/58  (!) 136/50 (!) 148/53  Pulse: 60  (!) 53 (!) 57  Resp: 18  18 18   Temp: 97.9 F (36.6 C)  98.2 F (36.8 C) 97.5 F (36.4 C)  TempSrc: Oral  Oral Oral  SpO2: 95% 96% 97% 96%  Weight:      Height:        Intake/Output Summary (Last 24 hours) at 06/30/16 [id number] Last data filed at 06/30/16 0500  Gross per 24 hour  Intake              386 ml  Output             1125 ml  Net             -739 ml   Filed Weights   06/27/16 0500 06/27/16 2031 06/28/16 2245  Weight: 86.3 kg (190 lb 4.1 oz) 86.2 kg (190 lb) 84.4 kg (186 lb)    Examination:  General exam: Appears calm and comfortable -R IJ present Respiratory system: Clear to auscultation. Respiratory effort slight increase Cardiovascular system: S1 & S2 heard, RRR. No JVD,  murmurs. Gastrointestinal system: Abdomen is nondistended, soft and nontender. \ Central nervous system: Alert and oriented. No  deficits. Extremities: Symmetric 5 x 5 power. Skin: No rashes, lesions or ulcers Psychiatry: Judgement and insight appear normal. Mood & affect appropriate.     Data Reviewed: I have personally reviewed following labs and imaging studies  CBC:  Recent Labs Lab 06/25/16 1320 06/25/16 1815 06/27/16 0611 06/29/16 0417  WBC 10.5 9.6 7.5 8.5  HGB 8.6* 8.6* 7.8* 7.8*  HCT 26.6* 25.5* 25.1* 24.7*  MCV 92.0 89.8 94.4 95.4  PLT 270 283 150 0000000*   Basic Metabolic Panel:  Recent Labs Lab 06/26/16 1535  06/27/16 0611 06/27/16 0751 06/28/16 1218 06/29/16 0417 06/30/16 0408  NA 138  --   --  138 136 137 139  K 4.9  < > 4.4 4.3 4.9 4.8 4.9  CL 103  --   --  104 104 102 104  CO2 25  --   --  26 24 26 28   GLUCOSE 105*  --   --  98 105* 71 83  BUN 55*  --   --  25* 27* 32* 37*  CREATININE 3.59*  --   --  2.11* 2.95* 3.52* 4.04*  CALCIUM 8.0*  --   --  7.9* 8.4* 8.3* 8.2*  MG  --   --  2.3  --   --   --   --   PHOS 4.7*  --   --  3.0 3.9 4.6 5.6*  < > = values in this interval not displayed. GFR: Estimated Creatinine Clearance: 11.3 mL/min (by C-G formula based on SCr of 4.04 mg/dL (H)). Liver Function Tests:  Recent Labs Lab 06/26/16 1535 06/27/16 0751 06/28/16 1218 06/29/16 0417 06/30/16 0408  ALBUMIN 2.7* 2.6* 2.7* 2.4* 2.2*   No results for input(s): LIPASE, AMYLASE in the last 168 hours. No results for input(s): AMMONIA in the last 168 hours. Coagulation Profile: No results for input(s): INR, PROTIME in the last 168 hours. Cardiac Enzymes:  Recent Labs Lab 06/26/16 0130 06/26/16 0522  TROPONINI 0.04* 0.04*   BNP (last 3 results) No results for input(s): PROBNP in the last 8760 hours. HbA1C: No results for input(s): HGBA1C in the last 72 hours. CBG:  Recent Labs Lab 06/29/16 0747 06/29/16 1133 06/29/16 1618 06/29/16 2122  06/30/16 0739  GLUCAP 74 74 101* 124* 90   Lipid Profile: No results for input(s): CHOL, HDL, LDLCALC, TRIG, CHOLHDL, LDLDIRECT in the last 72 hours. Thyroid Function Tests: No results for input(s): TSH, T4TOTAL, FREET4, T3FREE, THYROIDAB in the last 72 hours. Anemia Panel: No results for input(s): VITAMINB12, FOLATE, FERRITIN, TIBC, IRON, RETICCTPCT in the last 72 hours. Sepsis Labs:  Recent Labs Lab 06/26/16 0522 06/27/16 0611  PROCALCITON 0.45 0.20    No results found for this or any previous visit (from the past 240 hour(s)).   Radiology Studies: No results found.  Scheduled Meds: . arformoterol  15 mcg Nebulization  BID  . aspirin EC  81 mg Oral Daily  . atorvastatin  20 mg Oral q1800  . budesonide (PULMICORT) nebulizer solution  0.5 mg Nebulization BID  . furosemide  160 mg Intravenous BID  . heparin  5,000 Units Subcutaneous Q8H  . insulin aspart  0-9 Units Subcutaneous TID WC  . multivitamin with minerals  1 tablet Oral Daily  . sodium chloride  500 mL Intravenous Once  . sodium chloride flush  10-40 mL Intracatheter Q12H   Continuous Infusions:   LOS: 5 days    Time spent: [place], [place], MD [facility] Pager [phone]  If 7PM-7AM, please contact night-coverage [email or web address] Password [facility] 06/30/2016, 8:28 AM

## 2016-06-30 NOTE — Care Management Important Message (Signed)
Important Message  Patient Details  Name: Debbie [name] MRN: [id number] Date of Birth: [birth year]   Medicare Important Message Given:  Yes    [name] 06/30/2016, 11:13 AM

## 2016-07-01 LAB — GLUCOSE, CAPILLARY
GLUCOSE-CAPILLARY: 98 mg/dL (ref 65–99)
GLUCOSE-CAPILLARY: 151 mg/dL — AB (ref 65–99)
GLUCOSE-CAPILLARY: 123 mg/dL — AB (ref 65–99)
Glucose-Capillary: 115 mg/dL — ABNORMAL HIGH (ref 65–99)

## 2016-07-01 LAB — RENAL FUNCTION PANEL
Albumin: 2.3 g/dL — ABNORMAL LOW (ref 3.5–5.0)
Anion gap: 8 (ref 5–15)
BUN: 21 mg/dL — AB (ref 6–20)
CHLORIDE: 102 mmol/L (ref 101–111)
CO2: 29 mmol/L (ref 22–32)
Calcium: 8.2 mg/dL — ABNORMAL LOW (ref 8.9–10.3)
Creatinine, Ser: 2.92 mg/dL — ABNORMAL HIGH (ref 0.44–1.00)
GFR calc Af Amer: 16 mL/min — ABNORMAL LOW (ref >60)
GFR calc non Af Amer: 14 mL/min — ABNORMAL LOW (ref >60)
Glucose, Bld: 97 mg/dL (ref 65–99)
POTASSIUM: 4.4 mmol/L (ref 3.5–5.1)
Phosphorus: 4.2 mg/dL (ref 2.5–4.6)
Sodium: 139 mmol/L (ref 135–145)

## 2016-07-01 LAB — HEPATITIS B SURFACE ANTIGEN: HEP B S AG: NEGATIVE

## 2016-07-01 LAB — HEPATITIS B E ANTIGEN: HEP B E AG: NEGATIVE

## 2016-07-01 LAB — HEPATITIS B CORE ANTIBODY, TOTAL: Hep B Core Total Ab: NEGATIVE

## 2016-07-01 MED ORDER — IRON DEXTRAN 50 MG/ML IJ SOLN
50.0000 mg | Freq: Once | INTRAMUSCULAR | Status: AC
  Administered 2016-07-01: 50 mg via INTRAVENOUS
  Filled 2016-07-01 (×2): qty 1

## 2016-07-01 MED ORDER — SODIUM CHLORIDE 0.9 % IV SOLN
25.0000 mg | Freq: Once | INTRAVENOUS | Status: AC
  Administered 2016-07-01: 25 mg via INTRAVENOUS
  Filled 2016-07-01: qty 0.5

## 2016-07-01 NOTE — Progress Notes (Addendum)
Physical Therapy Treatment Patient Details Name: BRETTE PICCINI MRN: [id number] DOB: 1930/12/08 Today's Date: 07/01/2016    History of Present Illness Pt is an 83 y/o female admitted from a SNF secondary to dyspnea and hyperkalemia. 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    PT Comments    Pt continues to have a wet cough that doesn't seem to produce much.  Sats at rest on 4L run in the 92-04% range.  Sats with activity on 4L [place] drop below 90% into upper 80's.  Slowly improving, but glad son has gotten paid caregivers to come in and assist her when home.   Follow Up Recommendations  Home health PT     Equipment Recommendations  3in1 (PT)    Recommendations for Other Services       Precautions / Restrictions Precautions Precautions: Fall Precaution Comments: supplemental O2 dependent; monitor SPO2    Mobility  Bed Mobility Overal bed mobility: Needs Assistance Bed Mobility: Supine to Sit     Supine to sit: Min assist;HOB elevated     General bed mobility comments: increased time, use of rails and minimal assist  Transfers Overall transfer level: Needs assistance Equipment used: Rolling walker (2 wheeled) Transfers: Sit to/from Stand Sit to Stand: Min assist         General transfer comment: cues for hand placement,  needed assist to come up  Ambulation/Gait Ambulation/Gait assistance: Min assist Ambulation Distance (Feet): 30 Feet Assistive device: Rolling walker (2 wheeled) Gait Pattern/deviations: Step-through pattern;Decreased stride length;Trunk flexed Gait velocity: slower Gait velocity interpretation: Below normal speed for age/gender General Gait Details: SpO2 down into the upper 80's on 4L at end of ambulation   Stairs            Wheelchair Mobility    Modified Rankin (Stroke Patients Only)        Balance Overall balance assessment: Needs assistance Sitting-balance support: Feet supported Sitting balance-Leahy Scale: Fair     Standing balance support: During functional activity;Bilateral upper extremity supported Standing balance-Leahy Scale: Poor Standing balance comment: reliant on the RW                    Cognition Arousal/Alertness: Awake/alert Behavior During Therapy: WFL for tasks assessed/performed Overall Cognitive Status: Within Functional Limits for tasks assessed                      Exercises General Exercises - Lower Extremity Straight Leg Raises: AROM;Strengthening;Both;10 reps;Supine Hip Flexion/Marching: AROM;Strengthening;Both;10 reps;Supine (graded resistance)    General Comments        Pertinent Vitals/Pain Pain Assessment: Faces Faces Pain Scale: No hurt    Home Living                      Prior Function            PT Goals (current goals can now be found in the care plan section) Acute Rehab PT Goals Patient Stated Goal: none stated PT Goal Formulation: With patient/family Time For Goal Achievement: 07/12/16 Potential to Achieve Goals: Fair Progress towards PT goals: Progressing toward goals    Frequency    Min 3X/week      PT Plan Discharge plan needs to be updated    Co-evaluation             End of Session Equipment  Utilized During Treatment: Oxygen Activity Tolerance: Patient limited by fatigue;Other (comment) Patient left: with call bell/phone within reach;in chair;with family/visitor present     Time: [id number] PT Time Calculation (min) (ACUTE ONLY): 38 min  Charges:  $Gait Training: 8-22 mins $Therapeutic Activity: 23-37 mins                    G CodesTessie Fass Dewane Timson 07/01/2016, 12:58 PM 07/01/2016  Donnella Sham, [place] [phone]  (pager)

## 2016-07-01 NOTE — Progress Notes (Signed)
[facility] Progress Note    Subjective:    Feels better since HD yesterday Making lots of urine today O2 nasal cannula 4-5L good sats   Objective:   BP (!) 118/45 (BP Location: Right Arm)   Pulse 62   Temp 98.1 F (36.7 C) (Oral)   Resp 16   Ht 5\' 5"  (1.651 m)   Wt 84 kg (185 lb 3 oz)   SpO2 100%   BMI 30.82 kg/m   Intake/Output Summary (Last 24 hours) at 07/01/16 1738 Last data filed at 07/01/16 1612  Gross per 24 hour  Intake              300 ml  Output              900 ml  Net             -600 ml   Weight change:   Physical Exam: Very nice elderly woman, NAD No increased WOB 6 cm JVP HR 60's, irregular S1S2 No S3 Abd soft and non-tender 1+ edema LE's R IJ Trialysis cath in place   Recent Labs Lab 06/26/16 0522  06/26/16 1535  06/27/16 0114 06/27/16 [id number] 06/27/16 0751 06/28/16 1218 06/29/16 0417 06/30/16 0408 07/01/16 0437  NA 136  --  138  --   --   --  138 136 137 139 139  K 4.8  < > 4.9  < > 4.3 4.4 4.3 4.9 4.8 4.9 4.4  CL 100*  --  103  --   --   --  104 104 102 104 102  CO2 24  --  25  --   --   --  26 24 26 28 29   GLUCOSE 108*  --  105*  --   --   --  98 105* 71 83 97  BUN 88*  --  55*  --   --   --  25* 27* 32* 37* 21*  CREATININE 4.95*  --  3.59*  --   --   --  2.11* 2.95* 3.52* 4.04* 2.92*  CALCIUM 7.9*  --  8.0*  --   --   --  7.9* 8.4* 8.3* 8.2* 8.2*  PHOS  --   --  4.7*  --   --   --  3.0 3.9 4.6 5.6* 4.2  < > = values in this interval not displayed.   Recent Labs Lab 06/25/16 1815 06/27/16 0611 06/29/16 0417 06/30/16 1128  WBC 9.6 7.5 8.5 8.3  HGB 8.6* 7.8* 7.8* 7.8*  HCT 25.5* 25.1* 24.7* 25.1*  MCV 89.8 94.4 95.4 96.2  PLT 283 150 138* 158    Medications:    . arformoterol  15 mcg Nebulization BID  . aspirin EC  81 mg Oral Daily  . atorvastatin  20 mg Oral q1800  . budesonide (PULMICORT) nebulizer solution  0.5 mg Nebulization BID  . furosemide  160 mg Intravenous BID  . heparin  5,000 Units  Subcutaneous Q8H  . insulin aspart  0-9 Units Subcutaneous TID WC  . iron dextran (INFED/DEXFERRUM) infusion  50 mg Intravenous Once  . multivitamin with minerals  1 tablet Oral Daily  . sodium chloride  500 mL Intravenous Once  . sodium chloride flush  10-40 mL Intracatheter Q12H    Assessment/ Plan:    1. AoCKD3, likely 2/2 diuretics + ACEi, no obstruction on renal US.  She had an AKI during her last hospitalization and was discharged with a Cr of  2.1; HCTZ/ lisinopril was held on discharge and was resumed as an outpatient.  Upon this admission, pt Initially required CRRT for vol overload/ hypotension, both improved and this was d/c'd, she was transferred to floor on 1/19.  Trial of high-dose Lasix produced some urine output but creatinine was still rising - had HD 1/22 for volume removal. Has had 1 liter UOP so far today (total of 1 liter yesterday). Still on BID lasix IV 160. Creatinine down to 2.9 but that just reflects HD yesterday. Tomorrow lab will be more indicative of status of GFR return 2. Hyperkalemia 2/2 #1 and ACEi use- resolved. NO MORE ACE FOR HE 3. Bradycardia, SVR in setting of AFib on BB- bradycardia improved, off BB no 4. Volume overload improved with HD 1/22 with 1.5 L off plus additional UOP. Continue Lasix 160 IV BID 5. Acute hypoxic respiratory failure: Stable O2 requirement today. Decreased pulm reserve in setting of known COPD and recent HCA 6. Recent HCAP and +flu, pulm congestion and effusions (discharged 06/13/16 from [facility]).  Finished a course of Tamiflu and Levaquin.  Does not appear to have been discharged on home O2 7. COPD: on Brovana and pulmicort 8. HTN- holding antihypertensives for now; recommend adding back amlodipine first if needed but don't think she requires any right no 9. Dm2- per primary   Jamal Maes, MD [facility] [phone] Pager 07/01/2016, 5:39 PM

## 2016-07-01 NOTE — Progress Notes (Addendum)
PROGRESS NOTE    Debbie [name]  [id number] DOB: 1931/03/19 DOA: 06/25/2016 PCP: Myriam Jacobson, MD    Brief Narrative:   85 ? 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  She had apparently been doing fine up until 01/17 when she had worsening hypoxia along with hypotension (initial BP 77/54).  In ED, she was found to have AoCKD, hyperkalemia (K 6.5), bradycardia, and hypotension.  She was initially admitted by [facility] to SDU but after she had persistent hypotension, PCCM was consulted.  She was transferred out and taken over by a hospitalist service and/20 and is currently awaiting resolution of kidney function versus initiation of ongoing hemodialysis   Assessment & Plan:   Active Problems:   Renal failure   AKI (acute kidney injury) ([place])   Hyperkalemia   Atrial fibrillation (HCC)   Bradycardia   Central venous catheter in place   Hypovolemic Hyponatremia Initial hyperkalemia 6.5 Acute kidney injury creatinine 7 on admission baseline 1.5 S/p CRRT on ICU Appreciate nephrology follow-up Patient receiving Lasix now 80 twice a day--U OP low? -739 cc only  creatinine now 2.9-->3.5-->4.04  Defer to Nehprology-if UoP remains low, may require HD Per Nephro diurese with 160 lasix bid Had intermittent dialysis 1/22 Her to nephrology for further plan  Acute hypoxic resp failure Low o2 sats 0000000  Diastolic heart failure, grd 2 DD EF 50-55% 05/2017 Previously on torsemide from prior admission Currently all  other meds hydralazine 25 3 times a day, lisinopril 2.5 , atenolol 25 twice a day on hold See above discussion  Episodic bradycardia Hyperkalemia versus beta blocker Resolved. pauses noted less than 2 on monitor.  No current ICD indication or need cardiology eval  Keep on telemetry for the time being and can probably discontinue on 1/24  Recent H flu +/-HCAP Underlying possible COPD-quit in 1990's desat screen Brovana 15 g twice a day, Pulmicort 0.5 twice a day, albuterol every 3 when necessary  Anemia of chronic disease Endometrial adenocarcinoma status post XRT Normocytic anemia Baseline hemoglobin 9 range, currently 7 range. Monitor transfusion threshold 7 Get anemia panel-replace if prn with Aransep/IV iron Iron and tsat low 24/10 Giving IV iron 1/23   DVT prophylaxis:  Code Status: Full Family Communication: d/w son at the bedside  Disposition Plan: unclear currently-watch Uop andwill be guided by nephrology   STUDIES:  CXR 01/17 > CM, vascular congestion. Renal US 1/18> chronic medical renal disease, bilateral renal cysts. No hydro.  CULTURES: None.  ANTIBIOTICS: None.  SIGNIFICANT EVENTS: 01/17 > admit.  LINES/TUBES: None.   Subjective:  fair No f chills but sob No cp No rash Slight confusion  Objective: Vitals:   06/30/16 2006 06/30/16 2100 07/01/16 0649 07/01/16 0853  BP:  (!) 115/40 (!) 110/44 (!) 128/34  Pulse:  60 61 75  Resp:   16 14  Temp:  98 F (36.7 C) 98 F (36.7 C) 98.1 F (36.7 C)  TempSrc:  Oral Oral Oral  SpO2: 98% 100% 100% 93%  Weight:      Height:        Intake/Output Summary (Last 24 hours) at  07/01/16 0946 Last data filed at 07/01/16 0853  Gross per 24 hour  Intake               80 ml  Output             2525 ml  Net            -2445 ml   Filed Weights   06/28/16 2245 06/30/16 1244 06/30/16 1544  Weight: 84.4 kg (186 lb) 85.5 kg (188 lb 7.9 oz) 84 kg (185 lb 3 oz)    Examination:  General exam: Appears  calm and comfortable -R IJ present Respiratory system: Clear to auscultation. Respiratory effort slight increase Cardiovascular system: S1 & S2 heard, RRR. No JVD, murmurs. Gastrointestinal system: Abdomen is nondistended, soft and nontender.  Central nervous system: Alert and oriented. No  deficits. Extremities: Symmetric 5 x 5 power. Skin: No rashes, lesions or ulcers Psychiatry: Judgement and insight appear normal. Mood & affect appropriate.     Data Reviewed: I have personally reviewed following labs and imaging studies  CBC:  Recent Labs Lab 06/25/16 1320 06/25/16 1815 06/27/16 0611 06/29/16 0417 06/30/16 1128  WBC 10.5 9.6 7.5 8.5 8.3  HGB 8.6* 8.6* 7.8* 7.8* 7.8*  HCT 26.6* 25.5* 25.1* 24.7* 25.1*  MCV 92.0 89.8 94.4 95.4 96.2  PLT 270 283 150 138* 0000000   Basic Metabolic Panel:  Recent Labs Lab 06/27/16 0611 06/27/16 0751 06/28/16 1218 06/29/16 0417 06/30/16 0408 07/01/16 0437  NA  --  138 136 137 139 139  K 4.4 4.3 4.9 4.8 4.9 4.4  CL  --  104 104 102 104 102  CO2  --  26 24 26 28 29   GLUCOSE  --  98 105* 71 83 97  BUN  --  25* 27* 32* 37* 21*  CREATININE  --  2.11* 2.95* 3.52* 4.04* 2.92*  CALCIUM  --  7.9* 8.4* 8.3* 8.2* 8.2*  MG 2.3  --   --   --   --   --   PHOS  --  3.0 3.9 4.6 5.6* 4.2   GFR: Estimated Creatinine Clearance: 15.4 mL/min (by C-G formula based on SCr of 2.92 mg/dL (H)). Liver Function Tests:  Recent Labs Lab 06/27/16 0751 06/28/16 1218 06/29/16 0417 06/30/16 0408 07/01/16 0437  ALBUMIN 2.6* 2.7* 2.4* 2.2* 2.3*   No results for input(s): LIPASE, AMYLASE in the last 168 hours. No results for input(s): AMMONIA in the last 168 hours. Coagulation Profile: No results for input(s): INR, PROTIME in the last 168 hours. Cardiac Enzymes:  Recent Labs Lab 06/26/16 0130 06/26/16 0522  TROPONINI 0.04* 0.04*   BNP (last 3 results) No results for input(s): PROBNP in the last 8760 hours. HbA1C: No results for input(s): HGBA1C in  the last 72 hours. CBG:  Recent Labs Lab 06/30/16 0739 06/30/16 1220 06/30/16 1710 06/30/16 2236 07/01/16 0754  GLUCAP 90 113* 97 108* 98   Lipid Profile: No results for input(s): CHOL, HDL, LDLCALC, TRIG, CHOLHDL, LDLDIRECT in the last 72 hours. Thyroid Function Tests: No results for input(s): TSH, T4TOTAL, FREET4, T3FREE, THYROIDAB in the last 72 hours. Anemia Panel:  Recent Labs  06/30/16 1128 06/30/16 1239  VITAMINB12 518  --   FOLATE  --  39.2  FERRITIN 53  --   TIBC 248*  --   IRON 24*  --   RETICCTPCT 3.2*  --    Sepsis Labs:  Recent Labs Lab 06/26/16 0522 06/27/16 0611  PROCALCITON 0.45 0.20  No results found for this or any previous visit (from the past 240 hour(s)).   Radiology Studies: No results found.  Scheduled Meds: . arformoterol  15 mcg Nebulization BID  . aspirin EC  81 mg Oral Daily  . atorvastatin  20 mg Oral q1800  . budesonide (PULMICORT) nebulizer solution  0.5 mg Nebulization BID  . furosemide  160 mg Intravenous BID  . heparin  5,000 Units Subcutaneous Q8H  . insulin aspart  0-9 Units Subcutaneous TID WC  . multivitamin with minerals  1 tablet Oral Daily  . sodium chloride  500 mL Intravenous Once  . sodium chloride flush  10-40 mL Intracatheter Q12H   Continuous Infusions:   LOS: 6 days    Time spent: [place], [place], MD [facility] Pager [phone]  If 7PM-7AM, please contact night-coverage [email or web address] Password [facility] 07/01/2016, 9:46 AM

## 2016-07-02 DIAGNOSIS — I5032 Chronic diastolic (congestive) heart failure: Secondary | ICD-10-CM

## 2016-07-02 DIAGNOSIS — D649 Anemia, unspecified: Secondary | ICD-10-CM

## 2016-07-02 LAB — RENAL FUNCTION PANEL
ALBUMIN: 2.3 g/dL — AB (ref 3.5–5.0)
Anion gap: 10 (ref 5–15)
BUN: 26 mg/dL — AB (ref 6–20)
CHLORIDE: 100 mmol/L — AB (ref 101–111)
CO2: 30 mmol/L (ref 22–32)
CREATININE: 3.58 mg/dL — AB (ref 0.44–1.00)
Calcium: 8.3 mg/dL — ABNORMAL LOW (ref 8.9–10.3)
GFR calc Af Amer: 12 mL/min — ABNORMAL LOW (ref >60)
GFR, EST NON AFRICAN AMERICAN: 11 mL/min — AB (ref >60)
GLUCOSE: 87 mg/dL (ref 65–99)
PHOSPHORUS: 4.4 mg/dL (ref 2.5–4.6)
Potassium: 4.2 mmol/L (ref 3.5–5.1)
Sodium: 140 mmol/L (ref 135–145)

## 2016-07-02 LAB — CBC
HCT: 23.8 % — ABNORMAL LOW (ref 36.0–46.0)
Hemoglobin: 7.4 g/dL — ABNORMAL LOW (ref 12.0–15.0)
MCH: 30 pg (ref 26.0–34.0)
MCHC: 31.1 g/dL (ref 30.0–36.0)
MCV: 96.4 fL (ref 78.0–100.0)
PLATELETS: 155 10*3/uL (ref 150–400)
RBC: 2.47 MIL/uL — ABNORMAL LOW (ref 3.87–5.11)
RDW: 17.5 % — AB (ref 11.5–15.5)
WBC: 7.6 10*3/uL (ref 4.0–10.5)

## 2016-07-02 LAB — GLUCOSE, CAPILLARY
GLUCOSE-CAPILLARY: 143 mg/dL — AB (ref 65–99)
Glucose-Capillary: 99 mg/dL (ref 65–99)
Glucose-Capillary: 98 mg/dL (ref 65–99)
Glucose-Capillary: 105 mg/dL — ABNORMAL HIGH (ref 65–99)

## 2016-07-02 MED ORDER — DARBEPOETIN ALFA 150 MCG/0.3ML IJ SOSY
150.0000 ug | PREFILLED_SYRINGE | INTRAMUSCULAR | Status: DC
  Administered 2016-07-02 – 2016-07-09 (×2): 150 ug via SUBCUTANEOUS
  Filled 2016-07-02 (×3): qty 0.3

## 2016-07-02 MED ORDER — SODIUM CHLORIDE 0.9 % IV SOLN
510.0000 mg | Freq: Once | INTRAVENOUS | Status: AC
  Administered 2016-07-02: 510 mg via INTRAVENOUS
  Filled 2016-07-02: qty 17

## 2016-07-02 NOTE — Care Management Note (Addendum)
Case Management Note  Patient Details  Name: EMMERY SEILER MRN: [id number] Date of Birth: 01-08-1931  Subjective/Objective:              CM following for progression and d/c plannning..       Action/Plan: 07/02/2016 Met with son and PT Donnella Sham re d/c plan. Pt is from SNF, pt and son plan for pt to d/c to home . Son is arranging [facility] aide services. CM will arrange [facility] and HHPT/ HHOT services per family choice. Son is arranging for a ramp to be built. Will need wheelchair and 3:1 commode.  Pt has transportation to HD if needed. Will continue to follow.   Expected Discharge Date:   (UNKNOWN)               Expected Discharge Plan:  [place] ([facility])  In-House Referral:  Clinical Social Work  Discharge planning Services  CM Consult  Post Acute Care Choice:    Choice offered to:     DME Arranged:    DME Agency:     HH Arranged:    [place] Agency:     Status of Service:  In process, will continue to follow  If discussed at Long Length of Stay Meetings, dates discussed:    Additional Comments:  Adron Bene, RN 07/02/2016, 4:24 PM

## 2016-07-02 NOTE — Progress Notes (Signed)
[facility] Progress Note    Subjective:    Continues to make good urine Says feels better today Can't get phlegm out of back of her throat. RN "suctioned it out" last PM   Objective:   BP (!) 112/48 (BP Location: Right Arm)   Pulse 77   Temp 97.8 F (36.6 C) (Oral)   Resp 18   Ht 5\' 5"  (1.651 m)   Wt 84 kg (185 lb 3 oz)   SpO2 96%   BMI 30.82 kg/m   Intake/Output Summary (Last 24 hours) at 07/02/16 1514 Last data filed at 07/02/16 1025  Gross per 24 hour  Intake              240 ml  Output             1225 ml  Net             -985 ml   Weight change:   Physical Exam: Very nice elderly woman, NAD Son in room with him No increased WOB today 6 cm JVP HR 70's, irregular S1S2 No S3 Abd soft and non-tender 1+ edema LE's R IJ Trialysis cath in place   Recent Labs Lab 06/26/16 1535  06/27/16 0611 06/27/16 0751 06/28/16 1218 06/29/16 0417 06/30/16 0408 07/01/16 0437 07/02/16 0404  NA 138  --   --  138 136 137 139 139 140  K 4.9  < > 4.4 4.3 4.9 4.8 4.9 4.4 4.2  CL 103  --   --  104 104 102 104 102 100*  CO2 25  --   --  26 24 26 28 29 30   GLUCOSE 105*  --   --  98 105* 71 83 97 87  BUN 55*  --   --  25* 27* 32* 37* 21* 26*  CREATININE 3.59*  --   --  2.11* 2.95* 3.52* 4.04* 2.92* 3.58*  CALCIUM 8.0*  --   --  7.9* 8.4* 8.3* 8.2* 8.2* 8.3*  PHOS 4.7*  --   --  3.0 3.9 4.6 5.6* 4.2 4.4  < > = values in this interval not displayed.   Recent Labs Lab 06/27/16 0611 06/29/16 0417 06/30/16 1128 07/02/16 0404  WBC 7.5 8.5 8.3 7.6  HGB 7.8* 7.8* 7.8* 7.4*  HCT 25.1* 24.7* 25.1* 23.8*  MCV 94.4 95.4 96.2 96.4  PLT 150 138* 158 155   Iron/TIBC/Ferritin/ %Sat    Component Value Date/Time   IRON 24 (L) 06/30/2016 1128   TIBC 248 (L) 06/30/2016 1128   FERRITIN 53 06/30/2016 1128   IRONPCTSAT 10 (L) 06/30/2016 1128   Medications:    . arformoterol  15 mcg Nebulization BID  . aspirin EC  81 mg Oral Daily  . atorvastatin  20 mg Oral  q1800  . budesonide (PULMICORT) nebulizer solution  0.5 mg Nebulization BID  . furosemide  160 mg Intravenous BID  . heparin  5,000 Units Subcutaneous Q8H  . insulin aspart  0-9 Units Subcutaneous TID WC  . multivitamin with minerals  1 tablet Oral Daily  . sodium chloride  500 mL Intravenous Once  . sodium chloride flush  10-40 mL Intracatheter Q12H     Assessment/ Plan:    1. AoCKD3, likely 2/2 diuretics + ACEi, no obstruction on renal US.  She had AKI  last hospitalization and was discharged with a Cr of 2.1; HCTZ/ lisinopril held on discharge but resumed as an outpatient.  Upon this admission, pt initially required CRRT for  vol overload/ hypotension, both improved and this was d/c'd, she was transferred to floor on 1/19.  Trial of high-dose Lasix produced some urine output but creatinine was still rising - had HD 1/22 for volume removal.  Now making urine - 975 so far today (1025 all of yesterday). Creatinine still rising in the interdialytic interval. Will see if plateau's - no dialysis indication today.  2. Hyperkalemia 2/2 #1 and ACEi use- resolved. NO MORE ACE FOR HER 3. Anemia - Hb down to 7.4. TSat very low. Got small dose of infed in dialysis 1/22. I'm not a fan of infed and only got small dose. Will give dose of Feraheme 510  and dose of Aranesp 150  4. Bradycardia, SVR in setting of AFib on BB- bradycardia improved, off BB 5. Volume overload improved with HD 1/22 with 1.5 L off plus additional UOP. Continue Lasix 160 IV BID 6. Acute hypoxic respiratory failure: Stable O2 requirement today. Decreased pulm reserve in setting of known COPD and recent HCA 7. Recent HCAP and +flu, pulm congestion and effusions (discharged 06/13/16 from [facility]).  Finished a course of Tamiflu and Levaquin.  Does not appear to have been discharged on home O2 8. COPD: on Brovana and pulmicort 9. HTN- holding antihypertensives for now; recommend adding back amlodipine first if needed but don't think she requires  any right no 10. Dm2- per primary   Jamal Maes, MD [facility] [phone] Pager 07/02/2016, 3:14 PM

## 2016-07-02 NOTE — Progress Notes (Signed)
[facility] PROGRESS NOTE  Debbie [name] [id number] DOB: Sep 23, 1930 DOA: 06/25/2016  PCP: Myriam Jacobson, MD  Brief History/Interval Summary: 83 year old female with a past medical history of COPD, oxygen dependent, diastolic CHF, diabetes, chronic kidney disease, presented with shortness of breath and was found to have low oxygen levels and low blood pressure. She was found to have acute on chronic kidney disease. Initially admitted to step down then had to be transferred to ICU due to hypotension. Started on hemodialysis.  Reason for Visit: Acute renal failure on chronic kidney disease  Consultants: Nephrology  Procedures: Hemodialysis  Antibiotics: None  Subjective/Interval History: Patient denies any chest pain. States that her breathing is improved. Denies any abdominal pain.  ROS: Denies any nausea or vomiting  Objective:  Vital Signs  Vitals:   07/01/16 2124 07/02/16 0625 07/02/16 0836 07/02/16 1000  BP: (!) 129/49 (!) 117/48  (!) 112/48  Pulse: 66 62  77  Resp: 17 17  18   Temp: 98.4 F (36.9 C) 98.5 F (36.9 C)  97.8 F (36.6 C)  TempSrc: Oral Oral  Oral  SpO2: 98% 98% 95% 96%  Weight:      Height:        Intake/Output Summary (Last 24 hours) at 07/02/16 1332 Last data filed at 07/02/16 0900  Gross per 24 hour  Intake              480 ml  Output              975 ml  Net             -495 ml   Filed Weights   06/28/16 2245 06/30/16 1244 06/30/16 1544  Weight: 84.4 kg (186 lb) 85.5 kg (188 lb 7.9 oz) 84 kg (185 lb 3 oz)    General appearance: alert, cooperative, appears stated age and no distress Resp: Diminished air entry at the bases without any wheezing, rhonchi. Few crackles. Cardio: regular rate and rhythm, S1, S2 normal, no murmur, click, rub or gallop GI: soft, non-tender; bowel sounds normal; no masses,  no organomegaly Extremities: Minimal edema bilateral lower extremities Neurologic: No focal deficits  Lab  Results:  Data Reviewed: I have personally reviewed following labs and imaging studies  CBC:  Recent Labs Lab 06/25/16 1815 06/27/16 0611 06/29/16 0417 06/30/16 1128 07/02/16 0404  WBC 9.6 7.5 8.5 8.3 7.6  HGB 8.6* 7.8* 7.8* 7.8* 7.4*  HCT 25.5* 25.1* 24.7* 25.1* 23.8*  MCV 89.8 94.4 95.4 96.2 96.4  PLT 283 150 138* 158 [id number]    Basic Metabolic Panel:  Recent Labs Lab 06/27/16 0611  06/28/16 1218 06/29/16 0417 06/30/16 0408 07/01/16 0437 07/02/16 0404  NA  --   < > 136 137 139 139 140  K 4.4  < > 4.9 4.8 4.9 4.4 4.2  CL  --   < > 104 102 104 102 100*  CO2  --   < > 24 26 28 29 30   GLUCOSE  --   < > 105* 71 83 97 87  BUN  --   < > 27* 32* 37* 21* 26*  CREATININE  --   < > 2.95* 3.52* 4.04* 2.92* 3.58*  CALCIUM  --   < > 8.4* 8.3* 8.2* 8.2* 8.3*  MG 2.3  --   --   --   --   --   --   PHOS  --   < > 3.9 4.6 5.6* 4.2 4.4  < > =  values in this interval not displayed.  GFR: Estimated Creatinine Clearance: 12.5 mL/min (by C-G formula based on SCr of 3.58 mg/dL (H)).  Liver Function Tests:  Recent Labs Lab 06/28/16 1218 06/29/16 0417 06/30/16 0408 07/01/16 0437 07/02/16 0404  ALBUMIN 2.7* 2.4* 2.2* 2.3* 2.3*    Cardiac Enzymes:  Recent Labs Lab 06/26/16 0130 06/26/16 0522  TROPONINI 0.04* 0.04*    CBG:  Recent Labs Lab 07/01/16 1230 07/01/16 1610 07/01/16 2120 07/02/16 0757 07/02/16 1213  GLUCAP 151* 115* 123* 99 98    Anemia Panel:  Recent Labs  06/30/16 1128 06/30/16 1239  VITAMINB12 518  --   FOLATE  --  39.2  FERRITIN 53  --   TIBC 248*  --   IRON 24*  --   RETICCTPCT 3.2*  --     Radiology Studies: No results found.   Medications:  Scheduled: . arformoterol  15 mcg Nebulization BID  . aspirin EC  81 mg Oral Daily  . atorvastatin  20 mg Oral q1800  . budesonide (PULMICORT) nebulizer solution  0.5 mg Nebulization BID  . furosemide  160 mg Intravenous BID  . heparin  5,000 Units Subcutaneous Q8H  . insulin aspart  0-9  Units Subcutaneous TID WC  . multivitamin with minerals  1 tablet Oral Daily  . sodium chloride  500 mL Intravenous Once  . sodium chloride flush  10-40 mL Intracatheter Q12H   Continuous:  [id number] chloride, sodium chloride, acetaminophen **OR** acetaminophen, albuterol, alteplase, heparin, lidocaine (PF), lidocaine-prilocaine, ondansetron **OR** ondansetron (ZOFRAN) IV, pentafluoroprop-tetrafluoroeth, sodium chloride, sodium chloride flush  Assessment/Plan:  Active Problems:   Renal failure   AKI (acute kidney injury) ([place])   Hyperkalemia   Atrial fibrillation (HCC)   Bradycardia   Central venous catheter in place    Acute kidney injury on chronic kidney disease, stage 3 Creatinine 7 on admission with a known baseline of 1.5. Patient was initially admitted to ICU. S/p CRRT on ICU. Nephrology is following. Now on intermittent hemodialysis. Has good urine output. Defer management to nephrology.  Acute hypoxic resp failure Likely due to pulmonary edema. Seems to be improving. Continues to need oxygen, which apparently she uses also home.   Chronic Diastolic heart failure This is based off of an echocardiogram from December. Previously on torsemide. Now volume is mainly being addressed with dialysis and high-dose Lasix.  Episodic bradycardia Thought to be secondary to Hyperkalemia versus beta blocker. Resolved.  Recent H flu +/-HCAP Stable  History of COPD  Stable. Apparently on home oxygen. Continue home medications.   Anemia of chronic disease Hemoglobin is low but stable. No evidence for overt bleeding. Low iron and ferritin levels noted. Parenteral Iron ordered.  History of Endometrial adenocarcinoma status post XRT Stable   DVT Prophylaxis: Subcutaneous heparin    Code Status: Full code  Family Communication: Discussed with the patient  Disposition Plan: Mobilize. Management per nephrology.   LOS: 7 days   [facility] Pager  [phone] 07/02/2016, 1:32 PM  If 7PM-7AM, please contact night-coverage at [email or web address], password [email or web address]

## 2016-07-03 ENCOUNTER — Inpatient Hospital Stay (HOSPITAL_COMMUNITY): Payer: Medicare HMO

## 2016-07-03 ENCOUNTER — Ambulatory Visit: Payer: Medicare HMO | Admitting: Radiation Oncology

## 2016-07-03 DIAGNOSIS — J449 Chronic obstructive pulmonary disease, unspecified: Secondary | ICD-10-CM

## 2016-07-03 DIAGNOSIS — D638 Anemia in other chronic diseases classified elsewhere: Secondary | ICD-10-CM

## 2016-07-03 LAB — CBC
HCT: 24.8 % — ABNORMAL LOW (ref 36.0–46.0)
Hemoglobin: 7.6 g/dL — ABNORMAL LOW (ref 12.0–15.0)
MCH: 29.6 pg (ref 26.0–34.0)
MCHC: 30.6 g/dL (ref 30.0–36.0)
MCV: 96.5 fL (ref 78.0–100.0)
PLATELETS: 227 10*3/uL (ref 150–400)
RBC: 2.57 MIL/uL — AB (ref 3.87–5.11)
RDW: 17.7 % — AB (ref 11.5–15.5)
WBC: 7.9 10*3/uL (ref 4.0–10.5)

## 2016-07-03 LAB — RENAL FUNCTION PANEL
ALBUMIN: 2.3 g/dL — AB (ref 3.5–5.0)
Anion gap: 9 (ref 5–15)
BUN: 30 mg/dL — ABNORMAL HIGH (ref 6–20)
CHLORIDE: 101 mmol/L (ref 101–111)
CO2: 30 mmol/L (ref 22–32)
Calcium: 8.5 mg/dL — ABNORMAL LOW (ref 8.9–10.3)
Creatinine, Ser: 3.96 mg/dL — ABNORMAL HIGH (ref 0.44–1.00)
GFR, EST AFRICAN AMERICAN: 11 mL/min — AB (ref >60)
GFR, EST NON AFRICAN AMERICAN: 10 mL/min — AB (ref >60)
Glucose, Bld: 93 mg/dL (ref 65–99)
PHOSPHORUS: 4.9 mg/dL — AB (ref 2.5–4.6)
Potassium: 4.7 mmol/L (ref 3.5–5.1)
Sodium: 140 mmol/L (ref 135–145)

## 2016-07-03 LAB — GLUCOSE, CAPILLARY
GLUCOSE-CAPILLARY: 90 mg/dL (ref 65–99)
GLUCOSE-CAPILLARY: 102 mg/dL — AB (ref 65–99)
GLUCOSE-CAPILLARY: 115 mg/dL — AB (ref 65–99)
GLUCOSE-CAPILLARY: 133 mg/dL — AB (ref 65–99)

## 2016-07-03 NOTE — Progress Notes (Signed)
Physical Therapy Treatment Patient Details Name: Debbie [name] MRN: [id number] DOB: 09/30/1930 Today's Date: 07/03/2016    History of Present Illness Pt is an 83 y/o female admitted from a SNF secondary to dyspnea and hyperkalemia. 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    PT Comments    Slow to improve.  Limited by fatigue, but works consistently with therapies after some encouragement.  Pt's son is thinking about taking pt home, but will need significant supervision.  Follow Up Recommendations  Home health PT;Supervision/Assistance - 24 hour     Equipment Recommendations  3in1 (PT)    Recommendations for Other Services       Precautions / Restrictions Precautions Precautions: Fall Precaution Comments: supplemental O2 dependent; monitor SPO2    Mobility  Bed Mobility Overal bed mobility: Needs Assistance Bed Mobility: Supine to Sit     Supine to sit: Min assist     General bed mobility comments: increased time, use of rails and minimal assist  Transfers Overall transfer level: Needs assistance Equipment used: Rolling walker (2 wheeled) Transfers: Sit to/from Stand Sit to Stand: Min assist         General transfer comment: cues for hand placement,  needed assist to come forward and up.  Ambulation/Gait Ambulation/Gait assistance: Min assist Ambulation Distance (Feet): 44 Feet Assistive device: Rolling walker (2 wheeled) Gait Pattern/deviations: Step-through pattern;Decreased step length - right;Decreased step length - left Gait velocity: slower Gait velocity interpretation: Below normal speed for age/gender General Gait Details: SpO2 89%, EHR upper 90's   Stairs            Wheelchair Mobility    Modified Rankin (Stroke Patients Only)       Balance Overall balance assessment: Needs  assistance Sitting-balance support: Feet supported Sitting balance-Leahy Scale: Fair     Standing balance support: Bilateral upper extremity supported Standing balance-Leahy Scale: Poor Standing balance comment: reliant on the RW                    Cognition Arousal/Alertness: Awake/alert Behavior During Therapy: WFL for tasks assessed/performed Overall Cognitive Status: Within Functional Limits for tasks assessed                      Exercises General Exercises - Lower Extremity Ankle Circles/Pumps: AROM;Both;20 reps;Supine Heel Slides: AROM;Strengthening;Both;10 reps;Supine (graded resistance extension) Hip ABduction/ADduction: AAROM;Strengthening;Both;10 reps;Supine Straight Leg Raises: AROM;AAROM;Both;10 reps;Supine    General Comments        Pertinent Vitals/Pain Pain Assessment: Faces Faces Pain Scale: No hurt    Home Living                      Prior Function            PT Goals (current goals can now be found in the care plan section) Acute Rehab PT Goals Patient Stated Goal: none stated PT Goal Formulation: With patient/family Time For Goal Achievement: 07/12/16 Potential to Achieve Goals: Fair Progress towards PT goals: Progressing toward goals    Frequency    Min 3X/week      PT Plan Current plan remains appropriate    Co-evaluation             End of Session Equipment Utilized During Treatment: Oxygen Activity Tolerance: Patient tolerated treatment well;Patient limited by fatigue Patient left: in chair;with call bell/phone within  reach;with chair alarm set     Time: [phone] PT Time Calculation (min) (ACUTE ONLY): 29 min  Charges:  $Gait Training: 8-22 mins $Therapeutic Exercise: 8-22 mins                    G CodesTessie Fass Harjas Biggins 07/03/2016, 5:43 PM 07/03/2016  Donnella Sham, PT [phone]  (pager)

## 2016-07-03 NOTE — Progress Notes (Signed)
[facility] Progress Note    Subjective:    Excellent UOP past 24 hours (2.3 liters recorded) on Lasix 160 IV BID Last HD was 1/22 Creatinine has not reached plateau but rate of rise appears to be decreasing Says breathing is about the same Son would like PT to come by    Objective:   BP (!) 113/54 (BP Location: Right Arm)   Pulse 60   Temp 97.7 F (36.5 C) (Oral)   Resp 18   Ht 5\' 5"  (1.651 m)   Wt 84.2 kg (185 lb 10 oz)   SpO2 95%   BMI 30.89 kg/m   Intake/Output Summary (Last 24 hours) at 07/03/16 1037 Last data filed at 07/03/16 0940  Gross per 24 hour  Intake             1213 ml  Output             2125 ml  Net             -912 ml   Weight change:   Physical Exam: Very nice elderly woman, NAD Son in room with him No increased WOB today 6 cm JVP HR 70's, irregular Breath sounds diminished with mild end exp wheeze S1S2 No S3 Abd soft and non-tender 1+ edema LE's - mostly post thighs R IJ Trialysis cath in place   Recent Labs Lab 06/27/16 0751 06/28/16 1218 06/29/16 0417 06/30/16 0408 07/01/16 0437 07/02/16 0404 07/03/16 0415  NA 138 136 137 139 139 140 140  K 4.3 4.9 4.8 4.9 4.4 4.2 4.7  CL 104 104 102 104 102 100* 101  CO2 26 24 26 28 29 30 30   GLUCOSE 98 105* 71 83 97 87 93  BUN 25* 27* 32* 37* 21* 26* 30*  CREATININE 2.11* 2.95* 3.52* 4.04* 2.92* 3.58* 3.96*  CALCIUM 7.9* 8.4* 8.3* 8.2* 8.2* 8.3* 8.5*  PHOS 3.0 3.9 4.6 5.6* 4.2 4.4 4.9*     Recent Labs Lab 06/29/16 0417 06/30/16 1128 07/02/16 0404 07/03/16 0415  WBC 8.5 8.3 7.6 7.9  HGB 7.8* 7.8* 7.4* 7.6*  HCT 24.7* 25.1* 23.8* 24.8*  MCV 95.4 96.2 96.4 96.5  PLT 138* 158 155 227   Iron/TIBC/Ferritin/ %Sat    Component Value Date/Time   IRON 24 (L) 06/30/2016 1128   TIBC 248 (L) 06/30/2016 1128   FERRITIN 53 06/30/2016 1128   IRONPCTSAT 10 (L) 06/30/2016 1128   Medications:    . arformoterol  15 mcg Nebulization BID  . aspirin EC  81 mg Oral Daily  .  atorvastatin  20 mg Oral q1800  . budesonide (PULMICORT) nebulizer solution  0.5 mg Nebulization BID  . darbepoetin (ARANESP) injection - NON-DIALYSIS  150 mcg Subcutaneous Q Wed-1800  . furosemide  160 mg Intravenous BID  . heparin  5,000 Units Subcutaneous Q8H  . insulin aspart  0-9 Units Subcutaneous TID WC  . multivitamin with minerals  1 tablet Oral Daily  . sodium chloride  500 mL Intravenous Once  . sodium chloride flush  10-40 mL Intracatheter Q12H   Background: 79F with AoCKD3 and life threatening hyperkalemia not responsive to Med Rx, 2/2 diuretics + ACE, profound bradycardia. Renal consulted 1/17, pr transferred from [facility], had CRRT 1/17-1/19, IHD on 1/22. Baseline creatinine prior to this episode AKI 2.1-2.2   Assessment/ Plan:    1. AoCKD3, likely 2/2 diuretics + ACEi + documented hypotension (She also had AKI  06/2016  hospitalization and was discharged with a Cr of  2.1; HCTZ/ lisinopril held on discharge but resumed as an outpatient).  Upon this admission, pt initially required CRRT 1/17-1/19 for vol overload/ hypotension/hyperkalemia, Trial of high-dose Lasix produced some urine output but creatinine was still rising - had HD 1/22 for volume removal.  Now making excellent urine with lasix, creatinine may be reaching plateau. No indications for HD today. CXR today.  2. Hyperkalemia 2/2 #1 and ACEi use- resolved. NO MORE ACE FOR HER  3. Anemia - Hb down to 7.4. TSat very low. Got small dose of infed in dialysis 1/22. I'm not a fan of infed and only got small dose. Dosed Feraheme 510 on 1/24 started Aranesp 150/week 1/24  4. Bradycardia, SVR in setting of AFib on BB- bradycardia improved, off BB 5. Volume overload improved with HD 1/22 with 1.5 L off . Making good urine with IV lasix 6. Acute hypoxic respiratory failure: COPD + recent adm for HCAP/influenza (06/2016). Stable O2 requirement.  7. Recent HCAP and +flu, pulm congestion and effusions (discharged 06/13/16 from [facility]).   Finished a course of Tamiflu and Levaquin.   8. COPD: on Brovana/pulmicort at this time. ? add nebs?? Defer to primary. 9. HTN-Off BP meds and none needed 10. Dm2- per primary   Jamal Maes, MD [facility] [phone] Pager 07/03/2016, 10:37 AM

## 2016-07-03 NOTE — Progress Notes (Signed)
[facility] PROGRESS NOTE  Debbie [name] [id number] DOB: 02/25/1931 DOA: 06/25/2016  PCP: Myriam Jacobson, MD  Brief History/Interval Summary: 83 year old female with a past medical history of COPD, oxygen dependent, diastolic CHF, diabetes, chronic kidney disease, presented with shortness of breath and was found to have low oxygen levels and low blood pressure. She was found to have acute on chronic kidney disease. Initially admitted to step down then had to be transferred to ICU due to hypotension. Started on hemodialysis.  Reason for Visit: Acute renal failure on chronic kidney disease  Consultants: Nephrology  Procedures: Hemodialysis  Antibiotics: None  Subjective/Interval History: Patient denies any complaints. No chest pain or shortness of breath.   ROS: Denies any nausea or vomiting  Objective:  Vital Signs  Vitals:   07/02/16 2038 07/02/16 2039 07/03/16 0607 07/03/16 0814  BP:  (!) 112/56 (!) 113/98   Pulse:  62 77   Resp:  19 20   Temp:  97.5 F (36.4 C) 98.6 F (37 C)   TempSrc:  Oral Oral   SpO2: 98% 94% 95% 92%  Weight:  84.2 kg (185 lb 10 oz)    Height:        Intake/Output Summary (Last 24 hours) at 07/03/16 0935 Last data filed at 07/03/16 [id number]  Gross per 24 hour  Intake              873 ml  Output             2375 ml  Net            -1502 ml   Filed Weights   06/30/16 1244 06/30/16 1544 07/02/16 2039  Weight: 85.5 kg (188 lb 7.9 oz) 84 kg (185 lb 3 oz) 84.2 kg (185 lb 10 oz)    General appearance: alert, cooperative, appears stated age and no distress Resp: Diminished air entry at the bases without any wheezing, rhonchi. Few crackles. Cardio: regular rate and rhythm, S1, S2 normal, no murmur, click, rub or gallop GI: soft, non-tender; bowel sounds normal; no masses,  no organomegaly Extremities: Minimal edema bilateral lower extremities Neurologic: No focal deficits  Lab Results:  Data Reviewed: I have personally  reviewed following labs and imaging studies  CBC:  Recent Labs Lab 06/27/16 0611 06/29/16 0417 06/30/16 1128 07/02/16 0404 07/03/16 0415  WBC 7.5 8.5 8.3 7.6 7.9  HGB 7.8* 7.8* 7.8* 7.4* 7.6*  HCT 25.1* 24.7* 25.1* 23.8* 24.8*  MCV 94.4 95.4 96.2 96.4 96.5  PLT 150 138* 158 155 [id number]    Basic Metabolic Panel:  Recent Labs Lab 06/27/16 0611  06/29/16 0417 06/30/16 0408 07/01/16 0437 07/02/16 0404 07/03/16 0415  NA  --   < > 137 139 139 140 140  K 4.4  < > 4.8 4.9 4.4 4.2 4.7  CL  --   < > 102 104 102 100* 101  CO2  --   < > 26 28 29 30 30   GLUCOSE  --   < > 71 83 97 87 93  BUN  --   < > 32* 37* 21* 26* 30*  CREATININE  --   < > 3.52* 4.04* 2.92* 3.58* 3.96*  CALCIUM  --   < > 8.3* 8.2* 8.2* 8.3* 8.5*  MG 2.3  --   --   --   --   --   --   PHOS  --   < > 4.6 5.6* 4.2 4.4 4.9*  < > = values  in this interval not displayed.  GFR: Estimated Creatinine Clearance: 11.3 mL/min (by C-G formula based on SCr of 3.96 mg/dL (H)).  Liver Function Tests:  Recent Labs Lab 06/29/16 0417 06/30/16 0408 07/01/16 0437 07/02/16 0404 07/03/16 0415  ALBUMIN 2.4* 2.2* 2.3* 2.3* 2.3*    Cardiac Enzymes: No results for input(s): CKTOTAL, CKMB, CKMBINDEX, TROPONINI in the last 168 hours.  CBG:  Recent Labs Lab 07/02/16 0757 07/02/16 1213 07/02/16 1641 07/02/16 2025 07/03/16 0753  GLUCAP 99 98 105* 143* 90    Anemia Panel:  Recent Labs  06/30/16 1128 06/30/16 1239  VITAMINB12 518  --   FOLATE  --  39.2  FERRITIN 53  --   TIBC 248*  --   IRON 24*  --   RETICCTPCT 3.2*  --     Radiology Studies: No results found.   Medications:  Scheduled: . arformoterol  15 mcg Nebulization BID  . aspirin EC  81 mg Oral Daily  . atorvastatin  20 mg Oral q1800  . budesonide (PULMICORT) nebulizer solution  0.5 mg Nebulization BID  . darbepoetin (ARANESP) injection - NON-DIALYSIS  150 mcg Subcutaneous Q Wed-1800  . furosemide  160 mg Intravenous BID  . heparin  5,000 Units  Subcutaneous Q8H  . insulin aspart  0-9 Units Subcutaneous TID WC  . multivitamin with minerals  1 tablet Oral Daily  . sodium chloride  500 mL Intravenous Once  . sodium chloride flush  10-40 mL Intracatheter Q12H   Continuous:  [id number] chloride, sodium chloride, acetaminophen **OR** acetaminophen, albuterol, alteplase, heparin, lidocaine (PF), lidocaine-prilocaine, ondansetron **OR** ondansetron (ZOFRAN) IV, pentafluoroprop-tetrafluoroeth, sodium chloride, sodium chloride flush  Assessment/Plan:  Active Problems:   Renal failure   AKI (acute kidney injury) ([place])   Hyperkalemia   Atrial fibrillation (HCC)   Bradycardia   Central venous catheter in place    Acute kidney injury on chronic kidney disease, stage 3 Creatinine 7 on admission with a known baseline of 1.5. Patient was initially admitted to ICU. S/p CRRT on ICU. Nephrology is following. Now on intermittent hemodialysis. Has good urine output. Defer management to nephrology.  Acute hypoxic resp failure Likely due to pulmonary edema. Seems to be improving. Continues to need oxygen, which apparently she uses at home as well.  Chronic Diastolic heart failure This is based off of an echocardiogram from December. Previously on torsemide. Now volume is mainly being addressed with dialysis and high-dose Lasix.  Episodic bradycardia Thought to be secondary to Hyperkalemia versus beta blocker. Resolved.  Recent H flu +/-HCAP Stable  History of COPD  Stable. Apparently on home oxygen. Continue home medications. No wheezing noted. Continue current medications.  Anemia of chronic disease Hemoglobin is low but stable. No evidence for overt bleeding. Low iron and ferritin levels noted. Parenteral Iron was given.  History of Endometrial adenocarcinoma status post XRT Stable   DVT Prophylaxis: Subcutaneous heparin    Code Status: Full code  Family Communication: Discussed with the patient  Disposition Plan: Mobilize.  Patient wishes to go home instead of skilled nursing facility when ready for discharge. Discharge when cleared by nephrology.   LOS: 8 days   [facility] [facility] Pager [phone] 07/03/2016, 9:35 AM  If 7PM-7AM, please contact night-coverage at [email or web address], password [facility]

## 2016-07-03 NOTE — Care Management Important Message (Signed)
Important Message  Patient Details  Name: Debbie [name] MRN: [id number] Date of Birth: 04-Aug-1930   Medicare Important Message Given:  Yes    Geralynn Capri Abena 07/03/2016, 11:40 AM

## 2016-07-04 LAB — CBC
HEMATOCRIT: 24.7 % — AB (ref 36.0–46.0)
Hemoglobin: 7.6 g/dL — ABNORMAL LOW (ref 12.0–15.0)
MCH: 29.8 pg (ref 26.0–34.0)
MCHC: 30.8 g/dL (ref 30.0–36.0)
MCV: 96.9 fL (ref 78.0–100.0)
PLATELETS: 245 10*3/uL (ref 150–400)
RBC: 2.55 MIL/uL — AB (ref 3.87–5.11)
RDW: 17.9 % — ABNORMAL HIGH (ref 11.5–15.5)
WBC: 7.5 10*3/uL (ref 4.0–10.5)

## 2016-07-04 LAB — GLUCOSE, CAPILLARY
GLUCOSE-CAPILLARY: 105 mg/dL — AB (ref 65–99)
GLUCOSE-CAPILLARY: 120 mg/dL — AB (ref 65–99)
GLUCOSE-CAPILLARY: 107 mg/dL — AB (ref 65–99)
Glucose-Capillary: 90 mg/dL (ref 65–99)

## 2016-07-04 LAB — RENAL FUNCTION PANEL
Albumin: 2.3 g/dL — ABNORMAL LOW (ref 3.5–5.0)
Anion gap: 10 (ref 5–15)
BUN: 35 mg/dL — ABNORMAL HIGH (ref 6–20)
CHLORIDE: 100 mmol/L — AB (ref 101–111)
CO2: 30 mmol/L (ref 22–32)
CREATININE: 4.32 mg/dL — AB (ref 0.44–1.00)
Calcium: 8.6 mg/dL — ABNORMAL LOW (ref 8.9–10.3)
GFR, EST AFRICAN AMERICAN: 10 mL/min — AB (ref >60)
GFR, EST NON AFRICAN AMERICAN: 9 mL/min — AB (ref >60)
Glucose, Bld: 78 mg/dL (ref 65–99)
POTASSIUM: 4.5 mmol/L (ref 3.5–5.1)
Phosphorus: 5.3 mg/dL — ABNORMAL HIGH (ref 2.5–4.6)
Sodium: 140 mmol/L (ref 135–145)

## 2016-07-04 NOTE — Progress Notes (Signed)
[facility] PROGRESS NOTE  Debbie [name] [id number] DOB: 28-Sep-1930 DOA: 06/25/2016  PCP: Myriam Jacobson, MD  Brief History/Interval Summary: 83 year old female with a past medical history of COPD, oxygen dependent, diastolic CHF, diabetes, chronic kidney disease, presented with shortness of breath and was found to have low oxygen levels and low blood pressure. She was found to have acute on chronic kidney disease. Initially admitted to step down then had to be transferred to ICU due to hypotension. Started on hemodialysis. She was stabilized and then transferred to the floor.  Reason for Visit: Acute renal failure on chronic kidney disease  Consultants: Nephrology  Procedures: Intermittent Hemodialysis  Antibiotics: None  Subjective/Interval History: Patient feels well. Denies any shortness of breath. No chest pain.   ROS: Denies any nausea or vomiting  Objective:  Vital Signs  Vitals:   07/04/16 0454 07/04/16 0735 07/04/16 0846 07/04/16 1400  BP: (!) 126/54 (!) 150/55  (!) 104/53  Pulse: (!) 57 68  69  Resp: 19 16  18   Temp: 98.1 F (36.7 C) 98.1 F (36.7 C)  98.4 F (36.9 C)  TempSrc: Oral Oral  Oral  SpO2: 100% 98% 95% 95%  Weight:      Height:        Intake/Output Summary (Last 24 hours) at 07/04/16 1436 Last data filed at 07/04/16 1400  Gross per 24 hour  Intake              360 ml  Output             3520 ml  Net            -3160 ml   Filed Weights   06/30/16 1544 07/02/16 2039 07/03/16 2032  Weight: 84 kg (185 lb 3 oz) 84.2 kg (185 lb 10 oz) 84.3 kg (185 lb 13.6 oz)    General appearance: alert, cooperative, appears stated age and no distress Resp: Diminished air entry at the bases without any wheezing, rhonchi. Few crackles. Cardio: regular rate and rhythm, S1, S2 normal, no murmur, click, rub or gallop GI: soft, non-tender; bowel sounds normal; no masses,  no organomegaly Extremities: Minimal edema bilateral lower  extremities Neurologic: No focal deficits  Lab Results:  Data Reviewed: I have personally reviewed following labs and imaging studies  CBC:  Recent Labs Lab 06/29/16 0417 06/30/16 1128 07/02/16 0404 07/03/16 0415 07/04/16 0435  WBC 8.5 8.3 7.6 7.9 7.5  HGB 7.8* 7.8* 7.4* 7.6* 7.6*  HCT 24.7* 25.1* 23.8* 24.8* 24.7*  MCV 95.4 96.2 96.4 96.5 96.9  PLT 138* 158 155 227 [id number]    Basic Metabolic Panel:  Recent Labs Lab 06/30/16 0408 07/01/16 0437 07/02/16 0404 07/03/16 0415 07/04/16 0435  NA 139 139 140 140 140  K 4.9 4.4 4.2 4.7 4.5  CL 104 102 100* 101 100*  CO2 28 29 30 30 30   GLUCOSE 83 97 87 93 78  BUN 37* 21* 26* 30* 35*  CREATININE 4.04* 2.92* 3.58* 3.96* 4.32*  CALCIUM 8.2* 8.2* 8.3* 8.5* 8.6*  PHOS 5.6* 4.2 4.4 4.9* 5.3*    GFR: Estimated Creatinine Clearance: 10.4 mL/min (by C-G formula based on SCr of 4.32 mg/dL (H)).  Liver Function Tests:  Recent Labs Lab 06/30/16 0408 07/01/16 0437 07/02/16 0404 07/03/16 0415 07/04/16 0435  ALBUMIN 2.2* 2.3* 2.3* 2.3* 2.3*    CBG:  Recent Labs Lab 07/03/16 1234 07/03/16 1708 07/03/16 2030 07/04/16 0813 07/04/16 1137  GLUCAP 102* 115* 133* 90 105*  Radiology Studies: Dg Chest 2 View  Result Date: 07/03/2016 CLINICAL DATA:  Cough, congestion, history of CHF EXAM: CHEST  2 VIEW COMPARISON:  Chest x-ray of 06/28/2016 FINDINGS: There is cardiomegaly present with bilateral pleural effusions of moderate size and pulmonary vascular congestion, findings consistent with congestive heart failure. Right central venous line tip overlies the mid SVC. Moderate thoracic aortic atherosclerotic change is present. No acute bony abnormality is seen. There are degenerative changes throughout the thoracic spine. IMPRESSION: 1. CHF with moderate-sized pleural effusions. 2. Moderate thoracic aortic atherosclerosis. Electronically Signed   By: Ivar Drape M.D.   On: 07/03/2016 11:54     Medications:  Scheduled: .  arformoterol  15 mcg Nebulization BID  . aspirin EC  81 mg Oral Daily  . atorvastatin  20 mg Oral q1800  . budesonide (PULMICORT) nebulizer solution  0.5 mg Nebulization BID  . darbepoetin (ARANESP) injection - NON-DIALYSIS  150 mcg Subcutaneous Q Wed-1800  . furosemide  160 mg Intravenous BID  . heparin  5,000 Units Subcutaneous Q8H  . insulin aspart  0-9 Units Subcutaneous TID WC  . multivitamin with minerals  1 tablet Oral Daily  . sodium chloride  500 mL Intravenous Once  . sodium chloride flush  10-40 mL Intracatheter Q12H   Continuous:  [id number] chloride, sodium chloride, acetaminophen **OR** acetaminophen, albuterol, alteplase, heparin, lidocaine (PF), lidocaine-prilocaine, ondansetron **OR** ondansetron (ZOFRAN) IV, pentafluoroprop-tetrafluoroeth, sodium chloride, sodium chloride flush  Assessment/Plan:  Active Problems:   Renal failure   AKI (acute kidney injury) ([place])   Hyperkalemia   Atrial fibrillation (HCC)   Bradycardia   Central venous catheter in place    Acute kidney injury on chronic kidney disease, stage 3 Creatinine 7 on admission with a known baseline of 1.5. Patient was initially admitted to ICU. S/p CRRT while in the ICU. Nephrology is following. Now on intermittent hemodialysis. On high-dose Lasix. Has good urine output. Defer management to nephrology.  Acute hypoxic resp failure Likely due to pulmonary edema. Seems to be improving. Continues to need oxygen, which apparently she uses at home as well.  Chronic Diastolic heart failure This is based off of an echocardiogram from December. Previously on torsemide. Now volume is mainly being addressed with dialysis and high-dose Lasix.  Episodic bradycardia Thought to be secondary to Hyperkalemia versus beta blocker. Resolved.  Recent H flu +/-HCAP Stable  History of COPD  Stable. Apparently on home oxygen. Continue home medications. No wheezing noted. Continue current medications.  Anemia of  chronic disease Hemoglobin is low but stable. No evidence for overt bleeding. Low iron and ferritin levels noted. Parenteral Iron was given.  History of Endometrial adenocarcinoma status post XRT Stable   DVT Prophylaxis: Subcutaneous heparin    Code Status: Full code  Family Communication: Discussed with the patient  Disposition Plan: Mobilize. Patient wishes to go home instead of skilled nursing facility when ready for discharge. Discharge when cleared by nephrology.   LOS: 9 days   [facility] Pager [phone] 07/04/2016, 2:36 PM  If 7PM-7AM, please contact night-coverage at [email or web address], password [facility]

## 2016-07-04 NOTE — Progress Notes (Signed)
Physical Therapy Treatment Patient Details Name: Debbie [name] MRN: [id number] DOB: [birth year] Today's Date: 07/04/2016    History of Present Illness Pt is an 83 y/o female admitted from a SNF secondary to dyspnea and hyperkalemia. 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    PT Comments    Dizziness limited today's session.  Attempted to sit EOB and let dizziness moderate, but upon standing it worsened.  Pt deferred ambulation in favor of transfer to chair.  Follow Up Recommendations  Home health PT;Supervision/Assistance - 24 hour     Equipment Recommendations  3in1 (PT)    Recommendations for Other Services       Precautions / Restrictions Precautions Precautions: Fall    Mobility  Bed Mobility Overal bed mobility: Needs Assistance Bed Mobility: Supine to Sit     Supine to sit: Min assist     General bed mobility comments: increased time, use of rails and minimal assist.  Up via R elbow.  Pt doing well to assist  Transfers Overall transfer level: Needs assistance Equipment used: Rolling walker (2 wheeled) Transfers: Sit to/from Stand Sit to Stand: Min assist         General transfer comment: cues for hand placement,  needed assist to come forward and up.  Ambulation/Gait             General Gait Details: deferred per pt due to increasing dizziness.   Stairs            Wheelchair Mobility    Modified Rankin (Stroke Patients Only)       Balance Overall balance assessment: Needs assistance Sitting-balance support: Feet supported Sitting balance-Leahy Scale: Fair     Standing balance support: Bilateral upper extremity supported Standing balance-Leahy Scale: Poor Standing balance comment: reliant on the RW                    Cognition Arousal/Alertness: Awake/alert Behavior During  Therapy: WFL for tasks assessed/performed Overall Cognitive Status: Within Functional Limits for tasks assessed                      Exercises General Exercises - Lower Extremity Ankle Circles/Pumps: AROM;Both;20 reps;Supine Quad Sets: AROM;Strengthening;Both;10 reps;Supine Heel Slides: AROM;Strengthening;Both;10 reps;Supine (graded resistance) Hip ABduction/ADduction: AAROM;Strengthening;Both;10 reps;Supine Straight Leg Raises: AROM;AAROM;Both;10 reps;Supine Other Exercises Other Exercises: bridging x10 reps.    General Comments General comments (skin integrity, edema, etc.): Sats on 3 L  running in the 88 to 92% range with a long time at 89%. HR in the 80's      Pertinent Vitals/Pain Pain Assessment: Faces Faces Pain Scale: Hurts a little bit Pain Location: legs Pain Descriptors / Indicators: Grimacing;Discomfort Pain Intervention(s): Monitored during session    Home Living                      Prior Function            PT Goals (current goals can now be found in the care plan section) Acute Rehab PT Goals Patient Stated Goal: none stated PT Goal Formulation: With patient/family Time For Goal Achievement: 07/12/16 Potential to Achieve Goals: Fair Progress towards PT goals: Progressing toward goals    Frequency    Min 3X/week      PT Plan Current plan remains appropriate    Co-evaluation  End of Session Equipment Utilized During Treatment: Oxygen Activity Tolerance: Patient tolerated treatment well;Patient limited by fatigue Patient left: in chair;with call bell/phone within reach;with chair alarm set     Time: 1550-1615 PT Time Calculation (min) (ACUTE ONLY): 25 min  Charges:  $Therapeutic Exercise: 8-22 mins $Therapeutic Activity: 8-22 mins                    G CodesTessie Fass Leone Mobley 07/04/2016, 5:27 PM 07/04/2016  Donnella Sham, [place] [phone]  (pager)

## 2016-07-04 NOTE — Progress Notes (Signed)
[facility] Progress Note    Subjective:    Excellent UOP past 24 hours (2.2 liters recorded) on Lasix 160 IV BID Last HD was 1/22 Creatinine still has not reached plateau  Still with cough and DOE   Objective:   BP (!) 150/55 (BP Location: Right Arm)   Pulse 68   Temp 98.1 F (36.7 C) (Oral)   Resp 16   Ht 5\' 5"  (1.651 m)   Wt 84.3 kg (185 lb 13.6 oz)   SpO2 95%   BMI 30.93 kg/m   Intake/Output Summary (Last 24 hours) at 07/04/16 1409 Last data filed at 07/04/16 1014  Gross per 24 hour  Intake              360 ml  Output             3070 ml  Net            -2710 ml   Weight change: 0.1 kg (3.5 oz)  Physical Exam: Very nice elderly woman, NAD Junky cough 6 cm JVP HR 70's, irregular Breath sounds diminished with mild end exp wheeze S1S2 No S3 Abd soft and non-tender 1+ edema LE's - mostly post thighs, improved pretib and ankle but still 1-2+ there as well R IJ Trialysis cath in place - non-occlusive dressing  Recent Labs Lab 06/28/16 1218 06/29/16 0417 06/30/16 0408 07/01/16 0437 07/02/16 0404 07/03/16 0415 07/04/16 0435  NA 136 137 139 139 140 140 140  K 4.9 4.8 4.9 4.4 4.2 4.7 4.5  CL 104 102 104 102 100* 101 100*  CO2 24 26 28 29 30 30 30   GLUCOSE 105* 71 83 97 87 93 78  BUN 27* 32* 37* 21* 26* 30* 35*  CREATININE 2.95* 3.52* 4.04* 2.92* 3.58* 3.96* 4.32*  CALCIUM 8.4* 8.3* 8.2* 8.2* 8.3* 8.5* 8.6*  PHOS 3.9 4.6 5.6* 4.2 4.4 4.9* 5.3*     Recent Labs Lab 06/30/16 1128 07/02/16 0404 07/03/16 0415 07/04/16 0435  WBC 8.3 7.6 7.9 7.5  HGB 7.8* 7.4* 7.6* 7.6*  HCT 25.1* 23.8* 24.8* 24.7*  MCV 96.2 96.4 96.5 96.9  PLT 158 155 227 245   Iron/TIBC/Ferritin/ %Sat    Component Value Date/Time   IRON 24 (L) 06/30/2016 1128   TIBC 248 (L) 06/30/2016 1128   FERRITIN 53 06/30/2016 1128   IRONPCTSAT 10 (L) 06/30/2016 1128   Medications:    . arformoterol  15 mcg Nebulization BID  . aspirin EC  81 mg Oral Daily  . atorvastatin   20 mg Oral q1800  . budesonide (PULMICORT) nebulizer solution  0.5 mg Nebulization BID  . darbepoetin (ARANESP) injection - NON-DIALYSIS  150 mcg Subcutaneous Q Wed-1800  . furosemide  160 mg Intravenous BID  . heparin  5,000 Units Subcutaneous Q8H  . insulin aspart  0-9 Units Subcutaneous TID WC  . multivitamin with minerals  1 tablet Oral Daily  . sodium chloride  500 mL Intravenous Once  . sodium chloride flush  10-40 mL Intracatheter Q12H    Dg Chest 2 View  Result Date: 07/03/2016 CLINICAL DATA:  Cough, congestion, history of CHF EXAM: CHEST  2 VIEW COMPARISON:  Chest x-ray of 06/28/2016 FINDINGS: There is cardiomegaly present with bilateral pleural effusions of moderate size and pulmonary vascular congestion, findings consistent with congestive heart failure. Right central venous line tip overlies the mid SVC. Moderate thoracic aortic atherosclerotic change is present. No acute bony abnormality is seen. There are degenerative changes throughout the thoracic  spine. IMPRESSION: 1. CHF with moderate-sized pleural effusions. 2. Moderate thoracic aortic atherosclerosis. Electronically Signed   By: Ivar Drape M.D.   On: 07/03/2016 11:54   Background: 37F with AoCKD3 and life threatening hyperkalemia not responsive to Med Rx, 2/2 diuretics + ACE, profound bradycardia. Renal consulted 1/17, [facility], had CRRT 1/17-1/19, IHD on 1/22. Baseline creatinine prior to this episode AKI 2.1-2.2   Assessment/ Plan:    1. AoCKD3, likely 2/2 diuretics + ACEi + documented hypotension (She also had AKI  06/2016  hospitalization and was discharged with a Cr of 2.1; HCTZ/ lisinopril held on discharge but resumed as an outpatient).  On current admission, pt initially required CRRT 1/17-1/19 for vol overload/ hypotension/hyperkalemia, Trial of high-dose Lasix produced some urine output but creatinine was still rising - had HD 1/22 for volume removal. Making excellent urine with lasix, but creatinine  has not reached plateau, and CXR yesterday still with significant excess volume. Plan for HD in AM primarily for volume. Keep same lasix.  2. Hyperkalemia 2/2 #1 and ACEi use- resolved. NO MORE ACE FOR HER  3. Anemia - Hb down to 7.4. TSat very low. Got small dose of infed in dialysis 1/22. I'm not a fan of infed and only got small dose. Dosed Feraheme 510 on 1/24 started Aranesp 150/week 1/24  4. Bradycardia, SVR in setting of AFib on BB- bradycardia improved, off BB 5. Volume overload improved with HD 1/22 with 1.5 L off . Making good urine with IV lasix but CXR still wet.  6. Acute hypoxic respiratory failure: COPD + recent adm for HCAP/influenza (06/2016). Stable O2 requirement.  7. Recent HCAP and +flu, pulm congestion and effusions (discharged 06/13/16 from [facility]).  Finished a course of Tamiflu and Levaquin.   8. COPD: on Brovana/pulmicort at this time.  9. HTN-Off BP meds and none needed 10. Dm2- per primary   Jamal Maes, MD [facility] [phone] Pager 07/04/2016, 2:09 PM

## 2016-07-04 NOTE — Progress Notes (Signed)
07/04/16: The position of the IJ is compromising the integrity of the dressing. This patient is at an increased risk for CLASBI if the dressing does not remain intact.Pleae consider moving to the subclavian or rotating the ports downward. Thanks C.Solicitor Acupuncturist)

## 2016-07-05 LAB — RENAL FUNCTION PANEL
ALBUMIN: 2.2 g/dL — AB (ref 3.5–5.0)
ANION GAP: 10 (ref 5–15)
BUN: 36 mg/dL — AB (ref 6–20)
CALCIUM: 8.6 mg/dL — AB (ref 8.9–10.3)
CO2: 32 mmol/L (ref 22–32)
Chloride: 99 mmol/L — ABNORMAL LOW (ref 101–111)
Creatinine, Ser: 4.52 mg/dL — ABNORMAL HIGH (ref 0.44–1.00)
GFR calc Af Amer: 9 mL/min — ABNORMAL LOW (ref >60)
GFR calc non Af Amer: 8 mL/min — ABNORMAL LOW (ref >60)
GLUCOSE: 89 mg/dL (ref 65–99)
POTASSIUM: 4.2 mmol/L (ref 3.5–5.1)
Phosphorus: 5 mg/dL — ABNORMAL HIGH (ref 2.5–4.6)
SODIUM: 141 mmol/L (ref 135–145)

## 2016-07-05 LAB — GLUCOSE, CAPILLARY
GLUCOSE-CAPILLARY: 142 mg/dL — AB (ref 65–99)
Glucose-Capillary: 90 mg/dL (ref 65–99)
Glucose-Capillary: 116 mg/dL — ABNORMAL HIGH (ref 65–99)
Glucose-Capillary: 94 mg/dL (ref 65–99)

## 2016-07-05 MED ORDER — NEPRO/CARBSTEADY PO LIQD
237.0000 mL | Freq: Three times a day (TID) | ORAL | Status: DC
  Administered 2016-07-05 – 2016-07-19 (×28): 237 mL via ORAL

## 2016-07-05 NOTE — Progress Notes (Signed)
[facility] Progress Note    Subjective:    Getting HD today primarily for volume, thought notably creatinine continued to rise in interdialytic interval witout reaching plateau but rate of rise clearly slower Continues to have good UOP Have continued lasix 160 BID - 2.1 liters out yesterday   Objective:   BP (!) 114/50   Pulse 65   Temp 97.5 F (36.4 C) (Oral)   Resp 19   Ht 5\' 5"  (1.651 m)   Wt 79.4 kg (175 lb 0.7 oz)   SpO2 100%   BMI 29.13 kg/m   Intake/Output Summary (Last 24 hours) at 07/05/16 1559 Last data filed at 07/05/16 0600  Gross per 24 hour  Intake              240 ml  Output              850 ml  Net             -610 ml   Weight change: 0.9 kg (1 lb 15.8 oz)  Physical Exam: Very nice elderly woman, NAD Cough but seems less congested 6 cm JVP HR 70's, irregular Breath sounds diminished  No wheezes today S1S2 No S3 Abd soft and non-tender 1+ edema LE's - mostly post thighs, improved pretib and ankle but still 1-2+ there as well R IJ Trialysis cath in place - in use  Recent Labs Lab 06/29/16 0417 06/30/16 0408 07/01/16 0437 07/02/16 0404 07/03/16 0415 07/04/16 0435 07/05/16 0407  NA 137 139 139 140 140 140 141  K 4.8 4.9 4.4 4.2 4.7 4.5 4.2  CL 102 104 102 100* 101 100* 99*  CO2 26 28 29 30 30 30  32  GLUCOSE 71 83 97 87 93 78 89  BUN 32* 37* 21* 26* 30* 35* 36*  CREATININE 3.52* 4.04* 2.92* 3.58* 3.96* 4.32* 4.52*  CALCIUM 8.3* 8.2* 8.2* 8.3* 8.5* 8.6* 8.6*  PHOS 4.6 5.6* 4.2 4.4 4.9* 5.3* 5.0*     Recent Labs Lab 06/30/16 1128 07/02/16 0404 07/03/16 0415 07/04/16 0435  WBC 8.3 7.6 7.9 7.5  HGB 7.8* 7.4* 7.6* 7.6*  HCT 25.1* 23.8* 24.8* 24.7*  MCV 96.2 96.4 96.5 96.9  PLT 158 155 227 245   Iron/TIBC/Ferritin/ %Sat    Component Value Date/Time   IRON 24 (L) 06/30/2016 1128   TIBC 248 (L) 06/30/2016 1128   FERRITIN 53 06/30/2016 1128   IRONPCTSAT 10 (L) 06/30/2016 1128   Medications:    . arformoterol  15  mcg Nebulization BID  . aspirin EC  81 mg Oral Daily  . atorvastatin  20 mg Oral q1800  . budesonide (PULMICORT) nebulizer solution  0.5 mg Nebulization BID  . darbepoetin (ARANESP) injection - NON-DIALYSIS  150 mcg Subcutaneous Q Wed-1800  . feeding supplement (NEPRO CARB STEADY)  237 mL Oral TID BM  . furosemide  160 mg Intravenous BID  . heparin  5,000 Units Subcutaneous Q8H  . insulin aspart  0-9 Units Subcutaneous TID WC  . multivitamin with minerals  1 tablet Oral Daily  . sodium chloride  500 mL Intravenous Once  . sodium chloride flush  10-40 mL Intracatheter Q12H    Dg Chest 2 View  Result Date: 07/03/2016 CLINICAL DATA:  Cough, congestion, history of CHF EXAM: CHEST  2 VIEW COMPARISON:  Chest x-ray of 06/28/2016 FINDINGS: There is cardiomegaly present with bilateral pleural effusions of moderate size and pulmonary vascular congestion, findings consistent with congestive heart failure. Right central venous line  tip overlies the mid SVC. Moderate thoracic aortic atherosclerotic change is present. No acute bony abnormality is seen. There are degenerative changes throughout the thoracic spine. IMPRESSION: 1. CHF with moderate-sized pleural effusions. 2. Moderate thoracic aortic atherosclerosis. Electronically Signed   By: Ivar Drape M.D.   On: 07/03/2016 11:54   Background: 20F with AoCKD3 and life threatening hyperkalemia not responsive to Med Rx, 2/2 diuretics + ACE, profound bradycardia. Renal consulted 1/17, pt transferred from [facility], had CRRT 1/17-1/19, IHD on 1/22. Baseline creatinine prior to this episode AKI 2.1-2.2  Assessment/ Plan:    1. AoCKD3, likely 2/2 diuretics + ACEi + documented hypotension (She also had AKI  06/2016  hospitalization and was discharged with a Cr of 2.1; HCTZ/ lisinopril held on discharge but resumed as an outpatient).  On current admission, pt initially required CRRT 1/17-1/19 for vol overload/ hypotension/hyperkalemia, Trial of high-dose Lasix produced  some urine output but creatinine was still rising - had HD 1/22 for volume removal. Currently making excellent urine with lasix, creatinine has not reached plateau (though rate of rise clearly slowing), and CXR 1/25 still with significant excess volume. Getting HD today primarily for volume.  Keep same lasix.  2. Hyperkalemia 2/2 #1 and ACEi use- resolved. NO MORE ACE FOR HER  3. Anemia - Hb nadir 7.4. TSat 10%. . Got small dose of infed in dialysis 1/22. Dosed Feraheme 510 on 1/24 and started Aranesp 150/week on  1/24  4. Bradycardia, SVR in setting of AFib on BB- bradycardia improved, off BB 5. Volume overload improved with HD 1/22 with 1.5 L off . Making good urine with IV lasix but CXR still wet.  6. Acute hypoxic respiratory failure: COPD + recent adm for HCAP/influenza (06/2016). Stable O2 requirement.  7. Recent HCAP and +flu, pulm congestion and effusions (discharged 06/13/16 from [facility]).  Finished a course of Tamiflu and Levaquin.   8. COPD: on Brovana/pulmicort at this time.  9. HTN-Off BP meds and none needed 10. Dm2- per primary   Debbie Maes, MD [facility] [phone] Pager 07/05/2016, 3:59 PM

## 2016-07-05 NOTE — Procedures (Signed)
I have personally attended this patient's dialysis session.   Temp cath running 300-35 UF goal is 3 liters 2K bath  Jamal Maes, MD [facility] [phone] Pager 07/05/2016, 4:06 PM

## 2016-07-05 NOTE — Progress Notes (Signed)
[facility] PROGRESS NOTE  Debbie [name] [id number] DOB: 1930-07-10 DOA: 06/25/2016  PCP: Myriam Jacobson, MD  Brief History/Interval Summary: 83 year old female with a past medical history of COPD, oxygen dependent, diastolic CHF, diabetes, chronic kidney disease, presented with shortness of breath and was found to have low oxygen levels and low blood pressure. She was found to have acute on chronic kidney disease. Initially admitted to step down then had to be transferred to ICU due to hypotension. Started on hemodialysis. She was stabilized and then transferred to the floor.  Reason for Visit: Acute renal failure on chronic kidney disease  Consultants: Nephrology  Procedures: Intermittent Hemodialysis  Antibiotics: None  Subjective/Interval History: Patient feels well. Denies any shortness of breath. No chest pain.   ROS: Denies any nausea or vomiting  Objective:  Vital Signs  Vitals:   07/04/16 2149 07/05/16 0547 07/05/16 0905 07/05/16 0917  BP:  (!) 130/50 (!) 105/48   Pulse:  68 62   Resp:  18 18   Temp:  98.1 F (36.7 C) 98.2 F (36.8 C)   TempSrc:  Oral Oral   SpO2: 99% 98% 98% 92%  Weight:      Height:        Intake/Output Summary (Last 24 hours) at 07/05/16 1019 Last data filed at 07/05/16 0600  Gross per 24 hour  Intake              480 ml  Output             1300 ml  Net             -820 ml   Filed Weights   07/02/16 2039 07/03/16 2032 07/04/16 2056  Weight: 84.2 kg (185 lb 10 oz) 84.3 kg (185 lb 13.6 oz) 85.2 kg (187 lb 13.3 oz)    General appearance: alert, cooperative, appears stated age and no distress Resp: Diminished air entry at the bases without any wheezing, rhonchi. Dullness to percussion. Cardio: regular rate and rhythm, S1, S2 normal, no murmur, click, rub or gallop GI: soft, non-tender; bowel sounds normal; no masses,  no organomegaly Extremities: Minimal edema bilateral lower extremities Neurologic: No focal  deficits  Lab Results:  Data Reviewed: I have personally reviewed following labs and imaging studies  CBC:  Recent Labs Lab 06/29/16 0417 06/30/16 1128 07/02/16 0404 07/03/16 0415 07/04/16 0435  WBC 8.5 8.3 7.6 7.9 7.5  HGB 7.8* 7.8* 7.4* 7.6* 7.6*  HCT 24.7* 25.1* 23.8* 24.8* 24.7*  MCV 95.4 96.2 96.4 96.5 96.9  PLT 138* 158 155 227 [id number]    Basic Metabolic Panel:  Recent Labs Lab 07/01/16 0437 07/02/16 0404 07/03/16 0415 07/04/16 0435 07/05/16 0407  NA 139 140 140 140 141  K 4.4 4.2 4.7 4.5 4.2  CL 102 100* 101 100* 99*  CO2 29 30 30 30  32  GLUCOSE 97 87 93 78 89  BUN 21* 26* 30* 35* 36*  CREATININE 2.92* 3.58* 3.96* 4.32* 4.52*  CALCIUM 8.2* 8.3* 8.5* 8.6* 8.6*  PHOS 4.2 4.4 4.9* 5.3* 5.0*    GFR: Estimated Creatinine Clearance: 10 mL/min (by C-G formula based on SCr of 4.52 mg/dL (H)).  Liver Function Tests:  Recent Labs Lab 07/01/16 0437 07/02/16 0404 07/03/16 0415 07/04/16 0435 07/05/16 0407  ALBUMIN 2.3* 2.3* 2.3* 2.3* 2.2*    CBG:  Recent Labs Lab 07/04/16 0813 07/04/16 1137 07/04/16 1617 07/04/16 2053 07/05/16 0821  GLUCAP 90 105* 120* 107* 90    Radiology Studies:  Dg Chest 2 View  Result Date: 07/03/2016 CLINICAL DATA:  Cough, congestion, history of CHF EXAM: CHEST  2 VIEW COMPARISON:  Chest x-ray of 06/28/2016 FINDINGS: There is cardiomegaly present with bilateral pleural effusions of moderate size and pulmonary vascular congestion, findings consistent with congestive heart failure. Right central venous line tip overlies the mid SVC. Moderate thoracic aortic atherosclerotic change is present. No acute bony abnormality is seen. There are degenerative changes throughout the thoracic spine. IMPRESSION: 1. CHF with moderate-sized pleural effusions. 2. Moderate thoracic aortic atherosclerosis. Electronically Signed   By: Ivar Drape M.D.   On: 07/03/2016 11:54     Medications:  Scheduled: . arformoterol  15 mcg Nebulization BID  .  aspirin EC  81 mg Oral Daily  . atorvastatin  20 mg Oral q1800  . budesonide (PULMICORT) nebulizer solution  0.5 mg Nebulization BID  . darbepoetin (ARANESP) injection - NON-DIALYSIS  150 mcg Subcutaneous Q Wed-1800  . feeding supplement (NEPRO CARB STEADY)  237 mL Oral TID BM  . furosemide  160 mg Intravenous BID  . heparin  5,000 Units Subcutaneous Q8H  . insulin aspart  0-9 Units Subcutaneous TID WC  . multivitamin with minerals  1 tablet Oral Daily  . sodium chloride  500 mL Intravenous Once  . sodium chloride flush  10-40 mL Intracatheter Q12H   Continuous:  [id number] chloride, sodium chloride, acetaminophen **OR** acetaminophen, albuterol, alteplase, heparin, lidocaine (PF), lidocaine-prilocaine, ondansetron **OR** ondansetron (ZOFRAN) IV, pentafluoroprop-tetrafluoroeth, sodium chloride, sodium chloride flush  Assessment/Plan:  Active Problems:   Renal failure   AKI (acute kidney injury) ([place])   Hyperkalemia   Atrial fibrillation (HCC)   Bradycardia   Central venous catheter in place    Acute kidney injury on chronic kidney disease, stage 3 Creatinine 7 on admission with a known baseline of 1.5. Patient was initially admitted to ICU. S/p CRRT while in the ICU. Nephrology is following. Now on intermittent hemodialysis. On high-dose Lasix. Has good urine output. There appears to be a plan to dialyze the patient today considering pleural effusion noted on chest x-ray. Defer management to nephrology.  Acute hypoxic resp failure Continues to need oxygen, which apparently she uses at home as well. Volume being managed with diuretics and dialysis.  Chronic Diastolic heart failure This is based off of an echocardiogram from December. Previously on torsemide. Now volume is mainly being addressed with dialysis and high-dose Lasix.  Episodic bradycardia Thought to be secondary to Hyperkalemia versus beta blocker. Resolved.  Recent H flu +/-HCAP Stable  History of COPD    Stable. Apparently on home oxygen. Continue home medications. No wheezing noted. Continue current medications.  Anemia of chronic disease Hemoglobin is low but stable. No evidence for overt bleeding. Low iron and ferritin levels noted. Parenteral Iron was given.  History of Endometrial adenocarcinoma status post XRT Stable   DVT Prophylaxis: Subcutaneous heparin    Code Status: Full code  Family Communication: Discussed with the patient and her son today Disposition Plan: Mobilize. Patient wishes to go home instead of skilled nursing facility when ready for discharge. Discharge when cleared by nephrology.   LOS: 10 days   [place] [facility] Pager [phone] 07/05/2016, 10:19 AM  If 7PM-7AM, please contact night-coverage at [email or web address], password [facility]

## 2016-07-05 NOTE — Progress Notes (Addendum)
Initial Nutrition Assessment  DOCUMENTATION CODES:   Obesity unspecified  INTERVENTION:  -Discussed smaller, more frequent meals; snacks with protein ordered between meals. -Continue Nepro BID for now; if pt does not like, recommend switching to [facility] -If appetite remains poor, consider liberalizing diet to Regular to promote po intake (CBGs 120 or less at present)  NUTRITION DIAGNOSIS:   Inadequate oral intake related to acute illness, poor appetite as evidenced by per patient/family report, meal completion < 50%.  GOAL:   Patient will meet greater than or equal to 90% of their needs  MONITOR:   PO intake, Supplement acceptance, Labs, Weight trends  REASON FOR ASSESSMENT:   Consult Poor PO  ASSESSMENT:    83 yo female admitted with dypsnea and hyperkalemia with volue overload; pt with acute on CKD III. Pt with additional hx of DM, COPD, HTN, endometrial and vaginal cancer, PVD  Required CRRT 1/17-1/19, transitioned to intermittent HD with Last HD 1/22, pt receiving HD today UOP 2.2 L in past 24 hours Hyperkalemia resolved  Pt reports appetite has been poor since admission to the hospital. Pt reports she ate a cheese sandwich and tomato soup at lunch today. Pt did not really eat breakfast. Pt ate 60% of meals yesterday, 40% of meals the day before; Pt with order for Nepro but is unsure if she has tried it. Reinforced importance of adequate protein while on dialysis. Nausea at times, not associated with eating. No vomiting Prior to this pt reports good appetite. Pt reports no significant wt loss. Noted weight down today but not lower than weight in November of 2017  Nutrition-Focused physical exam completed. Findings are no fat depletion, no muscle depletion, and mild edema.   Labs:potassium 4.2 (wdl), Creatinine 4.52, phosphorus 5.0, CBGs 90-120s Meds: MVI, ss novolog  Diet Order:  Diet Carb Modified Fluid consistency: Thin; Room service appropriate? Yes; Fluid  restriction: 1200 mL Fluid  Skin:  Wound (see comment) (stage I coccyx)  Last BM:  07/02/16  Height:   Ht Readings from Last 1 Encounters:  06/27/16 5\' 5"  (1.651 m)    Weight:   Wt Readings from Last 1 Encounters:  07/05/16 175 lb 0.7 oz (79.4 kg)   Filed Weights   07/03/16 2032 07/04/16 2056 07/05/16 1345  Weight: 185 lb 13.6 oz (84.3 kg) 187 lb 13.3 oz (85.2 kg) 175 lb 0.7 oz (79.4 kg)     BMI:  Body mass index is 29.13 kg/m.  Estimated Nutritional Needs:   Kcal:  1700-2000 kcals  Protein:  94-117 g   Fluid:  1000 mL plus UOP  EDUCATION NEEDS:   Education needs addressed  Kerman Passey MS, [place], LDN [phone] Pager  [phone] Weekend/On-Call Pager

## 2016-07-06 LAB — RENAL FUNCTION PANEL
ANION GAP: 9 (ref 5–15)
Albumin: 2.3 g/dL — ABNORMAL LOW (ref 3.5–5.0)
BUN: 15 mg/dL (ref 6–20)
CHLORIDE: 101 mmol/L (ref 101–111)
CO2: 30 mmol/L (ref 22–32)
Calcium: 8.4 mg/dL — ABNORMAL LOW (ref 8.9–10.3)
Creatinine, Ser: 2.54 mg/dL — ABNORMAL HIGH (ref 0.44–1.00)
GFR, EST AFRICAN AMERICAN: 19 mL/min — AB (ref >60)
GFR, EST NON AFRICAN AMERICAN: 16 mL/min — AB (ref >60)
Glucose, Bld: 82 mg/dL (ref 65–99)
POTASSIUM: 3.6 mmol/L (ref 3.5–5.1)
Phosphorus: 2.9 mg/dL (ref 2.5–4.6)
Sodium: 140 mmol/L (ref 135–145)

## 2016-07-06 LAB — GLUCOSE, CAPILLARY
GLUCOSE-CAPILLARY: 81 mg/dL (ref 65–99)
GLUCOSE-CAPILLARY: 183 mg/dL — AB (ref 65–99)
GLUCOSE-CAPILLARY: 101 mg/dL — AB (ref 65–99)
Glucose-Capillary: 117 mg/dL — ABNORMAL HIGH (ref 65–99)

## 2016-07-06 NOTE — Progress Notes (Signed)
[name] Progress Note    Subjective:   Had HD yesterday for volume overload 3 liters removed Continues to have good UOP (with lasix) Still with a LOT of edema    Objective:   BP (!) 82/50   Pulse (!) 57   Temp 97.8 F (36.6 C) (Oral)   Resp 18   Ht 5\' 5"  (1.651 m)   Wt 77.1 kg (169 lb 15.6 oz)   SpO2 94%   BMI 28.29 kg/m   Intake/Output Summary (Last 24 hours) at 07/06/16 1323 Last data filed at 07/06/16 0900  Gross per 24 hour  Intake              240 ml  Output             4050 ml  Net            -3810 ml   Weight change: -5.8 kg (-12 lb 12.6 oz)  Physical Exam: Very nice elderly woman, NAD Cough but seems less congested 6 cm JVP HR 70's, irregular Breath sounds diminished markedly on the left, more so than the right No wheezes today S1S2 No S3 Abd soft and non-tender 1+ edema sacral, lat abd wall 1+ LE edema  - mostly post thighs, improved pretib and ankle but still 1-2+ there as well R IJ Trialysis cath in place  Recent Labs Lab 06/30/16 0408 07/01/16 0437 07/02/16 0404 07/03/16 0415 07/04/16 0435 07/05/16 0407 07/06/16 0508  NA 139 139 140 140 140 141 140  K 4.9 4.4 4.2 4.7 4.5 4.2 3.6  CL 104 102 100* 101 100* 99* 101  CO2 28 29 30 30 30  32 30  GLUCOSE 83 97 87 93 78 89 82  BUN 37* 21* 26* 30* 35* 36* 15  CREATININE 4.04* 2.92* 3.58* 3.96* 4.32* 4.52* 2.54*  CALCIUM 8.2* 8.2* 8.3* 8.5* 8.6* 8.6* 8.4*  PHOS 5.6* 4.2 4.4 4.9* 5.3* 5.0* 2.9     Recent Labs Lab 06/30/16 1128 07/02/16 0404 07/03/16 0415 07/04/16 0435  WBC 8.3 7.6 7.9 7.5  HGB 7.8* 7.4* 7.6* 7.6*  HCT 25.1* 23.8* 24.8* 24.7*  MCV 96.2 96.4 96.5 96.9  PLT 158 155 227 245   Iron/TIBC/Ferritin/ %Sat    Component Value Date/Time   IRON 24 (L) 06/30/2016 1128   TIBC 248 (L) 06/30/2016 1128   FERRITIN 53 06/30/2016 1128   IRONPCTSAT 10 (L) 06/30/2016 1128   Medications:    . arformoterol  15 mcg Nebulization BID  . aspirin EC  81 mg Oral Daily  .  atorvastatin  20 mg Oral q1800  . budesonide (PULMICORT) nebulizer solution  0.5 mg Nebulization BID  . darbepoetin (ARANESP) injection - NON-DIALYSIS  150 mcg Subcutaneous Q Wed-1800  . feeding supplement (NEPRO CARB STEADY)  237 mL Oral TID BM  . furosemide  160 mg Intravenous BID  . heparin  5,000 Units Subcutaneous Q8H  . insulin aspart  0-9 Units Subcutaneous TID WC  . multivitamin with minerals  1 tablet Oral Daily  . sodium chloride  500 mL Intravenous Once  . sodium chloride flush  10-40 mL Intracatheter Q12H    Dg Chest 2 View  Result Date: 07/03/2016 CLINICAL DATA:  Cough, congestion, history of CHF EXAM: CHEST  2 VIEW COMPARISON:  Chest x-ray of 06/28/2016 FINDINGS: There is cardiomegaly present with bilateral pleural effusions of moderate size and pulmonary vascular congestion, findings consistent with congestive heart failure. Right central venous line tip overlies the mid SVC. Moderate  thoracic aortic atherosclerotic change is present. No acute bony abnormality is seen. There are degenerative changes throughout the thoracic spine. IMPRESSION: 1. CHF with moderate-sized pleural effusions. 2. Moderate thoracic aortic atherosclerosis. Electronically Signed   By: Ivar Drape M.D.   On: 07/03/2016 11:54   Background: 3F with AoCKD3 BL 2.1-2.2 mid Dec) and life threatening hyperkalemia (6.8)  not responsive to Med Rx, 2/2 diuretics + ACE, profound bradycardia. Renal consulted 1/17, pt transferred from [facility], had CRRT 1/17-1/19, IHD on 1/22. Baseline creatinine prior to this episode AKI 2.1-2.2. Last HD 1/27.   Assessment/ Plan:    1. AoCKD3, likely 2/2 diuretics + ACEi + documented hypotension (Also had AKI Jan /2018  hospitalization and was discharged with a Cr of 2.1; HCTZ/ lisinopril held on discharge but resumed as an outpatient).  On current admission, pt initially required CRRT 1/17-1/19 for vol overload/ hypotension/hyperkalemia, Trial of high-dose Lasix produced some urine output  but creatinine was still rising - had HD 1/22 for volume removal. In interim madeexcellent urine with lasix, but creatinine continued to rise, CXR 1/25 still with significant excess volume. HD 1/27 primarily for volume 3 liters off, still making urine with lasix. Weight down about 10 kg since admission overall but still with volume to go. Follow creatinine and UOP reasses further HD needs in AM. Pt and son aware that more dialysis will likely be needed. 2. Bilateral pleural effusions - by exam large n the left; bilateral on CXR. Consider thoracentesis.   3. Hyperkalemia 2/2 #1 and ACEi use- resolved. NO MORE ACE EVER FOR HER  4. Anemia - Hb nadir 7.4. TSat 10%. Dosed Feraheme 510 on 1/24 and started Aranesp 150/week on 1/24  5. Bradycardia, SVR in setting of AFib on BB- bradycardia improved, off BB 6. Acute hypoxic respiratory failure: COPD + recent adm for HCAP/influenza (06/2016). Stable O2 requirement.  7. Jan 2018 HCAP and +flu, pulm congestion and effusions (discharged 06/13/16 from [facility]).  Finished a course of Tamiflu and Levaquin.   8. COPD: on Brovana/pulmicort at this time.  9. HTN-Off BP meds and none needed 10. Dm2- per primary   Jamal Maes, MD [facility] [phone] Pager 07/06/2016, 1:23 PM

## 2016-07-06 NOTE — Progress Notes (Signed)
[facility] PROGRESS NOTE  Debbie [name] [id number] DOB: 1930/07/26 DOA: 06/25/2016  PCP: Myriam Jacobson, MD  Brief History/Interval Summary: 83 year old female with a past medical history of COPD, oxygen dependent, diastolic CHF, diabetes, chronic kidney disease, presented with shortness of breath and was found to have low oxygen levels and low blood pressure. She was found to have acute on chronic kidney disease. Initially admitted to step down then had to be transferred to ICU due to hypotension. Started on hemodialysis. She was stabilized and then transferred to the floor. Patient started on high-dose Lasix.  Reason for Visit: Acute renal failure on chronic kidney disease  Consultants: Nephrology  Procedures: Intermittent Hemodialysis. Last dialysis on 1/27  Antibiotics: None  Subjective/Interval History: Patient states that she is breathing better after her dialysis yesterday. She denies any chest pain.   ROS: Denies any nausea or vomiting  Objective:  Vital Signs  Vitals:   07/05/16 1726 07/05/16 2152 07/05/16 2154 07/06/16 0509  BP: (!) 122/57  (!) 98/45 (!) 122/43  Pulse: 76  65 69  Resp: 10  17 16   Temp: 97.6 F (36.4 C)  98.7 F (37.1 C) 98.1 F (36.7 C)  TempSrc: Oral  Oral Oral  SpO2: 100% 100% 100% 99%  Weight: 76.4 kg (168 lb 6.9 oz)  77.1 kg (169 lb 15.6 oz)   Height:        Intake/Output Summary (Last 24 hours) at 07/06/16 [id number] Last data filed at 07/06/16 0600  Gross per 24 hour  Intake                0 ml  Output             4050 ml  Net            -4050 ml   Filed Weights   07/05/16 1345 07/05/16 1726 07/05/16 2154  Weight: 79.4 kg (175 lb 0.7 oz) 76.4 kg (168 lb 6.9 oz) 77.1 kg (169 lb 15.6 oz)    General appearance: alert, cooperative, appears stated age and no distress Resp: Somewhat improved air entry bilaterally but diminished at the bases.  Cardio: regular rate and rhythm, S1, S2 normal, no murmur, click, rub or  gallop GI: soft, non-tender; bowel sounds normal; no masses,  no organomegaly Extremities: Minimal edema bilateral lower extremities Neurologic: No focal deficits  Lab Results:  Data Reviewed: I have personally reviewed following labs and imaging studies  CBC:  Recent Labs Lab 06/30/16 1128 07/02/16 0404 07/03/16 0415 07/04/16 0435  WBC 8.3 7.6 7.9 7.5  HGB 7.8* 7.4* 7.6* 7.6*  HCT 25.1* 23.8* 24.8* 24.7*  MCV 96.2 96.4 96.5 96.9  PLT 158 155 227 [id number]    Basic Metabolic Panel:  Recent Labs Lab 07/02/16 0404 07/03/16 0415 07/04/16 0435 07/05/16 0407 07/06/16 0508  NA 140 140 140 141 140  K 4.2 4.7 4.5 4.2 3.6  CL 100* 101 100* 99* 101  CO2 30 30 30  32 30  GLUCOSE 87 93 78 89 82  BUN 26* 30* 35* 36* 15  CREATININE 3.58* 3.96* 4.32* 4.52* 2.54*  CALCIUM 8.3* 8.5* 8.6* 8.6* 8.4*  PHOS 4.4 4.9* 5.3* 5.0* 2.9    GFR: Estimated Creatinine Clearance: 16.9 mL/min (by C-G formula based on SCr of 2.54 mg/dL (H)).  Liver Function Tests:  Recent Labs Lab 07/02/16 0404 07/03/16 0415 07/04/16 0435 07/05/16 0407 07/06/16 0508  ALBUMIN 2.3* 2.3* 2.3* 2.2* 2.3*    CBG:  Recent Labs Lab 07/05/16 [phone]  07/05/16 1159 07/05/16 1809 07/05/16 2150 07/06/16 0755  GLUCAP 90 116* 94 142* 81    Radiology Studies: No results found.   Medications:  Scheduled: . arformoterol  15 mcg Nebulization BID  . aspirin EC  81 mg Oral Daily  . atorvastatin  20 mg Oral q1800  . budesonide (PULMICORT) nebulizer solution  0.5 mg Nebulization BID  . darbepoetin (ARANESP) injection - NON-DIALYSIS  150 mcg Subcutaneous Q Wed-1800  . feeding supplement (NEPRO CARB STEADY)  237 mL Oral TID BM  . furosemide  160 mg Intravenous BID  . heparin  5,000 Units Subcutaneous Q8H  . insulin aspart  0-9 Units Subcutaneous TID WC  . multivitamin with minerals  1 tablet Oral Daily  . sodium chloride  500 mL Intravenous Once  . sodium chloride flush  10-40 mL Intracatheter Q12H    Continuous:  [id number] chloride, sodium chloride, acetaminophen **OR** acetaminophen, albuterol, alteplase, heparin, lidocaine (PF), lidocaine-prilocaine, ondansetron **OR** ondansetron (ZOFRAN) IV, pentafluoroprop-tetrafluoroeth, sodium chloride, sodium chloride flush  Assessment/Plan:  Active Problems:   Renal failure   AKI (acute kidney injury) ([place])   Hyperkalemia   Atrial fibrillation (HCC)   Bradycardia   Central venous catheter in place    Acute kidney injury on chronic kidney disease, stage 3 Creatinine 7 on admission with a known baseline of 1.5. Patient was initially admitted to ICU. S/p CRRT while in the ICU. Nephrology is following. Now on intermittent hemodialysis, last dialyzed on 1/27. Pleural effusions noted on last chest x-ray. On high-dose Lasix.  Nephrology is managing.   Acute hypoxic resp failure Continues to need oxygen, which apparently she uses at home as well. Volume being managed with diuretics and dialysis.  Chronic Diastolic heart failure This is based off of an echocardiogram from December. Previously on torsemide. Now volume is mainly being addressed with dialysis and high-dose Lasix.  Episodic bradycardia Thought to be secondary to Hyperkalemia versus beta blocker. Resolved.  Recent H flu +/-HCAP Stable  History of COPD  Stable. Apparently on home oxygen. Continue home medications. No wheezing noted. Continue current medications.  Anemia of chronic disease Hemoglobin is low but stable. No evidence for overt bleeding. Low iron and ferritin levels noted. Parenteral Iron was given. Recheck level tomorrow.  History of Endometrial adenocarcinoma status post XRT Stable   DVT Prophylaxis: Subcutaneous heparin    Code Status: Full code  Family Communication: Discussed with the patient and her son today Disposition Plan: Mobilize. Patient wishes to go home instead of skilled nursing facility when ready for discharge. Discharge when cleared by  nephrology.   LOS: 11 days   [place] [facility] Pager [phone] 07/06/2016, 9:18 AM  If 7PM-7AM, please contact night-coverage at [email or web address], password [facility]

## 2016-07-07 ENCOUNTER — Inpatient Hospital Stay (HOSPITAL_COMMUNITY): Payer: Medicare HMO

## 2016-07-07 DIAGNOSIS — J90 Pleural effusion, not elsewhere classified: Secondary | ICD-10-CM

## 2016-07-07 LAB — RENAL FUNCTION PANEL
ANION GAP: 9 (ref 5–15)
Albumin: 2.3 g/dL — ABNORMAL LOW (ref 3.5–5.0)
BUN: 21 mg/dL — ABNORMAL HIGH (ref 6–20)
CALCIUM: 8.5 mg/dL — AB (ref 8.9–10.3)
CHLORIDE: 101 mmol/L (ref 101–111)
CO2: 31 mmol/L (ref 22–32)
CREATININE: 3.17 mg/dL — AB (ref 0.44–1.00)
GFR calc non Af Amer: 12 mL/min — ABNORMAL LOW (ref >60)
GFR, EST AFRICAN AMERICAN: 14 mL/min — AB (ref >60)
Glucose, Bld: 80 mg/dL (ref 65–99)
Phosphorus: 2.5 mg/dL (ref 2.5–4.6)
Potassium: 3.5 mmol/L (ref 3.5–5.1)
Sodium: 141 mmol/L (ref 135–145)

## 2016-07-07 LAB — CBC
HEMATOCRIT: 25.5 % — AB (ref 36.0–46.0)
HEMOGLOBIN: 7.7 g/dL — AB (ref 12.0–15.0)
MCH: 29.8 pg (ref 26.0–34.0)
MCHC: 30.2 g/dL (ref 30.0–36.0)
MCV: 98.8 fL (ref 78.0–100.0)
Platelets: 272 10*3/uL (ref 150–400)
RBC: 2.58 MIL/uL — AB (ref 3.87–5.11)
RDW: 18.7 % — ABNORMAL HIGH (ref 11.5–15.5)
WBC: 6.5 10*3/uL (ref 4.0–10.5)

## 2016-07-07 LAB — GLUCOSE, CAPILLARY
GLUCOSE-CAPILLARY: 85 mg/dL (ref 65–99)
GLUCOSE-CAPILLARY: 179 mg/dL — AB (ref 65–99)
GLUCOSE-CAPILLARY: 112 mg/dL — AB (ref 65–99)
Glucose-Capillary: 98 mg/dL (ref 65–99)

## 2016-07-07 NOTE — Progress Notes (Signed)
Subjective: Interval History: has no complaint, still coughing.  Objective: Vital signs in last 24 hours: Temp:  [97.7 F (36.5 C)-98.5 F (36.9 C)] 98.4 F (36.9 C) (01/29 0913) Pulse Rate:  [56-80] 80 (01/29 0913) Resp:  [16-18] 16 (01/29 0913) BP: (82-138)/(35-56) 124/35 (01/29 0913) SpO2:  [94 %-100 %] 94 % (01/29 0913) Weight change:   Intake/Output from previous day: 01/28 0701 - 01/29 0700 In: 480 [P.O.:480] Out: 1501 [Urine:1500; Stool:1] Intake/Output this shift: No intake/output data recorded.  General appearance: alert, cooperative, no distress, mildly obese and pale Neck: RIJ cath Resp: diminished breath sounds LLL and RLL, dullness to percussion bibasilar and rales mid lungs Cardio: S1, S2 normal and systolic murmur: systolic ejection 2/6, decrescendo at 2nd left intercostal space GI: obese, pos bs, liver down 5 cm Extremities: edema 2-3+  Lab Results:  Recent Labs  07/07/16 0506  WBC 6.5  HGB 7.7*  HCT 25.5*  PLT 272   BMET:  Recent Labs  07/06/16 0508 07/07/16 0506  NA 140 141  K 3.6 3.5  CL 101 101  CO2 30 31  GLUCOSE 82 80  BUN 15 21*  CREATININE 2.54* 3.17*  CALCIUM 8.4* 8.5*   No results for input(s): PTH in the last 72 hours. Iron Studies: No results for input(s): IRON, TIBC, TRANSFERRIN, FERRITIN in the last 72 hours.  Studies/Results: No results found.  I have reviewed the patient's current medications.  Assessment/Plan: 1 CKD3/AKI ACEI induced. Vol xs yet, showing recovery but need to lower vol, will do HD for that.  Solute rise slow so has some function 2 DM 3 HTN not an issue 4 Anemia  5 HPTh, check P Lasix, HD follow chem    LOS: 12 days   Sahan Pen L 07/07/2016,9:49 AM

## 2016-07-07 NOTE — Care Management Important Message (Signed)
Important Message  Patient Details  Name: CALLIAH KEHR MRN: [id number] Date of Birth: 03-02-1931   Medicare Important Message Given:  Yes    Jamell Opfer 07/07/2016, 1:55 PM

## 2016-07-07 NOTE — Care Management Note (Signed)
Case Management Note  Patient Details  Name: Debbie [name] MRN: [id number] Date of Birth: June 09, 1931  Subjective/Objective:         CM following for progression and d/c planning.            Action/Plan: 07/07/2016 Met with pt son briefly on Friday, 07/04/2016 , plan was to order DME for delivery to home prior to pt d/c as he and pt plan for her to go home and not return to the SNF. Spoke with pt this am 07/07/16 who continues to state that she will go home. Hospital bed, wheelchair and 3:1 ordered for delivery to home, [facility] to provide DME and son's phone number given to [facility].  [facility] also notified of [place] needs.   Expected Discharge Date:   (UNKNOWN)               Expected Discharge Plan:  [place] (from [facility])  In-House Referral:  Clinical Social Work  Discharge planning Services  CM Consult  Post Acute Care Choice:  Durable Medical Equipment, Home Health Choice offered to:  Adult Children  DME Arranged:  3-N-1, Hospital bed, Wheelchair manual DME Agency:  [place]:  RN, PT, OT, Nurse's Aide, Social Work [employer] Agency:  [place]  Status of Service:  In process, will continue to follow  If discussed at Long Length of Stay Meetings, dates discussed:    Additional Comments:  Adron Bene, RN 07/07/2016, 12:03 PM

## 2016-07-07 NOTE — Progress Notes (Signed)
[facility] PROGRESS NOTE  Debbie [name] [id number] DOB: 31-Jan-1931 DOA: 06/25/2016  PCP: Myriam Jacobson, MD  Brief History/Interval Summary: 83 year old female with a past medical history of COPD, oxygen dependent, diastolic CHF, diabetes, chronic kidney disease, presented with shortness of breath and was found to have low oxygen levels and low blood pressure. She was found to have acute on chronic kidney disease. Initially admitted to step down then had to be transferred to ICU due to hypotension. Started on hemodialysis. She was stabilized and then transferred to the floor. Patient started on high-dose Lasix. Continues to require dialysis on and off.  Reason for Visit: Acute renal failure on chronic kidney disease  Consultants: Nephrology  Procedures: Intermittent Hemodialysis. Last dialysis on 1/27  Antibiotics: None  Subjective/Interval History: Patient feels about the same. Denies any worsening shortness of breath. Denies any chest pain.   ROS: Denies any nausea or vomiting  Objective:  Vital Signs  Vitals:   07/06/16 2116 07/06/16 2127 07/07/16 0523 07/07/16 0721  BP: (!) 114/42  (!) 138/56   Pulse: (!) 56  65   Resp: 18  18   Temp: 97.7 F (36.5 C)  98.5 F (36.9 C)   TempSrc: Oral  Oral   SpO2: 99% 100% 98% 100%  Weight:      Height:        Intake/Output Summary (Last 24 hours) at 07/07/16 0910 Last data filed at 07/07/16 0524  Gross per 24 hour  Intake              240 ml  Output             1501 ml  Net            -1261 ml   Filed Weights   07/05/16 1345 07/05/16 1726 07/05/16 2154  Weight: 79.4 kg (175 lb 0.7 oz) 76.4 kg (168 lb 6.9 oz) 77.1 kg (169 lb 15.6 oz)    General appearance: alert, cooperative, appears stated age and no distress Resp: Somewhat improved air entry bilaterally but diminished at the bases. No wheezing. No rhonchi. Cardio: regular rate and rhythm, S1, S2 normal, no murmur, click, rub or gallop GI: soft,  non-tender; bowel sounds normal; no masses,  no organomegaly Extremities: Minimal edema bilateral lower extremities Neurologic: No focal deficits  Lab Results:  Data Reviewed: I have personally reviewed following labs and imaging studies  CBC:  Recent Labs Lab 06/30/16 1128 07/02/16 0404 07/03/16 0415 07/04/16 0435 07/07/16 0506  WBC 8.3 7.6 7.9 7.5 6.5  HGB 7.8* 7.4* 7.6* 7.6* 7.7*  HCT 25.1* 23.8* 24.8* 24.7* 25.5*  MCV 96.2 96.4 96.5 96.9 98.8  PLT 158 155 227 245 [id number]    Basic Metabolic Panel:  Recent Labs Lab 07/03/16 0415 07/04/16 0435 07/05/16 0407 07/06/16 0508 07/07/16 0506  NA 140 140 141 140 141  K 4.7 4.5 4.2 3.6 3.5  CL 101 100* 99* 101 101  CO2 30 30 32 30 31  GLUCOSE 93 78 89 82 80  BUN 30* 35* 36* 15 21*  CREATININE 3.96* 4.32* 4.52* 2.54* 3.17*  CALCIUM 8.5* 8.6* 8.6* 8.4* 8.5*  PHOS 4.9* 5.3* 5.0* 2.9 2.5    GFR: Estimated Creatinine Clearance: 13.6 mL/min (by C-G formula based on SCr of 3.17 mg/dL (H)).  Liver Function Tests:  Recent Labs Lab 07/03/16 0415 07/04/16 0435 07/05/16 0407 07/06/16 0508 07/07/16 0506  ALBUMIN 2.3* 2.3* 2.2* 2.3* 2.3*    CBG:  Recent Labs Lab 07/06/16  [id number] 07/06/16 1131 07/06/16 1623 07/06/16 2112 07/07/16 0749  GLUCAP 81 183* 101* 117* 85    Radiology Studies: No results found.   Medications:  Scheduled: . arformoterol  15 mcg Nebulization BID  . aspirin EC  81 mg Oral Daily  . atorvastatin  20 mg Oral q1800  . budesonide (PULMICORT) nebulizer solution  0.5 mg Nebulization BID  . darbepoetin (ARANESP) injection - NON-DIALYSIS  150 mcg Subcutaneous Q Wed-1800  . feeding supplement (NEPRO CARB STEADY)  237 mL Oral TID BM  . furosemide  160 mg Intravenous BID  . heparin  5,000 Units Subcutaneous Q8H  . insulin aspart  0-9 Units Subcutaneous TID WC  . multivitamin with minerals  1 tablet Oral Daily  . sodium chloride  500 mL Intravenous Once  . sodium chloride flush  10-40 mL  Intracatheter Q12H   Continuous:  [id number] chloride, sodium chloride, acetaminophen **OR** acetaminophen, albuterol, alteplase, heparin, lidocaine (PF), lidocaine-prilocaine, ondansetron **OR** ondansetron (ZOFRAN) IV, pentafluoroprop-tetrafluoroeth, sodium chloride, sodium chloride flush  Assessment/Plan:  Active Problems:   Renal failure   AKI (acute kidney injury) ([place])   Hyperkalemia   Atrial fibrillation (HCC)   Bradycardia   Central venous catheter in place    Acute kidney injury on chronic kidney disease, stage 3 Creatinine 7 on admission with a known baseline of 1.5. Patient was initially admitted to ICU. S/p CRRT while in the ICU. Nephrology is following. Now on intermittent hemodialysis, last dialyzed on 1/27. Pleural effusions noted on last chest x-ray. On high-dose Lasix.  Nephrology is managing.   Acute hypoxic resp failure Continues to need oxygen, which apparently she uses at home as well. Volume being managed with diuretics and dialysis.  Biateral pleural effusion Likely due to fluid overload. Chest x-ray repeated today and perhaps shows less effusion than a few days ago. Patient does not appear to be overtly tachypneic. I think her effusion will resolve with removal of volume, which is being done with Lasix and dialysis. If she does have significant dyspnea we could consider thoracentesis. Will repeat chest x-ray after more dialysis sessions.  Chronic Diastolic heart failure This is based off of an echocardiogram from December. Previously on torsemide. Now volume is mainly being addressed with dialysis and high-dose Lasix.  Episodic bradycardia Thought to be secondary to Hyperkalemia versus beta blocker. Resolved.  Recent H flu +/-HCAP Stable  History of COPD  Stable. Apparently on home oxygen. Continue home medications. No wheezing noted. Continue current medications.  Anemia of chronic disease Hemoglobin is low but stable. No evidence for overt  bleeding. Low iron and ferritin levels noted. Parenteral Iron was given.   History of Endometrial adenocarcinoma status post XRT Stable   DVT Prophylaxis: Subcutaneous heparin    Code Status: Full code  Family Communication: Discussed with the patient and her son. Patient wishes to go home instead of skilled nursing facility when ready for discharge. Disposition Plan: Mobilize. Management as outlined above.   LOS: 12 days   [facility] Pager [phone] 07/07/2016, 9:10 AM  If 7PM-7AM, please contact night-coverage at [email or web address], password [facility]

## 2016-07-07 NOTE — Progress Notes (Signed)
PT Cancellation Note  Patient Details Name: Debbie [name] MRN: [id number] DOB: 09-14-1930   Cancelled Treatment:    Reason Eval/Treat Not Completed: Patient at procedure or test/unavailable.  Pt just back from 2 different procedures and stated she wasn't ready. 07/07/2016  Donnella Sham, [place] [phone]  (pager)   [name] 07/07/2016, 6:08 PM

## 2016-07-07 NOTE — Procedures (Signed)
I was present at this session.  I have reviewed the session itself and made appropriate changes.  HD via [place] cath.  bp in 110-120.  tol vol off.    Latasha Buczkowski L 1/29/20183:42 PM

## 2016-07-08 LAB — RENAL FUNCTION PANEL
ALBUMIN: 2.2 g/dL — AB (ref 3.5–5.0)
Anion gap: 7 (ref 5–15)
BUN: 10 mg/dL (ref 6–20)
CHLORIDE: 102 mmol/L (ref 101–111)
CO2: 31 mmol/L (ref 22–32)
CREATININE: 2.14 mg/dL — AB (ref 0.44–1.00)
Calcium: 8.2 mg/dL — ABNORMAL LOW (ref 8.9–10.3)
GFR, EST AFRICAN AMERICAN: 23 mL/min — AB (ref >60)
GFR, EST NON AFRICAN AMERICAN: 20 mL/min — AB (ref >60)
Glucose, Bld: 79 mg/dL (ref 65–99)
PHOSPHORUS: 1.6 mg/dL — AB (ref 2.5–4.6)
Potassium: 3.8 mmol/L (ref 3.5–5.1)
Sodium: 140 mmol/L (ref 135–145)

## 2016-07-08 LAB — GLUCOSE, CAPILLARY
GLUCOSE-CAPILLARY: 96 mg/dL (ref 65–99)
GLUCOSE-CAPILLARY: 171 mg/dL — AB (ref 65–99)
GLUCOSE-CAPILLARY: 145 mg/dL — AB (ref 65–99)
GLUCOSE-CAPILLARY: 141 mg/dL — AB (ref 65–99)

## 2016-07-08 NOTE — Progress Notes (Signed)
Subjective: Interval History: has no complaint .  Objective: Vital signs in last 24 hours: Temp:  [97.8 F (36.6 C)-98.8 F (37.1 C)] 98.8 F (37.1 C) (01/30 0947) Pulse Rate:  [64-96] 96 (01/30 0947) Resp:  [16-20] 18 (01/30 0947) BP: (92-140)/(40-65) 100/64 (01/30 0947) SpO2:  [96 %-100 %] 96 % (01/30 0947) Weight:  [71.7 kg (158 lb)-76 kg (167 lb 8.8 oz)] 71.7 kg (158 lb) (01/30 0947) Weight change:   Intake/Output from previous day: 01/29 0701 - 01/30 0700 In: 370 [P.O.:360; I.V.:10] Out: 2600  Intake/Output this shift: Total I/O In: 240 [P.O.:240] Out: 650 [Urine:650]  General appearance: alert, cooperative, no distress and pale Neck: RIJ cath Resp: dullness in bases rales above and rhonchi Cardio: S1, S2 normal and systolic murmur: systolic ejection 2/6, decrescendo at 2nd left intercostal space GI: see below Extremities: pos bs,liver down 5 cm  2+ edema Lab Results:  Recent Labs  07/07/16 0506  WBC 6.5  HGB 7.7*  HCT 25.5*  PLT 272   BMET:  Recent Labs  07/07/16 0506 07/08/16 0507  NA 141 140  K 3.5 3.8  CL 101 102  CO2 31 31  GLUCOSE 80 79  BUN 21* 10  CREATININE 3.17* 2.14*  CALCIUM 8.5* 8.2*   No results for input(s): PTH in the last 72 hours. Iron Studies: No results for input(s): IRON, TIBC, TRANSFERRIN, FERRITIN in the last 72 hours.  Studies/Results: Dg Chest 2 View  Result Date: 07/07/2016 CLINICAL DATA:  Shortness of breath. EXAM: CHEST  2 VIEW COMPARISON:  Radiographs of July 03, 2016. FINDINGS: Stable cardiomediastinal silhouette. Atherosclerosis of thoracic aorta is noted. No pneumothorax is noted. Stable bilateral pleural effusions are noted with underlying atelectasis. Bony thorax is unremarkable. Right internal jugular catheter is noted with distal tip in expected position of SVC. IMPRESSION: Stable moderate bilateral pleural effusions with probable underlying atelectasis or edema. Aortic atherosclerosis. Electronically Signed    By: Marijo Conception, M.D.   On: 07/07/2016 10:34    I have reviewed the patient's current medications.  Assessment/Plan: 1 CKD3 /AKI vol xs improved withdialysis, cont Lasix.  Hopefully can sustain without HD 2 Anemia esa/Fe 3 HPTH 4 DM, 5 HTN not an issue 6 COPD  P Lasix, follow CR, urine vol.     LOS: 13 days   Jazziel Fitzsimmons L 07/08/2016,12:08 PM

## 2016-07-08 NOTE — Progress Notes (Signed)
[facility] PROGRESS NOTE  Debbie [name] [id number] DOB: 05-08-1931 DOA: 06/25/2016  PCP: Myriam Jacobson, MD  Brief History/Interval Summary: 83 year old female with a past medical history of COPD, oxygen dependent, diastolic CHF, diabetes, chronic kidney disease, presented with shortness of breath and was found to have low oxygen levels and low blood pressure. She was found to have acute on chronic kidney disease. Initially admitted to step down then had to be transferred to ICU due to hypotension. Started on hemodialysis. She was stabilized and then transferred to the floor. Patient started on high-dose Lasix. Continues to require dialysis on and off.  Reason for Visit: Acute renal failure on chronic kidney disease  Consultants: Nephrology  Procedures: Intermittent Hemodialysis. Last dialysis on 1/29  Antibiotics: None  Subjective/Interval History: Patient does feel better in terms of her breathing. Denies any chest pain. Denies any cough at this time.    ROS: Denies any nausea or vomiting  Objective:  Vital Signs  Vitals:   07/07/16 2140 07/08/16 0625 07/08/16 0905 07/08/16 0947  BP: (!) 117/55 (!) 123/49  100/64  Pulse: 87 74  96  Resp: 18 18  18   Temp: 97.8 F (36.6 C) 98.5 F (36.9 C)  98.8 F (37.1 C)  TempSrc: Oral Oral  Oral  SpO2: 99% 97% 96% 96%  Weight:    71.7 kg (158 lb)  Height:        Intake/Output Summary (Last 24 hours) at 07/08/16 0955 Last data filed at 07/08/16 0948  Gross per 24 hour  Intake              610 ml  Output             3250 ml  Net            -2640 ml   Filed Weights   07/05/16 2154 07/07/16 1333 07/08/16 0947  Weight: 77.1 kg (169 lb 15.6 oz) 76 kg (167 lb 8.8 oz) 71.7 kg (158 lb)    General appearance: alert, and no distress Resp: improved air entry bilaterally but diminished at the bases. No wheezing. No rhonchi. Cardio: regular rate and rhythm, S1, S2 normal, no murmur, click, rub or gallop GI: soft,  non-tender; bowel sounds normal; no masses,  no organomegaly Extremities: Minimal edema bilateral lower extremities Neurologic: No focal deficits  Lab Results:  Data Reviewed: I have personally reviewed following labs and imaging studies  CBC:  Recent Labs Lab 07/02/16 0404 07/03/16 0415 07/04/16 0435 07/07/16 0506  WBC 7.6 7.9 7.5 6.5  HGB 7.4* 7.6* 7.6* 7.7*  HCT 23.8* 24.8* 24.7* 25.5*  MCV 96.4 96.5 96.9 98.8  PLT 155 227 245 [id number]    Basic Metabolic Panel:  Recent Labs Lab 07/04/16 0435 07/05/16 0407 07/06/16 0508 07/07/16 0506 07/08/16 0507  NA 140 141 140 141 140  K 4.5 4.2 3.6 3.5 3.8  CL 100* 99* 101 101 102  CO2 30 32 30 31 31   GLUCOSE 78 89 82 80 79  BUN 35* 36* 15 21* 10  CREATININE 4.32* 4.52* 2.54* 3.17* 2.14*  CALCIUM 8.6* 8.6* 8.4* 8.5* 8.2*  PHOS 5.3* 5.0* 2.9 2.5 1.6*    GFR: Estimated Creatinine Clearance: 19.4 mL/min (by C-G formula based on SCr of 2.14 mg/dL (H)).  Liver Function Tests:  Recent Labs Lab 07/04/16 0435 07/05/16 0407 07/06/16 0508 07/07/16 0506 07/08/16 0507  ALBUMIN 2.3* 2.2* 2.3* 2.3* 2.2*    CBG:  Recent Labs Lab 07/07/16 0749 07/07/16 1220 07/07/16  1759 07/07/16 2045 07/08/16 0751  GLUCAP 85 179* 98 112* 96    Radiology Studies: Dg Chest 2 View  Result Date: 07/07/2016 CLINICAL DATA:  Shortness of breath. EXAM: CHEST  2 VIEW COMPARISON:  Radiographs of July 03, 2016. FINDINGS: Stable cardiomediastinal silhouette. Atherosclerosis of thoracic aorta is noted. No pneumothorax is noted. Stable bilateral pleural effusions are noted with underlying atelectasis. Bony thorax is unremarkable. Right internal jugular catheter is noted with distal tip in expected position of SVC. IMPRESSION: Stable moderate bilateral pleural effusions with probable underlying atelectasis or edema. Aortic atherosclerosis. Electronically Signed   By: Marijo Conception, M.D.   On: 07/07/2016 10:34     Medications:  Scheduled: .  arformoterol  15 mcg Nebulization BID  . aspirin EC  81 mg Oral Daily  . atorvastatin  20 mg Oral q1800  . budesonide (PULMICORT) nebulizer solution  0.5 mg Nebulization BID  . darbepoetin (ARANESP) injection - NON-DIALYSIS  150 mcg Subcutaneous Q Wed-1800  . feeding supplement (NEPRO CARB STEADY)  237 mL Oral TID BM  . furosemide  160 mg Intravenous BID  . heparin  5,000 Units Subcutaneous Q8H  . insulin aspart  0-9 Units Subcutaneous TID WC  . multivitamin with minerals  1 tablet Oral Daily  . sodium chloride  500 mL Intravenous Once  . sodium chloride flush  10-40 mL Intracatheter Q12H   Continuous:  [id number] chloride, sodium chloride, acetaminophen **OR** acetaminophen, albuterol, alteplase, heparin, lidocaine (PF), lidocaine-prilocaine, ondansetron **OR** ondansetron (ZOFRAN) IV, pentafluoroprop-tetrafluoroeth, sodium chloride, sodium chloride flush  Assessment/Plan:  Active Problems:   Renal failure   AKI (acute kidney injury) ([place])   Hyperkalemia   Atrial fibrillation (HCC)   Bradycardia   Central venous catheter in place    Acute kidney injury on chronic kidney disease, stage 3 Creatinine 7 on admission with a known baseline of 1.5. Patient was initially admitted to ICU. S/p CRRT while in the ICU. Nephrology is following. Now on intermittent hemodialysis, last dialyzed on 1/29. Pleural effusions noted on last chest x-ray. On high-dose Lasix. Nephrology is managing.   Acute hypoxic resp failure Improved. Continues to need oxygen, which apparently she uses at home as well. Volume being managed with diuretics and dialysis.  Biateral pleural effusion Likely due to fluid overload. On x-rays also reviewed. Patient has had pleural effusions since December. There was some discussion regarding possible thoracentesis. However, I do not think that this is needed at this time as the patient is minimally symptomatic and volume is being removed with dialysis. Discussed with Dr.  Jimmy Footman who also agrees. Discussed with patient and son, and they are agreeable with this approach.   Chronic Diastolic heart failure This is based off of an echocardiogram from December. Previously on torsemide. Now volume is mainly being addressed with dialysis and high-dose Lasix.  Episodic bradycardia Thought to be secondary to Hyperkalemia versus beta blocker. Resolved.  Recent H flu +/-HCAP Stable  History of COPD  Stable. Apparently on home oxygen. Continue home medications. No wheezing noted. Continue current medications.  Anemia of chronic disease Hemoglobin is low but stable. No evidence for overt bleeding. Low iron and ferritin levels noted. Parenteral Iron was given.   History of Endometrial adenocarcinoma status post XRT Stable   DVT Prophylaxis: Subcutaneous heparin    Code Status: Full code  Family Communication: Discussed with the patient and her son. Patient wishes to go home instead of skilled nursing facility when ready for discharge. Disposition Plan: Mobilize. Management as outlined  above. Continues to need dialysis.   LOS: 13 days   [place] [facility] Pager [phone] 07/08/2016, 9:55 AM  If 7PM-7AM, please contact night-coverage at [email or web address], password [facility]

## 2016-07-08 NOTE — Progress Notes (Signed)
Physical Therapy Treatment Patient Details Name: Debbie [name] MRN: [id number] DOB: Oct 13, 1930 Today's Date: 07/08/2016    History of Present Illness Pt is an 83 y/o female admitted from a SNF secondary to dyspnea and hyperkalemia. 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    PT Comments    Pt capable of doing more than she does.  Pt has improved, but does not push herself to do more than she has to.  Emphasis on there ex and gait.   Follow Up Recommendations  Home health PT;Supervision/Assistance - 24 hour     Equipment Recommendations  3in1 (PT)    Recommendations for Other Services       Precautions / Restrictions Precautions Precautions: Fall Precaution Comments: supplemental O2 dependent; monitor SPO2    Mobility  Bed Mobility Overal bed mobility: Needs Assistance Bed Mobility: Supine to Sit     Supine to sit: Min assist Sit to supine: Min assist   General bed mobility comments: increased time, use of rails and minimal assist.  Up via R elbow.  Pt doing well to assist  Transfers Overall transfer level: Needs assistance Equipment used: Rolling walker (2 wheeled) Transfers: Sit to/from Stand Sit to Stand: Min assist         General transfer comment: cues for hand placement,  needed assist to come forward and up.  Ambulation/Gait Ambulation/Gait assistance: Min assist Ambulation Distance (Feet): 85 Feet Assistive device: Rolling walker (2 wheeled) Gait Pattern/deviations: Step-through pattern;Decreased step length - right;Decreased step length - left Gait velocity: slower Gait velocity interpretation: Below normal speed for age/gender General Gait Details: pt needing encouragement to push the distance.  Noticeably fatiguing, but sats stayed 91% or better.  EHR though topped out at 113 bpm with steady decline into  the 90's after 1-2 minutes   Stairs            Wheelchair Mobility    Modified Rankin (Stroke Patients Only)       Balance Overall balance assessment: Needs assistance Sitting-balance support: Feet supported Sitting balance-Leahy Scale: Fair     Standing balance support: Bilateral upper extremity supported Standing balance-Leahy Scale: Poor Standing balance comment: reliant on the RW                    Cognition Arousal/Alertness: Awake/alert Behavior During Therapy: WFL for tasks assessed/performed Overall Cognitive Status: Within Functional Limits for tasks assessed                      Exercises General Exercises - Lower Extremity Ankle Circles/Pumps: AROM;Both;20 reps;Supine Heel Slides: AROM;Strengthening;Both;10 reps;Supine (graded resistance) Straight Leg Raises: AROM;AAROM;Both;10 reps;Supine Hip Flexion/Marching: AROM;Strengthening;Both;10 reps;Supine Other Exercises Other Exercises: bicep/tricep presess bil x10 reps.    General Comments        Pertinent Vitals/Pain Pain Assessment: Faces Faces Pain Scale: No hurt    Home Living                      Prior Function            PT Goals (current goals can now be found in the care plan section) Acute Rehab PT Goals Patient Stated Goal: none stated PT Goal Formulation: With patient/family Time For Goal Achievement: 07/12/16 Potential to Achieve Goals: Fair Progress towards PT goals: Progressing toward goals    Frequency  Min 3X/week      PT Plan Current plan remains appropriate    Co-evaluation             End of Session Equipment Utilized During Treatment: Oxygen Activity Tolerance: Patient tolerated treatment well;Patient limited by fatigue Patient left: in bed;with call bell/phone within reach     Time: 1501-1527 PT Time Calculation (min) (ACUTE ONLY): 26 min  Charges:  $Gait Training: 8-22 mins $Therapeutic Exercise: 8-22 mins                     G CodesTessie Fass [place] 07/08/2016, 3:43 PM 07/08/2016  Donnella Sham, [place] [phone]  (pager)

## 2016-07-09 DIAGNOSIS — I482 Chronic atrial fibrillation: Secondary | ICD-10-CM

## 2016-07-09 LAB — RENAL FUNCTION PANEL
ALBUMIN: 2.1 g/dL — AB (ref 3.5–5.0)
ANION GAP: 6 (ref 5–15)
BUN: 24 mg/dL — AB (ref 6–20)
CALCIUM: 8.3 mg/dL — AB (ref 8.9–10.3)
CO2: 31 mmol/L (ref 22–32)
CREATININE: 3.02 mg/dL — AB (ref 0.44–1.00)
Chloride: 101 mmol/L (ref 101–111)
GFR calc Af Amer: 15 mL/min — ABNORMAL LOW (ref >60)
GFR calc non Af Amer: 13 mL/min — ABNORMAL LOW (ref >60)
GLUCOSE: 94 mg/dL (ref 65–99)
PHOSPHORUS: 1.8 mg/dL — AB (ref 2.5–4.6)
Potassium: 3.7 mmol/L (ref 3.5–5.1)
SODIUM: 138 mmol/L (ref 135–145)

## 2016-07-09 LAB — GLUCOSE, CAPILLARY
Glucose-Capillary: 92 mg/dL (ref 65–99)
Glucose-Capillary: 159 mg/dL — ABNORMAL HIGH (ref 65–99)
Glucose-Capillary: 111 mg/dL — ABNORMAL HIGH (ref 65–99)
Glucose-Capillary: 126 mg/dL — ABNORMAL HIGH (ref 65–99)

## 2016-07-09 MED ORDER — GUAIFENESIN ER 600 MG PO TB12
600.0000 mg | ORAL_TABLET | Freq: Two times a day (BID) | ORAL | Status: DC
  Administered 2016-07-09 – 2016-07-19 (×19): 600 mg via ORAL
  Filled 2016-07-09 (×20): qty 1

## 2016-07-09 MED ORDER — FUROSEMIDE 80 MG PO TABS
160.0000 mg | ORAL_TABLET | Freq: Three times a day (TID) | ORAL | Status: DC
  Administered 2016-07-09 – 2016-07-12 (×9): 160 mg via ORAL
  Filled 2016-07-09 (×9): qty 2

## 2016-07-09 NOTE — Progress Notes (Signed)
PROGRESS NOTE  Debbie [name]  [id number] DOB: [birth year] DOA: 06/25/2016 PCP: Myriam Jacobson, MD Outpatient Specialists:  Subjective: Patient denies any complaints this morning  Brief Narrative:  83 year old female with a past medical history of COPD, oxygen dependent, diastolic CHF, diabetes, chronic kidney disease, presented with shortness of breath and was found to have low oxygen levels and low blood pressure. She was found to have acute on chronic kidney disease. Initially admitted to step down then had to be transferred to ICU due to hypotension. Started on hemodialysis. She was stabilized and then transferred to the floor. Patient started on high-dose Lasix. Continues to require dialysis on and off.  Assessment & Plan:   Active Problems:   Renal failure   AKI (acute kidney injury) ([place])   Hyperkalemia   Atrial fibrillation (HCC)   Bradycardia   Central venous catheter in place   Acute kidney injury on chronic kidney disease, stage 3 -Baseline creatinine is 2.1 from 1/5, presented with creatinine of 6.9. -Initially admitted to the ICU because of hypotension started on CRRT. -Nephrology following now on intermittent dialysis, last HD was on 1/29. -On high dose of Lasix, currently on 160 mg of IV Lasix twice a day. -She made about 1.7 L yesterday.  Acute on chronic hypoxic resp failure -Improved. Continues to need oxygen, which apparently she uses at home as well. Volume being managed with diuretics and dialysis.  Biateral pleural effusion -Likely due to fluid overload. On x-rays also reviewed. Patient has had pleural effusions since December.  -Bilateral effusion, likely thoracentesis is not going to help, this is likely will improve with diuresis.  Chronic Diastolic heart failure -This is based off of an echocardiogram from December. Previously on torsemide.  -She is behind on a high dose of Lasix, nephrology is controlling the Gwyndolyn Saxon.  Episodic  bradycardia -Thought to be secondary to Hyperkalemia versus beta blocker. Resolved.  Recent H flu +/-HCAP -Stable  History of COPD  -Stable. Apparently on home oxygen. Continue home medications. No wheezing noted. Continue current medications.  Anemia of chronic disease -Hemoglobin is low but stable. No evidence for overt bleeding. Low iron and ferritin levels noted. Parenteral Iron was given.   History of Endometrial adenocarcinoma status post XRT -Stable   DVT prophylaxis: Subcutaneous heparin Code Status: Full Code Family Communication:  Disposition Plan:  Diet: Diet Carb Modified Fluid consistency: Thin; Room service appropriate? Yes; Fluid restriction: 1200 mL Fluid  Consultants:   Nephrology  Procedures:   Dialysis  Antimicrobials:   None  Objective: Vitals:   07/08/16 2032 07/08/16 2055 07/09/16 0512 07/09/16 0935  BP: (!) 109/41  (!) 105/54 (!) 78/44  Pulse: 64 72 66 71  Resp: 18 18 18 16   Temp: 98.7 F (37.1 C)  97.7 F (36.5 C) 98.7 F (37.1 C)  TempSrc: Oral  Oral Oral  SpO2: 98% 98% 99% 95%  Weight: 71.9 kg (158 lb 8.2 oz)     Height:        Intake/Output Summary (Last 24 hours) at 07/09/16 1120 Last data filed at 07/09/16 0936  Gross per 24 hour  Intake             1026 ml  Output              915 ml  Net              111 ml   Filed Weights   07/07/16 1333 07/08/16 0947 07/08/16 2032  Weight: 76 kg (167  lb 8.8 oz) 71.7 kg (158 lb) 71.9 kg (158 lb 8.2 oz)    Examination: General exam: Appears calm and comfortable  Respiratory system: Clear to auscultation. Respiratory effort normal. Cardiovascular system: S1 & S2 heard, RRR. No JVD, murmurs, rubs, gallops or clicks. No pedal edema. Gastrointestinal system: Abdomen is nondistended, soft and nontender. No organomegaly or masses felt. Normal bowel sounds heard. Central nervous system: Alert and oriented. No focal neurological deficits. Extremities: Symmetric 5 x 5 power. Skin: No  rashes, lesions or ulcers Psychiatry: Judgement and insight appear normal. Mood & affect appropriate.   Data Reviewed: I have personally reviewed following labs and imaging studies  CBC:  Recent Labs Lab 07/03/16 0415 07/04/16 0435 07/07/16 0506  WBC 7.9 7.5 6.5  HGB 7.6* 7.6* 7.7*  HCT 24.8* 24.7* 25.5*  MCV 96.5 96.9 98.8  PLT 227 245 [id number]   Basic Metabolic Panel:  Recent Labs Lab 07/05/16 0407 07/06/16 0508 07/07/16 0506 07/08/16 0507 07/09/16 0456  NA 141 140 141 140 138  K 4.2 3.6 3.5 3.8 3.7  CL 99* 101 101 102 101  CO2 32 30 31 31 31   GLUCOSE 89 82 80 79 94  BUN 36* 15 21* 10 24*  CREATININE 4.52* 2.54* 3.17* 2.14* 3.02*  CALCIUM 8.6* 8.4* 8.5* 8.2* 8.3*  PHOS 5.0* 2.9 2.5 1.6* 1.8*   GFR: Estimated Creatinine Clearance: 13.8 mL/min (by C-G formula based on SCr of 3.02 mg/dL (H)). Liver Function Tests:  Recent Labs Lab 07/05/16 0407 07/06/16 0508 07/07/16 0506 07/08/16 0507 07/09/16 0456  ALBUMIN 2.2* 2.3* 2.3* 2.2* 2.1*   No results for input(s): LIPASE, AMYLASE in the last 168 hours. No results for input(s): AMMONIA in the last 168 hours. Coagulation Profile: No results for input(s): INR, PROTIME in the last 168 hours. Cardiac Enzymes: No results for input(s): CKTOTAL, CKMB, CKMBINDEX, TROPONINI in the last 168 hours. BNP (last 3 results) No results for input(s): PROBNP in the last 8760 hours. HbA1C: No results for input(s): HGBA1C in the last 72 hours. CBG:  Recent Labs Lab 07/08/16 0751 07/08/16 1147 07/08/16 1702 07/08/16 2028 07/09/16 0743  GLUCAP 96 171* 145* 141* 92   Lipid Profile: No results for input(s): CHOL, HDL, LDLCALC, TRIG, CHOLHDL, LDLDIRECT in the last 72 hours. Thyroid Function Tests: No results for input(s): TSH, T4TOTAL, FREET4, T3FREE, THYROIDAB in the last 72 hours. Anemia Panel: No results for input(s): VITAMINB12, FOLATE, FERRITIN, TIBC, IRON, RETICCTPCT in the last 72 hours. Urine analysis:    Component  Value Date/Time   COLORURINE YELLOW 06/26/2016 0028   APPEARANCEUR CLOUDY (A) 06/26/2016 0028   LABSPEC 1.013 06/26/2016 0028   PHURINE 5.0 06/26/2016 0028   GLUCOSEU 50 (A) 06/26/2016 0028   HGBUR NEGATIVE 06/26/2016 0028   BILIRUBINUR NEGATIVE 06/26/2016 0028   KETONESUR NEGATIVE 06/26/2016 0028   PROTEINUR 100 (A) 06/26/2016 0028   UROBILINOGEN 1.0 03/07/2014 1510   NITRITE NEGATIVE 06/26/2016 0028   LEUKOCYTESUR TRACE (A) 06/26/2016 0028   Sepsis Labs: @LABRCNTIP (procalcitonin:4,lacticidven:4)  )No results found for this or any previous visit (from the past 240 hour(s)).   Invalid input(s): PROCALCITONIN, [place]   Radiology Studies: No results found.      Scheduled Meds: . arformoterol  15 mcg Nebulization BID  . aspirin EC  81 mg Oral Daily  . atorvastatin  20 mg Oral q1800  . budesonide (PULMICORT) nebulizer solution  0.5 mg Nebulization BID  . darbepoetin (ARANESP) injection - NON-DIALYSIS  150 mcg Subcutaneous Q Wed-1800  . feeding  supplement (NEPRO CARB STEADY)  237 mL Oral TID BM  . furosemide  160 mg Intravenous BID  . heparin  5,000 Units Subcutaneous Q8H  . insulin aspart  0-9 Units Subcutaneous TID WC  . multivitamin with minerals  1 tablet Oral Daily  . sodium chloride  500 mL Intravenous Once  . sodium chloride flush  10-40 mL Intracatheter Q12H   Continuous Infusions:   LOS: 14 days    Time spent: 35 minutes    Junie Avilla A, MD [facility] Pager [phone]  If 7PM-7AM, please contact night-coverage [email or web address] Password [email or web address] 07/09/2016, 11:20 AM

## 2016-07-09 NOTE — Progress Notes (Signed)
Subjective: Interval History: has complaints weak.  Objective: Vital signs in last 24 hours: Temp:  [97.7 F (36.5 C)-99.2 F (37.3 C)] 98.7 F (37.1 C) (01/31 0935) Pulse Rate:  [64-85] 71 (01/31 0935) Resp:  [16-18] 16 (01/31 0935) BP: (78-126)/(41-54) 78/44 (01/31 0935) SpO2:  [95 %-99 %] 95 % (01/31 0935) Weight:  [71.9 kg (158 lb 8.2 oz)] 71.9 kg (158 lb 8.2 oz) (01/30 2032) Weight change: -4.332 kg (-9 lb 8.8 oz)  Intake/Output from previous day: 01/30 0701 - 01/31 0700 In: 1989 [P.O.:1197; IV Piggyback:792] Out: [id number] [Urine:1715] Intake/Output this shift: Total I/O In: 240 [P.O.:240] Out: 0   General appearance: alert, cooperative, no distress, mildly obese and pale Neck: RIJ cath Resp: rales bibasilar and rhonchi bibasilar Cardio: S1, S2 normal and systolic murmur: systolic ejection 2/6, decrescendo at 2nd left intercostal space GI: obese, pos bs, liver down 6 cm Extremities: edema 2-3+  Lab Results:  Recent Labs  07/07/16 0506  WBC 6.5  HGB 7.7*  HCT 25.5*  PLT 272   BMET:  Recent Labs  07/08/16 0507 07/09/16 0456  NA 140 138  K 3.8 3.7  CL 102 101  CO2 31 31  GLUCOSE 79 94  BUN 10 24*  CREATININE 2.14* 3.02*  CALCIUM 8.2* 8.3*   No results for input(s): PTH in the last 72 hours. Iron Studies: No results for input(s): IRON, TIBC, TRANSFERRIN, FERRITIN in the last 72 hours.  Studies/Results: No results found.  I have reviewed the patient's current medications.  Assessment/Plan: 1 AKI vol xs, Cr ^ post HD, follow.  Will try po diuretics and see if will stablize 2 Anemia esa/Fe 3 DM 4 CAD  5 Carotid dz P po diuretics, follow Cr, vol , if cont stable no furthur HD   LOS: 14 days   Crystalmarie Yasin L 07/09/2016,11:41 AM

## 2016-07-09 NOTE — Progress Notes (Signed)
Patient had episode  HR in the high 30's while in deep sleep remains afib.. CMD called RN to notify about HR. Pt. Asymptomatic, with also noted some pauses > 2 seconds. K. SchorrNP was notified.

## 2016-07-10 LAB — RENAL FUNCTION PANEL
Albumin: 2.2 g/dL — ABNORMAL LOW (ref 3.5–5.0)
Anion gap: 5 (ref 5–15)
BUN: 37 mg/dL — AB (ref 6–20)
CHLORIDE: 101 mmol/L (ref 101–111)
CO2: 32 mmol/L (ref 22–32)
Calcium: 8.5 mg/dL — ABNORMAL LOW (ref 8.9–10.3)
Creatinine, Ser: 3.61 mg/dL — ABNORMAL HIGH (ref 0.44–1.00)
GFR calc Af Amer: 12 mL/min — ABNORMAL LOW (ref >60)
GFR calc non Af Amer: 11 mL/min — ABNORMAL LOW (ref >60)
Glucose, Bld: 124 mg/dL — ABNORMAL HIGH (ref 65–99)
POTASSIUM: 4.1 mmol/L (ref 3.5–5.1)
Phosphorus: 1.9 mg/dL — ABNORMAL LOW (ref 2.5–4.6)
Sodium: 138 mmol/L (ref 135–145)

## 2016-07-10 LAB — GLUCOSE, CAPILLARY
GLUCOSE-CAPILLARY: 166 mg/dL — AB (ref 65–99)
Glucose-Capillary: 115 mg/dL — ABNORMAL HIGH (ref 65–99)
Glucose-Capillary: 140 mg/dL — ABNORMAL HIGH (ref 65–99)
Glucose-Capillary: 122 mg/dL — ABNORMAL HIGH (ref 65–99)

## 2016-07-10 MED ORDER — METOLAZONE 10 MG PO TABS
10.0000 mg | ORAL_TABLET | Freq: Every day | ORAL | Status: DC
Start: 2016-07-10 — End: 2016-07-12
  Administered 2016-07-10 – 2016-07-11 (×2): 10 mg via ORAL
  Filled 2016-07-10 (×3): qty 1

## 2016-07-10 NOTE — Progress Notes (Signed)
PROGRESS NOTE  Debbie [name]  [id number] DOB: 26-May-1931 DOA: 06/25/2016 PCP: Myriam Jacobson, MD Outpatient Specialists:  Subjective: Denies any significant complaints this morning.  Brief Narrative:  83 year old female with a past medical history of COPD, oxygen dependent, diastolic CHF, diabetes, chronic kidney disease, presented with shortness of breath and was found to have low oxygen levels and low blood pressure. She was found to have acute on chronic kidney disease. Initially admitted to step down then had to be transferred to ICU due to hypotension. Started on hemodialysis. She was stabilized and then transferred to the floor. Patient started on high-dose Lasix. Continues to require dialysis on and off.  Assessment & Plan:   Active Problems:   Renal failure   AKI (acute kidney injury) ([place])   Hyperkalemia   Atrial fibrillation (HCC)   Bradycardia   Central venous catheter in place   Acute kidney injury on chronic kidney disease, stage 3 -Baseline creatinine is 2.1 from 1/5, presented with creatinine of 6.9. -Initially admitted to the ICU because of hypotension started on CRRT. -Nephrology following now on intermittent dialysis, last HD was on 1/29. -Per charted output only 800 mL per day, on oral Lasix, metolazone added by nephrology. -Follow urine output and creatinine (increased from yesterday, creatinine is 3.6(  Acute on chronic hypoxic resp failure -Improved. Continues to need oxygen, which apparently she uses at home as well. Volume being managed with diuretics and dialysis.  Biateral pleural effusion -Likely due to fluid overload. On x-rays also reviewed. Patient has had pleural effusions since December.  -Bilateral effusion, likely thoracentesis is not going to help, this is likely will improve with diuresis.  Chronic Diastolic heart failure -This is based off of an echocardiogram from December. Previously on torsemide.  -She is behind on a high  dose of Lasix, nephrology is controlling the Gwyndolyn Saxon.  Episodic bradycardia -Thought to be secondary to Hyperkalemia versus beta blocker. Resolved.  Recent H flu +/-HCAP -Stable  History of COPD  -Stable. Apparently on home oxygen. Continue home medications. No wheezing noted. Continue current medications.  Anemia of chronic disease -Hemoglobin is low but stable. No evidence for overt bleeding. Low iron and ferritin levels noted. Parenteral Iron was given.   History of Endometrial adenocarcinoma status post XRT -Stable   DVT prophylaxis: Subcutaneous heparin Code Status: Full Code Family Communication:  Disposition Plan: Await creatinine stabilization and reliable urine output Diet: Diet Carb Modified Fluid consistency: Thin; Room service appropriate? Yes; Fluid restriction: 1200 mL Fluid  Consultants:   Nephrology  Procedures:   Dialysis  Antimicrobials:   None  Objective: Vitals:   07/09/16 2119 07/10/16 0515 07/10/16 0955 07/10/16 1000  BP: (!) 128/51 (!) 130/42  (!) 101/41  Pulse: 70 64 69 70  Resp: 16 16 18 18   Temp: 98.3 F (36.8 C) 98.2 F (36.8 C)  98.4 F (36.9 C)  TempSrc: Oral Oral  Oral  SpO2: 100% 100% 99% 100%  Weight:      Height:        Intake/Output Summary (Last 24 hours) at 07/10/16 1130 Last data filed at 07/10/16 1000  Gross per 24 hour  Intake              430 ml  Output             1050 ml  Net             -620 ml   Filed Weights   07/07/16 1333 07/08/16 0947 07/08/16 2032  Weight: 76 kg (167 lb 8.8 oz) 71.7 kg (158 lb) 71.9 kg (158 lb 8.2 oz)    Examination: General exam: Appears calm and comfortable  Respiratory system: Clear to auscultation. Respiratory effort normal. Cardiovascular system: S1 & S2 heard, RRR. No JVD, murmurs, rubs, gallops or clicks. No pedal edema. Gastrointestinal system: Abdomen is nondistended, soft and nontender. No organomegaly or masses felt. Normal bowel sounds heard. Central nervous system:  Alert and oriented. No focal neurological deficits. Extremities: Symmetric 5 x 5 power. Skin: No rashes, lesions or ulcers Psychiatry: Judgement and insight appear normal. Mood & affect appropriate.   Data Reviewed: I have personally reviewed following labs and imaging studies  CBC:  Recent Labs Lab 07/04/16 0435 07/07/16 0506  WBC 7.5 6.5  HGB 7.6* 7.7*  HCT 24.7* 25.5*  MCV 96.9 98.8  PLT 245 [id number]   Basic Metabolic Panel:  Recent Labs Lab 07/06/16 0508 07/07/16 0506 07/08/16 0507 07/09/16 0456 07/10/16 0458  NA 140 141 140 138 138  K 3.6 3.5 3.8 3.7 4.1  CL 101 101 102 101 101  CO2 30 31 31 31  32  GLUCOSE 82 80 79 94 124*  BUN 15 21* 10 24* 37*  CREATININE 2.54* 3.17* 2.14* 3.02* 3.61*  CALCIUM 8.4* 8.5* 8.2* 8.3* 8.5*  PHOS 2.9 2.5 1.6* 1.8* 1.9*   GFR: Estimated Creatinine Clearance: 11.5 mL/min (by C-G formula based on SCr of 3.61 mg/dL (H)). Liver Function Tests:  Recent Labs Lab 07/06/16 0508 07/07/16 0506 07/08/16 0507 07/09/16 0456 07/10/16 0458  ALBUMIN 2.3* 2.3* 2.2* 2.1* 2.2*   No results for input(s): LIPASE, AMYLASE in the last 168 hours. No results for input(s): AMMONIA in the last 168 hours. Coagulation Profile: No results for input(s): INR, PROTIME in the last 168 hours. Cardiac Enzymes: No results for input(s): CKTOTAL, CKMB, CKMBINDEX, TROPONINI in the last 168 hours. BNP (last 3 results) No results for input(s): PROBNP in the last 8760 hours. HbA1C: No results for input(s): HGBA1C in the last 72 hours. CBG:  Recent Labs Lab 07/09/16 0743 07/09/16 1118 07/09/16 1710 07/09/16 2116 07/10/16 0749  GLUCAP 92 159* 111* 126* 115*   Lipid Profile: No results for input(s): CHOL, HDL, LDLCALC, TRIG, CHOLHDL, LDLDIRECT in the last 72 hours. Thyroid Function Tests: No results for input(s): TSH, T4TOTAL, FREET4, T3FREE, THYROIDAB in the last 72 hours. Anemia Panel: No results for input(s): VITAMINB12, FOLATE, FERRITIN, TIBC, IRON,  RETICCTPCT in the last 72 hours. Urine analysis:    Component Value Date/Time   COLORURINE YELLOW 06/26/2016 0028   APPEARANCEUR CLOUDY (A) 06/26/2016 0028   LABSPEC 1.013 06/26/2016 0028   PHURINE 5.0 06/26/2016 0028   GLUCOSEU 50 (A) 06/26/2016 0028   HGBUR NEGATIVE 06/26/2016 0028   BILIRUBINUR NEGATIVE 06/26/2016 0028   KETONESUR NEGATIVE 06/26/2016 0028   PROTEINUR 100 (A) 06/26/2016 0028   UROBILINOGEN 1.0 03/07/2014 1510   NITRITE NEGATIVE 06/26/2016 0028   LEUKOCYTESUR TRACE (A) 06/26/2016 0028   Sepsis Labs: @LABRCNTIP (procalcitonin:4,lacticidven:4)  )No results found for this or any previous visit (from the past 240 hour(s)).   Invalid input(s): PROCALCITONIN, [place]   Radiology Studies: No results found.      Scheduled Meds: . arformoterol  15 mcg Nebulization BID  . aspirin EC  81 mg Oral Daily  . atorvastatin  20 mg Oral q1800  . budesonide (PULMICORT) nebulizer solution  0.5 mg Nebulization BID  . darbepoetin (ARANESP) injection - NON-DIALYSIS  150 mcg Subcutaneous Q Wed-1800  . feeding supplement (NEPRO CARB  STEADY)  237 mL Oral TID BM  . furosemide  160 mg Oral TID  . guaiFENesin  600 mg Oral BID  . heparin  5,000 Units Subcutaneous Q8H  . insulin aspart  0-9 Units Subcutaneous TID WC  . metolazone  10 mg Oral Daily  . multivitamin with minerals  1 tablet Oral Daily  . sodium chloride  500 mL Intravenous Once  . sodium chloride flush  10-40 mL Intracatheter Q12H   Continuous Infusions:   LOS: 15 days    Time spent: 35 minutes    Karston Hyland A, MD [facility] Pager [phone]  If 7PM-7AM, please contact night-coverage [email or web address] Password [email or web address] 07/10/2016, 11:30 AM

## 2016-07-10 NOTE — Progress Notes (Signed)
Physical Therapy Treatment Patient Details Name: Debbie [name] MRN: [id number] DOB: 1930/10/25 Today's Date: 07/10/2016    History of Present Illness Pt is an 83 y/o female admitted from a SNF secondary to dyspnea and hyperkalemia. 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    PT Comments    Needed the requisite encouragement, but generally had her best day with active/resisted exercise and ambulated generally steady with the RW for 160', sats at 93% and EHR 99-0103.  Follow Up Recommendations  Home health PT;Supervision/Assistance - 24 hour     Equipment Recommendations  3in1 (PT)    Recommendations for Other Services       Precautions / Restrictions Precautions Precautions: Fall Precaution Comments: supplemental O2 dependent; monitor SPO2 Restrictions Weight Bearing Restrictions: No    Mobility  Bed Mobility Overal bed mobility: Needs Assistance Bed Mobility: Sit to Supine       Sit to supine: Min guard   General bed mobility comments: little effort to swing her legs into bed now.  Pt assists bridging and pull to [facility]  Transfers Overall transfer level: Needs assistance   Transfers: Sit to/from Stand Sit to Stand: Min assist         General transfer comment: cues for hand placement and stability assist, not boost assist today  Ambulation/Gait Ambulation/Gait assistance: Min guard Ambulation Distance (Feet): 160 Feet Assistive device: Rolling walker (2 wheeled) Gait Pattern/deviations: Step-through pattern;Decreased step length - right;Decreased step length - left Gait velocity: slower Gait velocity interpretation: Below normal speed for age/gender General Gait Details: generally steady, visibly fatiguing, but not as uncoordinated with RW as she fatigues now   Stairs Stairs: Yes          Wheelchair Mobility     Modified Rankin (Stroke Patients Only)       Balance Overall balance assessment: Needs assistance Sitting-balance support: Feet supported Sitting balance-Leahy Scale: Fair     Standing balance support: Bilateral upper extremity supported Standing balance-Leahy Scale: Poor Standing balance comment: reliant on the RW                    Cognition Arousal/Alertness: Awake/alert Behavior During Therapy: WFL for tasks assessed/performed Overall Cognitive Status: Within Functional Limits for tasks assessed                      Exercises General Exercises - Lower Extremity Long Arc Quad: AROM;Strengthening;Both;15 reps;Seated Straight Leg Raises: AROM;Strengthening;Both;10 reps;Seated Hip Flexion/Marching: AROM;Strengthening;Both;15 reps;Seated Toe Raises: AROM;Strengthening;Both;15 reps;Seated Heel Raises: AROM;Strengthening;Both;15 reps;Seated    General Comments General comments (skin integrity, edema, etc.): sats on 4L [place] at 93% and 99-104 bpm      Pertinent Vitals/Pain Pain Assessment: Faces Faces Pain Scale: Hurts a little bit Pain Location: knees Pain Descriptors / Indicators: Grimacing Pain Intervention(s): Monitored during session    Home Living                      Prior Function            PT Goals (current goals can now be found in the care plan section) Acute Rehab PT Goals Patient Stated Goal: home PT Goal Formulation: With patient/family Time For Goal Achievement: 07/12/16 Potential to Achieve Goals: Fair Progress towards PT goals: Progressing toward goals    Frequency    Min 3X/week  PT Plan Current plan remains appropriate    Co-evaluation             End of Session Equipment Utilized During Treatment: Oxygen Activity Tolerance: Patient tolerated treatment well Patient left: in bed;with call bell/phone within reach     Time: 1650-1711 PT Time Calculation (min) (ACUTE ONLY): 21 min  Charges:   $Gait Training: 8-22 mins                    G CodesTessie Fass Deonte Otting 07/10/2016, 5:19 PM 07/10/2016  Donnella Sham, PT [phone]  (pager)

## 2016-07-10 NOTE — Plan of Care (Signed)
Problem: Nutrition: Goal: Adequate nutrition will be maintained Outcome: Completed/Met Date Met: 07/10/16 nepro given between meals

## 2016-07-10 NOTE — Progress Notes (Signed)
Nutrition Follow-up  DOCUMENTATION CODES:   Obesity unspecified  INTERVENTION:  Continue Nepro Shake po BID, each supplement provides 425 kcal and 19 grams protein.  Encourage adequate PO intake.   NUTRITION DIAGNOSIS:   Inadequate oral intake related to acute illness, poor appetite as evidenced by per patient/family report, meal completion < 50%; improved  GOAL:   Patient will meet greater than or equal to 90% of their needs; met  MONITOR:   PO intake, Supplement acceptance, Labs, Weight trends  REASON FOR ASSESSMENT:   Consult Poor PO  ASSESSMENT:   83 yo female admitted with dypsnea and hyperkalemia with volue overload; pt with acute on CKD III. Pt with additional hx of DM, COPD, HTN, endometrial and vaginal cancer, PVD Required CRRT 1/17-1/19, transitioned to intermittent HD Last HD 1/29. Pt diuresing.   Meal completion has been mostly 50-100%. Pt currently has Nepro Shake ordered and has been consuming them. RD to continue with current orders to aid in caloric and protein needs. Labs and medications reviewed.   Diet Order:  Diet Carb Modified Fluid consistency: Thin; Room service appropriate? Yes; Fluid restriction: 1200 mL Fluid  Skin:  Wound (see comment) (stage I coccyx)  Last BM:  1/31  Height:   Ht Readings from Last 1 Encounters:  06/27/16 _0  (1.651 m)    Weight:   Wt Readings from Last 1 Encounters:  07/08/16 158 lb 8.2 oz (71.9 kg)    Ideal Body Weight:  56.8 kg  BMI:  Body mass index is 26.38 kg/m.  Estimated Nutritional Needs:   Kcal:  1700-2000 kcals  Protein:  94-117 g   Fluid:  1.2 L/day  EDUCATION NEEDS:   Education needs addressed  Corrin Parker, MS, RD, LDN Pager # [phone] After hours/ weekend pager # [phone]

## 2016-07-10 NOTE — Progress Notes (Signed)
Subjective: Interval History: has no complaint .  Objective: Vital signs in last 24 hours: Temp:  [98.2 F (36.8 C)-98.4 F (36.9 C)] 98.4 F (36.9 C) (02/01 1000) Pulse Rate:  [64-85] 70 (02/01 1000) Resp:  [14-18] 18 (02/01 1000) BP: (101-163)/(41-65) 101/41 (02/01 1000) SpO2:  [94 %-100 %] 100 % (02/01 1000) Weight change:   Intake/Output from previous day: 01/31 0701 - 02/01 0700 In: 430 [P.O.:420; I.V.:10] Out: 800 [Urine:800] Intake/Output this shift: Total I/O In: 240 [P.O.:240] Out: 250 [Urine:250]  General appearance: alert, cooperative, no distress, mildly obese and pale Neck: RIJ cath Resp: rales bibasilar Cardio: S1, S2 normal and systolic murmur: holosystolic 2/6, blowing at apex GI: pos bs,liver down 4 cm, soft Extremities: edema 2-3+  Lab Results: No results for input(s): WBC, HGB, HCT, PLT in the last 72 hours. BMET:  Recent Labs  07/09/16 0456 07/10/16 0458  NA 138 138  K 3.7 4.1  CL 101 101  CO2 31 32  GLUCOSE 94 124*  BUN 24* 37*  CREATININE 3.02* 3.61*  CALCIUM 8.3* 8.5*   No results for input(s): PTH in the last 72 hours. Iron Studies: No results for input(s): IRON, TIBC, TRANSFERRIN, FERRITIN in the last 72 hours.  Studies/Results: No results found.  I have reviewed the patient's current medications.  Assessment/Plan: 1 AKI  Responding to diuretics po but not a large net neg.  Add metol.  Cr ^ , will see if levels out 2 Iatrogenic fluid overload 3 Anemia esa  4 DM controlled 5 Debill 6 COPD P lasix, metol, follow chem, mobilize  LOS: 15 days   Debbie [name] L 07/10/2016,10:37 AM

## 2016-07-11 LAB — GLUCOSE, CAPILLARY
Glucose-Capillary: 107 mg/dL — ABNORMAL HIGH (ref 65–99)
Glucose-Capillary: 201 mg/dL — ABNORMAL HIGH (ref 65–99)
Glucose-Capillary: 124 mg/dL — ABNORMAL HIGH (ref 65–99)
Glucose-Capillary: 139 mg/dL — ABNORMAL HIGH (ref 65–99)

## 2016-07-11 LAB — RENAL FUNCTION PANEL
Albumin: 2.2 g/dL — ABNORMAL LOW (ref 3.5–5.0)
Anion gap: 10 (ref 5–15)
BUN: 47 mg/dL — AB (ref 6–20)
CHLORIDE: 97 mmol/L — AB (ref 101–111)
CO2: 31 mmol/L (ref 22–32)
CREATININE: 3.87 mg/dL — AB (ref 0.44–1.00)
Calcium: 8.7 mg/dL — ABNORMAL LOW (ref 8.9–10.3)
GFR calc Af Amer: 11 mL/min — ABNORMAL LOW (ref >60)
GFR, EST NON AFRICAN AMERICAN: 10 mL/min — AB (ref >60)
Glucose, Bld: 112 mg/dL — ABNORMAL HIGH (ref 65–99)
Phosphorus: 1.9 mg/dL — ABNORMAL LOW (ref 2.5–4.6)
Potassium: 3.7 mmol/L (ref 3.5–5.1)
Sodium: 138 mmol/L (ref 135–145)

## 2016-07-11 LAB — CBC
HCT: 25.1 % — ABNORMAL LOW (ref 36.0–46.0)
Hemoglobin: 7.5 g/dL — ABNORMAL LOW (ref 12.0–15.0)
MCH: 29.6 pg (ref 26.0–34.0)
MCHC: 29.9 g/dL — ABNORMAL LOW (ref 30.0–36.0)
MCV: 99.2 fL (ref 78.0–100.0)
Platelets: 269 10*3/uL (ref 150–400)
RBC: 2.53 MIL/uL — ABNORMAL LOW (ref 3.87–5.11)
RDW: 20.2 % — AB (ref 11.5–15.5)
WBC: 6.1 10*3/uL (ref 4.0–10.5)

## 2016-07-11 NOTE — Progress Notes (Signed)
Subjective: Interval History: has no complaint. Getting up , working on strength.  Objective: Vital signs in last 24 hours: Temp:  [97.7 F (36.5 C)-98.6 F (37 C)] 98.6 F (37 C) (02/02 0946) Pulse Rate:  [62-78] 62 (02/02 0946) Resp:  [18-20] 18 (02/02 0946) BP: (99-115)/(32-57) 100/32 (02/02 0946) SpO2:  [97 %-100 %] 100 % (02/02 0946) Weight:  [74.4 kg (164 lb)] 74.4 kg (164 lb) (02/01 2100) Weight change:   Intake/Output from previous day: 02/01 0701 - 02/02 0700 In: 720 [P.O.:720] Out: 1600 [Urine:1600] Intake/Output this shift: Total I/O In: 240 [P.O.:240] Out: -   General appearance: alert, cooperative, mildly obese and pale Neck: RIJ cath Resp: diminished breath sounds bilaterally and rales bibasilar Cardio: S1, S2 normal and systolic murmur: systolic ejection 2/6, decrescendo at 2nd left intercostal space GI: pos bs, soft,liver down 5 cm Extremities: edema 2-3+  Lab Results:  Recent Labs  07/11/16 0447  WBC 6.1  HGB 7.5*  HCT 25.1*  PLT 269   BMET:  Recent Labs  07/10/16 0458 07/11/16 0447  NA 138 138  K 4.1 3.7  CL 101 97*  CO2 32 31  GLUCOSE 124* 112*  BUN 37* 47*  CREATININE 3.61* 3.87*  CALCIUM 8.5* 8.7*   No results for input(s): PTH in the last 72 hours. Iron Studies: No results for input(s): IRON, TIBC, TRANSFERRIN, FERRITIN in the last 72 hours.  Studies/Results: No results found.  I have reviewed the patient's current medications.  Assessment/Plan: 1 AKI vol improving, diuresing with Lasix.  Cr slowly rising but acceptable 2 Anemia stable on esa 3 CAD 4 DM contolled 5 COPD P lasix, follow Cr , if cont to rise will need HD    LOS: 16 days   Lakshmi Sundeen L 07/11/2016,10:07 AM

## 2016-07-11 NOTE — Progress Notes (Signed)
Physical Therapy Treatment Patient Details Name: Debbie [name] MRN: [id number] DOB: [birth year] Today's Date: 07/11/2016    History of Present Illness Pt is an 83 y/o female admitted from a SNF secondary to dyspnea and hyperkalemia. 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    PT Comments    Improving each session.  Slow pushing the ambulated distance further and standing to rest when she fatigues, but not sitting.   Follow Up Recommendations  Home health PT;Supervision/Assistance - 24 hour     Equipment Recommendations  3in1 (PT)    Recommendations for Other Services       Precautions / Restrictions Precautions Precautions: Fall Precaution Comments: supplemental O2 dependent; monitor SPO2    Mobility  Bed Mobility Overal bed mobility: Needs Assistance Bed Mobility: Sit to Supine     Supine to sit: Min assist     General bed mobility comments: min assist up to  L elbow, then no more assist needed to get up and scoot to EOB  Transfers Overall transfer level: Needs assistance   Transfers: Sit to/from Stand Sit to Stand: Min assist         General transfer comment: cues for hand placement and stability assist, not boost assist today  Ambulation/Gait Ambulation/Gait assistance: Min guard Ambulation Distance (Feet): 260 Feet (with standing rest at 130 feet) Assistive device: Rolling walker (2 wheeled) Gait Pattern/deviations: Step-through pattern Gait velocity: slower Gait velocity interpretation: Below normal speed for age/gender General Gait Details: generally steady, visibly fatigued, but able to stand and recover.   Stairs            Wheelchair Mobility    Modified Rankin (Stroke Patients Only)       Balance     Sitting balance-Leahy Scale: Fair       Standing balance-Leahy Scale: Poor Standing  balance comment: reliant on the RW                    Cognition Arousal/Alertness: Awake/alert Behavior During Therapy: WFL for tasks assessed/performed Overall Cognitive Status: Within Functional Limits for tasks assessed                      Exercises General Exercises - Lower Extremity Long Arc Quad: AROM;Both;15 reps;Seated Hip Flexion/Marching: AROM;Both;10 reps;Seated    General Comments General comments (skin integrity, edema, etc.): sats on 3L at 96% at 88 bpm      Pertinent Vitals/Pain Pain Assessment: Faces Faces Pain Scale: No hurt    Home Living                      Prior Function            PT Goals (current goals can now be found in the care plan section) Acute Rehab PT Goals Patient Stated Goal: home PT Goal Formulation: With patient/family Time For Goal Achievement: 07/12/16 Potential to Achieve Goals: Fair Progress towards PT goals: Progressing toward goals    Frequency    Min 3X/week      PT Plan Current plan remains appropriate    Co-evaluation             End of Session Equipment Utilized During Treatment: Oxygen Activity Tolerance: Patient tolerated treatment well Patient left: in chair;with call bell/phone within reach;with chair alarm set     Time: [id number] PT Time  Calculation (min) (ACUTE ONLY): 27 min  Charges:  $Gait Training: 8-22 mins $Therapeutic Exercise: 8-22 mins                    G Codes:      Tessie Fass Emmanuelle Hibbitts 07/11/2016, 4:53 PM 07/11/2016  Donnella Sham, [place] [phone]  (pager)

## 2016-07-11 NOTE — Progress Notes (Signed)
PROGRESS NOTE  Debbie [name]  [id number] DOB: 18-Mar-1931 DOA: 06/25/2016 PCP: Myriam Jacobson, MD Outpatient Specialists:  Subjective: Help her to get to her breakfast this morning, complaining about weakness. Creatinine is slowly rising, 3.8 today. Made 1.6 L urine  Brief Narrative:  83 year old female with a past medical history of COPD, oxygen dependent, diastolic CHF, diabetes, chronic kidney disease, presented with shortness of breath and was found to have low oxygen levels and low blood pressure. She was found to have acute on chronic kidney disease. Initially admitted to step down then had to be transferred to ICU due to hypotension. Started on hemodialysis. She was stabilized and then transferred to the floor. Patient started on high-dose Lasix. Continues to require dialysis on and off.  Assessment & Plan:   Active Problems:   Renal failure   AKI (acute kidney injury) ([place])   Hyperkalemia   Atrial fibrillation (HCC)   Bradycardia   Central venous catheter in place   Acute kidney injury on chronic kidney disease, stage 3 -Baseline creatinine is 2.1 from 1/5, presented with creatinine of 6.9. -Initially admitted to the ICU because of hypotension started on CRRT. -Nephrology following now on intermittent dialysis, last HD was on 1/29. -Continue current dose of Lasix per nephrology, follow urine output closely. -Follow urine output and creatinine, creatinine today 3.8.  Acute on chronic hypoxic resp failure -Improved. Continues to need oxygen, which apparently she uses at home as well. Volume being managed with diuretics and dialysis.  Biateral pleural effusion -Likely due to fluid overload. On x-rays also reviewed. Patient has had pleural effusions since December.  -Bilateral effusion, likely thoracentesis is not going to help, this is likely will improve with diuresis.  Chronic Diastolic heart failure -This is based off of an echocardiogram from December.  Previously on torsemide.  -She is behind on a high dose of Lasix, nephrology is controlling the Gwyndolyn Saxon.  Episodic bradycardia -Thought to be secondary to Hyperkalemia versus beta blocker. Resolved.  Recent H flu +/-HCAP -Stable  History of COPD  -Stable. Apparently on home oxygen. Continue home medications. No wheezing noted. Continue current medications.  Anemia of chronic disease -Hemoglobin is low but stable. No evidence for overt bleeding. Low iron and ferritin levels noted. Parenteral Iron was given.   History of Endometrial adenocarcinoma status post XRT -Stable   DVT prophylaxis: Subcutaneous heparin Code Status: Full Code Family Communication:  Disposition Plan: Await creatinine stabilization and reliable urine output Diet: Diet Carb Modified Fluid consistency: Thin; Room service appropriate? Yes; Fluid restriction: 1200 mL Fluid  Consultants:   Nephrology  Procedures:   Dialysis  Antimicrobials:   None  Objective: Vitals:   07/11/16 0500 07/11/16 0859 07/11/16 0901 07/11/16 0946  BP: (!) 109/53   (!) 100/32  Pulse: 68   62  Resp: 20   18  Temp: 97.7 F (36.5 C)   98.6 F (37 C)  TempSrc: Oral   Oral  SpO2: 99% 97% 97% 100%  Weight:      Height:        Intake/Output Summary (Last 24 hours) at 07/11/16 1239 Last data filed at 07/11/16 0900  Gross per 24 hour  Intake              720 ml  Output             1350 ml  Net             -630 ml   Filed Weights   07/08/16  [id number] 07/08/16 2032 07/10/16 2100  Weight: 71.7 kg (158 lb) 71.9 kg (158 lb 8.2 oz) 74.4 kg (164 lb)    Examination: General exam: Appears calm and comfortable  Respiratory system: Clear to auscultation. Respiratory effort normal. Cardiovascular system: S1 & S2 heard, RRR. No JVD, murmurs, rubs, gallops or clicks. No pedal edema. Gastrointestinal system: Abdomen is nondistended, soft and nontender. No organomegaly or masses felt. Normal bowel sounds heard. Central nervous  system: Alert and oriented. No focal neurological deficits. Extremities: Symmetric 5 x 5 power. Skin: No rashes, lesions or ulcers Psychiatry: Judgement and insight appear normal. Mood & affect appropriate.   Data Reviewed: I have personally reviewed following labs and imaging studies  CBC:  Recent Labs Lab 07/07/16 0506 07/11/16 0447  WBC 6.5 6.1  HGB 7.7* 7.5*  HCT 25.5* 25.1*  MCV 98.8 99.2  PLT 272 [id number]   Basic Metabolic Panel:  Recent Labs Lab 07/07/16 0506 07/08/16 0507 07/09/16 0456 07/10/16 0458 07/11/16 0447  NA 141 140 138 138 138  K 3.5 3.8 3.7 4.1 3.7  CL 101 102 101 101 97*  CO2 31 31 31  32 31  GLUCOSE 80 79 94 124* 112*  BUN 21* 10 24* 37* 47*  CREATININE 3.17* 2.14* 3.02* 3.61* 3.87*  CALCIUM 8.5* 8.2* 8.3* 8.5* 8.7*  PHOS 2.5 1.6* 1.8* 1.9* 1.9*   GFR: Estimated Creatinine Clearance: 10.9 mL/min (by C-G formula based on SCr of 3.87 mg/dL (H)). Liver Function Tests:  Recent Labs Lab 07/07/16 0506 07/08/16 0507 07/09/16 0456 07/10/16 0458 07/11/16 0447  ALBUMIN 2.3* 2.2* 2.1* 2.2* 2.2*   No results for input(s): LIPASE, AMYLASE in the last 168 hours. No results for input(s): AMMONIA in the last 168 hours. Coagulation Profile: No results for input(s): INR, PROTIME in the last 168 hours. Cardiac Enzymes: No results for input(s): CKTOTAL, CKMB, CKMBINDEX, TROPONINI in the last 168 hours. BNP (last 3 results) No results for input(s): PROBNP in the last 8760 hours. HbA1C: No results for input(s): HGBA1C in the last 72 hours. CBG:  Recent Labs Lab 07/10/16 1213 07/10/16 1721 07/10/16 2130 07/11/16 0758 07/11/16 1202  GLUCAP 140* 122* 166* 107* 201*   Lipid Profile: No results for input(s): CHOL, HDL, LDLCALC, TRIG, CHOLHDL, LDLDIRECT in the last 72 hours. Thyroid Function Tests: No results for input(s): TSH, T4TOTAL, FREET4, T3FREE, THYROIDAB in the last 72 hours. Anemia Panel: No results for input(s): VITAMINB12, FOLATE, FERRITIN,  TIBC, IRON, RETICCTPCT in the last 72 hours. Urine analysis:    Component Value Date/Time   COLORURINE YELLOW 06/26/2016 0028   APPEARANCEUR CLOUDY (A) 06/26/2016 0028   LABSPEC 1.013 06/26/2016 0028   PHURINE 5.0 06/26/2016 0028   GLUCOSEU 50 (A) 06/26/2016 0028   HGBUR NEGATIVE 06/26/2016 0028   BILIRUBINUR NEGATIVE 06/26/2016 0028   KETONESUR NEGATIVE 06/26/2016 0028   PROTEINUR 100 (A) 06/26/2016 0028   UROBILINOGEN 1.0 03/07/2014 1510   NITRITE NEGATIVE 06/26/2016 0028   LEUKOCYTESUR TRACE (A) 06/26/2016 0028   Sepsis Labs: @LABRCNTIP (procalcitonin:4,lacticidven:4)  )No results found for this or any previous visit (from the past 240 hour(s)).   Invalid input(s): PROCALCITONIN, [place]   Radiology Studies: No results found.      Scheduled Meds: . arformoterol  15 mcg Nebulization BID  . aspirin EC  81 mg Oral Daily  . atorvastatin  20 mg Oral q1800  . budesonide (PULMICORT) nebulizer solution  0.5 mg Nebulization BID  . darbepoetin (ARANESP) injection - NON-DIALYSIS  150 mcg Subcutaneous Q Wed-1800  .  feeding supplement (NEPRO CARB STEADY)  237 mL Oral TID BM  . furosemide  160 mg Oral TID  . guaiFENesin  600 mg Oral BID  . heparin  5,000 Units Subcutaneous Q8H  . insulin aspart  0-9 Units Subcutaneous TID WC  . metolazone  10 mg Oral Daily  . multivitamin with minerals  1 tablet Oral Daily  . sodium chloride  500 mL Intravenous Once  . sodium chloride flush  10-40 mL Intracatheter Q12H   Continuous Infusions:   LOS: 16 days    Time spent: 35 minutes    Machael Raine A, MD [facility] Pager [phone]  If 7PM-7AM, please contact night-coverage [email or web address] Password [facility] 07/11/2016, 12:39 PM

## 2016-07-11 NOTE — Care Management Important Message (Signed)
Important Message  Patient Details  Name: Debbie [name] MRN: [id number] Date of Birth: 1930-09-22   Medicare Important Message Given:  Yes    Orbie Pyo 07/11/2016, 11:58 AM

## 2016-07-12 ENCOUNTER — Inpatient Hospital Stay (HOSPITAL_COMMUNITY): Payer: Medicare HMO

## 2016-07-12 LAB — RENAL FUNCTION PANEL
ANION GAP: 10 (ref 5–15)
Albumin: 2.3 g/dL — ABNORMAL LOW (ref 3.5–5.0)
BUN: 60 mg/dL — ABNORMAL HIGH (ref 6–20)
CALCIUM: 9.1 mg/dL (ref 8.9–10.3)
CHLORIDE: 96 mmol/L — AB (ref 101–111)
CO2: 33 mmol/L — AB (ref 22–32)
Creatinine, Ser: 4.22 mg/dL — ABNORMAL HIGH (ref 0.44–1.00)
GFR, EST AFRICAN AMERICAN: 10 mL/min — AB (ref >60)
GFR, EST NON AFRICAN AMERICAN: 9 mL/min — AB (ref >60)
Glucose, Bld: 106 mg/dL — ABNORMAL HIGH (ref 65–99)
Phosphorus: 1.7 mg/dL — ABNORMAL LOW (ref 2.5–4.6)
Potassium: 3.7 mmol/L (ref 3.5–5.1)
Sodium: 139 mmol/L (ref 135–145)

## 2016-07-12 LAB — GLUCOSE, CAPILLARY
GLUCOSE-CAPILLARY: 110 mg/dL — AB (ref 65–99)
GLUCOSE-CAPILLARY: 134 mg/dL — AB (ref 65–99)
Glucose-Capillary: 125 mg/dL — ABNORMAL HIGH (ref 65–99)
Glucose-Capillary: 161 mg/dL — ABNORMAL HIGH (ref 65–99)

## 2016-07-12 LAB — MRSA PCR SCREENING: MRSA by PCR: POSITIVE — AB

## 2016-07-12 MED ORDER — CHLORHEXIDINE GLUCONATE CLOTH 2 % EX PADS
6.0000 | MEDICATED_PAD | Freq: Every day | CUTANEOUS | Status: AC
  Administered 2016-07-12 – 2016-07-16 (×5): 6 via TOPICAL

## 2016-07-12 MED ORDER — FUROSEMIDE 80 MG PO TABS
160.0000 mg | ORAL_TABLET | Freq: Two times a day (BID) | ORAL | Status: DC
  Administered 2016-07-12 – 2016-07-16 (×8): 160 mg via ORAL
  Filled 2016-07-12 (×8): qty 2

## 2016-07-12 MED ORDER — MUPIROCIN 2 % EX OINT
1.0000 "application " | TOPICAL_OINTMENT | Freq: Two times a day (BID) | CUTANEOUS | Status: AC
  Administered 2016-07-12 – 2016-07-16 (×10): 1 via NASAL
  Filled 2016-07-12 (×2): qty 22

## 2016-07-12 NOTE — Progress Notes (Signed)
Subjective: Interval History: has no complaint, feeling better, getting up.  Objective: Vital signs in last 24 hours: Temp:  [97.4 F (36.3 C)-98.6 F (37 C)] 97.4 F (36.3 C) (02/03 0625) Pulse Rate:  [60-93] 64 (02/03 0625) Resp:  [16-18] 16 (02/03 0625) BP: (100-136)/(32-57) 130/56 (02/03 0625) SpO2:  [96 %-100 %] 100 % (02/03 0625) Weight:  [74.9 kg (165 lb 1.6 oz)] 74.9 kg (165 lb 1.6 oz) (02/02 2141) Weight change: 0.499 kg (1 lb 1.6 oz)  Intake/Output from previous day: 02/02 0701 - 02/03 0700 In: 840 [P.O.:840] Out: 1725 [Urine:1725] Intake/Output this shift: No intake/output data recorded.  General appearance: alert, cooperative, no distress, mildly obese and pale Neck: RIJ cath Resp: rales bibasilar Cardio: S1, S2 normal and systolic murmur: systolic ejection 2/6, decrescendo at 2nd left intercostal space GI: soft, liver down 5 cm, pos bs Extremities: edema 2-3+  Lab Results:  Recent Labs  07/11/16 0447  WBC 6.1  HGB 7.5*  HCT 25.1*  PLT 269   BMET:  Recent Labs  07/11/16 0447 07/12/16 0433  NA 138 139  K 3.7 3.7  CL 97* 96*  CO2 31 33*  GLUCOSE 112* 106*  BUN 47* 60*  CREATININE 3.87* 4.22*  CALCIUM 8.7* 9.1   No results for input(s): PTH in the last 72 hours. Iron Studies: No results for input(s): IRON, TIBC, TRANSFERRIN, FERRITIN in the last 72 hours.  Studies/Results: No results found.  I have reviewed the patient's current medications.  Assessment/Plan: 1 AKI Cr cont to rise indic minimal function, but does have some 2 CHF still vol xs, check CXR 3 DM controlled 4 Anemia esa 5 debill 6 COPD P CXR, lower diuretics    LOS: 17 days   Sarp Vernier L 07/12/2016,9:27 AM

## 2016-07-12 NOTE — Progress Notes (Signed)
CRITICAL VALUE ALERT  Critical value received:  MRSA nasal swab = positive  Date of notification:  07-12-2016  Time of notification:  08:00  Critical value read back:Yes.    Nurse who received alert:  Genelle Bal, RN  MD notified:  Hartford Poli

## 2016-07-12 NOTE — Progress Notes (Signed)
PROGRESS NOTE  Debbie [name]  [id number] DOB: 09/03/1930 DOA: 06/25/2016 PCP: Myriam Jacobson, MD Outpatient Specialists:  Subjective: She denies any complaints, started to eat more. Creatinine increased 4.2, urine output 1.7 L for yesterday.  Brief Narrative:  83 year old female with a past medical history of COPD, oxygen dependent, diastolic CHF, diabetes, chronic kidney disease, presented with shortness of breath and was found to have low oxygen levels and low blood pressure. She was found to have acute on chronic kidney disease. Initially admitted to step down then had to be transferred to ICU due to hypotension. Started on hemodialysis. She was stabilized and then transferred to the floor. Patient started on high-dose Lasix. Continues to require dialysis on and off.  Assessment & Plan:   Active Problems:   Renal failure   AKI (acute kidney injury) ([place])   Hyperkalemia   Atrial fibrillation (HCC)   Bradycardia   Central venous catheter in place   Acute kidney injury on chronic kidney disease, stage 3 -Baseline creatinine is 2.1 from 1/5, presented with creatinine of 6.9. -Initially admitted to the ICU because of hypotension started on CRRT. -Nephrology following now on intermittent dialysis, last HD was on 1/29. -Seems dose change 160 mg twice a day, metolazone discontinued. -Follow urine output and creatinine, creatinine today 4.2 indicating minimal function.  Acute on chronic hypoxic resp failure -Improved. Continues to need oxygen, which apparently she uses at home as well. Volume being managed with diuretics and dialysis.  Biateral pleural effusion -Likely due to fluid overload. On x-rays also reviewed. Patient has had pleural effusions since December.  -Bilateral effusion, likely thoracentesis is not going to help, this is likely will improve with diuresis.  Chronic Diastolic heart failure -This is based off of an echocardiogram from December.  Previously on torsemide.  -She is behind on a high dose of Lasix, nephrology is controlling the Gwyndolyn Saxon.  Episodic bradycardia -Thought to be secondary to Hyperkalemia versus beta blocker. Resolved.  Recent H flu +/-HCAP -Stable  History of COPD  -Stable. Apparently on home oxygen. Continue home medications. No wheezing noted. Continue current medications.  Anemia of chronic disease -Hemoglobin is low but stable. No evidence for overt bleeding. Low iron and ferritin levels noted. Parenteral Iron was given.   History of Endometrial adenocarcinoma status post XRT -Stable   DVT prophylaxis: Subcutaneous heparin Code Status: Full Code Family Communication:  Disposition Plan: Await creatinine stabilization and reliable urine output Diet: Diet Carb Modified Fluid consistency: Thin; Room service appropriate? Yes; Fluid restriction: 1200 mL Fluid  Consultants:   Nephrology  Procedures:   Dialysis  Antimicrobials:   None  Objective: Vitals:   07/11/16 2141 07/12/16 0625 07/12/16 0941 07/12/16 1030  BP: (!) 119/57 (!) 130/56  (!) 105/54  Pulse: 60 64  71  Resp: 16 16  16   Temp: 98.1 F (36.7 C) 97.4 F (36.3 C)  98.1 F (36.7 C)  TempSrc: Oral Oral  Oral  SpO2: 100% 100% 100% 100%  Weight: 74.9 kg (165 lb 1.6 oz)     Height:        Intake/Output Summary (Last 24 hours) at 07/12/16 1216 Last data filed at 07/12/16 0900  Gross per 24 hour  Intake              840 ml  Output             1725 ml  Net             -885 ml  Filed Weights   07/08/16 2032 07/10/16 2100 07/11/16 2141  Weight: 71.9 kg (158 lb 8.2 oz) 74.4 kg (164 lb) 74.9 kg (165 lb 1.6 oz)    Examination: General exam: Appears calm and comfortable  Respiratory system: Clear to auscultation. Respiratory effort normal. Cardiovascular system: S1 & S2 heard, RRR. No JVD, murmurs, rubs, gallops or clicks. No pedal edema. Gastrointestinal system: Abdomen is nondistended, soft and nontender. No  organomegaly or masses felt. Normal bowel sounds heard. Central nervous system: Alert and oriented. No focal neurological deficits. Extremities: Symmetric 5 x 5 power. Skin: No rashes, lesions or ulcers Psychiatry: Judgement and insight appear normal. Mood & affect appropriate.   Data Reviewed: I have personally reviewed following labs and imaging studies  CBC:  Recent Labs Lab 07/07/16 0506 07/11/16 0447  WBC 6.5 6.1  HGB 7.7* 7.5*  HCT 25.5* 25.1*  MCV 98.8 99.2  PLT 272 [id number]   Basic Metabolic Panel:  Recent Labs Lab 07/08/16 0507 07/09/16 0456 07/10/16 0458 07/11/16 0447 07/12/16 0433  NA 140 138 138 138 139  K 3.8 3.7 4.1 3.7 3.7  CL 102 101 101 97* 96*  CO2 31 31 32 31 33*  GLUCOSE 79 94 124* 112* 106*  BUN 10 24* 37* 47* 60*  CREATININE 2.14* 3.02* 3.61* 3.87* 4.22*  CALCIUM 8.2* 8.3* 8.5* 8.7* 9.1  PHOS 1.6* 1.8* 1.9* 1.9* 1.7*   GFR: Estimated Creatinine Clearance: 10.1 mL/min (by C-G formula based on SCr of 4.22 mg/dL (H)). Liver Function Tests:  Recent Labs Lab 07/08/16 0507 07/09/16 0456 07/10/16 0458 07/11/16 0447 07/12/16 0433  ALBUMIN 2.2* 2.1* 2.2* 2.2* 2.3*   No results for input(s): LIPASE, AMYLASE in the last 168 hours. No results for input(s): AMMONIA in the last 168 hours. Coagulation Profile: No results for input(s): INR, PROTIME in the last 168 hours. Cardiac Enzymes: No results for input(s): CKTOTAL, CKMB, CKMBINDEX, TROPONINI in the last 168 hours. BNP (last 3 results) No results for input(s): PROBNP in the last 8760 hours. HbA1C: No results for input(s): HGBA1C in the last 72 hours. CBG:  Recent Labs Lab 07/11/16 0758 07/11/16 1202 07/11/16 1723 07/11/16 2205 07/12/16 0801  GLUCAP 107* 201* 124* 139* 110*   Lipid Profile: No results for input(s): CHOL, HDL, LDLCALC, TRIG, CHOLHDL, LDLDIRECT in the last 72 hours. Thyroid Function Tests: No results for input(s): TSH, T4TOTAL, FREET4, T3FREE, THYROIDAB in the last 72  hours. Anemia Panel: No results for input(s): VITAMINB12, FOLATE, FERRITIN, TIBC, IRON, RETICCTPCT in the last 72 hours. Urine analysis:    Component Value Date/Time   COLORURINE YELLOW 06/26/2016 0028   APPEARANCEUR CLOUDY (A) 06/26/2016 0028   LABSPEC 1.013 06/26/2016 0028   PHURINE 5.0 06/26/2016 0028   GLUCOSEU 50 (A) 06/26/2016 0028   HGBUR NEGATIVE 06/26/2016 0028   BILIRUBINUR NEGATIVE 06/26/2016 0028   KETONESUR NEGATIVE 06/26/2016 0028   PROTEINUR 100 (A) 06/26/2016 0028   UROBILINOGEN 1.0 03/07/2014 1510   NITRITE NEGATIVE 06/26/2016 0028   LEUKOCYTESUR TRACE (A) 06/26/2016 0028   Sepsis Labs: @LABRCNTIP (procalcitonin:4,lacticidven:4)  ) Recent Results (from the past 240 hour(s))  MRSA PCR Screening     Status: Abnormal   Collection Time: 07/12/16  6:15 AM  Result Value Ref Range Status   MRSA by PCR POSITIVE (A) NEGATIVE Final    Comment:        The GeneXpert MRSA Assay (FDA approved for NASAL specimens only), is one component of a comprehensive MRSA colonization surveillance program. It is not intended to  diagnose MRSA infection nor to guide or monitor treatment for MRSA infections. RESULT CALLED TO, READ BACK BY AND VERIFIED WITH: A. BRAKE 0759 02.03.2018 N. MORRIS      Invalid input(s): PROCALCITONIN, LACTICACIDVEN   Radiology Studies: No results found.      Scheduled Meds: . arformoterol  15 mcg Nebulization BID  . aspirin EC  81 mg Oral Daily  . atorvastatin  20 mg Oral q1800  . budesonide (PULMICORT) nebulizer solution  0.5 mg Nebulization BID  . Chlorhexidine Gluconate Cloth  6 each Topical Q0600  . darbepoetin (ARANESP) injection - NON-DIALYSIS  150 mcg Subcutaneous Q Wed-1800  . feeding supplement (NEPRO CARB STEADY)  237 mL Oral TID BM  . furosemide  160 mg Oral BID  . guaiFENesin  600 mg Oral BID  . heparin  5,000 Units Subcutaneous Q8H  . insulin aspart  0-9 Units Subcutaneous TID WC  . multivitamin with minerals  1 tablet Oral  Daily  . mupirocin ointment  1 application Nasal BID  . sodium chloride  500 mL Intravenous Once  . sodium chloride flush  10-40 mL Intracatheter Q12H   Continuous Infusions:   LOS: 17 days    Time spent: 35 minutes    Pricila Bridge A, MD [facility] Pager [phone]  If 7PM-7AM, please contact night-coverage [email or web address] Password [email or web address] 07/12/2016, 12:16 PM

## 2016-07-13 LAB — GLUCOSE, CAPILLARY
GLUCOSE-CAPILLARY: 137 mg/dL — AB (ref 65–99)
GLUCOSE-CAPILLARY: 120 mg/dL — AB (ref 65–99)
Glucose-Capillary: 114 mg/dL — ABNORMAL HIGH (ref 65–99)
Glucose-Capillary: 190 mg/dL — ABNORMAL HIGH (ref 65–99)

## 2016-07-13 LAB — RENAL FUNCTION PANEL
Albumin: 2.2 g/dL — ABNORMAL LOW (ref 3.5–5.0)
Anion gap: 10 (ref 5–15)
BUN: 74 mg/dL — ABNORMAL HIGH (ref 6–20)
CHLORIDE: 93 mmol/L — AB (ref 101–111)
CO2: 34 mmol/L — AB (ref 22–32)
Calcium: 9 mg/dL (ref 8.9–10.3)
Creatinine, Ser: 4.32 mg/dL — ABNORMAL HIGH (ref 0.44–1.00)
GFR calc non Af Amer: 9 mL/min — ABNORMAL LOW (ref >60)
GFR, EST AFRICAN AMERICAN: 10 mL/min — AB (ref >60)
GLUCOSE: 110 mg/dL — AB (ref 65–99)
Phosphorus: 1.5 mg/dL — ABNORMAL LOW (ref 2.5–4.6)
Potassium: 3.8 mmol/L (ref 3.5–5.1)
Sodium: 137 mmol/L (ref 135–145)

## 2016-07-13 MED ORDER — ZOLPIDEM TARTRATE 5 MG PO TABS
5.0000 mg | ORAL_TABLET | Freq: Once | ORAL | Status: AC
Start: 2016-07-13 — End: 2016-07-13
  Administered 2016-07-13: 5 mg via ORAL
  Filled 2016-07-13: qty 1

## 2016-07-13 NOTE — Progress Notes (Signed)
Subjective: Interval History: has no complaint , still on O2.  Objective: Vital signs in last 24 hours: Temp:  [97.6 F (36.4 C)-98.5 F (36.9 C)] 97.6 F (36.4 C) (02/04 0628) Pulse Rate:  [67-71] 71 (02/04 0628) Resp:  [16] 16 (02/04 0628) BP: (104-125)/(49-56) 125/56 (02/04 0628) SpO2:  [95 %-100 %] 100 % (02/04 0628) Weight:  [75.6 kg (166 lb 9.6 oz)] 75.6 kg (166 lb 9.6 oz) (02/03 2038) Weight change: 0.68 kg (1 lb 8 oz)  Intake/Output from previous day: 02/03 0701 - 02/04 0700 In: 1157 [P.O.:1157] Out: 1550 [Urine:1550] Intake/Output this shift: Total I/O In: 437 [P.O.:437] Out: 1000 [Urine:1000]  General appearance: alert, cooperative, no distress, mildly obese and pale Neck: RIJ cath Resp: R I cath Cardio: S1, S2 normal and systolic murmur: systolic ejection 2/6, decrescendo at 2nd left intercostal space GI: pos bs, liver down 6 cm, soft Extremities: edema 2-3+  Lab Results:  Recent Labs  07/11/16 0447  WBC 6.1  HGB 7.5*  HCT 25.1*  PLT 269   BMET:  Recent Labs  07/12/16 0433 07/13/16 0405  NA 139 137  K 3.7 3.8  CL 96* 93*  CO2 33* 34*  GLUCOSE 106* 110*  BUN 60* 74*  CREATININE 4.22* 4.32*  CALCIUM 9.1 9.0   No results for input(s): PTH in the last 72 hours. Iron Studies: No results for input(s): IRON, TIBC, TRANSFERRIN, FERRITIN in the last 72 hours.  Studies/Results: Dg Chest 2 View  Result Date: 07/12/2016 CLINICAL DATA:  Pt c/o productive cough for 1 week. Pt denies chest pain or any pain at this time. Hx COPD, HTN, Diabetes, nonsmoker. EXAM: CHEST  2 VIEW COMPARISON:  07/07/2016 FINDINGS: Right IJ dialysis catheter tip overlies the superior vena cava. The heart is mildly enlarged. There are bibasilar opacities which obscure the hemidiaphragms and are associated bilateral pleural effusions. Interstitial edema suspected. There has been improvement in aeration over recent studies. IMPRESSION: 1. Cardiomegaly and bilateral pleural effusions are  stable. 2. Improvement in aeration ; persistent interstitial edema. Electronically Signed   By: Nolon Nations M.D.   On: 07/12/2016 16:34    I have reviewed the patient's current medications.  Assessment/Plan: 1 AKI nonoliguric but not great response to diuretics.  Progress with vol but slow.   Still effusions and vol xs on CXR. 2 Anemia esa/Fe 3 CAD 4 DM 5 Debill  P cont Lasix, if worsens furthur , HD/, cont esa    LOS: 18 days   Adilynne Fitzwater L 07/13/2016,6:52 AM

## 2016-07-13 NOTE — Progress Notes (Signed)
PROGRESS NOTE  Debbie [name]  [id number] DOB: [birth year] DOA: 06/25/2016 PCP: Myriam Jacobson, MD Outpatient Specialists:  Subjective: No complaints this morning, asked nursing staff to wean off of oxygen.  Brief Narrative:  83 year old female with a past medical history of COPD, oxygen dependent, diastolic CHF, diabetes, chronic kidney disease, presented with shortness of breath and was found to have low oxygen levels and low blood pressure. She was found to have acute on chronic kidney disease. Initially admitted to step down then had to be transferred to ICU due to hypotension. Started on hemodialysis. She was stabilized and then transferred to the floor. Patient started on high-dose Lasix. Continues to require dialysis on and off.  Assessment & Plan:   Active Problems:   Renal failure   AKI (acute kidney injury) ([place])   Hyperkalemia   Atrial fibrillation ([facility])   Bradycardia   Central venous catheter in place   Acute kidney injury on chronic kidney disease, stage 3 -Baseline creatinine is 2.1 from 1/5, presented with creatinine of 6.9. -Initially admitted to the ICU because of hypotension started on CRRT. -Nephrology following now on intermittent dialysis, last HD was on 1/29. -Continue Lasix dose of 160 mg twice a day -Creatinine relatively stable at 4.3, continue to follow urine output and BMP in a.m.  Acute on chronic hypoxic resp failure -From reviewing the notes patient started on oxygen when she was discharged from the hospital on Christmas of 2017 -Currently sats 100% on 2 L try to wean off of oxygen  Biateral pleural effusion -Likely due to fluid overload. On x-rays also reviewed. Patient has had pleural effusions since December.  -Bilateral effusion, likely thoracentesis is not going to help, this is likely will improve with diuresis.  Chronic Diastolic heart failure -This is based off of an echocardiogram from December. Previously on torsemide.    -She is behind on a high dose of Lasix, nephrology is controlling the Gwyndolyn Saxon.  Episodic bradycardia -Thought to be secondary to Hyperkalemia versus beta blocker. Resolved.  Recent H flu +/-HCAP -Stable  History of COPD  -Stable. Apparently on home oxygen. Continue home medications. No wheezing noted. Continue current medications.  Anemia of chronic disease -Hemoglobin is low but stable. No evidence for overt bleeding. Low iron and ferritin levels noted. Parenteral Iron was given.   History of Endometrial adenocarcinoma status post XRT -Stable, just finished XRT with Dr. Sondra Come on 05/22/16.   DVT prophylaxis: Subcutaneous heparin Code Status: Full Code Family Communication:  Disposition Plan: Await creatinine stabilization and reliable urine output Diet: Diet Carb Modified Fluid consistency: Thin; Room service appropriate? Yes; Fluid restriction: 1200 mL Fluid  Consultants:   Nephrology  Procedures:   Dialysis  Antimicrobials:   None  Objective: Vitals:   07/12/16 2038 07/13/16 0628 07/13/16 0852 07/13/16 1000  BP:  (!) 125/56  (!) 121/49  Pulse:  71  74  Resp:  16  16  Temp:  97.6 F (36.4 C)  98.1 F (36.7 C)  TempSrc:  Oral  Oral  SpO2:  100% 98% 100%  Weight: 75.6 kg (166 lb 9.6 oz)     Height:        Intake/Output Summary (Last 24 hours) at 07/13/16 1207 Last data filed at 07/13/16 1000  Gross per 24 hour  Intake             1157 ml  Output             1800 ml  Net             -  643 ml   Filed Weights   07/10/16 2100 07/11/16 2141 07/12/16 2038  Weight: 74.4 kg (164 lb) 74.9 kg (165 lb 1.6 oz) 75.6 kg (166 lb 9.6 oz)    Examination: General exam: Appears calm and comfortable  Respiratory system: Clear to auscultation. Respiratory effort normal. Cardiovascular system: S1 & S2 heard, RRR. No JVD, murmurs, rubs, gallops or clicks. No pedal edema. Gastrointestinal system: Abdomen is nondistended, soft and nontender. No organomegaly or masses  felt. Normal bowel sounds heard. Central nervous system: Alert and oriented. No focal neurological deficits. Extremities: Symmetric 5 x 5 power. Skin: No rashes, lesions or ulcers Psychiatry: Judgement and insight appear normal. Mood & affect appropriate.   Data Reviewed: I have personally reviewed following labs and imaging studies  CBC:  Recent Labs Lab 07/07/16 0506 07/11/16 0447  WBC 6.5 6.1  HGB 7.7* 7.5*  HCT 25.5* 25.1*  MCV 98.8 99.2  PLT 272 [id number]   Basic Metabolic Panel:  Recent Labs Lab 07/09/16 0456 07/10/16 0458 07/11/16 0447 07/12/16 0433 07/13/16 0405  NA 138 138 138 139 137  K 3.7 4.1 3.7 3.7 3.8  CL 101 101 97* 96* 93*  CO2 31 32 31 33* 34*  GLUCOSE 94 124* 112* 106* 110*  BUN 24* 37* 47* 60* 74*  CREATININE 3.02* 3.61* 3.87* 4.22* 4.32*  CALCIUM 8.3* 8.5* 8.7* 9.1 9.0  PHOS 1.8* 1.9* 1.9* 1.7* 1.5*   GFR: Estimated Creatinine Clearance: 9.9 mL/min (by C-G formula based on SCr of 4.32 mg/dL (H)). Liver Function Tests:  Recent Labs Lab 07/09/16 0456 07/10/16 0458 07/11/16 0447 07/12/16 0433 07/13/16 0405  ALBUMIN 2.1* 2.2* 2.2* 2.3* 2.2*   No results for input(s): LIPASE, AMYLASE in the last 168 hours. No results for input(s): AMMONIA in the last 168 hours. Coagulation Profile: No results for input(s): INR, PROTIME in the last 168 hours. Cardiac Enzymes: No results for input(s): CKTOTAL, CKMB, CKMBINDEX, TROPONINI in the last 168 hours. BNP (last 3 results) No results for input(s): PROBNP in the last 8760 hours. HbA1C: No results for input(s): HGBA1C in the last 72 hours. CBG:  Recent Labs Lab 07/12/16 0801 07/12/16 1218 07/12/16 1702 07/12/16 2035 07/13/16 0759  GLUCAP 110* 134* 125* 161* 114*   Lipid Profile: No results for input(s): CHOL, HDL, LDLCALC, TRIG, CHOLHDL, LDLDIRECT in the last 72 hours. Thyroid Function Tests: No results for input(s): TSH, T4TOTAL, FREET4, T3FREE, THYROIDAB in the last 72 hours. Anemia  Panel: No results for input(s): VITAMINB12, FOLATE, FERRITIN, TIBC, IRON, RETICCTPCT in the last 72 hours. Urine analysis:    Component Value Date/Time   COLORURINE YELLOW 06/26/2016 0028   APPEARANCEUR CLOUDY (A) 06/26/2016 0028   LABSPEC 1.013 06/26/2016 0028   PHURINE 5.0 06/26/2016 0028   GLUCOSEU 50 (A) 06/26/2016 0028   HGBUR NEGATIVE 06/26/2016 0028   BILIRUBINUR NEGATIVE 06/26/2016 0028   KETONESUR NEGATIVE 06/26/2016 0028   PROTEINUR 100 (A) 06/26/2016 0028   UROBILINOGEN 1.0 03/07/2014 1510   NITRITE NEGATIVE 06/26/2016 0028   LEUKOCYTESUR TRACE (A) 06/26/2016 0028   Sepsis Labs: @LABRCNTIP (procalcitonin:4,lacticidven:4)  ) Recent Results (from the past 240 hour(s))  MRSA PCR Screening     Status: Abnormal   Collection Time: 07/12/16  6:15 AM  Result Value Ref Range Status   MRSA by PCR POSITIVE (A) NEGATIVE Final    Comment:        The GeneXpert MRSA Assay (FDA approved for NASAL specimens only), is one component of a comprehensive MRSA colonization surveillance program. It  is not intended to diagnose MRSA infection nor to guide or monitor treatment for MRSA infections. RESULT CALLED TO, READ BACK BY AND VERIFIED WITH: A. BRAKE [id number] 02.03.2018 N. MORRIS      Invalid input(s): PROCALCITONIN, LACTICACIDVEN   Radiology Studies: Dg Chest 2 View  Result Date: 07/12/2016 CLINICAL DATA:  Pt c/o productive cough for 1 week. Pt denies chest pain or any pain at this time. Hx COPD, HTN, Diabetes, nonsmoker. EXAM: CHEST  2 VIEW COMPARISON:  07/07/2016 FINDINGS: Right IJ dialysis catheter tip overlies the superior vena cava. The heart is mildly enlarged. There are bibasilar opacities which obscure the hemidiaphragms and are associated bilateral pleural effusions. Interstitial edema suspected. There has been improvement in aeration over recent studies. IMPRESSION: 1. Cardiomegaly and bilateral pleural effusions are stable. 2. Improvement in aeration ; persistent  interstitial edema. Electronically Signed   By: Nolon Nations M.D.   On: 07/12/2016 16:34        Scheduled Meds: . arformoterol  15 mcg Nebulization BID  . aspirin EC  81 mg Oral Daily  . atorvastatin  20 mg Oral q1800  . budesonide (PULMICORT) nebulizer solution  0.5 mg Nebulization BID  . Chlorhexidine Gluconate Cloth  6 each Topical Q0600  . darbepoetin (ARANESP) injection - NON-DIALYSIS  150 mcg Subcutaneous Q Wed-1800  . feeding supplement (NEPRO CARB STEADY)  237 mL Oral TID BM  . furosemide  160 mg Oral BID  . guaiFENesin  600 mg Oral BID  . heparin  5,000 Units Subcutaneous Q8H  . insulin aspart  0-9 Units Subcutaneous TID WC  . multivitamin with minerals  1 tablet Oral Daily  . mupirocin ointment  1 application Nasal BID  . sodium chloride  500 mL Intravenous Once  . sodium chloride flush  10-40 mL Intracatheter Q12H   Continuous Infusions:   LOS: 18 days    Time spent: 35 minutes    Dora Simeone A, MD [facility] Pager [phone]  If 7PM-7AM, please contact night-coverage [email or web address] Password [email or web address] 07/13/2016, 12:07 PM

## 2016-07-14 LAB — RENAL FUNCTION PANEL
ALBUMIN: 2.4 g/dL — AB (ref 3.5–5.0)
Albumin: 2.4 g/dL — ABNORMAL LOW (ref 3.5–5.0)
Anion gap: 10 (ref 5–15)
Anion gap: 12 (ref 5–15)
BUN: 80 mg/dL — ABNORMAL HIGH (ref 6–20)
BUN: 69 mg/dL — ABNORMAL HIGH (ref 6–20)
CHLORIDE: 91 mmol/L — AB (ref 101–111)
CHLORIDE: 91 mmol/L — AB (ref 101–111)
CO2: 34 mmol/L — ABNORMAL HIGH (ref 22–32)
CO2: 33 mmol/L — AB (ref 22–32)
CREATININE: 4.72 mg/dL — AB (ref 0.44–1.00)
CREATININE: 4.2 mg/dL — AB (ref 0.44–1.00)
Calcium: 8.9 mg/dL (ref 8.9–10.3)
Calcium: 8.5 mg/dL — ABNORMAL LOW (ref 8.9–10.3)
GFR calc Af Amer: 9 mL/min — ABNORMAL LOW (ref >60)
GFR, EST AFRICAN AMERICAN: 10 mL/min — AB (ref >60)
GFR, EST NON AFRICAN AMERICAN: 8 mL/min — AB (ref >60)
GFR, EST NON AFRICAN AMERICAN: 9 mL/min — AB (ref >60)
Glucose, Bld: 115 mg/dL — ABNORMAL HIGH (ref 65–99)
Glucose, Bld: 134 mg/dL — ABNORMAL HIGH (ref 65–99)
POTASSIUM: 3.6 mmol/L (ref 3.5–5.1)
POTASSIUM: 3.8 mmol/L (ref 3.5–5.1)
Phosphorus: 1.8 mg/dL — ABNORMAL LOW (ref 2.5–4.6)
Phosphorus: 1.9 mg/dL — ABNORMAL LOW (ref 2.5–4.6)
Sodium: 135 mmol/L (ref 135–145)
Sodium: 136 mmol/L (ref 135–145)

## 2016-07-14 LAB — CBC
HEMATOCRIT: 23.6 % — AB (ref 36.0–46.0)
HEMATOCRIT: 26.4 % — AB (ref 36.0–46.0)
HEMOGLOBIN: 8 g/dL — AB (ref 12.0–15.0)
Hemoglobin: 7.1 g/dL — ABNORMAL LOW (ref 12.0–15.0)
MCH: 30.2 pg (ref 26.0–34.0)
MCH: 30.1 pg (ref 26.0–34.0)
MCHC: 30.1 g/dL (ref 30.0–36.0)
MCHC: 30.3 g/dL (ref 30.0–36.0)
MCV: 100.4 fL — AB (ref 78.0–100.0)
MCV: 99.2 fL (ref 78.0–100.0)
PLATELETS: 275 10*3/uL (ref 150–400)
Platelets: 274 10*3/uL (ref 150–400)
RBC: 2.35 MIL/uL — ABNORMAL LOW (ref 3.87–5.11)
RBC: 2.66 MIL/uL — AB (ref 3.87–5.11)
RDW: 21.2 % — AB (ref 11.5–15.5)
RDW: 20.3 % — ABNORMAL HIGH (ref 11.5–15.5)
WBC: 5.5 10*3/uL (ref 4.0–10.5)
WBC: 6.9 10*3/uL (ref 4.0–10.5)

## 2016-07-14 LAB — GLUCOSE, CAPILLARY
GLUCOSE-CAPILLARY: 118 mg/dL — AB (ref 65–99)
Glucose-Capillary: 117 mg/dL — ABNORMAL HIGH (ref 65–99)
Glucose-Capillary: 109 mg/dL — ABNORMAL HIGH (ref 65–99)
Glucose-Capillary: 186 mg/dL — ABNORMAL HIGH (ref 65–99)

## 2016-07-14 MED ORDER — PENTAFLUOROPROP-TETRAFLUOROETH EX AERO: 1.0000 "application " | INHALATION_SPRAY | CUTANEOUS | Status: DC | PRN

## 2016-07-14 MED ORDER — ALTEPLASE 2 MG IJ SOLR: 2.0000 mg | Freq: Once | INTRAMUSCULAR | Status: DC | PRN

## 2016-07-14 MED ORDER — LIDOCAINE-PRILOCAINE 2.5-2.5 % EX CREA
1.0000 | TOPICAL_CREAM | CUTANEOUS | Status: DC | PRN
Start: 2016-07-14 — End: 2016-07-14

## 2016-07-14 MED ORDER — SODIUM CHLORIDE 0.9 % IV SOLN
100.0000 mL | INTRAVENOUS | Status: DC | PRN
Start: 2016-07-14 — End: 2016-07-14

## 2016-07-14 MED ORDER — HEPARIN SODIUM (PORCINE) 1000 UNIT/ML DIALYSIS: 20.0000 [IU]/kg | INTRAMUSCULAR | Status: DC | PRN

## 2016-07-14 MED ORDER — LIDOCAINE HCL (PF) 1 % IJ SOLN: 5.0000 mL | INTRAMUSCULAR | Status: DC | PRN

## 2016-07-14 MED ORDER — HEPARIN SODIUM (PORCINE) 1000 UNIT/ML DIALYSIS: 1000.0000 [IU] | INTRAMUSCULAR | Status: DC | PRN

## 2016-07-14 NOTE — Care Management Important Message (Signed)
Important Message  Patient Details  Name: Debbie [name] MRN: [id number] Date of Birth: Jul 04, 1930   Medicare Important Message Given:  Yes    Sharin Mons, RN 07/14/2016, 10:59 AM

## 2016-07-14 NOTE — Progress Notes (Signed)
Physical Therapy Treatment Patient Details Name: Debbie [name] MRN: [id number] DOB: [birth year] Today's Date: 07/14/2016    History of Present Illness Pt is an 83 y/o female admitted from a SNF secondary to dyspnea and hyperkalemia. 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    PT Comments    Pt a little more tire and mildly more unsteady, but overall participated well with exercise and progressive ambulation.  Follow Up Recommendations  Home health PT;Supervision/Assistance - 24 hour     Equipment Recommendations  3in1 (PT)    Recommendations for Other Services       Precautions / Restrictions Precautions Precautions: Fall Precaution Comments: supplemental O2 Restrictions Weight Bearing Restrictions: No    Mobility  Bed Mobility           Sit to supine: Supervision   General bed mobility comments: No assist needed to return to bed.  Pt was assist up to [facility].  Transfers Overall transfer level: Needs assistance Equipment used: Rolling walker (2 wheeled) Transfers: Sit to/from Stand Sit to Stand: Min assist         General transfer comment: Stability assist briefly once standing  Ambulation/Gait Ambulation/Gait assistance: Min guard Ambulation Distance (Feet): 150 Feet Assistive device: Rolling walker (2 wheeled) Gait Pattern/deviations: Step-through pattern Gait velocity: slower Gait velocity interpretation: Below normal speed for age/gender General Gait Details: generally steady, but more guarded and needing more postural cues.   Stairs            Wheelchair Mobility    Modified Rankin (Stroke Patients Only)       Balance     Sitting balance-Leahy Scale: Fair       Standing balance-Leahy Scale: Poor Standing balance comment: reliant on the RW                    Cognition  Arousal/Alertness: Awake/alert Behavior During Therapy: WFL for tasks assessed/performed Overall Cognitive Status: Within Functional Limits for tasks assessed                      Exercises General Exercises - Lower Extremity Straight Leg Raises: AROM;Strengthening;Both;10 reps;Seated Hip Flexion/Marching: AROM;Both;10 reps;Seated    General Comments General comments (skin integrity, edema, etc.): Sats on 2L [place] adequate in the low 90's%      Pertinent Vitals/Pain Pain Assessment: No/denies pain    Home Living                      Prior Function            PT Goals (current goals can now be found in the care plan section) Acute Rehab PT Goals Patient Stated Goal: home PT Goal Formulation: With patient/family Time For Goal Achievement: 07/21/16 Potential to Achieve Goals: Fair Progress towards PT goals: Progressing toward goals;Goals met and updated - see care plan    Frequency    Min 3X/week      PT Plan Current plan remains appropriate    Co-evaluation             End of Session   Activity Tolerance: Patient tolerated treatment well Patient left: in bed;with call bell/phone within reach;with bed alarm set     Time: 1330 (1330)-1355 PT Time Calculation (min) (ACUTE ONLY): 25 min  Charges:  $Gait Training: 8-22 mins $Therapeutic Exercise: 8-22 mins  G CodesTessie Fass Billyjack Trompeter 07/14/2016, 6:13 PM  07/14/2016  Donnella Sham, [place] [phone]  (pager)

## 2016-07-14 NOTE — Progress Notes (Signed)
Assessment/Plan: 1 AKI nonoliguric but not great response to diuretics. Last HD 1/29.  Progress with vol but slow.   Still effusions and vol xs on CXR. 2 Anemia esa/Fe 3 CAD 4 DM 5 Debill  P HD today to optimize volume.  We may need to do HD as an OP to increase her functional capacity/volume for AKI.  Not ready to make that decision on my part.  Subjective: Interval History: Reports DOE  Objective: Vital signs in last 24 hours: Temp:  [97.4 F (36.3 C)-98.5 F (36.9 C)] 97.8 F (36.6 C) (02/05 0448) Pulse Rate:  [70-75] 70 (02/05 0448) Resp:  [18] 18 (02/05 0448) BP: (101-125)/(39-55) 121/48 (02/05 0448) SpO2:  [100 %] 100 % (02/05 0448) Weight:  [76.4 kg (168 lb 6.9 oz)] 76.4 kg (168 lb 6.9 oz) (02/04 2240) Weight change: 0.831 kg (1 lb 13.3 oz)  Intake/Output from previous day: 02/04 0701 - 02/05 0700 In: 680 [P.O.:680] Out: 2100 [Urine:2100] Intake/Output this shift: No intake/output data recorded.  General appearance: alert and cooperative Resp: diminished breath sounds bibasilar Chest wall: no tenderness Cardio: regular rate and rhythm, S1, S2 normal, no murmur, click, rub or gallop  Lab Results:  Recent Labs  07/14/16 0241  WBC 5.5  HGB 7.1*  HCT 23.6*  PLT 274   BMET:  Recent Labs  07/13/16 0405 07/14/16 0241  NA 137 135  K 3.8 3.6  CL 93* 91*  CO2 34* 34*  GLUCOSE 110* 115*  BUN 74* 80*  CREATININE 4.32* 4.72*  CALCIUM 9.0 8.9   No results for input(s): PTH in the last 72 hours. Iron Studies: No results for input(s): IRON, TIBC, TRANSFERRIN, FERRITIN in the last 72 hours. Studies/Results: Dg Chest 2 View  Result Date: 07/12/2016 CLINICAL DATA:  Pt c/o productive cough for 1 week. Pt denies chest pain or any pain at this time. Hx COPD, HTN, Diabetes, nonsmoker. EXAM: CHEST  2 VIEW COMPARISON:  07/07/2016 FINDINGS: Right IJ dialysis catheter tip overlies the superior vena cava. The heart is mildly enlarged. There are bibasilar opacities which  obscure the hemidiaphragms and are associated bilateral pleural effusions. Interstitial edema suspected. There has been improvement in aeration over recent studies. IMPRESSION: 1. Cardiomegaly and bilateral pleural effusions are stable. 2. Improvement in aeration ; persistent interstitial edema. Electronically Signed   By: Nolon Nations M.D.   On: 07/12/2016 16:34    Scheduled: . arformoterol  15 mcg Nebulization BID  . aspirin EC  81 mg Oral Daily  . atorvastatin  20 mg Oral q1800  . budesonide (PULMICORT) nebulizer solution  0.5 mg Nebulization BID  . Chlorhexidine Gluconate Cloth  6 each Topical Q0600  . darbepoetin (ARANESP) injection - NON-DIALYSIS  150 mcg Subcutaneous Q Wed-1800  . feeding supplement (NEPRO CARB STEADY)  237 mL Oral TID BM  . furosemide  160 mg Oral BID  . guaiFENesin  600 mg Oral BID  . heparin  5,000 Units Subcutaneous Q8H  . insulin aspart  0-9 Units Subcutaneous TID WC  . multivitamin with minerals  1 tablet Oral Daily  . mupirocin ointment  1 application Nasal BID  . sodium chloride  500 mL Intravenous Once  . sodium chloride flush  10-40 mL Intracatheter Q12H    LOS: 19 days   Trayvon Trumbull C 07/14/2016,12:41 PM

## 2016-07-14 NOTE — Progress Notes (Signed)
PROGRESS NOTE  Debbie [name]  [id number] DOB: 1930-12-08 DOA: 06/25/2016 PCP: Myriam Jacobson, MD Outpatient Specialists:  Subjective: No complaints this morning, May 2 0.1 L of urine yesterday. Creatinine continues to worsen 4.7.  Brief Narrative:  83 year old female with a past medical history of COPD, oxygen dependent, diastolic CHF, diabetes, chronic kidney disease, presented with shortness of breath and was found to have low oxygen levels and low blood pressure. She was found to have acute on chronic kidney disease. Initially admitted to step down then had to be transferred to ICU due to hypotension. Started on hemodialysis. She was stabilized and then transferred to the floor. Patient started on high-dose Lasix. Continues to require dialysis on and off.  Assessment & Plan:   Active Problems:   Renal failure   AKI (acute kidney injury) ([place])   Hyperkalemia   Atrial fibrillation (HCC)   Bradycardia   Central venous catheter in place   Acute kidney injury on chronic kidney disease, stage 3 -Baseline creatinine is 2.1 from 1/5, presented with creatinine of 6.9. -Initially admitted to the ICU because of hypotension started on CRRT. -Nephrology following now on intermittent dialysis, last HD was on 1/29. -Continue Lasix dose of 160 mg twice a day -Creatinine relatively stable at 4.7, her nephrologist she might need HD as outpatient. -Await recommendation from nephrology about outpatient plans.  Acute on chronic hypoxic resp failure -From reviewing the notes patient started on oxygen when she was discharged from the hospital on Christmas of 2017 -Currently sats 100% on 2 L try to wean off of oxygen  Biateral pleural effusion -Likely due to fluid overload. On x-rays also reviewed. Patient has had pleural effusions since December.  -Bilateral effusion, likely thoracentesis is not going to help, this is likely will improve with diuresis.  Chronic Diastolic heart  failure -This is based off of an echocardiogram from December. Previously on torsemide.  -She is behind on a high dose of Lasix, nephrology is controlling the Gwyndolyn Saxon.  Episodic bradycardia -Thought to be secondary to Hyperkalemia versus beta blocker. Resolved.  Recent H flu +/-HCAP -Stable  History of COPD  -Stable. Apparently on home oxygen. Continue home medications. No wheezing noted. Continue current medications.  Anemia of chronic disease -Hemoglobin is low but stable. No evidence for overt bleeding. Low iron and ferritin levels noted. Parenteral Iron was given.   History of Endometrial adenocarcinoma status post XRT -Stable, just finished XRT with Dr. Sondra Come on 05/22/16.   DVT prophylaxis: Subcutaneous heparin Code Status: Full Code Family Communication:  Disposition Plan: Await creatinine stabilization and reliable urine output Diet: Diet Carb Modified Fluid consistency: Thin; Room service appropriate? Yes; Fluid restriction: 1200 mL Fluid  Consultants:   Nephrology  Procedures:   Dialysis  Antimicrobials:   None  Objective: Vitals:   07/13/16 1000 07/13/16 1900 07/13/16 2240 07/14/16 0448  BP: (!) 121/49 (!) 125/55 (!) 101/39 (!) 121/48  Pulse: 74 75 74 70  Resp: 16 18 18 18   Temp: 98.1 F (36.7 C) 98.5 F (36.9 C) 97.4 F (36.3 C) 97.8 F (36.6 C)  TempSrc: Oral Oral Oral Oral  SpO2: 100% 100% 100% 100%  Weight:   76.4 kg (168 lb 6.9 oz)   Height:        Intake/Output Summary (Last 24 hours) at 07/14/16 1255 Last data filed at 07/14/16 0449  Gross per 24 hour  Intake              440 ml  Output  1850 ml  Net            -1410 ml   Filed Weights   07/11/16 2141 07/12/16 2038 07/13/16 2240  Weight: 74.9 kg (165 lb 1.6 oz) 75.6 kg (166 lb 9.6 oz) 76.4 kg (168 lb 6.9 oz)    Examination: General exam: Appears calm and comfortable  Respiratory system: Clear to auscultation. Respiratory effort normal. Cardiovascular system: S1 &  S2 heard, RRR. No JVD, murmurs, rubs, gallops or clicks. No pedal edema. Gastrointestinal system: Abdomen is nondistended, soft and nontender. No organomegaly or masses felt. Normal bowel sounds heard. Central nervous system: Alert and oriented. No focal neurological deficits. Extremities: Symmetric 5 x 5 power. Skin: No rashes, lesions or ulcers Psychiatry: Judgement and insight appear normal. Mood & affect appropriate.   Data Reviewed: I have personally reviewed following labs and imaging studies  CBC:  Recent Labs Lab 07/11/16 0447 07/14/16 0241  WBC 6.1 5.5  HGB 7.5* 7.1*  HCT 25.1* 23.6*  MCV 99.2 100.4*  PLT [id number]   Basic Metabolic Panel:  Recent Labs Lab 07/10/16 0458 07/11/16 0447 07/12/16 0433 07/13/16 0405 07/14/16 0241  NA 138 138 139 137 135  K 4.1 3.7 3.7 3.8 3.6  CL 101 97* 96* 93* 91*  CO2 32 31 33* 34* 34*  GLUCOSE 124* 112* 106* 110* 115*  BUN 37* 47* 60* 74* 80*  CREATININE 3.61* 3.87* 4.22* 4.32* 4.72*  CALCIUM 8.5* 8.7* 9.1 9.0 8.9  PHOS 1.9* 1.9* 1.7* 1.5* 1.8*   GFR: Estimated Creatinine Clearance: 9.1 mL/min (by C-G formula based on SCr of 4.72 mg/dL (H)). Liver Function Tests:  Recent Labs Lab 07/10/16 0458 07/11/16 0447 07/12/16 0433 07/13/16 0405 07/14/16 0241  ALBUMIN 2.2* 2.2* 2.3* 2.2* 2.4*   No results for input(s): LIPASE, AMYLASE in the last 168 hours. No results for input(s): AMMONIA in the last 168 hours. Coagulation Profile: No results for input(s): INR, PROTIME in the last 168 hours. Cardiac Enzymes: No results for input(s): CKTOTAL, CKMB, CKMBINDEX, TROPONINI in the last 168 hours. BNP (last 3 results) No results for input(s): PROBNP in the last 8760 hours. HbA1C: No results for input(s): HGBA1C in the last 72 hours. CBG:  Recent Labs Lab 07/13/16 1218 07/13/16 1721 07/13/16 2236 07/14/16 0811 07/14/16 1232  GLUCAP 137* 120* 190* 117* 118*   Lipid Profile: No results for input(s): CHOL, HDL, LDLCALC,  TRIG, CHOLHDL, LDLDIRECT in the last 72 hours. Thyroid Function Tests: No results for input(s): TSH, T4TOTAL, FREET4, T3FREE, THYROIDAB in the last 72 hours. Anemia Panel: No results for input(s): VITAMINB12, FOLATE, FERRITIN, TIBC, IRON, RETICCTPCT in the last 72 hours. Urine analysis:    Component Value Date/Time   COLORURINE YELLOW 06/26/2016 0028   APPEARANCEUR CLOUDY (A) 06/26/2016 0028   LABSPEC 1.013 06/26/2016 0028   PHURINE 5.0 06/26/2016 0028   GLUCOSEU 50 (A) 06/26/2016 0028   HGBUR NEGATIVE 06/26/2016 0028   BILIRUBINUR NEGATIVE 06/26/2016 0028   KETONESUR NEGATIVE 06/26/2016 0028   PROTEINUR 100 (A) 06/26/2016 0028   UROBILINOGEN 1.0 03/07/2014 1510   NITRITE NEGATIVE 06/26/2016 0028   LEUKOCYTESUR TRACE (A) 06/26/2016 0028   Sepsis Labs: @LABRCNTIP (procalcitonin:4,lacticidven:4)  ) Recent Results (from the past 240 hour(s))  MRSA PCR Screening     Status: Abnormal   Collection Time: 07/12/16  6:15 AM  Result Value Ref Range Status   MRSA by PCR POSITIVE (A) NEGATIVE Final    Comment:        The GeneXpert MRSA Assay (  FDA approved for NASAL specimens only), is one component of a comprehensive MRSA colonization surveillance program. It is not intended to diagnose MRSA infection nor to guide or monitor treatment for MRSA infections. RESULT CALLED TO, READ BACK BY AND VERIFIED WITH: A. BRAKE [id number] 02.03.2018 N. MORRIS      Invalid input(s): PROCALCITONIN, LACTICACIDVEN   Radiology Studies: Dg Chest 2 View  Result Date: 07/12/2016 CLINICAL DATA:  Pt c/o productive cough for 1 week. Pt denies chest pain or any pain at this time. Hx COPD, HTN, Diabetes, nonsmoker. EXAM: CHEST  2 VIEW COMPARISON:  07/07/2016 FINDINGS: Right IJ dialysis catheter tip overlies the superior vena cava. The heart is mildly enlarged. There are bibasilar opacities which obscure the hemidiaphragms and are associated bilateral pleural effusions. Interstitial edema suspected. There has been  improvement in aeration over recent studies. IMPRESSION: 1. Cardiomegaly and bilateral pleural effusions are stable. 2. Improvement in aeration ; persistent interstitial edema. Electronically Signed   By: Nolon Nations M.D.   On: 07/12/2016 16:34        Scheduled Meds: . arformoterol  15 mcg Nebulization BID  . aspirin EC  81 mg Oral Daily  . atorvastatin  20 mg Oral q1800  . budesonide (PULMICORT) nebulizer solution  0.5 mg Nebulization BID  . Chlorhexidine Gluconate Cloth  6 each Topical Q0600  . darbepoetin (ARANESP) injection - NON-DIALYSIS  150 mcg Subcutaneous Q Wed-1800  . feeding supplement (NEPRO CARB STEADY)  237 mL Oral TID BM  . furosemide  160 mg Oral BID  . guaiFENesin  600 mg Oral BID  . heparin  5,000 Units Subcutaneous Q8H  . insulin aspart  0-9 Units Subcutaneous TID WC  . multivitamin with minerals  1 tablet Oral Daily  . mupirocin ointment  1 application Nasal BID  . sodium chloride  500 mL Intravenous Once  . sodium chloride flush  10-40 mL Intracatheter Q12H   Continuous Infusions:   LOS: 19 days    Time spent: 35 minutes    Lucion Dilger A, MD [facility] Pager [phone]  If 7PM-7AM, please contact night-coverage [email or web address] Password [email or web address] 07/14/2016, 12:55 PM

## 2016-07-15 DIAGNOSIS — N186 End stage renal disease: Secondary | ICD-10-CM

## 2016-07-15 LAB — CBC
HEMATOCRIT: 25.7 % — AB (ref 36.0–46.0)
Hemoglobin: 7.7 g/dL — ABNORMAL LOW (ref 12.0–15.0)
MCH: 30.1 pg (ref 26.0–34.0)
MCHC: 30 g/dL (ref 30.0–36.0)
MCV: 100.4 fL — ABNORMAL HIGH (ref 78.0–100.0)
Platelets: 288 10*3/uL (ref 150–400)
RBC: 2.56 MIL/uL — ABNORMAL LOW (ref 3.87–5.11)
RDW: 20.7 % — ABNORMAL HIGH (ref 11.5–15.5)
WBC: 6.1 10*3/uL (ref 4.0–10.5)

## 2016-07-15 LAB — RENAL FUNCTION PANEL
ALBUMIN: 2.4 g/dL — AB (ref 3.5–5.0)
ANION GAP: 9 (ref 5–15)
BUN: 35 mg/dL — ABNORMAL HIGH (ref 6–20)
CALCIUM: 8.2 mg/dL — AB (ref 8.9–10.3)
CO2: 31 mmol/L (ref 22–32)
Chloride: 96 mmol/L — ABNORMAL LOW (ref 101–111)
Creatinine, Ser: 2.62 mg/dL — ABNORMAL HIGH (ref 0.44–1.00)
GFR, EST AFRICAN AMERICAN: 18 mL/min — AB (ref >60)
GFR, EST NON AFRICAN AMERICAN: 16 mL/min — AB (ref >60)
GLUCOSE: 120 mg/dL — AB (ref 65–99)
PHOSPHORUS: 2 mg/dL — AB (ref 2.5–4.6)
POTASSIUM: 4.3 mmol/L (ref 3.5–5.1)
Sodium: 136 mmol/L (ref 135–145)

## 2016-07-15 LAB — GLUCOSE, CAPILLARY
GLUCOSE-CAPILLARY: 115 mg/dL — AB (ref 65–99)
GLUCOSE-CAPILLARY: 107 mg/dL — AB (ref 65–99)
Glucose-Capillary: 156 mg/dL — ABNORMAL HIGH (ref 65–99)

## 2016-07-15 MED ORDER — ALTEPLASE 2 MG IJ SOLR: 2.0000 mg | Freq: Once | INTRAMUSCULAR | Status: DC | PRN

## 2016-07-15 MED ORDER — HEPARIN SODIUM (PORCINE) 1000 UNIT/ML DIALYSIS: 1000.0000 [IU] | INTRAMUSCULAR | Status: DC | PRN

## 2016-07-15 MED ORDER — HEPARIN SODIUM (PORCINE) 1000 UNIT/ML DIALYSIS: 20.0000 [IU]/kg | INTRAMUSCULAR | Status: DC | PRN

## 2016-07-15 MED ORDER — LIDOCAINE-PRILOCAINE 2.5-2.5 % EX CREA: 1.0000 "application " | TOPICAL_CREAM | CUTANEOUS | Status: DC | PRN

## 2016-07-15 MED ORDER — LIDOCAINE HCL (PF) 1 % IJ SOLN: 5.0000 mL | INTRAMUSCULAR | Status: DC | PRN

## 2016-07-15 MED ORDER — SODIUM CHLORIDE 0.9 % IV SOLN: 100.0000 mL | INTRAVENOUS | Status: DC | PRN

## 2016-07-15 MED ORDER — PENTAFLUOROPROP-TETRAFLUOROETH EX AERO: 1.0000 "application " | INHALATION_SPRAY | CUTANEOUS | Status: DC | PRN

## 2016-07-15 NOTE — Progress Notes (Signed)
Assessment/Plan: 1 AKI nonoliguric, but dialysis dependent and cannot sustain health with meds only  Will commit to HD as OP 2 Anemia esa/Fe 3 CAD 4 DM 5 Debill  P HD again today to optimize volume. VVS for [facility] and AVF, CLIP   Subjective: Interval History: Feels better with HD, appetite improved  Objective: Vital signs in last 24 hours: Temp:  [98 F (36.7 C)-99.2 F (37.3 C)] 98.1 F (36.7 C) (02/06 0836) Pulse Rate:  [62-86] 86 (02/06 0836) Resp:  [16-18] 18 (02/06 0836) BP: (106-143)/(44-89) 121/89 (02/06 0836) SpO2:  [97 %-100 %] 100 % (02/06 0836) Weight:  [73.1 kg (161 lb 3.2 oz)-76.7 kg (169 lb 1.5 oz)] 73.1 kg (161 lb 3.2 oz) (02/05 2124) Weight change: 0.3 kg (10.6 oz)  Intake/Output from previous day: 02/05 0701 - 02/06 0700 In: 717 [P.O.:717] Out: [id number] [Urine:1975] Intake/Output this shift: No intake/output data recorded.  General appearance: alert and cooperative Chest wall: no tenderness, tr presacral ede Extremities: extremities normal, atraumatic, no cyanosis or edema  Lab Results:  Recent Labs  07/14/16 0241 07/14/16 1806  WBC 5.5 6.9  HGB 7.1* 8.0*  HCT 23.6* 26.4*  PLT 274 275   BMET:  Recent Labs  07/14/16 1558 07/15/16 0516  NA 136 136  K 3.8 4.3  CL 91* 96*  CO2 33* 31  GLUCOSE 134* 120*  BUN 69* 35*  CREATININE 4.20* 2.62*  CALCIUM 8.5* 8.2*   No results for input(s): PTH in the last 72 hours. Iron Studies: No results for input(s): IRON, TIBC, TRANSFERRIN, FERRITIN in the last 72 hours. Studies/Results: No results found.  Scheduled: . arformoterol  15 mcg Nebulization BID  . aspirin EC  81 mg Oral Daily  . atorvastatin  20 mg Oral q1800  . budesonide (PULMICORT) nebulizer solution  0.5 mg Nebulization BID  . Chlorhexidine Gluconate Cloth  6 each Topical Q0600  . darbepoetin (ARANESP) injection - NON-DIALYSIS  150 mcg Subcutaneous Q Wed-1800  . feeding supplement (NEPRO CARB STEADY)  237 mL Oral TID BM  . furosemide  160 mg  Oral BID  . guaiFENesin  600 mg Oral BID  . heparin  5,000 Units Subcutaneous Q8H  . insulin aspart  0-9 Units Subcutaneous TID WC  . multivitamin with minerals  1 tablet Oral Daily  . mupirocin ointment  1 application Nasal BID  . sodium chloride  500 mL Intravenous Once  . sodium chloride flush  10-40 mL Intracatheter Q12H    LOS: 20 days   Beckem Tomberlin C 07/15/2016,8:54 AM

## 2016-07-15 NOTE — Consult Note (Signed)
[facility] [place]  Patient name: Debbie [name] MRN: [id number] DOB: Apr 13, 1931 Sex: female  REASON FOR CONSULT: permanent dialysis access and [facility], consult is from Dr. Florene Glen  HPI: Debbie [name] is a 83 y.o. female, who presents for evaluation for permanent dialysis access. She denies any known history of kidney disease. She is right handed. She is currently dialyzing via a right IJ temporary catheter. She is known to our practice from having undergone stage bilateral carotid endarterectomies by Dr. Kellie Simmering for asymptomatic high grade carotid stenosis.   She presented to the [facility] ED on 06/25/16 with dyspnea and worsening renal function and hyperkalemia. While in the ED, she had worsening hypoxia and hypotension and was transferred to [facility]. A temporary catheter was placed and she was started on CRRT that night. She was recently discharged from [facility] on 06/13/16 after being treated for COPD exacerbation and HCAP.   The patient lives at home with her son, although she was recently at [facility] following previous hospitalization. She states that she ambulates well without assistance.   She denies any history of CAD. She does have a history of diastolic heart failure and hypertension on multiple medications. She is on home oxygen for COPD. She denies any history of CVA. She is diabetic on metformin and insulin. She has a history of endometrial adenocarcinoma s/p radiation therapy.   She denies any familial history of premature heart disease.   Past Medical History:  Diagnosis Date  . Allergy   . Anxiety   . Arthritis   . Cancer ([place]) dx'd 12/2007   endometroid adenocarcinoma  . Cataract    both  . Diabetes mellitus    fasting [id number]  . Dizziness   . History of radiation therapy 9/21,9/28,10/05,05/20/2008   4 txs 2400 cGy endometrial adenocarcinoma  . Hypertension   . Peripheral vascular disease ([place])   . Seasonal allergies   . 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 UTI (urinary tract infection) 05/2016    Family History  Problem Relation Age of Onset  . Actinic keratosis Mother   . Cancer Mother     uterine  . Heart disease Father   . Hyperlipidemia Father   . Hypertension Father   . Stroke Father   . Cancer Sister     lung    SOCIAL HISTORY: Social History   Social History  . Marital status: Married    Spouse name: N/A  . Number of children: N/A  . Years of education: N/A   Occupational History  . retired     Social History Main Topics  . Smoking status: Never Smoker  . Smokeless tobacco: Never Used  . Alcohol use No  . Drug use: No  . Sexual activity: No   Other Topics Concern  . Not on file   Social History Narrative   Lives alone, son main caretaker.     Allergies  Allergen Reactions  . Codeine Other (See Comments)    Unusual mouth sensation    Current Facility-Administered Medications  Medication Dose Route Frequency Provider Last Rate Last Dose  . 0.9 %  sodium chloride infusion  100 mL Intravenous PRN Madelon Lips, MD      . 0.9 %  sodium chloride infusion  100 mL Intravenous PRN Madelon Lips, MD      . acetaminophen (TYLENOL) tablet 650 mg  650 mg Oral Q6H PRN Mauricio Gerome Apley, MD  Or  . acetaminophen (TYLENOL) suppository 650 mg  650 mg Rectal Q6H PRN Tawni Millers, MD      . albuterol (PROVENTIL) (2.5 MG/3ML) 0.083% nebulizer solution 2.5 mg  2.5 mg Nebulization Q3H PRN Rahul P Desai, PA-C   2.5 mg at 06/28/16 0444  . arformoterol (BROVANA) nebulizer solution 15 mcg  15 mcg Nebulization BID Rahul P Desai, PA-C   15 mcg at 07/14/16 2009  . aspirin EC tablet 81 mg  81 mg Oral Daily Mauricio Gerome Apley, MD   81 mg at 07/14/16 1025  . atorvastatin (LIPITOR) tablet 20 mg  20 mg Oral q1800 Rahul P Desai, PA-C   20 mg at 07/14/16 2054  . budesonide (PULMICORT) nebulizer solution 0.5 mg  0.5 mg Nebulization BID Rahul P Desai, PA-C   0.5 mg at  07/14/16 2009  . Chlorhexidine Gluconate Cloth 2 % PADS 6 each  6 each Topical Q0600 Verlee Monte, MD   6 each at 07/15/16 0559  . Darbepoetin Alfa (ARANESP) injection 150 mcg  150 mcg Subcutaneous Q Wed-1800 Jamal Maes, MD   150 mcg at 07/09/16 1726  . feeding supplement (NEPRO CARB STEADY) liquid 237 mL  237 mL Oral TID BM Bonnielee Haff, MD   237 mL at 07/14/16 2054  . furosemide (LASIX) tablet 160 mg  160 mg Oral BID Mauricia Area, MD   160 mg at 07/15/16 0839  . guaiFENesin (MUCINEX) 12 hr tablet 600 mg  600 mg Oral BID Rhetta Mura Schorr, NP   600 mg at 07/14/16 2054  . heparin injection 5,000 Units  5,000 Units Subcutaneous Q8H Mauricio Gerome Apley, MD   5,000 Units at 07/15/16 0600  . insulin aspart (novoLOG) injection 0-9 Units  0-9 Units Subcutaneous TID WC Tawni Millers, MD   3 Units at 07/11/16 1346  . lidocaine (PF) (XYLOCAINE) 1 % injection 5 mL  5 mL Intradermal PRN Madelon Lips, MD      . multivitamin with minerals tablet 1 tablet  1 tablet Oral Daily Mauricio Gerome Apley, MD   1 tablet at 07/14/16 1025  . mupirocin ointment (BACTROBAN) 2 % 1 application  1 application Nasal BID Verlee Monte, MD   1 application at [id number]  . ondansetron (ZOFRAN) tablet 4 mg  4 mg Oral Q6H PRN Tawni Millers, MD       Or  . ondansetron [facility]) injection 4 mg  4 mg Intravenous Q6H PRN Mauricio Gerome Apley, MD      . sodium chloride (OCEAN) 0.65 % nasal spray 1 spray  1 spray Each Nare PRN Tawni Millers, MD      . sodium chloride 0.9 % bolus 500 mL  500 mL Intravenous Once Tawni Millers, MD      . sodium chloride flush (NS) 0.9 % injection 10-40 mL  10-40 mL Intracatheter Q12H Nita Sells, MD   10 mL at 07/14/16 1026  . sodium chloride flush (NS) 0.9 % injection 10-40 mL  10-40 mL Intracatheter PRN Nita Sells, MD   10 mL at 07/10/16 2140    REVIEW OF SYSTEMS:  [X]  denotes positive finding, [ ]  denotes negative finding Cardiac   Comments:  Chest pain or chest pressure:    Shortness of breath upon exertion:    Short of breath when lying flat:    Irregular heart rhythm:        Vascular    Pain in calf, thigh, or hip brought on by ambulation:  Pain in feet at night that wakes you up from your sleep:     Blood clot in your veins:    Leg swelling:         Pulmonary    Oxygen at home: x   Productive cough:     Wheezing:         Neurologic    Sudden weakness in arms or legs:     Sudden numbness in arms or legs:     Sudden onset of difficulty speaking or slurred speech:    Temporary loss of vision in one eye:     Problems with dizziness:         Gastrointestinal    Blood in stool:     Vomited blood:         Genitourinary    Burning when urinating:     Blood in urine:        Psychiatric    Major depression:         Hematologic    Bleeding problems:    Problems with blood clotting too easily:        Skin    Rashes or ulcers:        Constitutional    Fever or chills:      PHYSICAL EXAM: Vitals:   07/15/16 0836 07/15/16 0930 07/15/16 0934 07/15/16 0940  BP: 121/89 (!) 84/43 (!) 94/48 (!) 95/47  Pulse: 86 76 75 73  Resp: 18 16    Temp: 98.1 F (36.7 C) 98.6 F (37 C)    TempSrc: Oral Oral    SpO2: 100% 99%    Weight:  163 lb 2.3 oz (74 kg)    Height:        GENERAL: Patient seen in HD. The patient is a well-nourished female, in no acute distress. The vital signs are documented above. CARDIAC: There is a regular rate and rhythm. Right IJ temporary dialysis catheter VASCULAR: Non palpable radial and ulnar pulses bilaterally. Palpable 2+ right brachial pulse. Non palpable left brachial pulse. Feet are warm bilaterally.  PULMONARY: Non labored respiratory effort. Clear anterior.  MUSCULOSKELETAL: There are no major deformities or cyanosis. NEUROLOGIC: No focal weakness or paresthesias are detected. SKIN: There are no ulcers or rashes noted. PSYCHIATRIC: The patient has a normal  affect.   MEDICAL ISSUES: AKI nonoliguric, dialysis dependent  The patient currently has a temporary dialysis catheter. She is right handed. Will obtain vein mapping. Upper extremity pulses are difficult to palpate, therefore will also obtain upper extremity arterial duplex. Will make access recommendation pending results of studies. Plan for [facility] and permanent access in near future. Dr. Scot Dock to see.    Virgina Jock, PA-C [facility] [place]   I have interviewed the patient and examined the patient. I agree with the findings by the PA. Vein map and arterial duplex pending. Tentatively plan Left AVF/AVG and [facility] Thursday.  Gae Gallop, MD [phone]

## 2016-07-15 NOTE — Progress Notes (Signed)
PROGRESS NOTE  Debbie [name]  [id number] DOB: 1930-10-29 DOA: 06/25/2016 PCP: Myriam Jacobson, MD Outpatient Specialists:  Subjective: No complaints this morning, May 2 0.1 L of urine yesterday. Creatinine continues to worsen 4.7.  Brief Narrative:  83 year old female with a past medical history of COPD, oxygen dependent, diastolic CHF, diabetes, chronic kidney disease, presented with shortness of breath and was found to have low oxygen levels and low blood pressure. She was found to have acute on chronic kidney disease. Initially admitted to step down then had to be transferred to ICU due to hypotension. Started on hemodialysis. She is being on the dialysis, improvement, diuresis worsening cycle twice, currently planning for tunneled HD catheter AVF and outpatient dialysis  Assessment & Plan:   Active Problems:   Renal failure   AKI (acute kidney injury) ([place])   Hyperkalemia   Atrial fibrillation (HCC)   Bradycardia   Central venous catheter in place   Acute kidney injury on chronic kidney disease, stage 3 -Baseline creatinine is 2.1 from 1/5, presented with creatinine of 6.9. -Initially admitted to the ICU because of hypotension started on CRRT. -Nephrology following now on intermittent dialysis, last HD was on 1/29. -Continue Lasix dose of 160 mg twice a day -Received dialysis yesterday, long-term plans as to place a tunneled catheter, AVF and preparation for outpatient dialysis. -Vascular surgery consulted, AVF and dull catheter will be placed on Thursday.  Acute on chronic hypoxic resp failure -From reviewing the notes patient started on oxygen when she was discharged from the hospital on Christmas of 2017 -Currently sats 100% on 2 L try to wean off of oxygen  Biateral pleural effusion -Likely due to fluid overload. On x-rays also reviewed. Patient has had pleural effusions since December.  -Bilateral effusion, likely thoracentesis is not going to help, this  is likely will improve with diuresis.  Chronic Diastolic heart failure -This is based off of an echocardiogram from December. Previously on torsemide.  -She is behind on a high dose of Lasix, nephrology is controlling the Gwyndolyn Saxon.  Episodic bradycardia -Thought to be secondary to Hyperkalemia versus beta blocker. Resolved.  Recent H flu +/-HCAP -Stable  History of COPD  -Stable. Apparently on home oxygen. Continue home medications. No wheezing noted. Continue current medications.  Anemia of chronic disease -Hemoglobin is low but stable. No evidence for overt bleeding. Low iron and ferritin levels noted. Parenteral Iron was given.   History of Endometrial adenocarcinoma status post XRT -Stable, just finished XRT with Dr. Sondra Come on 05/22/16.   DVT prophylaxis: Subcutaneous heparin Code Status: Full Code Family Communication:  Disposition Plan: Await creatinine stabilization and reliable urine output Diet: Diet Carb Modified Fluid consistency: Thin; Room service appropriate? Yes; Fluid restriction: 1200 mL Fluid  Consultants:   Nephrology  Procedures:   Dialysis  Antimicrobials:   None  Objective: Vitals:   07/15/16 1230 07/15/16 1245 07/15/16 1300 07/15/16 1317  BP: (!) 99/52 (!) 95/43 (!) 91/56 (!) 108/50  Pulse: 66 76 65 69  Resp:    16  Temp:    98.2 F (36.8 C)  TempSrc:    Oral  SpO2:    98%  Weight:    72.2 kg (159 lb 2.8 oz)  Height:        Intake/Output Summary (Last 24 hours) at 07/15/16 1335 Last data filed at 07/15/16 1317  Gross per 24 hour  Intake              717 ml  Output  5664 ml  Net            -4947 ml   Filed Weights   07/14/16 2124 07/15/16 0930 07/15/16 1317  Weight: 73.1 kg (161 lb 3.2 oz) 74 kg (163 lb 2.3 oz) 72.2 kg (159 lb 2.8 oz)    Examination: General exam: Appears calm and comfortable  Respiratory system: Clear to auscultation. Respiratory effort normal. Cardiovascular system: S1 & S2 heard, RRR. No  JVD, murmurs, rubs, gallops or clicks. No pedal edema. Gastrointestinal system: Abdomen is nondistended, soft and nontender. No organomegaly or masses felt. Normal bowel sounds heard. Central nervous system: Alert and oriented. No focal neurological deficits. Extremities: Symmetric 5 x 5 power. Skin: No rashes, lesions or ulcers Psychiatry: Judgement and insight appear normal. Mood & affect appropriate.   Data Reviewed: I have personally reviewed following labs and imaging studies  CBC:  Recent Labs Lab 07/11/16 0447 07/14/16 0241 07/14/16 1806 07/15/16 0930  WBC 6.1 5.5 6.9 6.1  HGB 7.5* 7.1* 8.0* 7.7*  HCT 25.1* 23.6* 26.4* 25.7*  MCV 99.2 100.4* 99.2 100.4*  PLT 269 274 275 123XX123   Basic Metabolic Panel:  Recent Labs Lab 07/12/16 0433 07/13/16 0405 07/14/16 0241 07/14/16 1558 07/15/16 0516  NA 139 137 135 136 136  K 3.7 3.8 3.6 3.8 4.3  CL 96* 93* 91* 91* 96*  CO2 33* 34* 34* 33* 31  GLUCOSE 106* 110* 115* 134* 120*  BUN 60* 74* 80* 69* 35*  CREATININE 4.22* 4.32* 4.72* 4.20* 2.62*  CALCIUM 9.1 9.0 8.9 8.5* 8.2*  PHOS 1.7* 1.5* 1.8* 1.9* 2.0*   GFR: Estimated Creatinine Clearance: 15.9 mL/min (by C-G formula based on SCr of 2.62 mg/dL (H)). Liver Function Tests:  Recent Labs Lab 07/12/16 0433 07/13/16 0405 07/14/16 0241 07/14/16 1558 07/15/16 0516  ALBUMIN 2.3* 2.2* 2.4* 2.4* 2.4*   No results for input(s): LIPASE, AMYLASE in the last 168 hours. No results for input(s): AMMONIA in the last 168 hours. Coagulation Profile: No results for input(s): INR, PROTIME in the last 168 hours. Cardiac Enzymes: No results for input(s): CKTOTAL, CKMB, CKMBINDEX, TROPONINI in the last 168 hours. BNP (last 3 results) No results for input(s): PROBNP in the last 8760 hours. HbA1C: No results for input(s): HGBA1C in the last 72 hours. CBG:  Recent Labs Lab 07/14/16 0811 07/14/16 1232 07/14/16 1930 07/14/16 2130 07/15/16 0754  GLUCAP 117* 118* 109* 186* 115*    Lipid Profile: No results for input(s): CHOL, HDL, LDLCALC, TRIG, CHOLHDL, LDLDIRECT in the last 72 hours. Thyroid Function Tests: No results for input(s): TSH, T4TOTAL, FREET4, T3FREE, THYROIDAB in the last 72 hours. Anemia Panel: No results for input(s): VITAMINB12, FOLATE, FERRITIN, TIBC, IRON, RETICCTPCT in the last 72 hours. Urine analysis:    Component Value Date/Time   COLORURINE YELLOW 06/26/2016 0028   APPEARANCEUR CLOUDY (A) 06/26/2016 0028   LABSPEC 1.013 06/26/2016 0028   PHURINE 5.0 06/26/2016 0028   GLUCOSEU 50 (A) 06/26/2016 0028   HGBUR NEGATIVE 06/26/2016 0028   BILIRUBINUR NEGATIVE 06/26/2016 0028   KETONESUR NEGATIVE 06/26/2016 0028   PROTEINUR 100 (A) 06/26/2016 0028   UROBILINOGEN 1.0 03/07/2014 1510   NITRITE NEGATIVE 06/26/2016 0028   LEUKOCYTESUR TRACE (A) 06/26/2016 0028   Sepsis Labs: @LABRCNTIP (procalcitonin:4,lacticidven:4)  ) Recent Results (from the past 240 hour(s))  MRSA PCR Screening     Status: Abnormal   Collection Time: 07/12/16  6:15 AM  Result Value Ref Range Status   MRSA by PCR POSITIVE (A) NEGATIVE Final  Comment:        The GeneXpert MRSA Assay (FDA approved for NASAL specimens only), is one component of a comprehensive MRSA colonization surveillance program. It is not intended to diagnose MRSA infection nor to guide or monitor treatment for MRSA infections. RESULT CALLED TO, READ BACK BY AND VERIFIED WITH: A. BRAKE [id number] 02.03.2018 N. MORRIS      Invalid input(s): PROCALCITONIN, LACTICACIDVEN   Radiology Studies: No results found.      Scheduled Meds: . arformoterol  15 mcg Nebulization BID  . aspirin EC  81 mg Oral Daily  . atorvastatin  20 mg Oral q1800  . budesonide (PULMICORT) nebulizer solution  0.5 mg Nebulization BID  . Chlorhexidine Gluconate Cloth  6 each Topical Q0600  . darbepoetin (ARANESP) injection - NON-DIALYSIS  150 mcg Subcutaneous Q Wed-1800  . feeding supplement (NEPRO CARB STEADY)  237  mL Oral TID BM  . furosemide  160 mg Oral BID  . guaiFENesin  600 mg Oral BID  . heparin  5,000 Units Subcutaneous Q8H  . insulin aspart  0-9 Units Subcutaneous TID WC  . multivitamin with minerals  1 tablet Oral Daily  . mupirocin ointment  1 application Nasal BID  . sodium chloride  500 mL Intravenous Once  . sodium chloride flush  10-40 mL Intracatheter Q12H   Continuous Infusions:   LOS: 20 days    Time spent: 35 minutes    Redford Behrle A, MD [facility] Pager [phone]  If 7PM-7AM, please contact night-coverage [email or web address] Password [email or web address] 07/15/2016, 1:35 PM

## 2016-07-16 ENCOUNTER — Inpatient Hospital Stay (HOSPITAL_COMMUNITY): Payer: Medicare HMO

## 2016-07-16 DIAGNOSIS — Z01810 Encounter for preprocedural cardiovascular examination: Secondary | ICD-10-CM

## 2016-07-16 LAB — RENAL FUNCTION PANEL
ALBUMIN: 2.4 g/dL — AB (ref 3.5–5.0)
Anion gap: 10 (ref 5–15)
BUN: 21 mg/dL — AB (ref 6–20)
CHLORIDE: 93 mmol/L — AB (ref 101–111)
CO2: 31 mmol/L (ref 22–32)
CREATININE: 1.88 mg/dL — AB (ref 0.44–1.00)
Calcium: 8 mg/dL — ABNORMAL LOW (ref 8.9–10.3)
GFR calc Af Amer: 27 mL/min — ABNORMAL LOW (ref >60)
GFR, EST NON AFRICAN AMERICAN: 23 mL/min — AB (ref >60)
Glucose, Bld: 102 mg/dL — ABNORMAL HIGH (ref 65–99)
PHOSPHORUS: 1.8 mg/dL — AB (ref 2.5–4.6)
Potassium: 3.5 mmol/L (ref 3.5–5.1)
Sodium: 134 mmol/L — ABNORMAL LOW (ref 135–145)

## 2016-07-16 LAB — GLUCOSE, CAPILLARY
GLUCOSE-CAPILLARY: 124 mg/dL — AB (ref 65–99)
GLUCOSE-CAPILLARY: 109 mg/dL — AB (ref 65–99)
GLUCOSE-CAPILLARY: 111 mg/dL — AB (ref 65–99)
Glucose-Capillary: 145 mg/dL — ABNORMAL HIGH (ref 65–99)
Glucose-Capillary: 136 mg/dL — ABNORMAL HIGH (ref 65–99)

## 2016-07-16 MED ORDER — CEFAZOLIN SODIUM-DEXTROSE 2-4 GM/100ML-% IV SOLN
2.0000 g | INTRAVENOUS | Status: AC
  Administered 2016-07-17: 2 g via INTRAVENOUS
  Filled 2016-07-16 (×2): qty 100

## 2016-07-16 MED ORDER — DIPHENHYDRAMINE HCL 12.5 MG/5ML PO ELIX
12.5000 mg | ORAL_SOLUTION | Freq: Every evening | ORAL | Status: DC | PRN
  Administered 2016-07-17 – 2016-07-18 (×2): 12.5 mg via ORAL
  Filled 2016-07-16 (×2): qty 5

## 2016-07-16 MED ORDER — DIPHENHYDRAMINE HCL 12.5 MG/5ML PO ELIX
12.5000 mg | ORAL_SOLUTION | Freq: Once | ORAL | Status: AC
  Administered 2016-07-16: 12.5 mg via ORAL
  Filled 2016-07-16: qty 5

## 2016-07-16 MED ORDER — CEFAZOLIN IN D5W 1 GM/50ML IV SOLN: 1.0000 g | INTRAVENOUS | Status: DC

## 2016-07-16 MED ORDER — DARBEPOETIN ALFA 150 MCG/0.3ML IJ SOSY
150.0000 ug | PREFILLED_SYRINGE | INTRAMUSCULAR | Status: DC
  Administered 2016-07-17: 150 ug via INTRAVENOUS
  Filled 2016-07-16: qty 0.3

## 2016-07-16 NOTE — Progress Notes (Addendum)
Cephalic Right  Upper Extremity Vein Map  Cephalic  Segment Diameter Depth Comment  1. Axilla 2.64mm 1.2cm   2. Mid upper arm 2.38mm 9.0 mm   3. Above AC 3.9 mm 3.52mm   4. In AC 3.27mm 5.85mm   5. Below AC 2.1mm 6.46mm   6. Mid forearm 2.24mm 6.67mm   7. Wrist mm mm Not visualized                  Basilic  Segment Diameter Depth Comment  1. Axilla 4.15mm 1.5 cm   2. Mid upper arm 3.70mm 1.6 cm branch  3. Above AC 2.33mm 9.52mm branch  4. In AC 2.81mm 5.61mm   5. Below AC 1.29mm mm   6. Mid forearm mm mm Not visualized  7. Wrist mm mm    mm mm    mm mm    mm mm     VASCULAR LAB PRELIMINARY  PRELIMINARY  PRELIMINARY  PRELIMINARY  Left Upper Extremity Vein Map  Cephalic  Segment Diameter Depth Comment  1. Axilla 2.12mm 1.1 cm   2. Mid upper arm 3.70mm 1.0 cm   3. Above [facility] 2.15mm 8.66mm  branch  4. In AC 2.52mm 3.34m   5. Below AC 1.17mm 7.10mm   6. Mid forearm 1.14mm 4.46mm   7. Wrist mm mm    mm mm    mm mm    mm mm    Basilic  Segment Diameter Depth Comment  1. Axilla 3.81mm 1.3 cm   2. Mid upper arm 2.56mm 1.5 cm   3. Above AC 2.21mm 1.1 cm   4. In AC 2.22mm 6.76mm   5. Below AC 1.31mm mm   6. Mid forearm mm mm   7. Wrist mm mm    mm mm    mm mm    mm mm     Lazar Tierce, Bonnye Fava, RVT 07/16/2016, 1:54 PM

## 2016-07-16 NOTE — Progress Notes (Signed)
Assessment/Plan: 83 year old female with a past medical history of COPD, oxygen dependent, diastolic CHF, diabetes, chronic kidney disease found to have AKI with hyperkalemia initially requiring CRRT. Volume status was not improving with Lasix therefore HD restarted 07/14/16   AKI on CKD 4. Oliguric.  Will commit to HD as OP - vascular surgery: L AVD and AVG placement 2/8 11am; recommend dialyzing pt early 2/8 AM  - discontinue Lasix   Anemia esa/Fe  CAD  DM   Subjective: Interval History: Had HD 2/6. No concerns this morning   Objective: Vital signs in last 24 hours: Temp:  [98.1 F (36.7 C)-99.3 F (37.4 C)] 98.3 F (36.8 C) (02/07 0545) Pulse Rate:  [56-86] 73 (02/07 0545) Resp:  [16-18] 17 (02/07 0545) BP: (83-123)/(41-89) 119/50 (02/07 0545) SpO2:  [98 %-100 %] 100 % (02/07 0545) Weight:  [72.2 kg (159 lb 2.8 oz)-74 kg (163 lb 2.3 oz)] 72.3 kg (159 lb 8 oz) (02/06 2044) Weight change: -2.7 kg (-5 lb 15.2 oz)  Intake/Output from previous day: 02/06 0701 - 02/07 0700 In: 57 [P.O.:820] Out: 1590 [Urine:150; Stool:1] Intake/Output this shift: No intake/output data recorded.  General appearance: alert and cooperative Heart: RRR, systolic murmur [id number] best hears at apex Lungs: diminished breath sounds at bases without crackles, normal effort Extremities: extremities normal, atraumatic, no cyanosis or edema, no sacral edema noted  Lab Results:  Recent Labs  07/14/16 1806 07/15/16 0930  WBC 6.9 6.1  HGB 8.0* 7.7*  HCT 26.4* 25.7*  PLT 275 288   BMET:   Recent Labs  07/15/16 0516 07/16/16 0417  NA 136 134*  K 4.3 3.5  CL 96* 93*  CO2 31 31  GLUCOSE 120* 102*  BUN 35* 21*  CREATININE 2.62* 1.88*  CALCIUM 8.2* 8.0*   No results for input(s): PTH in the last 72 hours. Iron Studies: No results for input(s): IRON, TIBC, TRANSFERRIN, FERRITIN in the last 72 hours. Studies/Results: No results found.  Scheduled: . arformoterol  15 mcg Nebulization BID  .  aspirin EC  81 mg Oral Daily  . atorvastatin  20 mg Oral q1800  . budesonide (PULMICORT) nebulizer solution  0.5 mg Nebulization BID  . darbepoetin (ARANESP) injection - NON-DIALYSIS  150 mcg Subcutaneous Q Wed-1800  . feeding supplement (NEPRO CARB STEADY)  237 mL Oral TID BM  . furosemide  160 mg Oral BID  . guaiFENesin  600 mg Oral BID  . heparin  5,000 Units Subcutaneous Q8H  . insulin aspart  0-9 Units Subcutaneous TID WC  . multivitamin with minerals  1 tablet Oral Daily  . mupirocin ointment  1 application Nasal BID  . sodium chloride  500 mL Intravenous Once  . sodium chloride flush  10-40 mL Intracatheter Q12H    LOS: 21 days   Smiley Houseman 07/16/2016,8:29 AM   Renal Attending: She is breathing and feeling better with hemodialysis.   We will proceed with permanent access and OP HD scheduling. Acxel Dingee C'

## 2016-07-16 NOTE — Progress Notes (Signed)
   VASCULAR SURGERY ASSESSMENT & PLAN:  Vein map and arterial duplex scan is still pending.  The patient is tentatively scheduled for left AV fistula or AV graft and placement of a tunneled dialysis catheter tomorrow.  Please dialyze patient early Thursday morning so that this will not interfere with her surgery which will likely be around 11:00.  SUBJECTIVE: No specific complaints.   PHYSICAL EXAM: Vitals:   07/15/16 1317 07/15/16 1645 07/15/16 2044 07/16/16 0545  BP: (!) 108/50 (!) 107/50 112/70 (!) 119/50  Pulse: 69 72 73 73  Resp: 16 16 17 17   Temp: 98.2 F (36.8 C) 98.8 F (37.1 C) 99.3 F (37.4 C) 98.3 F (36.8 C)  TempSrc: Oral Oral  Axillary  SpO2: 98% 100% 100% 100%  Weight: 159 lb 2.8 oz (72.2 kg)  159 lb 8 oz (72.3 kg)   Height:       Unable to palpate left radial pulse.   LABS: Lab Results  Component Value Date   WBC 6.1 07/15/2016   HGB 7.7 (L) 07/15/2016   HCT 25.7 (L) 07/15/2016   MCV 100.4 (H) 07/15/2016   PLT 288 07/15/2016   Lab Results  Component Value Date   CREATININE 1.88 (H) 07/16/2016   Lab Results  Component Value Date   INR 0.96 06/08/2016   CBG (last 3)   Recent Labs  07/15/16 1400 07/15/16 1645 07/15/16 2200  GLUCAP 107* 156* 124*    Active Problems:   Renal failure   AKI (acute kidney injury) ([place])   Hyperkalemia   Atrial fibrillation (HCC)   Bradycardia   Central venous catheter in place   Gae Gallop Beeper: [id number] 07/16/2016

## 2016-07-16 NOTE — Plan of Care (Signed)
Problem: Food- and Nutrition-Related Knowledge Deficit (NB-1.1) Goal: Nutrition education Formal process to instruct or train a patient/client in a skill or to impart knowledge to help patients/clients voluntarily manage or modify food choices and eating behavior to maintain or improve health. Outcome: Completed/Met Date Met: 07/16/16 Nutrition Education Note  RD consulted for Renal Education. Provided "Eating Healthy with Kidney Diease" handout to patient/family. Reviewed food groups and provided written recommended serving sizes specifically determined for patient's current nutritional status.   Explained why diet restrictions are needed and provided lists of foods to limit/avoid that are high potassium, sodium, and phosphorus. Provided specific recommendations on safer alternatives of these foods. Strongly encouraged compliance of this diet.   Discussed importance of protein intake at each meal and snack. Provided examples of how to maximize protein intake throughout the day. Discussed need for fluid restriction with dialysis and renal-friendly beverage options.  Encouraged pt to discuss specific diet questions/concerns with RD at HD outpatient facility. Teach back method used.  Expect good compliance.  Corrin Parker, MS, RD, LDN Pager # [phone] After hours/ weekend pager # [phone]

## 2016-07-16 NOTE — Progress Notes (Signed)
Debbie [name] notified that pt requesting something for sleep.  Will continue to monitor.

## 2016-07-16 NOTE — Progress Notes (Signed)
[facility] PROGRESS NOTE  Debbie [name] [id number] DOB: 12-23-1930 DOA: 06/25/2016  PCP: Myriam Jacobson, MD  Brief History/Interval Summary: 83 year old female with a past medical history of COPD, oxygen dependent, diastolic CHF, diabetes, chronic kidney disease, presented with shortness of breath and was found to have low oxygen levels and low blood pressure. She was found to have acute on chronic kidney disease. Initially admitted to step down then had to be transferred to ICU due to hypotension. Started on hemodialysis. She is being on the dialysis, improvement, diuresis worsening cycle twice, currently planning for tunneled HD catheter AVF and outpatient dialysis.  Reason for Visit: Acute renal failure  Consultants: Nephrology. Vascular surgery.  Procedures: Hemodialysis  Antibiotics: None  Subjective/Interval History: Patient feels well. States that her breathing is significantly improved. Denies any nausea, vomiting  ROS: No diarrhea  Objective:  Vital Signs  Vitals:   07/15/16 1645 07/15/16 2044 07/16/16 0545 07/16/16 0846  BP: (!) 107/50 112/70 (!) 119/50 (!) 128/49  Pulse: 72 73 73 85  Resp: 16 17 17 17   Temp: 98.8 F (37.1 C) 99.3 F (37.4 C) 98.3 F (36.8 C) 98.5 F (36.9 C)  TempSrc: Oral  Axillary Oral  SpO2: 100% 100% 100% 97%  Weight:  72.3 kg (159 lb 8 oz)    Height:        Intake/Output Summary (Last 24 hours) at 07/16/16 1653 Last data filed at 07/16/16 1520  Gross per 24 hour  Intake              640 ml  Output              576 ml  Net               64 ml   Filed Weights   07/15/16 0930 07/15/16 1317 07/15/16 2044  Weight: 74 kg (163 lb 2.3 oz) 72.2 kg (159 lb 2.8 oz) 72.3 kg (159 lb 8 oz)    General appearance: alert, cooperative, appears stated age and no distress Resp: Much improved air entry bilaterally. Few crackles at the bases. No wheezing. Cardio: regular rate and rhythm, S1, S2 normal, no murmur, click, rub  or gallop GI: soft, non-tender; bowel sounds normal; no masses,  no organomegaly Extremities: extremities normal, atraumatic, no cyanosis or edema Neurologic: No focal deficits  Lab Results:  Data Reviewed: I have personally reviewed following labs and imaging studies  CBC:  Recent Labs Lab 07/11/16 0447 07/14/16 0241 07/14/16 1806 07/15/16 0930  WBC 6.1 5.5 6.9 6.1  HGB 7.5* 7.1* 8.0* 7.7*  HCT 25.1* 23.6* 26.4* 25.7*  MCV 99.2 100.4* 99.2 100.4*  PLT 269 274 275 [id number]    Basic Metabolic Panel:  Recent Labs Lab 07/13/16 0405 07/14/16 0241 07/14/16 1558 07/15/16 0516 07/16/16 0417  NA 137 135 136 136 134*  K 3.8 3.6 3.8 4.3 3.5  CL 93* 91* 91* 96* 93*  CO2 34* 34* 33* 31 31  GLUCOSE 110* 115* 134* 120* 102*  BUN 74* 80* 69* 35* 21*  CREATININE 4.32* 4.72* 4.20* 2.62* 1.88*  CALCIUM 9.0 8.9 8.5* 8.2* 8.0*  PHOS 1.5* 1.8* 1.9* 2.0* 1.8*    GFR: Estimated Creatinine Clearance: 22.2 mL/min (by C-G formula based on SCr of 1.88 mg/dL (H)).  Liver Function Tests:  Recent Labs Lab 07/13/16 0405 07/14/16 0241 07/14/16 1558 07/15/16 0516 07/16/16 0417  ALBUMIN 2.2* 2.4* 2.4* 2.4* 2.4*    CBG:  Recent Labs Lab 07/15/16 1645 07/15/16 2200 07/16/16  0756 07/16/16 1139 07/16/16 1621  GLUCAP 156* 124* 109* 145* 136*     Recent Results (from the past 240 hour(s))  MRSA PCR Screening     Status: Abnormal   Collection Time: 07/12/16  6:15 AM  Result Value Ref Range Status   MRSA by PCR POSITIVE (A) NEGATIVE Final    Comment:        The GeneXpert MRSA Assay (FDA approved for NASAL specimens only), is one component of a comprehensive MRSA colonization surveillance program. It is not intended to diagnose MRSA infection nor to guide or monitor treatment for MRSA infections. RESULT CALLED TO, READ BACK BY AND VERIFIED WITH: A. BRAKE 0759 02.03.2018 N. MORRIS       Radiology Studies: No results found.   Medications:  Scheduled: . arformoterol   15 mcg Nebulization BID  . aspirin EC  81 mg Oral Daily  . atorvastatin  20 mg Oral q1800  . budesonide (PULMICORT) nebulizer solution  0.5 mg Nebulization BID  . [START ON 07/17/2016]  ceFAZolin (ANCEF) IV  2 g Intravenous To SS-Surg  . [START ON 07/17/2016] darbepoetin (ARANESP) injection - DIALYSIS  150 mcg Intravenous Q Thu-HD  . feeding supplement (NEPRO CARB STEADY)  237 mL Oral TID BM  . guaiFENesin  600 mg Oral BID  . heparin  5,000 Units Subcutaneous Q8H  . insulin aspart  0-9 Units Subcutaneous TID WC  . multivitamin with minerals  1 tablet Oral Daily  . mupirocin ointment  1 application Nasal BID  . sodium chloride  500 mL Intravenous Once  . sodium chloride flush  10-40 mL Intracatheter Q12H   Continuous:  [id number] chloride, sodium chloride, acetaminophen **OR** acetaminophen, albuterol, lidocaine (PF), ondansetron **OR** ondansetron (ZOFRAN) IV, sodium chloride, sodium chloride flush  Assessment/Plan:  Active Problems:   Renal failure   AKI (acute kidney injury) ([place])   Hyperkalemia   Atrial fibrillation (HCC)   Bradycardia   Central venous catheter in place    Acute kidney injury on chronic kidney disease, stage 3 -Baseline creatinine is 2.1 from 1/5, presented with creatinine of 6.9. -Initially admitted to the ICU because of hypotension started on CRRT. -Nephrology following. She was placed on high dose Lasix. She was getting intermittent hemodialysis. No longer on Lasix. -Now deemed to have end-stage renal disease. Will need long-term dialysis. Vascular surgery consulted. AV fistula to be created and a more permanent catheter to be placed tomorrow.  Acute on chronic hypoxic resp failure -From reviewing the notes patient started on oxygen when she was discharged from the hospital on Christmas of 2017 -Currently sats 100% on 2 L try to wean off of oxygen  Biateral pleural effusion -Likely due to fluid overload. Patient has had pleural effusions since December.    -Bilateral effusion, likely thoracentesis is not going to help, this is likely will improve with dialysis.  Chronic Diastolic heart failure -This is based off of an echocardiogram from December. Previously on torsemide.  Volume being managed with hemodialysis.  Episodic bradycardia -Thought to be secondary to Hyperkalemia versus beta blocker. Resolved.  Recent H flu +/-HCAP -Stable  History of COPD  -Stable. Apparently on home oxygen. Continue home medications. No wheezing noted. Continue current medications.  Anemia of chronic disease -Hemoglobin is low but stable. No evidence for overt bleeding. Low iron and ferritin levels noted. Parenteral Iron was given.   History of Endometrial adenocarcinoma status post XRT -Stable, just finished XRT with Dr. Sondra Come on 05/22/16.  DVT Prophylaxis: Subcutaneous heparin  Code Status: Full code  Family Communication: Discussed with patient and son Disposition Plan: AV fistula and catheter placement tomorrow. She will need to be established with a dialysis clinic. Then could be discharged. Hopefully in the next few days.    LOS: 21 days   [facility] Pager [phone] 07/16/2016, 4:53 PM  If 7PM-7AM, please contact night-coverage at [email or web address], password [facility]

## 2016-07-16 NOTE — Progress Notes (Signed)
Nutrition Follow-up  DOCUMENTATION CODES:   Obesity unspecified  INTERVENTION:  Continue Nepro Shake po TID, each supplement provides 425 kcal and 19 grams protein.  Renal diet education given.  NUTRITION DIAGNOSIS:   Inadequate oral intake related to acute illness, poor appetite as evidenced by per patient/family report, meal completion < 50%; improving  GOAL:   Patient will meet greater than or equal to 90% of their needs; met  MONITOR:   PO intake, Supplement acceptance, Labs, Weight trends  REASON FOR ASSESSMENT:   Consult Poor PO  ASSESSMENT:   83 yo female admitted with dypsnea and hyperkalemia with volue overload; pt with acute on CKD III. Pt with additional hx of DM, COPD, HTN, endometrial and vaginal cancer, PVD Required CRRT 1/17-1/19, transitioned to intermittent HD  Plans for AV fistula/graft for tunneled dialysis catheter tomorrow. Last HD 2/6. Pt reports appetite has been improving. Meal completion has been varied from 45-100% with 95% at breakfast this AM. Pt currently has Nepro shake ordered and has been consuming them. RD to continue with current orders. RD additionally consulted for diet education regarding the renal diet. Education given to pt and son at bedside.   Labs and medications reviewed. Phosphorous low at 1.8.  Diet Order:  Diet Carb Modified Fluid consistency: Thin; Room service appropriate? Yes; Fluid restriction: 1200 mL Fluid Diet NPO time specified Except for: Sips with Meds  Skin:  Wound (see comment) (Stage II on R ear, stage I on coccyx)  Last BM:  2/7  Height:   Ht Readings from Last 1 Encounters:  06/27/16 5' 5"  (1.651 m)    Weight:   Wt Readings from Last 1 Encounters:  07/15/16 159 lb 8 oz (72.3 kg)    Ideal Body Weight:  56.8 kg  BMI:  Body mass index is 26.54 kg/m.  Estimated Nutritional Needs:   Kcal:  1700-2000 kcals  Protein:  94-117 g   Fluid:  1.2 L/day  EDUCATION NEEDS:   Education needs  addressed  Corrin Parker, MS, RD, LDN Pager # [phone] After hours/ weekend pager # [phone]

## 2016-07-16 NOTE — Progress Notes (Signed)
Physical Therapy Treatment Patient Details Name: Debbie [name] MRN: [id number] DOB: 12/13/1930 Today's Date: 07/16/2016    History of Present Illness Pt is an 83 y/o female admitted from a SNF secondary to dyspnea and hyperkalemia. 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    PT Comments    More encouragement needed.  Pt appearing more tire, but agreed to mobilize in the halls.  Not assist needed and sats maintained over 92%, but with some dyspnea.  Follow Up Recommendations  Home health PT;Supervision/Assistance - 24 hour     Equipment Recommendations  3in1 (PT)    Recommendations for Other Services       Precautions / Restrictions Precautions Precautions: Fall Precaution Comments: supplemental O2    Mobility  Bed Mobility Overal bed mobility: Needs Assistance Bed Mobility: Sit to Supine;Supine to Sit     Supine to sit: Min guard Sit to supine: Supervision   General bed mobility comments: With rail no struggle getting up.  Transfers Overall transfer level: Needs assistance Equipment used: Rolling walker (2 wheeled) Transfers: Sit to/from Stand Sit to Stand: Min assist         General transfer comment: Stability assist and cues for hand placement  Ambulation/Gait Ambulation/Gait assistance: Min guard Ambulation Distance (Feet): 150 Feet Assistive device: Rolling walker (2 wheeled) Gait Pattern/deviations: Step-through pattern Gait velocity: slower Gait velocity interpretation: Below normal speed for age/gender General Gait Details: generally steady, but guarded and more tired   Stairs            Wheelchair Mobility    Modified Rankin (Stroke Patients Only)       Balance     Sitting balance-Leahy Scale: Fair     Standing balance support: Bilateral upper extremity supported Standing balance-Leahy  Scale: Poor Standing balance comment: reliant on the RW                    Cognition Arousal/Alertness: Awake/alert Behavior During Therapy: WFL for tasks assessed/performed (tired) Overall Cognitive Status: Within Functional Limits for tasks assessed                      Exercises      General Comments        Pertinent Vitals/Pain Pain Assessment: Faces Faces Pain Scale: No hurt    Home Living                      Prior Function            PT Goals (current goals can now be found in the care plan section) Acute Rehab PT Goals PT Goal Formulation: With patient/family Time For Goal Achievement: 07/21/16 Potential to Achieve Goals: Fair Progress towards PT goals: Progressing toward goals    Frequency    Min 3X/week      PT Plan Current plan remains appropriate    Co-evaluation             End of Session Equipment Utilized During Treatment: Oxygen Activity Tolerance: Patient tolerated treatment well Patient left: in bed;with call bell/phone within reach;with bed alarm set     Time: [id number] PT Time Calculation (min) (ACUTE ONLY): 15 min  Charges:  $Gait Training: 8-22 mins                    G Codes:  Tessie Fass [place] 07/16/2016, 6:51 PM 07/16/2016  Donnella Sham, PT [phone]  (pager)

## 2016-07-16 NOTE — Progress Notes (Addendum)
Preliminary results by tech - Arterial Duplex Upper Ext. Completed. Right arm demonstrated multiphasic waveforms throughout the arterial system without a significant stenosis noted. Left arm, proximal subclavian artery demonstrated abnormal monophasic flow suggestive of more proximal occlusion verses high grade stenosis. The axillary, brachial, radial and ulnar arteries all appeared patent with abnormal monophasic flow demonstrated throughout without significant stenosis noted. Both arms appeared to demonstrated a high bifurcation of  the radial and ulnar arteries - upper humerus level.  Oda Cogan, BS, RDMS, RVT

## 2016-07-17 ENCOUNTER — Inpatient Hospital Stay (HOSPITAL_COMMUNITY): Payer: Medicare HMO | Admitting: Certified Registered Nurse Anesthetist

## 2016-07-17 ENCOUNTER — Inpatient Hospital Stay (HOSPITAL_COMMUNITY): Payer: Medicare HMO

## 2016-07-17 ENCOUNTER — Encounter (HOSPITAL_COMMUNITY)
Admission: EM | Disposition: A | Discharge status: Home / Self Care | Payer: Self-pay | Source: Home / Self Care | Attending: Internal Medicine

## 2016-07-17 ENCOUNTER — Other Ambulatory Visit: Payer: Self-pay

## 2016-07-17 DIAGNOSIS — N185 Chronic kidney disease, stage 5: Secondary | ICD-10-CM

## 2016-07-17 DIAGNOSIS — Z48812 Encounter for surgical aftercare following surgery on the circulatory system: Secondary | ICD-10-CM

## 2016-07-17 HISTORY — PX: INSERTION OF DIALYSIS CATHETER: SHX1324

## 2016-07-17 HISTORY — PX: AV FISTULA PLACEMENT: SHX1204

## 2016-07-17 LAB — CBC
HCT: 34.2 % — ABNORMAL LOW (ref 36.0–46.0)
HEMATOCRIT: 22.3 % — AB (ref 36.0–46.0)
HEMOGLOBIN: 10.9 g/dL — AB (ref 12.0–15.0)
Hemoglobin: 6.8 g/dL — CL (ref 12.0–15.0)
MCH: 30.4 pg (ref 26.0–34.0)
MCH: 30.4 pg (ref 26.0–34.0)
MCHC: 30.5 g/dL (ref 30.0–36.0)
MCHC: 31.9 g/dL (ref 30.0–36.0)
MCV: 99.6 fL (ref 78.0–100.0)
MCV: 95.3 fL (ref 78.0–100.0)
Platelets: 219 10*3/uL (ref 150–400)
Platelets: 208 10*3/uL (ref 150–400)
RBC: 2.24 MIL/uL — ABNORMAL LOW (ref 3.87–5.11)
RBC: 3.59 MIL/uL — ABNORMAL LOW (ref 3.87–5.11)
RDW: 20.5 % — AB (ref 11.5–15.5)
RDW: 19.7 % — AB (ref 11.5–15.5)
WBC: 5.4 10*3/uL (ref 4.0–10.5)
WBC: 8.8 10*3/uL (ref 4.0–10.5)

## 2016-07-17 LAB — RENAL FUNCTION PANEL
ALBUMIN: 2.6 g/dL — AB (ref 3.5–5.0)
ANION GAP: 10 (ref 5–15)
Albumin: 2.2 g/dL — ABNORMAL LOW (ref 3.5–5.0)
Anion gap: 10 (ref 5–15)
BUN: 34 mg/dL — AB (ref 6–20)
BUN: 21 mg/dL — ABNORMAL HIGH (ref 6–20)
CALCIUM: 8.5 mg/dL — AB (ref 8.9–10.3)
CHLORIDE: 98 mmol/L — AB (ref 101–111)
CO2: 27 mmol/L (ref 22–32)
CO2: 28 mmol/L (ref 22–32)
CREATININE: 2.72 mg/dL — AB (ref 0.44–1.00)
CREATININE: 1.82 mg/dL — AB (ref 0.44–1.00)
Calcium: 7.9 mg/dL — ABNORMAL LOW (ref 8.9–10.3)
Chloride: 96 mmol/L — ABNORMAL LOW (ref 101–111)
GFR calc Af Amer: 17 mL/min — ABNORMAL LOW (ref >60)
GFR calc Af Amer: 28 mL/min — ABNORMAL LOW (ref >60)
GFR calc non Af Amer: 24 mL/min — ABNORMAL LOW (ref >60)
GFR, EST NON AFRICAN AMERICAN: 15 mL/min — AB (ref >60)
GLUCOSE: 322 mg/dL — AB (ref 65–99)
Glucose, Bld: 94 mg/dL (ref 65–99)
PHOSPHORUS: 2.5 mg/dL (ref 2.5–4.6)
POTASSIUM: 3.3 mmol/L — AB (ref 3.5–5.1)
Phosphorus: 2.9 mg/dL (ref 2.5–4.6)
Potassium: 4.2 mmol/L (ref 3.5–5.1)
SODIUM: 134 mmol/L — AB (ref 135–145)
Sodium: 135 mmol/L (ref 135–145)

## 2016-07-17 LAB — HEMOGLOBIN AND HEMATOCRIT, BLOOD
HCT: 23.7 % — ABNORMAL LOW (ref 36.0–46.0)
Hemoglobin: 7.3 g/dL — ABNORMAL LOW (ref 12.0–15.0)

## 2016-07-17 LAB — GLUCOSE, CAPILLARY
GLUCOSE-CAPILLARY: 129 mg/dL — AB (ref 65–99)
GLUCOSE-CAPILLARY: 158 mg/dL — AB (ref 65–99)
GLUCOSE-CAPILLARY: 304 mg/dL — AB (ref 65–99)
Glucose-Capillary: 101 mg/dL — ABNORMAL HIGH (ref 65–99)

## 2016-07-17 LAB — PREPARE RBC (CROSSMATCH)

## 2016-07-17 LAB — PROTIME-INR
INR: 1.03
Prothrombin Time: 13.5 seconds (ref 11.4–15.2)

## 2016-07-17 SURGERY — ARTERIOVENOUS (AV) FISTULA CREATION
Anesthesia: Monitor Anesthesia Care | Site: Neck | Laterality: Right

## 2016-07-17 MED ORDER — FENTANYL CITRATE (PF) 100 MCG/2ML IJ SOLN: 25.0000 ug | INTRAMUSCULAR | Status: DC | PRN

## 2016-07-17 MED ORDER — DARBEPOETIN ALFA 150 MCG/0.3ML IJ SOSY
PREFILLED_SYRINGE | INTRAMUSCULAR | Status: AC
  Filled 2016-07-17: qty 0.3

## 2016-07-17 MED ORDER — HEPARIN SODIUM (PORCINE) 1000 UNIT/ML IJ SOLN
INTRAMUSCULAR | Status: DC | PRN
  Administered 2016-07-17: 1000 [IU] via INTRAVENOUS

## 2016-07-17 MED ORDER — LIDOCAINE HCL (PF) 1 % IJ SOLN: 5.0000 mL | INTRAMUSCULAR | Status: DC | PRN

## 2016-07-17 MED ORDER — PENTAFLUOROPROP-TETRAFLUOROETH EX AERO: 1.0000 "application " | INHALATION_SPRAY | CUTANEOUS | Status: DC | PRN

## 2016-07-17 MED ORDER — HEPARIN SODIUM (PORCINE) 1000 UNIT/ML DIALYSIS: 1000.0000 [IU] | INTRAMUSCULAR | Status: DC | PRN

## 2016-07-17 MED ORDER — MIDAZOLAM HCL 2 MG/2ML IJ SOLN
INTRAMUSCULAR | Status: AC
  Filled 2016-07-17: qty 2

## 2016-07-17 MED ORDER — SODIUM CHLORIDE 0.9 % IV SOLN: Freq: Once | INTRAVENOUS | Status: DC

## 2016-07-17 MED ORDER — HEPARIN SODIUM (PORCINE) 5000 UNIT/ML IJ SOLN
INTRAMUSCULAR | Status: DC | PRN
  Administered 2016-07-17: 500 mL

## 2016-07-17 MED ORDER — DEXAMETHASONE SODIUM PHOSPHATE 10 MG/ML IJ SOLN
INTRAMUSCULAR | Status: DC | PRN
  Administered 2016-07-17: 5 mg via INTRAVENOUS

## 2016-07-17 MED ORDER — ONDANSETRON HCL 4 MG/2ML IJ SOLN
INTRAMUSCULAR | Status: DC | PRN
  Administered 2016-07-17: 4 mg via INTRAVENOUS

## 2016-07-17 MED ORDER — KETAMINE HCL 10 MG/ML IJ SOLN
INTRAMUSCULAR | Status: DC | PRN
  Administered 2016-07-17: 20 mg via INTRAVENOUS
  Administered 2016-07-17: 30 mg via INTRAVENOUS

## 2016-07-17 MED ORDER — SODIUM CHLORIDE 0.9 % IV SOLN: 100.0000 mL | INTRAVENOUS | Status: DC | PRN

## 2016-07-17 MED ORDER — PHENYLEPHRINE HCL 10 MG/ML IJ SOLN
INTRAMUSCULAR | Status: DC | PRN
  Administered 2016-07-17: 120 ug via INTRAVENOUS

## 2016-07-17 MED ORDER — ONDANSETRON HCL 4 MG/2ML IJ SOLN
INTRAMUSCULAR | Status: AC
  Filled 2016-07-17: qty 2

## 2016-07-17 MED ORDER — LIDOCAINE HCL (PF) 1 % IJ SOLN
INTRAMUSCULAR | Status: AC
  Filled 2016-07-17: qty 30

## 2016-07-17 MED ORDER — LIDOCAINE-PRILOCAINE 2.5-2.5 % EX CREA: 1.0000 "application " | TOPICAL_CREAM | CUTANEOUS | Status: DC | PRN

## 2016-07-17 MED ORDER — LIDOCAINE-EPINEPHRINE (PF) 1 %-1:200000 IJ SOLN
INTRAMUSCULAR | Status: AC
  Filled 2016-07-17: qty 30

## 2016-07-17 MED ORDER — MIDAZOLAM HCL 2 MG/2ML IJ SOLN
INTRAMUSCULAR | Status: DC | PRN
  Administered 2016-07-17: 2 mg via INTRAVENOUS

## 2016-07-17 MED ORDER — SODIUM CHLORIDE 0.9 % IV SOLN
INTRAVENOUS | Status: DC
  Administered 2016-07-17: 13:00:00 via INTRAVENOUS

## 2016-07-17 MED ORDER — PROPOFOL 10 MG/ML IV BOLUS
INTRAVENOUS | Status: DC | PRN
  Administered 2016-07-17: 20 mg via INTRAVENOUS
  Administered 2016-07-17 (×2): 10 mg via INTRAVENOUS
  Administered 2016-07-17 (×2): 20 mg via INTRAVENOUS

## 2016-07-17 MED ORDER — CEFAZOLIN IN D5W 1 GM/50ML IV SOLN
1.0000 g | INTRAVENOUS | Status: AC
  Administered 2016-07-17: 1 g via INTRAVENOUS
  Filled 2016-07-17 (×2): qty 50

## 2016-07-17 MED ORDER — HEPARIN SODIUM (PORCINE) 1000 UNIT/ML DIALYSIS: 20.0000 [IU]/kg | INTRAMUSCULAR | Status: DC | PRN

## 2016-07-17 MED ORDER — FENTANYL CITRATE (PF) 100 MCG/2ML IJ SOLN
INTRAMUSCULAR | Status: AC
  Filled 2016-07-17: qty 2

## 2016-07-17 MED ORDER — HEPARIN SODIUM (PORCINE) 1000 UNIT/ML IJ SOLN
INTRAMUSCULAR | Status: AC
  Filled 2016-07-17: qty 1

## 2016-07-17 MED ORDER — IOPAMIDOL (ISOVUE-300) INJECTION 61%
INTRAVENOUS | Status: AC
  Filled 2016-07-17: qty 50

## 2016-07-17 MED ORDER — LIDOCAINE-EPINEPHRINE (PF) 1 %-1:200000 IJ SOLN
INTRAMUSCULAR | Status: DC | PRN
  Administered 2016-07-17: 10 mL

## 2016-07-17 MED ORDER — FENTANYL CITRATE (PF) 100 MCG/2ML IJ SOLN
INTRAMUSCULAR | Status: DC | PRN
  Administered 2016-07-17: 25 ug via INTRAVENOUS

## 2016-07-17 MED ORDER — PROPOFOL 500 MG/50ML IV EMUL
INTRAVENOUS | Status: DC | PRN
  Administered 2016-07-17: 100 ug/kg/min via INTRAVENOUS

## 2016-07-17 MED ORDER — PHENYLEPHRINE HCL 10 MG/ML IJ SOLN
INTRAVENOUS | Status: DC | PRN
  Administered 2016-07-17: 50 ug/min via INTRAVENOUS

## 2016-07-17 MED ORDER — HEPARIN SODIUM (PORCINE) 1000 UNIT/ML IJ SOLN
INTRAMUSCULAR | Status: DC | PRN
  Administered 2016-07-17: 5000 [IU] via INTRAVENOUS

## 2016-07-17 MED ORDER — LIDOCAINE HCL (PF) 1 % IJ SOLN
INTRAMUSCULAR | Status: DC | PRN
  Administered 2016-07-17: 30 mL

## 2016-07-17 MED ORDER — 0.9 % SODIUM CHLORIDE (POUR BTL) OPTIME
TOPICAL | Status: DC | PRN
  Administered 2016-07-17: 1000 mL

## 2016-07-17 MED ORDER — THROMBIN 20000 UNITS EX SOLR
CUTANEOUS | Status: AC
  Filled 2016-07-17: qty 20000

## 2016-07-17 MED ORDER — PROTAMINE SULFATE 10 MG/ML IV SOLN
INTRAVENOUS | Status: DC | PRN
  Administered 2016-07-17 (×3): 10 mg via INTRAVENOUS

## 2016-07-17 MED ORDER — KETAMINE HCL-SODIUM CHLORIDE 100-0.9 MG/10ML-% IV SOSY
PREFILLED_SYRINGE | INTRAVENOUS | Status: AC
  Filled 2016-07-17: qty 10

## 2016-07-17 MED ORDER — ALTEPLASE 2 MG IJ SOLR: 2.0000 mg | Freq: Once | INTRAMUSCULAR | Status: DC | PRN

## 2016-07-17 SURGICAL SUPPLY — 59 items
ADH SKN CLS APL DERMABOND .7 (GAUZE/BANDAGES/DRESSINGS) ×2
ARMBAND PINK RESTRICT EXTREMIT (MISCELLANEOUS) ×8 IMPLANT
BAG DECANTER FOR FLEXI CONT (MISCELLANEOUS) ×4 IMPLANT
BIOPATCH RED 1 DISK 7.0 (GAUZE/BANDAGES/DRESSINGS) ×3 IMPLANT
BIOPATCH RED 1IN DISK 7.0MM (GAUZE/BANDAGES/DRESSINGS) ×1
BLADE SURG 10 STRL SS (BLADE) ×2 IMPLANT
CANISTER SUCTION 2500CC (MISCELLANEOUS) ×4 IMPLANT
CANNULA VESSEL 3MM 2 BLNT TIP (CANNULA) ×4 IMPLANT
CATH PALINDROME RT-P 15FX19CM (CATHETERS) IMPLANT
CATH PALINDROME RT-P 15FX23CM (CATHETERS) ×2 IMPLANT
CATH PALINDROME RT-P 15FX28CM (CATHETERS) IMPLANT
CATH PALINDROME RT-P 15FX55CM (CATHETERS) IMPLANT
CHLORAPREP W/TINT 26ML (MISCELLANEOUS) ×4 IMPLANT
CLIP TI MEDIUM 6 (CLIP) ×4 IMPLANT
CLIP TI WIDE RED SMALL 6 (CLIP) ×4 IMPLANT
COVER PROBE W GEL 5X96 (DRAPES) IMPLANT
COVER SURGICAL LIGHT HANDLE (MISCELLANEOUS) ×6 IMPLANT
DECANTER SPIKE VIAL GLASS SM (MISCELLANEOUS) ×4 IMPLANT
DERMABOND ADVANCED (GAUZE/BANDAGES/DRESSINGS) ×2
DERMABOND ADVANCED .7 DNX12 (GAUZE/BANDAGES/DRESSINGS) ×2 IMPLANT
DRAPE C-ARM 42X72 X-RAY (DRAPES) ×4 IMPLANT
DRAPE CHEST BREAST 15X10 FENES (DRAPES) ×4 IMPLANT
ELECT REM PT RETURN 9FT ADLT (ELECTROSURGICAL) ×4
ELECTRODE REM PT RTRN 9FT ADLT (ELECTROSURGICAL) ×2 IMPLANT
GAUZE SPONGE 4X4 16PLY XRAY LF (GAUZE/BANDAGES/DRESSINGS) ×6 IMPLANT
GLOVE BIO SURGEON STRL SZ7.5 (GLOVE) ×4 IMPLANT
GLOVE BIOGEL PI IND STRL 6.5 (GLOVE) IMPLANT
GLOVE BIOGEL PI IND STRL 8 (GLOVE) ×2 IMPLANT
GLOVE BIOGEL PI INDICATOR 6.5 (GLOVE) ×2
GLOVE BIOGEL PI INDICATOR 8 (GLOVE) ×2
GLOVE SURG SS PI 6.5 STRL IVOR (GLOVE) ×4 IMPLANT
GOWN STRL REUS W/ TWL LRG LVL3 (GOWN DISPOSABLE) ×6 IMPLANT
GOWN STRL REUS W/TWL LRG LVL3 (GOWN DISPOSABLE) ×12
KIT BASIN OR (CUSTOM PROCEDURE TRAY) ×4 IMPLANT
KIT ROOM TURNOVER OR (KITS) ×4 IMPLANT
LIQUID BAND (GAUZE/BANDAGES/DRESSINGS) ×2 IMPLANT
NDL 18GX1X1/2 (RX/OR ONLY) (NEEDLE) ×2 IMPLANT
NDL HYPO 25GX1X1/2 BEV (NEEDLE) ×2 IMPLANT
NEEDLE 18GX1X1/2 (RX/OR ONLY) (NEEDLE) ×8 IMPLANT
NEEDLE 22X1 1/2 (OR ONLY) (NEEDLE) ×4 IMPLANT
NEEDLE HYPO 25GX1X1/2 BEV (NEEDLE) ×4 IMPLANT
NS IRRIG 1000ML POUR BTL (IV SOLUTION) ×4 IMPLANT
PACK CV ACCESS (CUSTOM PROCEDURE TRAY) ×4 IMPLANT
PACK SURGICAL SETUP 50X90 (CUSTOM PROCEDURE TRAY) ×4 IMPLANT
PAD ARMBOARD 7.5X6 YLW CONV (MISCELLANEOUS) ×8 IMPLANT
SPONGE SURGIFOAM ABS GEL 100 (HEMOSTASIS) IMPLANT
SUT ETHILON 3 0 PS 1 (SUTURE) ×4 IMPLANT
SUT PROLENE 6 0 BV (SUTURE) ×4 IMPLANT
SUT VIC AB 3-0 SH 27 (SUTURE) ×4
SUT VIC AB 3-0 SH 27X BRD (SUTURE) ×2 IMPLANT
SUT VICRYL 4-0 PS2 18IN ABS (SUTURE) ×6 IMPLANT
SYR 20CC LL (SYRINGE) ×8 IMPLANT
SYR 5ML LL (SYRINGE) ×8 IMPLANT
SYR CONTROL 10ML LL (SYRINGE) ×4 IMPLANT
SYRINGE 10CC LL (SYRINGE) ×6 IMPLANT
TOWEL OR 17X24 6PK STRL BLUE (TOWEL DISPOSABLE) ×4 IMPLANT
TOWEL OR 17X26 10 PK STRL BLUE (TOWEL DISPOSABLE) ×4 IMPLANT
UNDERPAD 30X30 (UNDERPADS AND DIAPERS) ×4 IMPLANT
WATER STERILE IRR 1000ML POUR (IV SOLUTION) ×4 IMPLANT

## 2016-07-17 NOTE — Transfer of Care (Signed)
Immediate Anesthesia Transfer of Care Note  Patient: Debbie [name]  Procedure(s) Performed: Procedure(s): LEFT ARM ARTERIOVENOUS (AV) FISTULA CREATION (Left) INSERTION OF DIALYSIS CATHETER (Right)  Patient Location: PACU  Anesthesia Type:MAC  Level of Consciousness: awake, alert , oriented and patient cooperative  Airway & Oxygen Therapy: Patient Spontanous Breathing and Patient connected to nasal cannula oxygen  Post-op Assessment: Report given to RN, Post -op Vital signs reviewed and stable and Patient moving all extremities X 4  Post vital signs: Reviewed and stable  Last Vitals:  Vitals:   07/17/16 1125 07/17/16 1155  BP: (!) 112/45 (!) 151/55  Pulse: 66 68  Resp: 18 18  Temp: 36.8 C 36.6 C    Last Pain:  Vitals:   07/17/16 1155  TempSrc: Oral  PainSc:          Complications: No apparent anesthesia complications

## 2016-07-17 NOTE — Progress Notes (Signed)
Call to Dr Scot Dock for CXR order

## 2016-07-17 NOTE — Anesthesia Postprocedure Evaluation (Signed)
Anesthesia Post Note  Patient: Debbie [name]  Procedure(s) Performed: Procedure(s) (LRB): LEFT ARM ARTERIOVENOUS (AV) FISTULA CREATION (Left) INSERTION OF DIALYSIS CATHETER (Right)  Patient location during evaluation: PACU Anesthesia Type: MAC Level of consciousness: awake and alert Pain management: pain level controlled Vital Signs Assessment: post-procedure vital signs reviewed and stable Respiratory status: spontaneous breathing, nonlabored ventilation, respiratory function stable and patient connected to nasal cannula oxygen Cardiovascular status: stable and blood pressure returned to baseline Anesthetic complications: no       Last Vitals:  Vitals:   07/17/16 1605 07/17/16 1620  BP: (!) 117/99 (!) 116/58  Pulse: 68 63  Resp: 18 16  Temp:      Last Pain:  Vitals:   07/17/16 1530  TempSrc:   PainSc: 0-No pain                 Pansy Ostrovsky,W. EDMOND

## 2016-07-17 NOTE — Progress Notes (Signed)
VASCULAR SURGERY:  Her upper extremity arterial duplex scan shows evidence of a proximal left subclavian artery stenosis. The patient has multiphasic waveforms in the right upper extremity and therefore I have recommended placing access in the right arm.  Based on a preoperative vein map the patient may potentially be a candidate for a right brachiocephalic AV fistula.  In addition, we will change her temporary catheter into a tunneled dialysis catheter.  [name], MD, [place] [phone] Office: [phone]

## 2016-07-17 NOTE — Progress Notes (Signed)
CRITICAL VALUE ALERT  Critical value received:  Hgb 6.8  Date of notification:  07/17/16  Time of notification:  0550  Critical value read back: Yes  Nurse who received alert:  Laveda Norman RN  MD notified (1st page):  Tylene Fantasia  Time of first page:  [phone]

## 2016-07-17 NOTE — Op Note (Signed)
    NAME: Debbie [name]   MRN: [id number] DOB: 04/04/1931    DATE OF OPERATION: 07/17/2016  PREOP DIAGNOSIS: Stage V chronic kidney disease  POSTOP DIAGNOSIS: Same  PROCEDURE:  1.  Removal of temporary right IJ dialysis catheter 2. Ultrasound guided placement of right IJ 23 cm dialysis catheter 3. Right upper arm radial cephalic AV fistula  SURGEON: Judeth Cornfield. Scot Dock, MD, FACS  ASSIST: Gerri Lins PA  ANESTHESIA: Local with sedation   EBL: Minimal  INDICATIONS: Debbie [name] is a 83 y.o. female who presents for new access.  FINDINGS: Patent right IJ. High bifurcation of the brachial artery and the right. The anastomosis at the antecubital level was to the radial artery.  TECHNIQUE:   The patient was taken to the operating room and sedated by anesthesia. The neck and upper chest were prepped and draped in usual sterile fashion. The temporary catheter was fairly high up in the neck and I thought that the tunneled catheter would likely kink this high in the neck. For this reason I removed the temporary catheter after the sutures were cut and pressure was held for hemostasis. Next, under ultrasound guidance, after the skin was anesthetized, the right IJ was cannulated and a guidewire introduced into the right atrium under fluoroscopic control. The tract over the wire was dilated and then the dilator and peel-away sheath were advanced over the wire and the wire and dilator removed. The catheter was passed through the peel-away sheath in position and the right atrium. The exit site for the cath was selected and the skin anesthetized between the 2 areas. The cath was brought to the tunnel, to the appropriate length and the distal ports were attached. Both ports withdrew easily and flushed with saline and filled with concentrated heparin. The catheter was secured at its exit site 3-0 nylon sutures. The IJ cannulation site was closed with a 4-0 subcuticular stitch. Sterile dressing  was applied.   Attention was then turned to the right arm. The right upper extremity was prepped and draped in usual sterile fashion. By ultrasound the cephalic vein looked reasonable in the upper arm. After the skin was anesthetized with functional lidocaine, a transverse incision was made just above the antecubital level, and the cephalic vein was dissected free. This was ligated distally and irrigated up with heparinized saline. The artery was dissected free beneath the fascia. It was fairly small. However upon interrogation with a Doppler and it was clear that the patient had a high bifurcation of the brachial artery and the artery I had was the radial artery. I thought was adequate size for the fistula. The patient was heparinized. The radial artery was clamped proximally and distally and a longitudinal arteriotomy was made. The vein was cut the appropriate length spatulated and sewn end-to-side to the radial artery using continuous 6-0 Prolene suture. At the completion was good thrill in the fistula. There was a radial and ulnar signal with the Doppler. The heparin was partially reversed with protamine. The wound was closed with the plan of 3-0 Vicryl and the skin closed with 4-0 Vicryl. A sterile dressing was applied. The patient tolerated the procedure well and was transferred to the recovery room in stable condition. All needle and sponge count were correct.    [name], MD, FACS [facility]  DATE OF DICTATION:   07/17/2016

## 2016-07-17 NOTE — Anesthesia Preprocedure Evaluation (Addendum)
Anesthesia Evaluation  Patient identified by MRN, date of birth, ID band Patient awake    Reviewed: Allergy & Precautions, H&P , NPO status , Patient's Chart, lab work & pertinent test results, reviewed documented beta blocker date and time   Airway Mallampati: III  TM Distance: >3 FB Neck ROM: Full    Dental no notable dental hx. (+) Dental Advisory Given   Pulmonary COPD,    Pulmonary exam normal breath sounds clear to auscultation       Cardiovascular hypertension, Pt. on medications and Pt. on home beta blockers + Peripheral Vascular Disease   Rhythm:Regular Rate:Normal     Neuro/Psych Anxiety negative neurological ROS  negative psych ROS   GI/Hepatic negative GI ROS, Neg liver ROS,   Endo/Other  diabetes, Insulin Dependent  Renal/GU Renal InsufficiencyRenal disease  negative genitourinary   Musculoskeletal  (+) Arthritis ,   Abdominal   Peds  Hematology negative hematology ROS (+)   Anesthesia Other Findings   Reproductive/Obstetrics negative OB ROS                            Anesthesia Physical Anesthesia Plan  ASA: III  Anesthesia Plan: MAC   Post-op Pain Management:    Induction: Intravenous  Airway Management Planned: Simple Face Mask  Additional Equipment:   Intra-op Plan:   Post-operative Plan:   Informed Consent: I have reviewed the patients History and Physical, chart, labs and discussed the procedure including the risks, benefits and alternatives for the proposed anesthesia with the patient or authorized representative who has indicated his/her understanding and acceptance.   Dental advisory given  Plan Discussed with: CRNA  Anesthesia Plan Comments:         Anesthesia Quick Evaluation

## 2016-07-17 NOTE — Progress Notes (Signed)
Critical value received:  Hgb 6.8  Date of notification:  07/17/16  Time of notification:  0550  Critical value read back: Yes  Nurse who received alert:  Laveda Norman RN  MD notified (2nd page):  Tylene Fantasia  Time of second page:  [phone]

## 2016-07-17 NOTE — Progress Notes (Signed)
Assessment/Plan: 83 year old female with a past medical history of COPD, oxygen dependent, diastolic CHF, diabetes, chronic kidney disease found to have AKI with hyperkalemia initially requiring CRRT due to hypotension. Volume status was not improving with Lasix therefore HD restarted 07/14/16   AKI on CKD 4, now ESRD.   Will commit to HD as OP - vascular surgery: L AVD and AVG placement 2/8 11am - HD today   Anemia esa/Fe.  - hgb 6.8 from 7.7 today - transfusion with dialysis  CAD  DM   Subjective: Interval History: Had HD 2/6. No concerns this morning   Objective: Vital signs in last 24 hours: Temp:  [98.3 F (36.8 C)-98.8 F (37.1 C)] 98.7 F (37.1 C) (02/08 0710) Pulse Rate:  [63-85] 66 (02/08 0730) Resp:  [16-18] 17 (02/08 0730) BP: (69-128)/(34-62) 100/34 (02/08 0730) SpO2:  [96 %-100 %] 97 % (02/08 0730) Weight:  [161 lb 6 oz (73.2 kg)] 161 lb 6 oz (73.2 kg) (02/07 2214) Weight change: -1 lb 12.2 oz (-0.8 kg)  Intake/Output from previous day: 02/07 0701 - 02/08 0700 In: 640 [P.O.:640] Out: 1100 [Urine:1100] Intake/Output this shift: No intake/output data recorded.  General appearance: alert and cooperative, in HD Heart: RRR, systolic murmur [id number] best hears at apex Lungs: diminished breath sounds at bases without crackles, normal effort Extremities: extremities normal, atraumatic, no cyanosis or edema, no sacral edema noted  Lab Results:  Recent Labs  07/15/16 0930 07/17/16 0412  WBC 6.1 5.4  HGB 7.7* 6.8*  HCT 25.7* 22.3*  PLT 288 219   BMET:   Recent Labs  07/16/16 0417 07/17/16 0412  NA 134* 135  K 3.5 3.3*  CL 93* 98*  CO2 31 27  GLUCOSE 102* 94  BUN 21* 34*  CREATININE 1.88* 2.72*  CALCIUM 8.0* 7.9*   No results for input(s): PTH in the last 72 hours. Iron Studies: No results for input(s): IRON, TIBC, TRANSFERRIN, FERRITIN in the last 72 hours. Studies/Results: No results found.  Scheduled: . arformoterol  15 mcg Nebulization BID  .  aspirin EC  81 mg Oral Daily  . atorvastatin  20 mg Oral q1800  . budesonide (PULMICORT) nebulizer solution  0.5 mg Nebulization BID  .  ceFAZolin (ANCEF) IV  2 g Intravenous To SS-Surg  . Darbepoetin Alfa      . darbepoetin (ARANESP) injection - DIALYSIS  150 mcg Intravenous Q Thu-HD  . feeding supplement (NEPRO CARB STEADY)  237 mL Oral TID BM  . guaiFENesin  600 mg Oral BID  . heparin  5,000 Units Subcutaneous Q8H  . insulin aspart  0-9 Units Subcutaneous TID WC  . multivitamin with minerals  1 tablet Oral Daily  . sodium chloride  500 mL Intravenous Once  . sodium chloride flush  10-40 mL Intracatheter Q12H    LOS: 22 days   Smiley Houseman PGY 2 Family Medicine  Renal Attending: I agre with note as written above. Madalina Rosman C

## 2016-07-17 NOTE — Progress Notes (Signed)
[facility] PROGRESS NOTE  Debbie [name] [id number] DOB: [birth year] DOA: 06/25/2016  PCP: Myriam Jacobson, MD  Brief History/Interval Summary: 83 year old female with a past medical history of COPD, oxygen dependent, diastolic CHF, diabetes, chronic kidney disease, presented with shortness of breath and was found to have low oxygen levels and low blood pressure. She was found to have acute on chronic kidney disease. Initially admitted to step down then had to be transferred to ICU due to hypotension. Started on hemodialysis. She is being on the dialysis, improvement, diuresis worsening cycle twice, currently planning for tunneled HD catheter AVF and outpatient dialysis.  Reason for Visit: Acute renal failure  Consultants: Nephrology. Vascular surgery.  Procedures:  Hemodialysis  Placement of vascular access in the right arm 2/8   Antibiotics: None  Subjective/Interval History: Patient seen at hemodialysis. Denies any complaints. No chest pain or shortness of breath.   ROS: No nausea or vomiting, or diarrhea  Objective:  Vital Signs  Vitals:   07/17/16 0529 07/17/16 0710 07/17/16 0725 07/17/16 0730  BP: (!) 90/49 (!) 74/62 (!) 88/35 (!) 100/34  Pulse: 69 63 64 66  Resp:  18 16 17   Temp: 98.4 F (36.9 C) 98.7 F (37.1 C)    TempSrc: Oral Oral    SpO2: 100% 98% 96% 97%  Weight:      Height:        Intake/Output Summary (Last 24 hours) at 07/17/16 0850 Last data filed at 07/17/16 0531  Gross per 24 hour  Intake              640 ml  Output              875 ml  Net             -235 ml   Filed Weights   07/15/16 1317 07/15/16 2044 07/16/16 2214  Weight: 72.2 kg (159 lb 2.8 oz) 72.3 kg (159 lb 8 oz) 73.2 kg (161 lb 6 oz)    General appearance: alert, cooperative, appears stated age and no distress Resp: Good air entry bilaterally. Few crackles at the bases. No wheezing. Cardio: regular rate and rhythm, S1, S2 normal, no murmur, click, rub or  gallop GI: soft, non-tender; bowel sounds normal; no masses,  no organomegaly Extremities: extremities normal, atraumatic, no cyanosis or edema Neurologic: No focal deficits  Lab Results:  Data Reviewed: I have personally reviewed following labs and imaging studies  CBC:  Recent Labs Lab 07/11/16 0447 07/14/16 0241 07/14/16 1806 07/15/16 0930 07/17/16 0412 07/17/16 0802  WBC 6.1 5.5 6.9 6.1 5.4  --   HGB 7.5* 7.1* 8.0* 7.7* 6.8* 7.3*  HCT 25.1* 23.6* 26.4* 25.7* 22.3* 23.7*  MCV 99.2 100.4* 99.2 100.4* 99.6  --   PLT 269 274 275 288 219  --     Basic Metabolic Panel:  Recent Labs Lab 07/14/16 0241 07/14/16 1558 07/15/16 0516 07/16/16 0417 07/17/16 0412  NA 135 136 136 134* 135  K 3.6 3.8 4.3 3.5 3.3*  CL 91* 91* 96* 93* 98*  CO2 34* 33* 31 31 27   GLUCOSE 115* 134* 120* 102* 94  BUN 80* 69* 35* 21* 34*  CREATININE 4.72* 4.20* 2.62* 1.88* 2.72*  CALCIUM 8.9 8.5* 8.2* 8.0* 7.9*  PHOS 1.8* 1.9* 2.0* 1.8* 2.9    GFR: Estimated Creatinine Clearance: 15.4 mL/min (by C-G formula based on SCr of 2.72 mg/dL (H)).  Liver Function Tests:  Recent Labs Lab 07/14/16 0241 07/14/16 1558 07/15/16 0516  07/16/16 0417 07/17/16 0412  ALBUMIN 2.4* 2.4* 2.4* 2.4* 2.2*    CBG:  Recent Labs Lab 07/15/16 2200 07/16/16 0756 07/16/16 1139 07/16/16 1621 07/16/16 2038  GLUCAP 124* 109* 145* 136* 111*     Recent Results (from the past 240 hour(s))  MRSA PCR Screening     Status: Abnormal   Collection Time: 07/12/16  6:15 AM  Result Value Ref Range Status   MRSA by PCR POSITIVE (A) NEGATIVE Final    Comment:        The GeneXpert MRSA Assay (FDA approved for NASAL specimens only), is one component of a comprehensive MRSA colonization surveillance program. It is not intended to diagnose MRSA infection nor to guide or monitor treatment for MRSA infections. RESULT CALLED TO, READ BACK BY AND VERIFIED WITH: A. BRAKE 0759 02.03.2018 N. MORRIS       Radiology  Studies: No results found.   Medications:  Scheduled: . arformoterol  15 mcg Nebulization BID  . aspirin EC  81 mg Oral Daily  . atorvastatin  20 mg Oral q1800  . budesonide (PULMICORT) nebulizer solution  0.5 mg Nebulization BID  .  ceFAZolin (ANCEF) IV  2 g Intravenous To SS-Surg  . Darbepoetin Alfa      . darbepoetin (ARANESP) injection - DIALYSIS  150 mcg Intravenous Q Thu-HD  . feeding supplement (NEPRO CARB STEADY)  237 mL Oral TID BM  . guaiFENesin  600 mg Oral BID  . heparin  5,000 Units Subcutaneous Q8H  . insulin aspart  0-9 Units Subcutaneous TID WC  . multivitamin with minerals  1 tablet Oral Daily  . sodium chloride  500 mL Intravenous Once  . sodium chloride flush  10-40 mL Intracatheter Q12H   Continuous:  [id number] chloride, sodium chloride, acetaminophen **OR** acetaminophen, albuterol, diphenhydrAMINE, lidocaine (PF), ondansetron **OR** ondansetron (ZOFRAN) IV, sodium chloride, sodium chloride flush  Assessment/Plan:  Active Problems:   Renal failure   AKI (acute kidney injury) ([place])   Hyperkalemia   Atrial fibrillation ([facility])   Bradycardia   Central venous catheter in place    Acute kidney injury on chronic kidney disease, stage 3 -Baseline creatinine is 2.1 from 1/5, presented with creatinine of 6.9. -Initially admitted to the ICU because of hypotension started on CRRT. -Nephrology following. She was placed on high dose Lasix. She was getting intermittent hemodialysis. No longer on Lasix. -Now deemed to have end-stage renal disease. Will need long-term dialysis. Vascular surgery consulted. AV fistula to be created and a more permanent catheter to be placed 2/8. -Okay to discontinue Foley catheter.  Acute on chronic hypoxic resp failure -From reviewing the notes patient started on oxygen when she was discharged from the hospital on Christmas of 2017 -Currently sats 100% on 2 L try to wean off of oxygen  Biateral pleural effusion -Likely due to  fluid overload. Patient has had pleural effusions since December.  -Bilateral effusion, likely thoracentesis is not going to help, this will improve with dialysis.  Chronic Diastolic heart failure -This is based off of an echocardiogram from December. Previously on torsemide.  Volume being managed with hemodialysis.  Episodic bradycardia -Thought to be secondary to Hyperkalemia versus beta blocker. Resolved.  Recent H flu +/-HCAP -Stable  History of COPD  -Stable. Apparently on home oxygen. Continue home medications. No wheezing noted. Continue current medications.  Anemia of chronic disease -Hemoglobin noted to have dropped some today. No overt bleeding has been noted. Patient will be transfused 2 units with hemodialysis. Low iron and  ferritin levels noted. Parenteral Iron was given.   History of Endometrial adenocarcinoma status post XRT -Stable, just finished XRT with Dr. Sondra Come on 05/22/16.  DVT Prophylaxis: Subcutaneous heparin    Code Status: Full code  Family Communication: Discussed with patient and son Disposition Plan: AV fistula and catheter placement today. She will need to be established with a dialysis clinic. Then could be discharged. Hopefully in the next few days.    LOS: 22 days   [facility] Pager [phone] 07/17/2016, 8:50 AM  If 7PM-7AM, please contact night-coverage at [email or web address], password [facility]

## 2016-07-17 NOTE — Procedures (Signed)
Tolerating hemodialysis. Hgb down to 7.3gm.  Stable hemodynamics.  Will give blood preop. Braun Rocca C

## 2016-07-17 NOTE — Anesthesia Procedure Notes (Signed)
Procedure Name: MAC Date/Time: 07/17/2016 1:05 PM Performed by: Mervyn Gay Pre-anesthesia Checklist: Patient identified, Patient being monitored, Timeout performed, Emergency Drugs available and Suction available Patient Re-evaluated:Patient Re-evaluated prior to inductionOxygen Delivery Method: Nasal cannula Preoxygenation: Pre-oxygenation with 100% oxygen Number of attempts: 1 Placement Confirmation: positive ETCO2 Dental Injury: Teeth and Oropharynx as per pre-operative assessment

## 2016-07-18 ENCOUNTER — Encounter (HOSPITAL_COMMUNITY): Payer: Self-pay | Admitting: Vascular Surgery

## 2016-07-18 DIAGNOSIS — N186 End stage renal disease: Secondary | ICD-10-CM

## 2016-07-18 DIAGNOSIS — E118 Type 2 diabetes mellitus with unspecified complications: Secondary | ICD-10-CM

## 2016-07-18 LAB — GLUCOSE, CAPILLARY
Glucose-Capillary: 127 mg/dL — ABNORMAL HIGH (ref 65–99)
Glucose-Capillary: 203 mg/dL — ABNORMAL HIGH (ref 65–99)
Glucose-Capillary: 129 mg/dL — ABNORMAL HIGH (ref 65–99)

## 2016-07-18 LAB — TYPE AND SCREEN
Blood Product Expiration Date: 201803052359
Blood Product Expiration Date: 201803062359
ISSUE DATE / TIME: 201802081022
ISSUE DATE / TIME: 201802081022
UNIT TYPE AND RH: 5100
Unit Type and Rh: 5100

## 2016-07-18 LAB — RENAL FUNCTION PANEL
ALBUMIN: 2.6 g/dL — AB (ref 3.5–5.0)
ANION GAP: 12 (ref 5–15)
BUN: 29 mg/dL — ABNORMAL HIGH (ref 6–20)
CALCIUM: 9.1 mg/dL (ref 8.9–10.3)
CO2: 27 mmol/L (ref 22–32)
Chloride: 95 mmol/L — ABNORMAL LOW (ref 101–111)
Creatinine, Ser: 2.28 mg/dL — ABNORMAL HIGH (ref 0.44–1.00)
GFR calc Af Amer: 22 mL/min — ABNORMAL LOW (ref >60)
GFR, EST NON AFRICAN AMERICAN: 19 mL/min — AB (ref >60)
Glucose, Bld: 133 mg/dL — ABNORMAL HIGH (ref 65–99)
PHOSPHORUS: 2.8 mg/dL (ref 2.5–4.6)
POTASSIUM: 4.6 mmol/L (ref 3.5–5.1)
Sodium: 134 mmol/L — ABNORMAL LOW (ref 135–145)

## 2016-07-18 LAB — CBC
HEMATOCRIT: 33.6 % — AB (ref 36.0–46.0)
HEMOGLOBIN: 10.6 g/dL — AB (ref 12.0–15.0)
MCH: 29.9 pg (ref 26.0–34.0)
MCHC: 31.5 g/dL (ref 30.0–36.0)
MCV: 94.9 fL (ref 78.0–100.0)
Platelets: 214 10*3/uL (ref 150–400)
RBC: 3.54 MIL/uL — ABNORMAL LOW (ref 3.87–5.11)
RDW: 19.7 % — AB (ref 11.5–15.5)
WBC: 9 10*3/uL (ref 4.0–10.5)

## 2016-07-18 MED ORDER — PENTAFLUOROPROP-TETRAFLUOROETH EX AERO: 1.0000 "application " | INHALATION_SPRAY | CUTANEOUS | Status: DC | PRN

## 2016-07-18 MED ORDER — SODIUM CHLORIDE 0.9 % IV SOLN: 100.0000 mL | INTRAVENOUS | Status: DC | PRN

## 2016-07-18 MED ORDER — ALTEPLASE 2 MG IJ SOLR: 2.0000 mg | Freq: Once | INTRAMUSCULAR | Status: DC | PRN

## 2016-07-18 MED ORDER — LIDOCAINE HCL (PF) 1 % IJ SOLN: 5.0000 mL | INTRAMUSCULAR | Status: DC | PRN

## 2016-07-18 MED ORDER — LIDOCAINE-PRILOCAINE 2.5-2.5 % EX CREA: 1.0000 "application " | TOPICAL_CREAM | CUTANEOUS | Status: DC | PRN

## 2016-07-18 MED ORDER — HEPARIN SODIUM (PORCINE) 1000 UNIT/ML DIALYSIS: 20.0000 [IU]/kg | INTRAMUSCULAR | Status: DC | PRN

## 2016-07-18 MED ORDER — HEPARIN SODIUM (PORCINE) 1000 UNIT/ML DIALYSIS: 1000.0000 [IU] | INTRAMUSCULAR | Status: DC | PRN

## 2016-07-18 MED ORDER — SODIUM CHLORIDE 0.9 % IV BOLUS (SEPSIS)
250.0000 mL | Freq: Once | INTRAVENOUS | Status: AC
  Administered 2016-07-18: 250 mL via INTRAVENOUS

## 2016-07-18 NOTE — Progress Notes (Signed)
[facility] PROGRESS NOTE  Debbie [name] [id number] DOB: August 27, 1930 DOA: 06/25/2016  PCP: Myriam Jacobson, MD  Brief History/Interval Summary: 83 year old female with a past medical history of COPD, oxygen dependent, diastolic CHF, diabetes, chronic kidney disease, presented with shortness of breath and was found to have low oxygen levels and low blood pressure. She was found to have acute on chronic kidney disease. Initially admitted to step down then had to be transferred to ICU due to hypotension. Started on hemodialysis. She is being on the dialysis, improvement, diuresis worsening cycle twice, currently planning for tunneled HD catheter AVF and outpatient dialysis.  Reason for Visit: Acute renal failure  Consultants: Nephrology. Vascular surgery.  Procedures:  Hemodialysis  Placement of fistula in the right arm 2/8  Tunneled HD catheter 2/8   Antibiotics: None  Subjective/Interval History: Patient feels well. Denies any pain in her right arm. No chest pain or shortness of breath.   ROS: No nausea or vomiting, or diarrhea  Objective:  Vital Signs  Vitals:   07/18/16 0547 07/18/16 0936 07/18/16 0938 07/18/16 1014  BP: 108/65   (!) 101/49  Pulse: (!) 59   68  Resp: 20   18  Temp: 98.5 F (36.9 C)   97.6 F (36.4 C)  TempSrc: Oral   Oral  SpO2: 98% 98% 98% 100%  Weight:      Height:        Intake/Output Summary (Last 24 hours) at 07/18/16 1307 Last data filed at 07/18/16 1046  Gross per 24 hour  Intake              950 ml  Output              235 ml  Net              715 ml   Filed Weights   07/17/16 0710 07/17/16 1125 07/17/16 2017  Weight: 71.3 kg (157 lb 3 oz) 70.2 kg (154 lb 12.2 oz) 69.7 kg (153 lb 10.6 oz)    General appearance: alert, cooperative, appears stated age and no distress Resp: Good air entry bilaterally. Few crackles at the bases. No wheezing. Cardio: regular rate and rhythm, S1, S2 normal, no murmur, click, rub or  gallop GI: soft, non-tender; bowel sounds normal; no masses,  no organomegaly Extremities: There is some bruising noted around the surgical site in the right arm. She does have good bruit. No active bleeding is noted. Neurologic: No focal deficits  Lab Results:  Data Reviewed: I have personally reviewed following labs and imaging studies  CBC:  Recent Labs Lab 07/14/16 1806 07/15/16 0930 07/17/16 0412 07/17/16 0802 07/17/16 2150 07/18/16 0531  WBC 6.9 6.1 5.4  --  8.8 9.0  HGB 8.0* 7.7* 6.8* 7.3* 10.9* 10.6*  HCT 26.4* 25.7* 22.3* 23.7* 34.2* 33.6*  MCV 99.2 100.4* 99.6  --  95.3 94.9  PLT 275 288 219  --  208 [id number]    Basic Metabolic Panel:  Recent Labs Lab 07/15/16 0516 07/16/16 0417 07/17/16 0412 07/17/16 2150 07/18/16 0531  NA 136 134* 135 134* 134*  K 4.3 3.5 3.3* 4.2 4.6  CL 96* 93* 98* 96* 95*  CO2 31 31 27 28 27   GLUCOSE 120* 102* 94 322* 133*  BUN 35* 21* 34* 21* 29*  CREATININE 2.62* 1.88* 2.72* 1.82* 2.28*  CALCIUM 8.2* 8.0* 7.9* 8.5* 9.1  PHOS 2.0* 1.8* 2.9 2.5 2.8    GFR: Estimated Creatinine Clearance: 18 mL/min (by C-G  formula based on SCr of 2.28 mg/dL (H)).  Liver Function Tests:  Recent Labs Lab 07/15/16 0516 07/16/16 0417 07/17/16 0412 07/17/16 2150 07/18/16 0531  ALBUMIN 2.4* 2.4* 2.2* 2.6* 2.6*    CBG:  Recent Labs Lab 07/17/16 1452 07/17/16 1659 07/17/16 2007 07/18/16 0750 07/18/16 1121  GLUCAP 129* 158* 304* 127* 203*     Recent Results (from the past 240 hour(s))  MRSA PCR Screening     Status: Abnormal   Collection Time: 07/12/16  6:15 AM  Result Value Ref Range Status   MRSA by PCR POSITIVE (A) NEGATIVE Final    Comment:        The GeneXpert MRSA Assay (FDA approved for NASAL specimens only), is one component of a comprehensive MRSA colonization surveillance program. It is not intended to diagnose MRSA infection nor to guide or monitor treatment for MRSA infections. RESULT CALLED TO, READ BACK BY AND  VERIFIED WITH: A. BRAKE [id number] 02.03.2018 Myrtie Cruise       Radiology Studies: Dg Chest Port 1 View  Result Date: 07/17/2016 CLINICAL DATA:  Central line placement. EXAM: PORTABLE CHEST 1 VIEW COMPARISON:  07/12/2016 FINDINGS: A right jugular dialysis catheter terminates over the lower SVC. The cardiac silhouette is mildly enlarged. There is persistent pulmonary vascular congestion and mild interstitial prominence, similar to the prior study. Veiling opacities in both lung bases also do not appear significantly changed and are compatible with layering pleural effusions. No pneumothorax is identified. Surgical clips are noted in the lower neck. No acute osseous abnormality is seen. IMPRESSION: 1. Right jugular catheter terminates over the lower SVC. 2. Cardiomegaly with similar appearance of mild interstitial edema and small bilateral pleural effusions. Electronically Signed   By: Logan Bores M.D.   On: 07/17/2016 16:10   Dg Fluoro Guide Cv Line-no Report  Result Date: 07/17/2016 Fluoroscopy was utilized by the requesting physician.  No radiographic interpretation.     Medications:  Scheduled: . sodium chloride   Intravenous Once  . arformoterol  15 mcg Nebulization BID  . aspirin EC  81 mg Oral Daily  . atorvastatin  20 mg Oral q1800  . budesonide (PULMICORT) nebulizer solution  0.5 mg Nebulization BID  . darbepoetin (ARANESP) injection - DIALYSIS  150 mcg Intravenous Q Thu-HD  . feeding supplement (NEPRO CARB STEADY)  237 mL Oral TID BM  . guaiFENesin  600 mg Oral BID  . heparin  5,000 Units Subcutaneous Q8H  . insulin aspart  0-9 Units Subcutaneous TID WC  . multivitamin with minerals  1 tablet Oral Daily  . sodium chloride  500 mL Intravenous Once  . sodium chloride flush  10-40 mL Intracatheter Q12H   Continuous: . sodium chloride 10 mL/hr at 07/17/16 1239   [id number] chloride, sodium chloride, sodium chloride, sodium chloride, acetaminophen **OR** acetaminophen, albuterol,  alteplase, diphenhydrAMINE, heparin, heparin, lidocaine (PF), lidocaine (PF), lidocaine-prilocaine, ondansetron **OR** ondansetron (ZOFRAN) IV, pentafluoroprop-tetrafluoroeth, sodium chloride, sodium chloride flush  Assessment/Plan:  Active Problems:   Renal failure   AKI (acute kidney injury) ([place])   Hyperkalemia   Atrial fibrillation (HCC)   Bradycardia   Central venous catheter in place    Acute kidney injury on chronic kidney disease, stage 3 -Baseline creatinine is 2.1 from 1/5, presented with creatinine of 6.9. -Initially admitted to the ICU because of hypotension started on CRRT. -Nephrology following. She was placed on high dose Lasix. She was getting intermittent hemodialysis. No longer on Lasix. -Now deemed to have end-stage renal disease. Will need long-term  dialysis. Vascular surgery consulted. AV fistula and tunneled HD catheter placed 2/8. -Okay to discontinue Foley catheter.  Acute on chronic hypoxic resp failure -From reviewing the notes patient started on oxygen when she was discharged from the hospital on Christmas of 2017 -Currently sats 100% on 2 L try to wean off of oxygen. Home health to monitor oxygen levels at home. She may be able to come off of oxygen, eventually.  Biateral pleural effusion -Likely due to fluid overload. Patient has had pleural effusions since December.  -Bilateral effusion, likely thoracentesis is not going to help, this will improve with dialysis.  Chronic Diastolic heart failure -This is based off of an echocardiogram from December. Previously on torsemide.  Volume being managed with hemodialysis.  Episodic bradycardia -Thought to be secondary to Hyperkalemia versus beta blocker. Resolved.  Recent H flu +/-HCAP -Stable  History of COPD  -Stable. Apparently on home oxygen. Continue home medications. No wheezing noted. Continue current medications.  Anemia of chronic disease -Hemoglobin noted to have dropped some today. No  overt bleeding has been noted. Patient will be transfused 2 units with hemodialysis. Low iron and ferritin levels noted. Parenteral Iron was given.   Diabetes mellitus type II Patient was on metformin and sliding scale coverage at home. Metformin will need to be discontinued. Only on sliding scale here. Last HbA1c was 5.4 in December. We will recommend that she be only on sliding scale coverage for now. PCP to address at follow-up.  History of Endometrial adenocarcinoma status post XRT -Stable, just finished XRT with Dr. Sondra Come on 05/22/16.  DVT Prophylaxis: Subcutaneous heparin    Code Status: Full code  Family Communication: Discussed with patient and son Disposition Plan: She has been established with the dialysis clinic, starting Monday 2/12. Plan is for dialysis later today or tomorrow. And then she can be discharged home on 2/10.    LOS: 23 days   [facility] Pager [phone] 07/18/2016, 1:07 PM  If 7PM-7AM, please contact night-coverage at [email or web address], password [facility]

## 2016-07-18 NOTE — Progress Notes (Signed)
   VASCULAR SURGERY ASSESSMENT & PLAN:  1 Day Post-Op s/p: Diatek Catheter and right upper arm AVF.  Fistula with good thrill. I will arrange a f/u visit in 6 weeks.   Vascular will be available as needed.  SUBJECTIVE: No complaints  PHYSICAL EXAM: Vitals:   07/18/16 0547 07/18/16 0936 07/18/16 0938 07/18/16 1014  BP: 108/65   (!) 101/49  Pulse: (!) 59   68  Resp: 20   18  Temp: 98.5 F (36.9 C)   97.6 F (36.4 C)  TempSrc: Oral   Oral  SpO2: 98% 98% 98% 100%  Weight:      Height:       Good thrill in right upper arm AVF Incision looks fine.   LABS: Lab Results  Component Value Date   WBC 8.8 07/17/2016   HGB 10.9 (L) 07/17/2016   HCT 34.2 (L) 07/17/2016   MCV 95.3 07/17/2016   PLT 208 07/17/2016   Lab Results  Component Value Date   CREATININE 2.28 (H) 07/18/2016   Lab Results  Component Value Date   INR 1.03 07/17/2016   CBG (last 3)   Recent Labs  07/17/16 1659 07/17/16 2007 07/18/16 0750  GLUCAP 158* 304* 127*    Active Problems:   Renal failure   AKI (acute kidney injury) ([employer])   Hyperkalemia   Atrial fibrillation (HCC)   Bradycardia   Central venous catheter in place   Gae Gallop Beeper: [id number] 07/18/2016

## 2016-07-18 NOTE — Progress Notes (Signed)
Physical Therapy Treatment Patient Details Name: Debbie [name] MRN: [id number] DOB: [birth year] Today's Date: 07/18/2016    History of Present Illness Pt is an 83 y/o female admitted from a SNF secondary to dyspnea and hyperkalemia. 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    PT Comments    Stamina improving.  Still needs encouragement.   Follow Up Recommendations  Home health PT;Supervision/Assistance - 24 hour     Equipment Recommendations  3in1 (PT)    Recommendations for Other Services       Precautions / Restrictions Precautions Precautions: Fall Precaution Comments: supplemental O2 Restrictions Weight Bearing Restrictions: No    Mobility  Bed Mobility Overal bed mobility: Needs Assistance Bed Mobility: Supine to Sit     Supine to sit: Min guard     General bed mobility comments: With rail no struggle getting up.  Transfers Overall transfer level: Needs assistance Equipment used: Rolling walker (2 wheeled) Transfers: Sit to/from Stand Sit to Stand: Min assist         General transfer comment: Stability assist and cues for hand placement  Ambulation/Gait Ambulation/Gait assistance: Min guard Ambulation Distance (Feet): 170 Feet Assistive device: Rolling walker (2 wheeled) Gait Pattern/deviations: Step-through pattern Gait velocity: slower Gait velocity interpretation: Below normal speed for age/gender General Gait Details: generally steady, but guarded and more tired   Stairs            Wheelchair Mobility    Modified Rankin (Stroke Patients Only)       Balance     Sitting balance-Leahy Scale: Fair     Standing balance support: Bilateral upper extremity supported Standing balance-Leahy Scale: Poor Standing balance comment: reliant on the RW                    Cognition  Arousal/Alertness: Awake/alert Behavior During Therapy: WFL for tasks assessed/performed Overall Cognitive Status: Within Functional Limits for tasks assessed                      Exercises General Exercises - Lower Extremity Ankle Circles/Pumps: AROM;Both;20 reps;Supine Heel Slides: AROM;Strengthening;Both;10 reps;Supine Straight Leg Raises: AROM;Strengthening;Both;10 reps;Seated    General Comments        Pertinent Vitals/Pain Faces Pain Scale: No hurt    Home Living                      Prior Function            PT Goals (current goals can now be found in the care plan section) Acute Rehab PT Goals Patient Stated Goal: home PT Goal Formulation: With patient/family Time For Goal Achievement: 07/21/16 Potential to Achieve Goals: Fair Progress towards PT goals: Progressing toward goals    Frequency    Min 3X/week      PT Plan Current plan remains appropriate    Co-evaluation             End of Session Equipment Utilized During Treatment: Oxygen Activity Tolerance: Patient tolerated treatment well Patient left: with call bell/phone within reach;in chair;with chair alarm set     Time: [id number] PT Time Calculation (min) (ACUTE ONLY): 26 min  Charges:  $Gait Training: 8-22 mins $Therapeutic Exercise: 8-22 mins                    G Codes:  Tessie Fass Maleyah Evans 07/18/2016, 9:45 AM 07/18/2016  Donnella Sham, [place] [phone]  (pager)

## 2016-07-18 NOTE — Progress Notes (Signed)
Accepted at [place] Start Date and Time :Monday ,2/12 arrive at 11:30am for paperwork .Chairtime at 12:45 pm schedule Mon ,Wed and Fri 2nd shift pending on Nephrology approval

## 2016-07-18 NOTE — Progress Notes (Signed)
Assessment/Plan: 83 year old female with a past medical history of COPD, oxygen dependent, diastolic CHF, diabetes, chronic kidney disease found to have AKI with hyperkalemia initially requiring CRRT due to hypotension. Volume status was not improving with Lasix therefore HD restarted 07/14/16. Now ESRD and commit to HD. S/p R IJ dialysis catheter and R radial AVF placement 2/8.    AKI on CKD 4, now ESRD. S/p R IJ dialysis catheter and R radial AVF placement 2/8.  - has outpatient HD set up.    Anemia of chronic diasease esa/Fe.  - s/p 2 units pRBC 2/8. Post-transfusion hgb stable    CAD  DM   Subjective: Interval History: S/p R IJ dialysis catheter and R radial AVF placement 2/8. No overnight events. No concerns this morning. Appetite is better  Objective: Vital signs in last 24 hours: Temp:  [97.5 F (36.4 C)-98.5 F (36.9 C)] 98.5 F (36.9 C) (02/09 0547) Pulse Rate:  [37-125] 59 (02/09 0547) Resp:  [14-20] 20 (02/09 0547) BP: (88-151)/(32-102) 108/65 (02/09 0547) SpO2:  [92 %-100 %] 98 % (02/09 0547) Weight:  [69.7 kg (153 lb 10.6 oz)-70.2 kg (154 lb 12.2 oz)] 69.7 kg (153 lb 10.6 oz) (02/08 2017) Weight change: -1.9 kg (-4 lb 3 oz)  Intake/Output from previous day: 02/08 0701 - 02/09 0700 In: 1380 [P.O.:360; I.V.:350; Blood:670] Out: 1445 [Urine:325; Blood:10] Intake/Output this shift: No intake/output data recorded.  General appearance: alert and cooperative, in HD Heart: RRR, systolic murmur [id number] best hears at apex Lungs: CTAB,  normal effort Extremities: extremities normal, atraumatic, no cyanosis or edema, no sacral edema noted Right IJ dialysis catheter, R radial AVF without palpable thrill  Lab Results:  Recent Labs  07/17/16 0412 07/17/16 0802 07/17/16 2150  WBC 5.4  --  8.8  HGB 6.8* 7.3* 10.9*  HCT 22.3* 23.7* 34.2*  PLT 219  --  208   BMET:   Recent Labs  07/17/16 2150 07/18/16 0531  NA 134* 134*  K 4.2 4.6  CL 96* 95*  CO2 28 27  GLUCOSE  322* 133*  BUN 21* 29*  CREATININE 1.82* 2.28*  CALCIUM 8.5* 9.1   No results for input(s): PTH in the last 72 hours. Iron Studies: No results for input(s): IRON, TIBC, TRANSFERRIN, FERRITIN in the last 72 hours. Studies/Results: Dg Chest Port 1 View  Result Date: 07/17/2016 CLINICAL DATA:  Central line placement. EXAM: PORTABLE CHEST 1 VIEW COMPARISON:  07/12/2016 FINDINGS: A right jugular dialysis catheter terminates over the lower SVC. The cardiac silhouette is mildly enlarged. There is persistent pulmonary vascular congestion and mild interstitial prominence, similar to the prior study. Veiling opacities in both lung bases also do not appear significantly changed and are compatible with layering pleural effusions. No pneumothorax is identified. Surgical clips are noted in the lower neck. No acute osseous abnormality is seen. IMPRESSION: 1. Right jugular catheter terminates over the lower SVC. 2. Cardiomegaly with similar appearance of mild interstitial edema and small bilateral pleural effusions. Electronically Signed   By: Logan Bores M.D.   On: 07/17/2016 16:10   Dg Fluoro Guide Cv Line-no Report  Result Date: 07/17/2016 Fluoroscopy was utilized by the requesting physician.  No radiographic interpretation.    Scheduled: . sodium chloride   Intravenous Once  . arformoterol  15 mcg Nebulization BID  . aspirin EC  81 mg Oral Daily  . atorvastatin  20 mg Oral q1800  . budesonide (PULMICORT) nebulizer solution  0.5 mg Nebulization BID  . darbepoetin (ARANESP) injection -  DIALYSIS  150 mcg Intravenous Q Thu-HD  . feeding supplement (NEPRO CARB STEADY)  237 mL Oral TID BM  . guaiFENesin  600 mg Oral BID  . heparin  5,000 Units Subcutaneous Q8H  . insulin aspart  0-9 Units Subcutaneous TID WC  . multivitamin with minerals  1 tablet Oral Daily  . sodium chloride  500 mL Intravenous Once  . sodium chloride flush  10-40 mL Intracatheter Q12H    LOS: 23 days   Smiley Houseman PGY 2  Family Medicine  Renal Attending: As above. Will plan a short HD treatmnt today to keep on schedule for OP HD. Osmel Dykstra C

## 2016-07-19 DIAGNOSIS — Z992 Dependence on renal dialysis: Secondary | ICD-10-CM

## 2016-07-19 LAB — CBC
HEMATOCRIT: 34.9 % — AB (ref 36.0–46.0)
HEMOGLOBIN: 10.7 g/dL — AB (ref 12.0–15.0)
MCH: 30.2 pg (ref 26.0–34.0)
MCHC: 30.7 g/dL (ref 30.0–36.0)
MCV: 98.6 fL (ref 78.0–100.0)
Platelets: 222 10*3/uL (ref 150–400)
RBC: 3.54 MIL/uL — ABNORMAL LOW (ref 3.87–5.11)
RDW: 20.2 % — AB (ref 11.5–15.5)
WBC: 10 10*3/uL (ref 4.0–10.5)

## 2016-07-19 LAB — RENAL FUNCTION PANEL
ALBUMIN: 2.5 g/dL — AB (ref 3.5–5.0)
ANION GAP: 11 (ref 5–15)
BUN: 25 mg/dL — AB (ref 6–20)
CHLORIDE: 101 mmol/L (ref 101–111)
CO2: 27 mmol/L (ref 22–32)
Calcium: 8.9 mg/dL (ref 8.9–10.3)
Creatinine, Ser: 2.3 mg/dL — ABNORMAL HIGH (ref 0.44–1.00)
GFR calc Af Amer: 21 mL/min — ABNORMAL LOW (ref >60)
GFR, EST NON AFRICAN AMERICAN: 18 mL/min — AB (ref >60)
Glucose, Bld: 157 mg/dL — ABNORMAL HIGH (ref 65–99)
PHOSPHORUS: 2 mg/dL — AB (ref 2.5–4.6)
POTASSIUM: 3.8 mmol/L (ref 3.5–5.1)
Sodium: 139 mmol/L (ref 135–145)

## 2016-07-19 LAB — GLUCOSE, CAPILLARY
Glucose-Capillary: 130 mg/dL — ABNORMAL HIGH (ref 65–99)
Glucose-Capillary: 189 mg/dL — ABNORMAL HIGH (ref 65–99)

## 2016-07-19 MED ORDER — NEPRO/CARBSTEADY PO LIQD: 237.0000 mL | Freq: Three times a day (TID) | ORAL | 0 refills | Status: DC

## 2016-07-19 MED ORDER — SODIUM CHLORIDE 0.9 % IV BOLUS (SEPSIS)
500.0000 mL | Freq: Once | INTRAVENOUS | Status: AC
  Administered 2016-07-19: 500 mL via INTRAVENOUS

## 2016-07-19 MED ORDER — GUAIFENESIN ER 600 MG PO TB12: 600.0000 mg | ORAL_TABLET | Freq: Two times a day (BID) | ORAL | 0 refills | Status: AC | PRN

## 2016-07-19 NOTE — Progress Notes (Signed)
Assessment/Plan: 83 year old female with a past medical history of COPD, oxygen dependent, diastolic CHF, diabetes, chronic kidney disease found to have AKI with hyperkalemia initially requiring CRRT due to hypotension. Volume status was not improving with Lasix therefore HD restarted 07/14/16. Now ESRD and commit to HD. S/p R IJ dialysis catheter and R radial AVF placement 2/8.    AKI on CKD 4, now ESRD. S/p R IJ dialysis catheter and R radial AVF placement 2/8.  - has outpatient HD set up, M,W,F    Anemia of chronic diasease esa/Fe.  - s/p 2 units pRBC 2/8. Post-transfusion hgb stable    CAD  DM   Subjective: Interval History: S/p R IJ dialysis catheter and R radial AVF placement 2/8. No overnight events. No concerns this morning. Appetite is better  Objective: Vital signs in last 24 hours: Temp:  [97.8 F (36.6 C)-98.4 F (36.9 C)] 98.3 F (36.8 C) (02/10 0849) Pulse Rate:  [63-72] 72 (02/10 0849) Resp:  [17-20] 20 (02/10 0849) BP: (71-129)/(27-81) 110/81 (02/10 0849) SpO2:  [99 %-100 %] 100 % (02/10 0849) Weight:  [70.1 kg (154 lb 8.7 oz)-71.7 kg (158 lb 1.1 oz)] 70.1 kg (154 lb 8.7 oz) (02/09 2023) Weight change: 0.4 kg (14.1 oz)  Intake/Output from previous day: 02/09 0701 - 02/10 0700 In: 720 [P.O.:720] Out: 500  Intake/Output this shift: Total I/O In: 240 [P.O.:240] Out: 200 [Urine:200]  General appearance: alert and cooperative, in HD Heart: RRR, systolic murmur [id number] best hears at apex Lungs: CTAB,  normal effort Extremities: extremities normal, atraumatic, no cyanosis or edema, no sacral edema noted Right IJ dialysis catheter, R radial AVF without palpable thrill  Lab Results:  Recent Labs  07/17/16 2150 07/18/16 0531  WBC 8.8 9.0  HGB 10.9* 10.6*  HCT 34.2* 33.6*  PLT 208 214   BMET:   Recent Labs  07/17/16 2150 07/18/16 0531  NA 134* 134*  K 4.2 4.6  CL 96* 95*  CO2 28 27  GLUCOSE 322* 133*  BUN 21* 29*  CREATININE 1.82* 2.28*  CALCIUM  8.5* 9.1   No results for input(s): PTH in the last 72 hours. Iron Studies: No results for input(s): IRON, TIBC, TRANSFERRIN, FERRITIN in the last 72 hours. Studies/Results: Dg Chest Port 1 View  Result Date: 07/17/2016 CLINICAL DATA:  Central line placement. EXAM: PORTABLE CHEST 1 VIEW COMPARISON:  07/12/2016 FINDINGS: A right jugular dialysis catheter terminates over the lower SVC. The cardiac silhouette is mildly enlarged. There is persistent pulmonary vascular congestion and mild interstitial prominence, similar to the prior study. Veiling opacities in both lung bases also do not appear significantly changed and are compatible with layering pleural effusions. No pneumothorax is identified. Surgical clips are noted in the lower neck. No acute osseous abnormality is seen. IMPRESSION: 1. Right jugular catheter terminates over the lower SVC. 2. Cardiomegaly with similar appearance of mild interstitial edema and small bilateral pleural effusions. Electronically Signed   By: Logan Bores M.D.   On: 07/17/2016 16:10   Dg Fluoro Guide Cv Line-no Report  Result Date: 07/17/2016 Fluoroscopy was utilized by the requesting physician.  No radiographic interpretation.    Scheduled: . sodium chloride   Intravenous Once  . arformoterol  15 mcg Nebulization BID  . aspirin EC  81 mg Oral Daily  . atorvastatin  20 mg Oral q1800  . budesonide (PULMICORT) nebulizer solution  0.5 mg Nebulization BID  . darbepoetin (ARANESP) injection - DIALYSIS  150 mcg Intravenous Q Thu-HD  . feeding  supplement (NEPRO CARB STEADY)  237 mL Oral TID BM  . guaiFENesin  600 mg Oral BID  . heparin  5,000 Units Subcutaneous Q8H  . insulin aspart  0-9 Units Subcutaneous TID WC  . multivitamin with minerals  1 tablet Oral Daily  . sodium chloride  500 mL Intravenous Once  . sodium chloride flush  10-40 mL Intracatheter Q12H    LOS: 24 days   Smiley Houseman PGY 2 Family Medicine  Renal Attending: She is better overall  with dialysis and will go home today.  Her dry weight should not be lowered anymore than about 72kg as outpatient due to some low BP yesterday. Aryonna Gunnerson C

## 2016-07-19 NOTE — Discharge Instructions (Signed)
Dialysis Diet Introduction Dialysis is a treatment that cleans your blood. It is used when the kidneys are damaged. When you need dialysis, you should watch your diet. This is because some nutrients can build up in your blood between treatments and make you sick. These nutrients are:  Potassium.  Phosphorus.  Sodium. Your doctor or dietitian will:  Tell you how much of these you can have.  Tell you if you need to look out for other nutrients too.  Help you plan meals.  Tell you how much to drink each day. What do I need to know about this diet?  Limit potassium. Potassium is in milk, fruits, and vegetables.  Limit phosphorus. Phosphorus is in milk, cheese, beans, nuts, and carbonated beverages.  Limit salt (sodium). Foods that have a lot sodium include processed and cured meats, ready-made frozen meals, canned vegetables, and salty snack foods.  Do not use salt substitutes.  Try not to eat whole-grain foods and foods that have a lot of fiber.  Follow your doctor's instructions about how much to drink. You may be told to:  Write down what you drink.  Write down foods you eat that are made mostly from water, such as gelatin and soups.  Drink from small cups.  Ask your doctor if you should take a medicine that binds phosphorus.  Take vitamin and mineral supplements only as told by your doctor.  Eat meat, poultry, fish, and eggs. Limit nuts and beans.  Before you cook potatoes, cut them into small pieces. Then boil them in unsalted water.  Drain all fluid from cooked vegetables and canned fruits before you eat them. What foods can I eat? Grains  White bread. White rice. Cooked cereal. Unsalted popcorn. Tortillas. Pasta. Vegetables  Fresh or frozen broccoli, carrots, and green beans. Cabbage. Cauliflower. Celery. Cucumbers. Eggplant. Radishes. Zucchini. Fruits  Apples. Fresh or frozen berries. Fresh or canned pears, peaches, and pineapple. Grapes. Plums. Meats and  Other Protein Sources  Fresh or frozen beef, pork, chicken, and fish. Eggs. Low-sodium canned tuna or salmon. Dairy  Cream cheese. Heavy cream. Ricotta cheese. Beverages  Apple cider. Cranberry juice. Grape juice. Lemonade. Black coffee. Condiments  Herbs. Spices. Jam and jelly. Honey. Sweets and Desserts  Sherbet. Cakes. Cookies. Fats and Oils  Olive oil, canola oil, and safflower oil. Other  Non-dairy creamer. Non-dairy whipped topping. Homemade broth without salt. The items listed above may not be a complete list of recommended foods or beverages. Contact your dietitian for more options.  What foods are not recommended? Grains  Whole-grain bread. Whole-grain pasta. High-fiber cereal. Vegetables  Potatoes. Beets. Tomatoes. Winter squash and pumpkin. Asparagus. Spinach. Parsnips. Fruits  Star fruit. Bananas. Oranges. Kiwi. Nectarines. Prunes. Melon. Dried fruit. Avocado. Meats and Other Protein Sources  Canned, smoked, and cured meats. Soil scientist. Sardines. Nuts and seeds. Peanut butter. Beans and legumes. Dairy  Milk. Buttermilk. Yogurt. Cheese and cottage cheese. Processed cheese spreads. Beverages  Orange juice. Prune juice. Carbonated soft drinks. Condiments  Salt. Salt substitutes. Soy sauce. Sweets and Desserts  Ice cream. Chocolate. Candied nuts. Fats and Oils  Butter. Margarine. Other  Ready-made frozen meals. Canned soups. The items listed above may not be a complete list of foods and beverages to avoid. Contact your dietitian for more information.  This information is not intended to replace advice given to you by your health care provider. Make sure you discuss any questions you have with your health care provider. Document Released: 11/25/2011 Document Revised: 11/01/2015  Document Reviewed: 12/27/2013  2017 [employer]    Hemodialysis Hemodialysis is a way of removing wastes, salt, and extra water from your blood. It is done when your kidneys cannot  keep the blood clean. During hemodialysis, your blood travels outside of your body to a machine. A filter in the machine cleans the blood. Hemodialysis is usually done 3 times a week. Visits last 3-5 hours. What happens before the procedure? An opening must be made to allow blood to be taken from your body and put back into your body (access). It is made weeks or months before you start hemodialysis. It is usually made in your arm. If you need hemodialysis right away, a thin, flexible tube (catheter) will be placed in your neck, chest, or groin. This type of access is usually temporary. What happens during the procedure? Hemodialysis is done while you are sitting or leaning back. During hemodialysis you may do any activity as long as you stay sitting or leaning back. Many people sleep, watch television, or read. If you have side effects or feel uncomfortable, tell your doctor. Your doctor may make changes to help you feel more comfortable. Your hemodialysis visits may look like this: 1. You will be weighed and your temperature will be taken. Your blood pressure and pulse will be checked. 2. The skin around your access will be cleaned. 3. Two needles will be put into the access. They will be connected to a plastic tube. The needles will be taped to your skin so that they do not move. If you have a temporary access, your catheter will be connected to a plastic tube. 4. Your blood will go through the tube to the machine. The machine will clean your blood. Then your blood will go back to your body. Your blood pressure and pulse will be checked a few times. 5. Once the procedure is complete, the needles will be removed. A bandage (dressing) will be placed over the access. If your access is a catheter, it will be disconnected. What happens after the procedure?  You will be weighed.  Your blood will be tested. This is usually done once a month.  You may have side effects. These  include:  Dizziness.  Muscle cramps.  Feeling sick to your stomach (nausea).  Headaches.  Feeling tired. You usually feel normal the next day.  Itchiness. Your doctor may give medicine to help with this.  Achy or jittery legs. You may feel like kicking your legs. This can cause sleeping problems.  Allergic reaction. This information is not intended to replace advice given to you by your health care provider. Make sure you discuss any questions you have with your health care provider. Document Released: 05/08/2008 Document Revised: 11/01/2015 Document Reviewed: 07/12/2012 [employer] Interactive Patient Education  2017 [employer].

## 2016-07-19 NOTE — Progress Notes (Addendum)
   07/18/16 2300  Vitals  BP (!) 87/27  BP Location Left Leg  BP Method Automatic  Patient Position (if appropriate) Lying  Pulse Rate Source Dinamap   Patient  Stating MD aware of obtaining  B/p in calf  ,I do not see ORDER  To cover this   Received  250cc saline bolus

## 2016-07-19 NOTE — Discharge Summary (Signed)
[facility]  Physician Discharge Summary   Patient ID: Debbie [name] MRN: [id number] DOB/AGE: Jun 24, 1930 83 y.o.  Admit date: 06/25/2016 Discharge date: 07/19/2016  PCP: Myriam Jacobson, MD  DISCHARGE DIAGNOSES:  Active Problems:   Renal failure   AKI (acute kidney injury) ([place])   Hyperkalemia   Atrial fibrillation (HCC)   Bradycardia   Central venous catheter in place   RECOMMENDATIONS FOR OUTPATIENT FOLLOW UP: 1. Patient to start outpatient dialysis from this Monday 2. Home health and home medical equipment has been ordered   DISCHARGE CONDITION: fair  Diet recommendation: Renal  Filed Weights   07/18/16 1619 07/18/16 1858 07/18/16 2023  Weight: 71.7 kg (158 lb 1.1 oz) 70.5 kg (155 lb 6.8 oz) 70.1 kg (154 lb 8.7 oz)    INITIAL HISTORY: 83 year old female with a past medical history of COPD, oxygen dependent, diastolic CHF, diabetes, chronic kidney disease, presented with shortness of breath and was found to have low oxygen levels and low blood pressure. She was found to have acute on chronic kidney disease. Initially admitted to step down then had to be transferred to ICU due to hypotension. Started on hemodialysis.   Consultants: Nephrology. Vascular surgery.  Procedures:  Hemodialysis  Placement of fistula in the right arm 2/8  Tunneled HD catheter 2/8   HOSPITAL COURSE:   Acute kidney injury on chronic kidney disease, stage 3 -Baseline creatinine is 2.1 from 1/5, presented with creatinine of 6.9. -Initially admitted to the ICU because of hypotension. Nephrology was consulted. The patient started on CRRT. -She was also placed on high dose Lasix. She was getting intermittent hemodialysis.  -Now deemed to have end-stage renal disease. Will need long-term dialysis. Vascular surgery consulted. AV fistula and tunneled HD catheter placed 2/8. -She will start outpatient dialysis is Monday at [facility] in [place].  Acute on chronic  hypoxic resp failure -From reviewing the notes patient started on oxygen when she was discharged from the hospital on Christmas of 2017 -Currently sats 100% on 2 L try to wean off of oxygen. Home health to monitor oxygen levels at home. She may be able to come off of oxygen, eventually.  Biateral pleural effusion -Likely due to fluid overload. Patient has had pleural effusions since December.  -Bilateral effusion will improve with dialysis. Thoracentesis was considered, however, it is felt that dialysis will help her more.  Chronic Diastolic heart failure -This is based off of an echocardiogram from December. Previously on torsemide.  Volume being managed with hemodialysis.  Episodic bradycardia -Thought to be secondary to Hyperkalemia versus beta blocker. Resolved.  Recent H flu +/-HCAP -Stable. Resolved.  History of COPD  -Stable. Apparently on home oxygen. Continue home medications.  Anemia of chronic disease -Patient did require blood transfusion during this hospitalization. No overt bleeding was noted. Low iron and ferritin levels noted. Parenteral Iron was given.   Diabetes mellitus type II Patient was on metformin and sliding scale coverage at home. Metformin will need to be discontinued. We'll recommend that she only stay on sliding scale coverage. Last HbA1c was 5.4 in December. PCP to address further at follow-up.  History of Endometrial adenocarcinoma status post XRT -Stable, just finished XRT with Dr. Sondra Come on 05/22/16.  Overall, stable and improved. Patient is excited to go home today. She did have an episode of low blood pressure last night after dialysis. Resolved with IV fluids. Discussed with Dr. Florene Glen. He will communicate to the outpatient dialysis center to a for higher dry weight. Stable  for discharge today.   PERTINENT LABS:  The results of significant diagnostics from this hospitalization (including imaging, microbiology, ancillary and laboratory)  are listed below for reference.    Microbiology: Recent Results (from the past 240 hour(s))  MRSA PCR Screening     Status: Abnormal   Collection Time: 07/12/16  6:15 AM  Result Value Ref Range Status   MRSA by PCR POSITIVE (A) NEGATIVE Final    Comment:        The GeneXpert MRSA Assay (FDA approved for NASAL specimens only), is one component of a comprehensive MRSA colonization surveillance program. It is not intended to diagnose MRSA infection nor to guide or monitor treatment for MRSA infections. RESULT CALLED TO, READ BACK BY AND VERIFIED WITH: A. BRAKE 0759 02.03.2018 N. MORRIS      Labs: Basic Metabolic Panel:  Recent Labs Lab 07/16/16 0417 07/17/16 0412 07/17/16 2150 07/18/16 0531 07/19/16 1234  NA 134* 135 134* 134* 139  K 3.5 3.3* 4.2 4.6 3.8  CL 93* 98* 96* 95* 101  CO2 31 27 28 27 27   GLUCOSE 102* 94 322* 133* 157*  BUN 21* 34* 21* 29* 25*  CREATININE 1.88* 2.72* 1.82* 2.28* 2.30*  CALCIUM 8.0* 7.9* 8.5* 9.1 8.9  PHOS 1.8* 2.9 2.5 2.8 2.0*   Liver Function Tests:  Recent Labs Lab 07/16/16 0417 07/17/16 0412 07/17/16 2150 07/18/16 0531 07/19/16 1234  ALBUMIN 2.4* 2.2* 2.6* 2.6* 2.5*   CBC:  Recent Labs Lab 07/15/16 0930 07/17/16 0412 07/17/16 0802 07/17/16 2150 07/18/16 0531 07/19/16 1234  WBC 6.1 5.4  --  8.8 9.0 10.0  HGB 7.7* 6.8* 7.3* 10.9* 10.6* 10.7*  HCT 25.7* 22.3* 23.7* 34.2* 33.6* 34.9*  MCV 100.4* 99.6  --  95.3 94.9 98.6  PLT 288 219  --  208 214 222    CBG:  Recent Labs Lab 07/18/16 0750 07/18/16 1121 07/18/16 2056 07/19/16 0812 07/19/16 1226  GLUCAP 127* 203* 129* 130* 189*     IMAGING STUDIES Dg Chest 2 View  Result Date: 07/12/2016 CLINICAL DATA:  Pt c/o productive cough for 1 week. Pt denies chest pain or any pain at this time. Hx COPD, HTN, Diabetes, nonsmoker. EXAM: CHEST  2 VIEW COMPARISON:  07/07/2016 FINDINGS: Right IJ dialysis catheter tip overlies the superior vena cava. The heart is mildly  enlarged. There are bibasilar opacities which obscure the hemidiaphragms and are associated bilateral pleural effusions. Interstitial edema suspected. There has been improvement in aeration over recent studies. IMPRESSION: 1. Cardiomegaly and bilateral pleural effusions are stable. 2. Improvement in aeration ; persistent interstitial edema. Electronically Signed   By: Nolon Nations M.D.   On: 07/12/2016 16:34   Dg Chest 2 View  Result Date: 07/07/2016 CLINICAL DATA:  Shortness of breath. EXAM: CHEST  2 VIEW COMPARISON:  Radiographs of July 03, 2016. FINDINGS: Stable cardiomediastinal silhouette. Atherosclerosis of thoracic aorta is noted. No pneumothorax is noted. Stable bilateral pleural effusions are noted with underlying atelectasis. Bony thorax is unremarkable. Right internal jugular catheter is noted with distal tip in expected position of SVC. IMPRESSION: Stable moderate bilateral pleural effusions with probable underlying atelectasis or edema. Aortic atherosclerosis. Electronically Signed   By: Marijo Conception, M.D.   On: 07/07/2016 10:34   Dg Chest 2 View  Result Date: 07/03/2016 CLINICAL DATA:  Cough, congestion, history of CHF EXAM: CHEST  2 VIEW COMPARISON:  Chest x-ray of 06/28/2016 FINDINGS: There is cardiomegaly present with bilateral pleural effusions of moderate size and pulmonary vascular  congestion, findings consistent with congestive heart failure. Right central venous line tip overlies the mid SVC. Moderate thoracic aortic atherosclerotic change is present. No acute bony abnormality is seen. There are degenerative changes throughout the thoracic spine. IMPRESSION: 1. CHF with moderate-sized pleural effusions. 2. Moderate thoracic aortic atherosclerosis. Electronically Signed   By: Ivar Drape M.D.   On: 07/03/2016 11:54   Dg Chest 2 View  Result Date: 06/25/2016 CLINICAL DATA:  Shortness of breath and cough. EXAM: CHEST  2 VIEW COMPARISON:  06/08/2016 FINDINGS: The cardio  pericardial silhouette is enlarged. There is pulmonary vascular congestion with possible interstitial pulmonary edema. Bibasilar collapse/ consolidation with bilateral pleural effusions, right greater than left. The visualized bony structures of the thorax are intact. Telemetry leads overlie the chest. IMPRESSION: Cardiomegaly with vascular congestion and bibasilar collapse/ consolidation. Small to moderate bilateral pleural effusions, mildly progressed in the interval since the prior study. Electronically Signed   By: Misty Stanley M.D.   On: 06/25/2016 14:02   US Renal  Result Date: 06/26/2016 CLINICAL DATA:  Acute kidney injury. EXAM: RENAL / URINARY TRACT ULTRASOUND COMPLETE COMPARISON:  CT 12/17/2011 FINDINGS: Right Kidney: Length: 7.8 cm. No hydronephrosis. Marked thinning of renal parenchyma with increased renal echogenicity. Two renal cysts, with a 2.3 cm cyst a lower kidney, and 1.5 cm cyst in the mid kidney. No evidence of solid mass. Left Kidney: Length: 11.3 cm. No hydronephrosis. There is thinning of renal parenchyma with increased renal echogenicity there are 2 renal cysts, 2.3 cm cyst in the interpolar kidney and 1.9 cm cyst in the upper kidney. No evidence of solid lesion. Bladder: Decompressed by Foley catheter. IMPRESSION: 1. No obstructive uropathy. 2. Thinning of the renal parenchyma with increased renal echogenicity, right greater than left, consistent with chronic medical renal disease. 3. Bilateral renal cysts. Electronically Signed   By: Jeb Levering M.D.   On: 06/26/2016 03:01   Dg Chest Port 1 View  Result Date: 07/17/2016 CLINICAL DATA:  Central line placement. EXAM: PORTABLE CHEST 1 VIEW COMPARISON:  07/12/2016 FINDINGS: A right jugular dialysis catheter terminates over the lower SVC. The cardiac silhouette is mildly enlarged. There is persistent pulmonary vascular congestion and mild interstitial prominence, similar to the prior study. Veiling opacities in both lung bases also  do not appear significantly changed and are compatible with layering pleural effusions. No pneumothorax is identified. Surgical clips are noted in the lower neck. No acute osseous abnormality is seen. IMPRESSION: 1. Right jugular catheter terminates over the lower SVC. 2. Cardiomegaly with similar appearance of mild interstitial edema and small bilateral pleural effusions. Electronically Signed   By: Logan Bores M.D.   On: 07/17/2016 16:10   Dg Chest Port 1 View  Result Date: 06/28/2016 CLINICAL DATA:  Shortness of breath.  Wheezing. EXAM: PORTABLE CHEST 1 VIEW COMPARISON:  Radiographs yesterday. FINDINGS: Tip of the right internal jugular central venous catheter remains in the SVC. Cardiomegaly is stable. Bilateral pleural effusions with bibasilar atelectasis and volume loss, unchanged from prior exam. Unchanged pulmonary edema. No new abnormality. IMPRESSION: Congestive failure, unchanged in appearance from prior exams. Electronically Signed   By: Jeb Levering M.D.   On: 06/28/2016 05:15   Dg Chest Port 1 View  Result Date: 06/27/2016 CLINICAL DATA:  Hypoxia. EXAM: PORTABLE CHEST 1 VIEW COMPARISON:  06/25/2016. FINDINGS: Right IJ line in stable position. Cardiomegaly with pulmonary vascular prominence and bilateral interstitial prominence. Bilateral pleural effusions. Findings consistent with congestive heart failure. Low lung volumes with basilar atelectasis.  No pneumothorax . IMPRESSION: 1.  Right IJ line in stable position. 2. Congestive heart failure with bilateral pulmonary interstitial edema and bilateral pleural effusions. No pneumothorax. Electronically Signed   By: Marcello Moores  Register   On: 06/27/2016 06:44   Dg Chest Port 1 View  Result Date: 06/25/2016 CLINICAL DATA:  83 y/o  F; central line placement. EXAM: PORTABLE CHEST 1 VIEW COMPARISON:  06/25/2016 chest radiograph. FINDINGS: The bulb of the enlarged cardiac silhouette. Stable small to moderate bilateral effusions with bibasilar  opacities. Right central venous catheter tip projects over the mid SVC. Aortic atherosclerosis with calcifications. No acute osseous abnormality is evident. IMPRESSION: Right central venous catheter tip projects over the mid SVC. Stable small-moderate bilateral pleural effusions and associated bibasilar opacities probably representing atelectasis. Electronically Signed   By: Kristine Garbe M.D.   On: 06/25/2016 23:38   Dg Fluoro Guide Cv Line-no Report  Result Date: 07/17/2016 Fluoroscopy was utilized by the requesting physician.  No radiographic interpretation.    DISCHARGE EXAMINATION: Vitals:   07/18/16 2300 07/19/16 0225 07/19/16 0509 07/19/16 0849  BP: (!) 87/27 (!) 105/28 (!) 129/54 110/81  Pulse:   71 72  Resp:   19 20  Temp:   98.4 F (36.9 C) 98.3 F (36.8 C)  TempSrc:   Oral Oral  SpO2:   100% 100%  Weight:      Height:       General appearance: alert, cooperative, appears stated age and no distress Resp: Diminished air entry at the bases. No crackles or wheezing. Improved air entry overall. Cardio: regular rate and rhythm, S1, S2 normal, no murmur, click, rub or gallop GI: soft, non-tender; bowel sounds normal; no masses,  no organomegaly Extremities: extremities normal, atraumatic, no cyanosis or edema  DISPOSITION: Home with son  Discharge Instructions    Call MD for:  difficulty breathing, headache or visual disturbances    Complete by:  As directed    Call MD for:  extreme fatigue    Complete by:  As directed    Call MD for:  persistant dizziness or light-headedness    Complete by:  As directed    Call MD for:  persistant nausea and vomiting    Complete by:  As directed    Call MD for:  severe uncontrolled pain    Complete by:  As directed    Call MD for:  temperature >100.4    Complete by:  As directed    Discharge instructions    Complete by:  As directed    Please go for dialysis at the dialysis center as instructed, to start on Monday  2/12.  You were cared for by a hospitalist during your hospital stay. If you have any questions about your discharge medications or the care you received while you were in the hospital after you are discharged, you can call the unit and asked to speak with the hospitalist on call if the hospitalist that took care of you is not available. Once you are discharged, your primary care physician will handle any further medical issues. Please note that NO REFILLS for any discharge medications will be authorized once you are discharged, as it is imperative that you return to your primary care physician (or establish a relationship with a primary care physician if you do not have one) for your aftercare needs so that they can reassess your need for medications and monitor your lab values. If you do not have a primary care physician, you can  call [phone] for a physician referral.   Increase activity slowly    Complete by:  As directed       ALLERGIES:  Allergies  Allergen Reactions  . Codeine Other (See Comments)    Unusual mouth sensation     Current Discharge Medication List    START taking these medications   Details  guaiFENesin (MUCINEX) 600 MG 12 hr tablet Take 1 tablet (600 mg total) by mouth 2 (two) times daily as needed. Qty: 30 tablet, Refills: 0    Nutritional Supplements (FEEDING SUPPLEMENT, NEPRO CARB STEADY,) LIQD Take 237 mLs by mouth 3 (three) times daily between meals. Qty: 90 Can, Refills: 0      CONTINUE these medications which have NOT CHANGED   Details  aspirin EC 81 MG tablet Take 81 mg by mouth daily.    atorvastatin (LIPITOR) 20 MG tablet Take 20 mg by mouth daily.    insulin aspart (NOVOLOG) 100 UNIT/ML injection Inject 1-7 Units into the skin 3 (three) times daily as needed for high blood sugar. 101-150: 1 unit, 151-200: 2 units, 201-250=3 units, 251-300: 5 units, 301-350: 7 units, >350: 9 units, Call MD for BS>400 or <60    ipratropium-albuterol (DUONEB) 0.5-2.5  (3) MG/3ML SOLN Take 3 mLs by nebulization every 4 (four) hours as needed (sob).    mometasone-formoterol (DULERA) 100-5 MCG/ACT AERO Inhale 2 puffs into the lungs 2 (two) times daily. Qty: 1 Inhaler, Refills: 0    Multiple Vitamin (MULTIVITAMIN) tablet Take 1 tablet by mouth daily. Centrum silver    sodium chloride (OCEAN) 0.65 % SOLN nasal spray Place 1 spray into both nostrils as needed for congestion.    Albuterol Sulfate (PROAIR RESPICLICK) 123XX123 (90 Base) MCG/ACT AEPB Inhale 1-2 puffs into the lungs every 4 (four) hours as needed. Qty: 1 each, Refills: 1      STOP taking these medications     amLODipine (NORVASC) 10 MG tablet      atenolol (TENORMIN) 25 MG tablet      furosemide (LASIX) 20 MG tablet      hydrALAZINE (APRESOLINE) 25 MG tablet      lisinopril (PRINIVIL,ZESTRIL) 5 MG tablet      metFORMIN (GLUCOPHAGE-XR) 500 MG 24 hr tablet      predniSONE (DELTASONE) 20 MG tablet      levofloxacin (LEVAQUIN) 500 MG tablet           Durable Medical Equipment        Start     Ordered   07/18/16 1312  For home use only DME oxygen  Once    Question Answer Comment  Mode or (Route) Nasal cannula   Liters per Minute 2   Frequency Continuous (stationary and portable oxygen unit needed)   Oxygen conserving device Yes   Oxygen delivery system Gas      07/18/16 1312   07/08/16 1147  For home use only DME Hospital bed  Once    Comments:  Please contact pt son re delivery of wheelchair and hospital bed : Tatiana Tippens  Ph [phone].  Question Answer Comment  Patient has (list medical condition): Oxygen dependent COPD, CHF, DM , HTN   The above medical condition requires: Patient requires the ability to reposition frequently   Head must be elevated greater than: 30 degrees   Bed type Semi-electric   Support Surface: Alternating Pressure Pad and Pump      07/07/16 1150   07/08/16 1142  For home use only DME  lightweight manual wheelchair with seat cushion  Once     Comments:  Patient suffers from oxygen dependent COPD and CHF which impairs their ability to perform daily activities like ambulating in the home and traveling to hemodialysis in the home.  A cane or walker will not resolve  issue with performing activities of daily living. A wheelchair will allow patient to safely perform daily activities. Patient is not able to propel themselves in the home using a standard weight wheelchair due to weakness, COPD and CHF. Patient can self propel in the lightweight wheelchair.  Accessories: elevating leg rests (ELRs), wheel locks, extensions and anti-tippers. Will ask for wheelchair to be delivered to home with hospital bed please contact pt son re delivery   07/07/16 1150   07/07/16 1141  For home use only DME 3 n 1  Once     07/07/16 1150       Follow-up Information    ROBERTS, Sharol Given, MD.   Specialty:  Internal Medicine Why:  As needed Contact information: [place], [place] [address] [phone]        [name], MD In 6 weeks.   Specialties:  Vascular Surgery, Cardiology Why:  Office will call you to arrange your appt (sent) Contact information: [place] [address] [phone]           TOTAL DISCHARGE TIME: 3 mins  [facility] Pager [phone]  07/19/2016, 2:57 PM

## 2016-07-29 NOTE — Progress Notes (Signed)
Patient left a bag of medications at the charge desk. Patient had already been discharged. Spoke to son Debbie [name] regarding medications. The medications were listed as discontinued on discharge instructions. Per son of patient, dispose of medications as appropriate.  Sheliah Plane RN

## 2016-08-19 ENCOUNTER — Encounter (HOSPITAL_COMMUNITY): Payer: Medicare HMO

## 2016-08-19 ENCOUNTER — Ambulatory Visit: Payer: Medicare HMO | Admitting: Family

## 2016-09-03 ENCOUNTER — Ambulatory Visit (HOSPITAL_COMMUNITY)
Admission: RE | Admit: 2016-09-03 | Discharge: 2016-09-03 | Disposition: A | Discharge status: Home / Self Care | Payer: Medicare HMO | Source: Ambulatory Visit | Attending: Vascular Surgery | Admitting: Vascular Surgery

## 2016-09-03 ENCOUNTER — Ambulatory Visit (INDEPENDENT_AMBULATORY_CARE_PROVIDER_SITE_OTHER): Payer: Self-pay | Admitting: Vascular Surgery

## 2016-09-03 ENCOUNTER — Ambulatory Visit (INDEPENDENT_AMBULATORY_CARE_PROVIDER_SITE_OTHER): Admit: 2016-09-03 | Discharge: 2016-09-03 | Disposition: A | Discharge status: Home / Self Care | Payer: Medicare HMO

## 2016-09-03 ENCOUNTER — Encounter: Payer: Self-pay | Admitting: Vascular Surgery

## 2016-09-03 VITALS — BP 124/74 | HR 73 | Temp 98.2°F | Resp 20 | Ht 65.5 in | Wt 160.0 lb

## 2016-09-03 DIAGNOSIS — N185 Chronic kidney disease, stage 5: Secondary | ICD-10-CM | POA: Insufficient documentation

## 2016-09-03 DIAGNOSIS — I6523 Occlusion and stenosis of bilateral carotid arteries: Secondary | ICD-10-CM | POA: Diagnosis not present

## 2016-09-03 DIAGNOSIS — Z9889 Other specified postprocedural states: Secondary | ICD-10-CM

## 2016-09-03 DIAGNOSIS — Z48812 Encounter for surgical aftercare following surgery on the circulatory system: Secondary | ICD-10-CM | POA: Diagnosis not present

## 2016-09-03 LAB — VAS US CAROTID
LICADDIAS: -29 cm/s
LICAPDIAS: -12 cm/s
Left CCA dist dias: 3 cm/s
Left CCA dist sys: 115 cm/s
Left CCA prox dias: 5 cm/s
Left CCA prox sys: 84 cm/s
Left ICA dist sys: -168 cm/s
Left ICA prox sys: -77 cm/s
RCCAPDIAS: 4 cm/s
RCCAPSYS: 50 cm/s
Right cca dist sys: -75 cm/s

## 2016-09-03 NOTE — Progress Notes (Signed)
Patient name: Debbie [name] MRN: [id number] DOB: [birth year] Sex: female  REASON FOR VISIT: Follow up after right radiocephalic fistula  HPI: Debbie [name] is a 83 y.o. female who had placement of a tunneled dialysis catheter and a right upper arm radiocephalic fistula on 73/41/9379. She comes in for 6 week follow up visit. At the time of surgery she was noted to have a high bifurcation of the brachial artery and the anastomosis was at the antecubital level to the radial artery.  In addition she is undergone previous bilateral carotid endarterectomies and was due for a follow up carotid duplex scan. She denies any history of stroke, TIAs, expressive or receptive aphasia, or emesis fugax.  Current Outpatient Prescriptions  Medication Sig Dispense Refill  . Albuterol Sulfate (PROAIR RESPICLICK) 024 (90 Base) MCG/ACT AEPB Inhale 1-2 puffs into the lungs every 4 (four) hours as needed. 1 each 1  . aspirin EC 81 MG tablet Take 81 mg by mouth daily.    [name] atorvastatin (LIPITOR) 20 MG tablet Take 20 mg by mouth daily.    . B Complex-C-Folic Acid (NEPHRO-VITE PO) Take by mouth.    [name] guaiFENesin (MUCINEX) 600 MG 12 hr tablet Take 1 tablet (600 mg total) by mouth 2 (two) times daily as needed. 30 tablet 0  . Multiple Vitamin (MULTIVITAMIN) tablet Take 1 tablet by mouth daily. Centrum silver    . sodium chloride (OCEAN) 0.65 % SOLN nasal spray Place 1 spray into both nostrils as needed for congestion.     No current facility-administered medications for this visit.     REVIEW OF SYSTEMS:  [X]  denotes positive finding, [ ]  denotes negative finding Cardiac  Comments:  Chest pain or chest pressure:    Shortness of breath upon exertion:    Short of breath when lying flat:    Irregular heart rhythm:    Constitutional    Fever or chills:      PHYSICAL EXAM: Vitals:   09/03/16 1514  BP: 124/74  Pulse: 73  Resp: 20  Temp: 98.2 F (36.8 C)  TempSrc: Oral  SpO2: 98%  Weight: 160 lb  (72.6 kg)  Height: 5' 5.5" (1.664 m)    GENERAL: The patient is a well-nourished female, in no acute distress. The vital signs are documented above. CARDIOVASCULAR: There is a regular rate and rhythm. PULMONARY: There is good air exchange bilaterally without wheezing or rales. Her right upper arm fistula has a good bruit and thrill. She has a palpable right radial pulse.  CAROTID DUPLEX: I have independently interpreted her carotid duplex scan today. She has no evidence of recurrent carotid stenosis on either side. She has known retrograde flow in the left vertebral artery.  DUPLEX OF RIGHT UPPER ARM FISTULA: [name] interpreted the duplex of her right upper arm fistula. The diameters of the fistula range from 0.5-0.67 cm.  MEDICAL ISSUES:  STATUS POST RIGHT UPPER ARM RADIOCEPHALIC FISTULA: Her fistula appears to be maturing adequately. However it is not yet adequate size for dialysis. The fistula was just placed on 07/17/2016. I would think that it should be ready for cannulation in late May. If the dialysis center has any concerns and certainly we could see her back and consider a fistulogram if the vein is still not adequate in size. I've encouraged her to exercise her hand.  STATUS POST BILATERAL CAROTID ENDARTERECTOMIES: Her carotid duplex scan shows no evidence of recurrent carotid stenoses. I ordered a follow up duplex scan in 1 year  and she will see the nurse practitioner or PA at that time. She is on aspirin and is on a statin.  [name] [facility] [phone]

## 2016-09-04 NOTE — Addendum Note (Signed)
Addended by: Lianne Cure A on: 09/04/2016 09:38 AM   Modules accepted: Orders

## 2016-09-17 ENCOUNTER — Ambulatory Visit
Admission: RE | Admit: 2016-09-17 | Discharge: 2016-09-17 | Disposition: A | Discharge status: Home / Self Care | Payer: Medicare HMO | Source: Ambulatory Visit | Attending: Radiation Oncology | Admitting: Radiation Oncology

## 2016-09-17 ENCOUNTER — Encounter: Payer: Self-pay | Admitting: Radiation Oncology

## 2016-09-17 VITALS — BP 117/94 | HR 78 | Temp 97.8°F | Ht 65.5 in | Wt 159.8 lb

## 2016-09-17 DIAGNOSIS — Z79899 Other long term (current) drug therapy: Secondary | ICD-10-CM | POA: Insufficient documentation

## 2016-09-17 DIAGNOSIS — C52 Malignant neoplasm of vagina: Secondary | ICD-10-CM | POA: Diagnosis not present

## 2016-09-17 DIAGNOSIS — C541 Malignant neoplasm of endometrium: Secondary | ICD-10-CM | POA: Diagnosis not present

## 2016-09-17 DIAGNOSIS — Z885 Allergy status to narcotic agent status: Secondary | ICD-10-CM | POA: Insufficient documentation

## 2016-09-17 DIAGNOSIS — R197 Diarrhea, unspecified: Secondary | ICD-10-CM | POA: Insufficient documentation

## 2016-09-17 DIAGNOSIS — Z992 Dependence on renal dialysis: Secondary | ICD-10-CM | POA: Diagnosis not present

## 2016-09-17 DIAGNOSIS — Y842 Radiological procedure and radiotherapy as the cause of abnormal reaction of the patient, or of later complication, without mention of misadventure at the time of the procedure: Secondary | ICD-10-CM | POA: Diagnosis not present

## 2016-09-17 DIAGNOSIS — Z7982 Long term (current) use of aspirin: Secondary | ICD-10-CM | POA: Insufficient documentation

## 2016-09-17 DIAGNOSIS — C7982 Secondary malignant neoplasm of genital organs: Secondary | ICD-10-CM

## 2016-09-17 NOTE — Progress Notes (Signed)
Radiation Oncology         [phone] ________________________________  Name: Debbie [name] MRN: [id number]  Date: 09/17/2016  DOB: 02/09/1931  Follow-Up Visit Note  CC: Myriam Jacobson, MD  Marti Sleigh    ICD-9-CM ICD-10-CM   1. Secondary malignant neoplasm of vagina [facility]) 198.82 C79.82     Diagnosis:  Recurrent endometrial adenocarcinoma (FIGO III), poorly differentiated  Interval Since Last Radiation:  5 months 02/12/16-03/19/16: 50 Gy to the pelvis in 25 fractions 04/09/16, 04/15/16, 04/22/16: 18 Gy to the vaginal cuff in 3 fractions  Narrative:  The patient returns today for routine follow-up. She reports hospitalization twice in December for the flu and for kidney failure. She was started on dialysis in January and she is currently going to dialysis three times a week. She reports she urinates very little, and also reports occasional diarrhea. Her son reports, she is confused. She denies pain, vaginal bleeding/discharge, or fatigue.                         ALLERGIES:  is allergic to codeine.  Meds: Current Outpatient Prescriptions  Medication Sig Dispense Refill  . Albuterol Sulfate (PROAIR RESPICLICK) 892 (90 Base) MCG/ACT AEPB Inhale 1-2 puffs into the lungs every 4 (four) hours as needed. 1 each 1  . aspirin EC 81 MG tablet Take 81 mg by mouth daily.    . B Complex-C-Folic Acid (NEPHRO-VITE PO) Take by mouth.    [name] guaiFENesin (MUCINEX) 600 MG 12 hr tablet Take 1 tablet (600 mg total) by mouth 2 (two) times daily as needed. 30 tablet 0  . Multiple Vitamin (MULTIVITAMIN) tablet Take 1 tablet by mouth daily. Centrum silver    . atorvastatin (LIPITOR) 20 MG tablet Take 20 mg by mouth daily.    . sodium chloride (OCEAN) 0.65 % SOLN nasal spray Place 1 spray into both nostrils as needed for congestion.     No current facility-administered medications for this encounter.     Physical Findings: The patient is in no acute distress. Patient is alert and  oriented.  height is 5' 5.5" (1.664 m) and weight is 159 lb 12.8 oz (72.5 kg). Her oral temperature is 97.8 F (36.6 C). Her blood pressure is 117/94 (abnormal) and her pulse is 78. Her oxygen saturation is 100%. .  No significant changes. Lungs are clear to auscultation bilaterally. Heart has regular rate and rhythm. No palpable cervical, supraclavicular, or axillary adenopathy. Abdomen soft, non-tender, normal bowel sounds. On pelvic examination the external genitalia were unremarkable. A speculum exam was performed. Some radiation changes noted in the vaginal vault. No palpable or visible cancer .There are no mucosal lesions noted in the vaginal vault. On bimanual and rectovaginal examination there were no pelvic masses appreciated.    Lab Findings: Lab Results  Component Value Date   WBC 10.0 07/19/2016   HGB 10.7 (L) 07/19/2016   HCT 34.9 (L) 07/19/2016   MCV 98.6 07/19/2016   PLT 222 07/19/2016    Radiographic Findings: No results found.  Impression:  The patient is recovering from the effects of radiation.  No evidence of recurrence on clinical exam today. Given the patient's confusion and overall medical situation,  I do not feel she would be able to safely use the vaginal dilator.  Plan:  The patient will follow up with radiation oncology in 6 months. Follow up with Dr. Fermin Schwab in 3 months.  ____________________________________    This document serves as  a record of services personally performed by Gery Pray, MD. It was created on his behalf by Bethann Humble, a trained medical scribe. The creation of this record is based on the scribe's personal observations and the provider's statements to them. This document has been checked and approved by the attending provider.

## 2016-09-17 NOTE — Progress Notes (Signed)
Debbie [name] is here for follow up.  She denies having pain.  She was hospitalized for the flu in December twice and had kidney failure.  She is going to dialysis three times a week.  She reports that she urinates very little.  Her son reports she has diarrhea occasionally.  She denies having any vaginal bleeding or discharge.  She denies having fatigued.  Her son reports that she is confused.  BP (!) 117/94 (BP Location: Right Leg, Patient Position: Sitting)   Pulse 78   Temp 97.8 F (36.6 C) (Oral)   Ht 5' 5.5" (1.664 m)   Wt 159 lb 12.8 oz (72.5 kg)   SpO2 100%   BMI 26.19 kg/m    Wt Readings from Last 3 Encounters:  09/17/16 159 lb 12.8 oz (72.5 kg)  09/03/16 160 lb (72.6 kg)  07/18/16 154 lb 8.7 oz (70.1 kg)

## 2016-09-18 ENCOUNTER — Telehealth: Payer: Self-pay | Admitting: *Deleted

## 2016-09-18 ENCOUNTER — Ambulatory Visit: Payer: Self-pay | Admitting: Radiation Oncology

## 2016-09-18 NOTE — Telephone Encounter (Signed)
CALLED PATIENT TO INFORM OF FU ON 03-26-17 @ 10:15 AM WITH DR. KINARD, SPOKE WITH PATIENT'S SON- [name] AND HE IS AWARE OF THIS APPT.

## 2016-09-18 NOTE — Addendum Note (Signed)
Encounter addended by: Jacqulyn Liner, RN on: 09/18/2016 11:35 AM<BR>    Actions taken: Charge Capture section accepted

## 2016-09-30 ENCOUNTER — Other Ambulatory Visit: Payer: Self-pay | Admitting: Surgery

## 2016-09-30 ENCOUNTER — Encounter (HOSPITAL_BASED_OUTPATIENT_CLINIC_OR_DEPARTMENT_OTHER): Payer: Medicare HMO | Attending: Surgery

## 2016-09-30 ENCOUNTER — Ambulatory Visit
Admission: RE | Admit: 2016-09-30 | Discharge: 2016-09-30 | Disposition: A | Discharge status: Home / Self Care | Payer: Medicare HMO | Source: Ambulatory Visit | Attending: Family Medicine | Admitting: Family Medicine

## 2016-09-30 ENCOUNTER — Ambulatory Visit
Admission: RE | Admit: 2016-09-30 | Discharge: 2016-09-30 | Disposition: A | Discharge status: Home / Self Care | Payer: Medicare HMO | Source: Ambulatory Visit | Attending: Surgery | Admitting: Surgery

## 2016-09-30 ENCOUNTER — Other Ambulatory Visit: Payer: Self-pay | Admitting: Family Medicine

## 2016-09-30 DIAGNOSIS — M7989 Other specified soft tissue disorders: Secondary | ICD-10-CM

## 2016-09-30 DIAGNOSIS — Z66 Do not resuscitate: Secondary | ICD-10-CM | POA: Insufficient documentation

## 2016-09-30 DIAGNOSIS — L98499 Non-pressure chronic ulcer of skin of other sites with unspecified severity: Secondary | ICD-10-CM

## 2016-09-30 DIAGNOSIS — L89614 Pressure ulcer of right heel, stage 4: Secondary | ICD-10-CM | POA: Insufficient documentation

## 2016-09-30 DIAGNOSIS — I120 Hypertensive chronic kidney disease with stage 5 chronic kidney disease or end stage renal disease: Secondary | ICD-10-CM | POA: Insufficient documentation

## 2016-09-30 DIAGNOSIS — E1151 Type 2 diabetes mellitus with diabetic peripheral angiopathy without gangrene: Secondary | ICD-10-CM | POA: Diagnosis not present

## 2016-09-30 DIAGNOSIS — Z79899 Other long term (current) drug therapy: Secondary | ICD-10-CM | POA: Diagnosis not present

## 2016-09-30 DIAGNOSIS — Z923 Personal history of irradiation: Secondary | ICD-10-CM | POA: Insufficient documentation

## 2016-09-30 DIAGNOSIS — Z992 Dependence on renal dialysis: Secondary | ICD-10-CM | POA: Diagnosis not present

## 2016-09-30 DIAGNOSIS — Z7982 Long term (current) use of aspirin: Secondary | ICD-10-CM | POA: Insufficient documentation

## 2016-09-30 DIAGNOSIS — E1122 Type 2 diabetes mellitus with diabetic chronic kidney disease: Secondary | ICD-10-CM | POA: Diagnosis not present

## 2016-09-30 DIAGNOSIS — N186 End stage renal disease: Secondary | ICD-10-CM | POA: Diagnosis not present

## 2016-09-30 DIAGNOSIS — Z8542 Personal history of malignant neoplasm of other parts of uterus: Secondary | ICD-10-CM | POA: Insufficient documentation

## 2016-10-07 ENCOUNTER — Encounter (HOSPITAL_BASED_OUTPATIENT_CLINIC_OR_DEPARTMENT_OTHER): Payer: Medicare HMO | Attending: Surgery

## 2016-10-07 DIAGNOSIS — J449 Chronic obstructive pulmonary disease, unspecified: Secondary | ICD-10-CM | POA: Diagnosis not present

## 2016-10-07 DIAGNOSIS — Z992 Dependence on renal dialysis: Secondary | ICD-10-CM | POA: Insufficient documentation

## 2016-10-07 DIAGNOSIS — Z66 Do not resuscitate: Secondary | ICD-10-CM | POA: Insufficient documentation

## 2016-10-07 DIAGNOSIS — L89614 Pressure ulcer of right heel, stage 4: Secondary | ICD-10-CM | POA: Diagnosis not present

## 2016-10-07 DIAGNOSIS — N186 End stage renal disease: Secondary | ICD-10-CM | POA: Diagnosis not present

## 2016-10-07 DIAGNOSIS — Z8542 Personal history of malignant neoplasm of other parts of uterus: Secondary | ICD-10-CM | POA: Diagnosis not present

## 2016-10-07 DIAGNOSIS — I120 Hypertensive chronic kidney disease with stage 5 chronic kidney disease or end stage renal disease: Secondary | ICD-10-CM | POA: Insufficient documentation

## 2016-10-07 DIAGNOSIS — Z923 Personal history of irradiation: Secondary | ICD-10-CM | POA: Insufficient documentation

## 2016-10-07 DIAGNOSIS — E1151 Type 2 diabetes mellitus with diabetic peripheral angiopathy without gangrene: Secondary | ICD-10-CM | POA: Insufficient documentation

## 2016-10-07 DIAGNOSIS — E1122 Type 2 diabetes mellitus with diabetic chronic kidney disease: Secondary | ICD-10-CM | POA: Insufficient documentation

## 2016-10-14 DIAGNOSIS — L89614 Pressure ulcer of right heel, stage 4: Secondary | ICD-10-CM | POA: Diagnosis not present

## 2016-10-21 DIAGNOSIS — L89614 Pressure ulcer of right heel, stage 4: Secondary | ICD-10-CM | POA: Diagnosis not present

## 2016-10-28 ENCOUNTER — Other Ambulatory Visit: Payer: Self-pay | Admitting: Certified Registered Nurse Anesthetist

## 2016-10-28 DIAGNOSIS — L89614 Pressure ulcer of right heel, stage 4: Secondary | ICD-10-CM | POA: Diagnosis not present

## 2016-11-04 DIAGNOSIS — L89614 Pressure ulcer of right heel, stage 4: Secondary | ICD-10-CM | POA: Diagnosis not present

## 2016-11-11 ENCOUNTER — Other Ambulatory Visit: Payer: Self-pay | Admitting: Family Medicine

## 2016-11-11 ENCOUNTER — Encounter (HOSPITAL_BASED_OUTPATIENT_CLINIC_OR_DEPARTMENT_OTHER): Payer: Medicare HMO | Attending: Surgery

## 2016-11-11 DIAGNOSIS — E1151 Type 2 diabetes mellitus with diabetic peripheral angiopathy without gangrene: Secondary | ICD-10-CM | POA: Insufficient documentation

## 2016-11-11 DIAGNOSIS — Z992 Dependence on renal dialysis: Secondary | ICD-10-CM | POA: Insufficient documentation

## 2016-11-11 DIAGNOSIS — L89614 Pressure ulcer of right heel, stage 4: Secondary | ICD-10-CM | POA: Insufficient documentation

## 2016-11-11 DIAGNOSIS — Z923 Personal history of irradiation: Secondary | ICD-10-CM | POA: Insufficient documentation

## 2016-11-11 DIAGNOSIS — Z66 Do not resuscitate: Secondary | ICD-10-CM | POA: Insufficient documentation

## 2016-11-11 DIAGNOSIS — R413 Other amnesia: Secondary | ICD-10-CM

## 2016-11-11 DIAGNOSIS — Z8542 Personal history of malignant neoplasm of other parts of uterus: Secondary | ICD-10-CM | POA: Insufficient documentation

## 2016-11-13 ENCOUNTER — Observation Stay (HOSPITAL_COMMUNITY): Payer: Medicare Other

## 2016-11-13 ENCOUNTER — Encounter (HOSPITAL_COMMUNITY): Payer: Self-pay | Admitting: Emergency Medicine

## 2016-11-13 ENCOUNTER — Inpatient Hospital Stay (HOSPITAL_COMMUNITY)
Admission: EM | Admit: 2016-11-13 | Discharge: 2016-11-15 | DRG: 393 | Disposition: A | Discharge status: Home / Self Care | Payer: Medicare Other | Attending: Internal Medicine | Admitting: Internal Medicine

## 2016-11-13 DIAGNOSIS — J449 Chronic obstructive pulmonary disease, unspecified: Secondary | ICD-10-CM | POA: Diagnosis present

## 2016-11-13 DIAGNOSIS — M869 Osteomyelitis, unspecified: Secondary | ICD-10-CM | POA: Diagnosis present

## 2016-11-13 DIAGNOSIS — Z8349 Family history of other endocrine, nutritional and metabolic diseases: Secondary | ICD-10-CM

## 2016-11-13 DIAGNOSIS — Z885 Allergy status to narcotic agent status: Secondary | ICD-10-CM

## 2016-11-13 DIAGNOSIS — E1169 Type 2 diabetes mellitus with other specified complication: Secondary | ICD-10-CM | POA: Diagnosis present

## 2016-11-13 DIAGNOSIS — F0390 Unspecified dementia without behavioral disturbance: Secondary | ICD-10-CM | POA: Diagnosis not present

## 2016-11-13 DIAGNOSIS — I5030 Unspecified diastolic (congestive) heart failure: Secondary | ICD-10-CM | POA: Diagnosis present

## 2016-11-13 DIAGNOSIS — E876 Hypokalemia: Secondary | ICD-10-CM | POA: Diagnosis present

## 2016-11-13 DIAGNOSIS — Z992 Dependence on renal dialysis: Secondary | ICD-10-CM | POA: Diagnosis not present

## 2016-11-13 DIAGNOSIS — R109 Unspecified abdominal pain: Secondary | ICD-10-CM

## 2016-11-13 DIAGNOSIS — E1122 Type 2 diabetes mellitus with diabetic chronic kidney disease: Secondary | ICD-10-CM | POA: Diagnosis present

## 2016-11-13 DIAGNOSIS — E1151 Type 2 diabetes mellitus with diabetic peripheral angiopathy without gangrene: Secondary | ICD-10-CM | POA: Diagnosis not present

## 2016-11-13 DIAGNOSIS — Z66 Do not resuscitate: Secondary | ICD-10-CM | POA: Diagnosis present

## 2016-11-13 DIAGNOSIS — N186 End stage renal disease: Secondary | ICD-10-CM | POA: Diagnosis present

## 2016-11-13 DIAGNOSIS — Z7982 Long term (current) use of aspirin: Secondary | ICD-10-CM | POA: Diagnosis not present

## 2016-11-13 DIAGNOSIS — Z8542 Personal history of malignant neoplasm of other parts of uterus: Secondary | ICD-10-CM | POA: Diagnosis not present

## 2016-11-13 DIAGNOSIS — E118 Type 2 diabetes mellitus with unspecified complications: Secondary | ICD-10-CM | POA: Diagnosis not present

## 2016-11-13 DIAGNOSIS — M898X9 Other specified disorders of bone, unspecified site: Secondary | ICD-10-CM | POA: Diagnosis present

## 2016-11-13 DIAGNOSIS — J90 Pleural effusion, not elsewhere classified: Secondary | ICD-10-CM

## 2016-11-13 DIAGNOSIS — I9589 Other hypotension: Secondary | ICD-10-CM | POA: Diagnosis present

## 2016-11-13 DIAGNOSIS — I4891 Unspecified atrial fibrillation: Secondary | ICD-10-CM | POA: Diagnosis not present

## 2016-11-13 DIAGNOSIS — N2581 Secondary hyperparathyroidism of renal origin: Secondary | ICD-10-CM | POA: Diagnosis present

## 2016-11-13 DIAGNOSIS — E1136 Type 2 diabetes mellitus with diabetic cataract: Secondary | ICD-10-CM | POA: Diagnosis not present

## 2016-11-13 DIAGNOSIS — Z809 Family history of malignant neoplasm, unspecified: Secondary | ICD-10-CM

## 2016-11-13 DIAGNOSIS — L89610 Pressure ulcer of right heel, unstageable: Secondary | ICD-10-CM | POA: Diagnosis present

## 2016-11-13 DIAGNOSIS — I5032 Chronic diastolic (congestive) heart failure: Secondary | ICD-10-CM | POA: Diagnosis not present

## 2016-11-13 DIAGNOSIS — L97411 Non-pressure chronic ulcer of right heel and midfoot limited to breakdown of skin: Secondary | ICD-10-CM

## 2016-11-13 DIAGNOSIS — Z8249 Family history of ischemic heart disease and other diseases of the circulatory system: Secondary | ICD-10-CM

## 2016-11-13 DIAGNOSIS — I959 Hypotension, unspecified: Secondary | ICD-10-CM

## 2016-11-13 DIAGNOSIS — I132 Hypertensive heart and chronic kidney disease with heart failure and with stage 5 chronic kidney disease, or end stage renal disease: Secondary | ICD-10-CM | POA: Diagnosis not present

## 2016-11-13 DIAGNOSIS — I5042 Chronic combined systolic (congestive) and diastolic (congestive) heart failure: Secondary | ICD-10-CM | POA: Diagnosis present

## 2016-11-13 DIAGNOSIS — D631 Anemia in chronic kidney disease: Secondary | ICD-10-CM | POA: Diagnosis present

## 2016-11-13 DIAGNOSIS — Z9889 Other specified postprocedural states: Secondary | ICD-10-CM

## 2016-11-13 DIAGNOSIS — K648 Other hemorrhoids: Principal | ICD-10-CM | POA: Diagnosis present

## 2016-11-13 DIAGNOSIS — M86271 Subacute osteomyelitis, right ankle and foot: Secondary | ICD-10-CM | POA: Diagnosis not present

## 2016-11-13 DIAGNOSIS — Z923 Personal history of irradiation: Secondary | ICD-10-CM | POA: Diagnosis not present

## 2016-11-13 DIAGNOSIS — K625 Hemorrhage of anus and rectum: Secondary | ICD-10-CM | POA: Diagnosis not present

## 2016-11-13 DIAGNOSIS — Z823 Family history of stroke: Secondary | ICD-10-CM

## 2016-11-13 LAB — COMPREHENSIVE METABOLIC PANEL
ALT: 10 U/L — ABNORMAL LOW (ref 14–54)
ANION GAP: 11 (ref 5–15)
AST: 20 U/L (ref 15–41)
Albumin: 3 g/dL — ABNORMAL LOW (ref 3.5–5.0)
Alkaline Phosphatase: 82 U/L (ref 38–126)
BILIRUBIN TOTAL: 0.9 mg/dL (ref 0.3–1.2)
BUN: 11 mg/dL (ref 6–20)
CALCIUM: 8.8 mg/dL — AB (ref 8.9–10.3)
CO2: 31 mmol/L (ref 22–32)
Chloride: 95 mmol/L — ABNORMAL LOW (ref 101–111)
Creatinine, Ser: 2.72 mg/dL — ABNORMAL HIGH (ref 0.44–1.00)
GFR, EST AFRICAN AMERICAN: 17 mL/min — AB (ref >60)
GFR, EST NON AFRICAN AMERICAN: 15 mL/min — AB (ref >60)
Glucose, Bld: 155 mg/dL — ABNORMAL HIGH (ref 65–99)
POTASSIUM: 2.9 mmol/L — AB (ref 3.5–5.1)
Sodium: 137 mmol/L (ref 135–145)
TOTAL PROTEIN: 6.2 g/dL — AB (ref 6.5–8.1)

## 2016-11-13 LAB — CBC
HEMATOCRIT: 33.5 % — AB (ref 36.0–46.0)
HEMOGLOBIN: 10.4 g/dL — AB (ref 12.0–15.0)
MCH: 30.9 pg (ref 26.0–34.0)
MCHC: 31 g/dL (ref 30.0–36.0)
MCV: 99.4 fL (ref 78.0–100.0)
Platelets: 263 10*3/uL (ref 150–400)
RBC: 3.37 MIL/uL — ABNORMAL LOW (ref 3.87–5.11)
RDW: 16.5 % — ABNORMAL HIGH (ref 11.5–15.5)
WBC: 8.1 10*3/uL (ref 4.0–10.5)

## 2016-11-13 LAB — TYPE AND SCREEN
ABO/RH(D): O POS
ANTIBODY SCREEN: NEGATIVE

## 2016-11-13 LAB — POC OCCULT BLOOD, ED: Fecal Occult Bld: POSITIVE — AB

## 2016-11-13 MED ORDER — IOPAMIDOL (ISOVUE-300) INJECTION 61%
INTRAVENOUS | Status: AC
  Filled 2016-11-13: qty 30

## 2016-11-13 MED ORDER — NEPHRO-VITE 0.8 MG PO TABS
1.0000 | ORAL_TABLET | Freq: Every day | ORAL | Status: DC
  Filled 2016-11-13: qty 1

## 2016-11-13 MED ORDER — DONEPEZIL HCL 5 MG PO TABS
5.0000 mg | ORAL_TABLET | Freq: Every day | ORAL | Status: DC
  Administered 2016-11-13 – 2016-11-14 (×2): 5 mg via ORAL
  Filled 2016-11-13 (×2): qty 1

## 2016-11-13 MED ORDER — ATORVASTATIN CALCIUM 20 MG PO TABS
20.0000 mg | ORAL_TABLET | Freq: Every day | ORAL | Status: DC
  Administered 2016-11-13 – 2016-11-15 (×3): 20 mg via ORAL
  Filled 2016-11-13 (×3): qty 1

## 2016-11-13 MED ORDER — ACETAMINOPHEN 650 MG RE SUPP: 650.0000 mg | Freq: Four times a day (QID) | RECTAL | Status: DC | PRN

## 2016-11-13 MED ORDER — ADULT MULTIVITAMIN W/MINERALS CH: 1.0000 | ORAL_TABLET | Freq: Every day | ORAL | Status: DC

## 2016-11-13 MED ORDER — ACETAMINOPHEN 325 MG PO TABS
650.0000 mg | ORAL_TABLET | Freq: Four times a day (QID) | ORAL | Status: DC | PRN
  Filled 2016-11-13: qty 2

## 2016-11-13 MED ORDER — SODIUM CHLORIDE 0.9 % IV SOLN
INTRAVENOUS | Status: DC
Start: 2016-11-13 — End: 2016-11-15

## 2016-11-13 MED ORDER — SALINE SPRAY 0.65 % NA SOLN
1.0000 | NASAL | Status: DC | PRN
  Filled 2016-11-13: qty 44

## 2016-11-13 MED ORDER — RENA-VITE PO TABS
1.0000 | ORAL_TABLET | Freq: Every day | ORAL | Status: DC
  Administered 2016-11-13 – 2016-11-14 (×2): 1 via ORAL
  Filled 2016-11-13 (×2): qty 1

## 2016-11-13 MED ORDER — ALBUTEROL SULFATE (2.5 MG/3ML) 0.083% IN NEBU: 2.5000 mg | INHALATION_SOLUTION | RESPIRATORY_TRACT | Status: DC | PRN

## 2016-11-13 MED ORDER — POTASSIUM CHLORIDE CRYS ER 20 MEQ PO TBCR
40.0000 meq | EXTENDED_RELEASE_TABLET | Freq: Once | ORAL | Status: AC
  Administered 2016-11-13: 40 meq via ORAL
  Filled 2016-11-13: qty 2

## 2016-11-13 NOTE — ED Notes (Signed)
Tried contacting social work x 3 with no response.  They wanted to be contacted when son came back.

## 2016-11-13 NOTE — ED Notes (Addendum)
EDP notified of patient's low BP. Patient on dialysis.  Last dialysis was yesterday for 4 hours. Not discharging patient until EDP says it is ok.

## 2016-11-13 NOTE — ED Triage Notes (Signed)
Pt in from home via [facility] with c/o bloody stools x 2 wks. Pt not on blood thinners, denies abdominal pain, n/v or diarrhea. Per EMS, bleeding is worse as of this morning. Son states he saw coffee ground stool x2 in toilet. Pt does dialysis MWF, last done yesterday. Alert, hx of dementia, DM, has wound to R posterior heel with drainage clear/white drainage noted

## 2016-11-13 NOTE — Discharge Planning (Signed)
[facility] consulted regarding [place] services for RN, PT and SW.  Son has contacted [facility] regarding SNF placement for mom as he and siblings are unable to properly care for mom in home.  Pt has had private duty care in past but is no longer able to afford.  EDSW working on placement for SNF, but pt son decided to have mom placed from home, electing for [facility] services.    EDCM and EDSW met with pt at bedside regarding [facility] agency.  Pt not comfortable with making decision, would like to wait until son returns.  Bedside RN will contact EDSW when son returns to complete [facility] set up.

## 2016-11-13 NOTE — Care Management Obs Status (Signed)
MEDICARE OBSERVATION STATUS NOTIFICATION   Patient Details  Name: Debbie [name] MRN: [id number] Date of Birth: [birth year]   Medicare Observation Status Notification Given:  Ernesta Amble, RN 11/13/2016, 9:53 PM

## 2016-11-13 NOTE — ED Notes (Signed)
Unsuccessful at giving report to 6E.

## 2016-11-13 NOTE — H&P (Addendum)
TRH H&P   Patient Demographics:    Debbie [name], is a 83 y.o. female  MRN: [id number]   DOB - Nov 17, 1930  Admit Date - 11/13/2016  Outpatient Primary MD for the patient is [name], MD  Referring MD/NP/PA: Dr Wilhemena Durie  Patient coming from: Home  Chief Complaint  Patient presents with  . Rectal Bleeding      HPI:    Debbie [name]  is a 82 y.o. female, With past medical history of COPD, diastolic CHF, diabetes, end-stage renal disease on hemodialysis Monday Wednesday Friday, started last February, presents from home with multiple complaints, generalized weakness, and bright red blood per rectum, and abdominal cramps, patient reports symptoms for last 2 weeks, only small amount of blood in stool, intermittent, and reports abdominal cramps, occasional, unrelated to eating, she denies any nausea, vomiting, melena, or vaginal bleeding, plan was to discharge patient from ED  as her hemoglobin was stable,And it was felt to be hemorrhoidal bleed, but then she started to have low blood pressure, with systolic in the 33P or 82P, where she has received 200 mL fluid bolus, where her hypotension has improved. As well patient had recent right heel pressure ulcer, where she had biopsy done, son was told it is infected, but she has not been started on any antibiotics.    Review of systems:    In addition to the HPI above,  No Fever-chills, No Headache, No changes with Vision or hearing, No problems swallowing food or Liquids, No Chest pain, Cough or Shortness of Breath, And planes of abdominal cramps, No Nausea or Vommitting, Bowel movements are regular, No blood in urine, but small amount of blood in stools No dysuria, No new skin rashes or bruises, No new joints pains-aches,  No new weakness, tingling, numbness in any extremity, report generalized weakness No recent weight gain or  loss, No polyuria, polydypsia or polyphagia, No significant Mental Stressors.  A full 10 point Review of Systems was done, except as stated above, all other Review of Systems were negative.   With Past History of the following :    Past Medical History:  Diagnosis Date  . Allergy   . Anxiety   . Arthritis   . Cancer ([place]) dx'd 12/2007   endometroid adenocarcinoma  . Cataract    both  . Diabetes mellitus    fasting [id number]  . Dizziness   . History of radiation therapy 9/21,9/28,10/05,05/20/2008   4 txs 2400 cGy endometrial adenocarcinoma  . Hypertension   . Peripheral vascular disease ([place])   . Seasonal allergies   . Secondary malignant neoplasm of vagina ([place])   . UTI (urinary tract infection) 05/2016      Past Surgical History:  Procedure Laterality Date  . ABDOMINAL HYSTERECTOMY    . APPENDECTOMY    . AV FISTULA PLACEMENT Left 07/17/2016   Procedure: LEFT ARM ARTERIOVENOUS (AV) FISTULA CREATION;  Surgeon: Angelia Mould, MD;  Location: [place];  Service: Vascular;  Laterality: Left;  . ENDARTERECTOMY Left 01/12/2014   Procedure: LEFT CAROTID ARTERY ENDARTERECTOMY WITH HEMASHIELD PATCH ANGIOPLASTY;  Surgeon: Mal Misty, MD;  Location: [place];  Service: Vascular;  Laterality: Left;  . ENDARTERECTOMY Right 03/17/2014   Procedure: RIGHT CAROTD ARTERY ENDARTERECTOMY WITH DACRON PATCH ANGIOPLASTY;  Surgeon: Mal Misty, MD;  Location: [place];  Service: Vascular;  Laterality: Right;  . EYE SURGERY Bilateral    cataracts  . INSERTION OF DIALYSIS CATHETER Right 07/17/2016   Procedure: INSERTION OF DIALYSIS CATHETER;  Surgeon: Angelia Mould, MD;  Location: [place];  Service: Vascular;  Laterality: Right;  . NASAL SEPTUM SURGERY    . NECK SURGERY Bilateral    arterial  . TONSILLECTOMY        Social History:     Social History  Substance Use Topics  . Smoking status: Never Smoker  . Smokeless tobacco: Never Used  . Alcohol use No     Lives -  At  home  Mobility - with assistance     Family History :     Family History  Problem Relation Age of Onset  . Actinic keratosis Mother   . Cancer Mother        uterine  . Heart disease Father   . Hyperlipidemia Father   . Hypertension Father   . Stroke Father   . Cancer Sister        lung      Home Medications:   Prior to Admission medications   Medication Sig Start Date End Date Taking? Authorizing Provider  Albuterol Sulfate (PROAIR RESPICLICK) 924 (90 Base) MCG/ACT AEPB Inhale 1-2 puffs into the lungs every 4 (four) hours as needed. 06/02/16  Yes Patrecia Pour, Christean Grief, MD  aspirin EC 81 MG tablet Take 81 mg by mouth daily.   Yes [provider]  atorvastatin (LIPITOR) 20 MG tablet Take 20 mg by mouth daily. 05/26/16  Yes [provider]  B Complex-C-Folic Acid (NEPHRO-VITE PO) Take by mouth.   Yes [provider]  donepezil (ARICEPT) 5 MG tablet Take 5 mg by mouth at bedtime.   Yes [provider]  guaiFENesin (MUCINEX) 600 MG 12 hr tablet Take 1 tablet (600 mg total) by mouth 2 (two) times daily as needed. Patient taking differently: Take 600 mg by mouth 2 (two) times daily.  07/19/16  Yes Bonnielee Haff, MD  Multiple Vitamin (MULTIVITAMIN) tablet Take 1 tablet by mouth daily. Centrum silver   Yes [provider]  sodium chloride (OCEAN) 0.65 % SOLN nasal spray Place 1 spray into both nostrils as needed for congestion.   Yes [provider]     Allergies:     Allergies  Allergen Reactions  . Codeine Other (See Comments)    Unusual mouth sensation     Physical Exam:   Vitals  Blood pressure (!) 78/61, pulse 70, temperature 98.1 F (36.7 C), temperature source Oral, resp. rate 20, height 5\' 4"  (1.626 m), weight 72.1 kg (159 lb), SpO2 97 %.   1. General Frail elderly female lying in bed in NAD,    2. Normal affect and insight, Not Suicidal or Homicidal, Awake Alert, Oriented X 3.  3. No F.N deficits, ALL  C.Nerves Intact, Strength 5/5 all 4 extremities, Sensation intact all 4 extremities, Plantars down going.  4. Ears and Eyes appear Normal, Conjunctivae clear, PERRLA. Moist Oral Mucosa.  5. Supple Neck,  No JVD, No cervical lymphadenopathy appriciated, No Carotid Bruits.  6. Symmetrical Chest wall movement, Good air movement bilaterally, CTAB.  7. Irregular irregular, No Gallops, Rubs or Murmurs, No Parasternal Heave.  8. Positive Bowel Sounds, Abdomen Soft, No tenderness, No organomegaly appriciated,No rebound -guarding or rigidity.  9.  No Cyanosis, Normal Skin Turgor, small area of right heel pressure ulcer, nontoxic-appearing.  10. Good muscle tone,  joints appear normal , no effusions, Normal ROM.  11. No Palpable Lymph Nodes in Neck or Axillae     Data Review:    CBC  Recent Labs Lab 11/13/16 1055  WBC 8.1  HGB 10.4*  HCT 33.5*  PLT 263  MCV 99.4  MCH 30.9  MCHC 31.0  RDW 16.5*   ------------------------------------------------------------------------------------------------------------------  Chemistries   Recent Labs Lab 11/13/16 1055  NA 137  K 2.9*  CL 95*  CO2 31  GLUCOSE 155*  BUN 11  CREATININE 2.72*  CALCIUM 8.8*  AST 20  ALT 10*  ALKPHOS 82  BILITOT 0.9   ------------------------------------------------------------------------------------------------------------------ estimated creatinine clearance is 15 mL/min (A) (by C-G formula based on SCr of 2.72 mg/dL (H)). ------------------------------------------------------------------------------------------------------------------ No results for input(s): TSH, T4TOTAL, T3FREE, THYROIDAB in the last 72 hours.  Invalid input(s): FREET3  Coagulation profile No results for input(s): INR, PROTIME in the last 168 hours. ------------------------------------------------------------------------------------------------------------------- No results for input(s): DDIMER in the last 72  hours. -------------------------------------------------------------------------------------------------------------------  Cardiac Enzymes No results for input(s): CKMB, TROPONINI, MYOGLOBIN in the last 168 hours.  Invalid input(s): CK ------------------------------------------------------------------------------------------------------------------    Component Value Date/Time   BNP 690.8 (H) 06/08/2016 0751     ---------------------------------------------------------------------------------------------------------------  Urinalysis    Component Value Date/Time   COLORURINE YELLOW 06/26/2016 0028   APPEARANCEUR CLOUDY (A) 06/26/2016 0028   LABSPEC 1.013 06/26/2016 0028   PHURINE 5.0 06/26/2016 0028   GLUCOSEU 50 (A) 06/26/2016 0028   HGBUR NEGATIVE 06/26/2016 0028   BILIRUBINUR NEGATIVE 06/26/2016 0028   KETONESUR NEGATIVE 06/26/2016 0028   PROTEINUR 100 (A) 06/26/2016 0028   UROBILINOGEN 1.0 03/07/2014 1510   NITRITE NEGATIVE 06/26/2016 0028   LEUKOCYTESUR TRACE (A) 06/26/2016 0028    ----------------------------------------------------------------------------------------------------------------   Imaging Results:    No results found.    Assessment & Plan:    Active Problems:   Diabetes mellitus with complication (HCC)   Diastolic heart failure (HCC)   Hypotension   Bright red blood per rectum  Hypotension - She is asymptomatic, this is most likely related to volume depletion, as she does look dry, will obtain blood culture, she received 200 mL fluid bolus in ED, blood pressure responded nicely, will give 200 mL over the next 4 hours, she still making urine, will discuss with renal in a.m. if her dry weight needs to be adjusted.  Rectal bleeding - This is most likely related to internal hemorrhoid bleed, discussed with ED physician who examined the patient, normal color stool, but small amount of fresh blood from hemorrhoids, Myoglobin is stable at baseline,  will monitor closely, will repeat CBC in a.m., will keep holding aspirin overnight .  Abdominal cramp  - Minimal, not significant, but given she was hypotensive, she is dialysis, there is low suspicion for ischemic colitis, pattern of pain is not related to eating , so will obtain CT abdomen pelvis with oral contrast for further evaluation , will avoid IV contrast as she still making urine .  Hypokalemia - Repleted by ED physician  Right heel pressure ulcer - Wound care consulted, she had biopsy done  on 5/22 that point, I don't see any results, will discuss with her wound care physician tomorrow to see if she has a cellulitis or not to make decision about antibiotics.  ESRD - We'll consult renal in a.m. to resume dialysis as she is due tomorrow  A. Fib - Currently rate controlled, appears to be in the history, she is on aspirin, which were holding right now, certainly she is not an anticoagulation candidate currently with her bright red blood per rectum.  Chronic diastolic CHF - Appears to be euvolemic, actually slightly on the dry side, volume management with dialysis  Diabetes mellitus - Seems to be diet controlled, denies any hypoglycemic agents at home, will monitor CBG closely.  Generalized weakness - Seen by PT in ED, will need SNF placement, social worker consult placed   DVT ProphylaxisSCDs  AM Labs Ordered, also please review Full Orders  Family Communication: Admission, patients condition and plan of care including tests being ordered have been discussed with the patient and son and grandson  who indicate understanding and agree with the plan and Code Status.  Code Status DO NOT RESUSCITATE, confirmed by patient, son and grandson at bedside  Likely DC to  SNF   Condition GUARDED    Consults called: none, Renal need to be called in a.m.  Admission status: Observation  Time spent in minutes : 70 minutes  Joshua Soulier M.D on 11/13/2016 at 5:45 PM  Between 7am  to 7pm - Pager - [phone]. After 7pm go to [email or web address] - password [facility]  [facility] - Office  [phone]

## 2016-11-13 NOTE — Clinical Social Work Note (Addendum)
CSW consulted for SNF placement. Pt will need PT eval in order to initiate insurance authorization. CSW will contact PT.   CSW spoke with pt's son. Per pt's son he has been in contact with [facility] about placement for his mom. Pt's son agreeable to further fax out to [place] facilities. Pt's son explained difficulty of finding placement that will care for pt's wound, chest port, and provide transport to dialysis three days a week. CSW explained process of HH services w/ SW who can do placement for pt from home. Pt's son requesting application for Medicaid--CSW provided. SW spoke with [facility] and MD and [facility] w/ social worker will be arranged. Pt will d/c today. No further CSW needs at this time.   [place], [place]

## 2016-11-13 NOTE — Evaluation (Signed)
Physical Therapy Evaluation Patient Details Name: Debbie [name] MRN: [id number] DOB: October 02, 1930 Today's Date: 11/13/2016   History of Present Illness  Pt is 83 y/o female presenting to the ED secondary to rectal bleeding. PMH includes dementia, Renal failure on HD MWF, HTN, PVD, cataracts, R posterior heel ulcer, COPD, DM and L AV fistula placement.   Clinical Impression  Pt presented to ED with problem above and deficits below. PTA, pt was needing assist with transfers, mobility, and ADLs. Has history of dementia. Upon evaluation, pt not comfortable with mobility with HHA, however, did attempt bed mobility and standing. Pt requiring min A for basic mobility. Pt's son reporting pt will go home and then go to SNF. Feel pt would benefit from SNF at this time. If pt goes home, will need HHPT and 24/7 assist. Will continue to follow acutely.     Follow Up Recommendations SNF    Equipment Recommendations  None recommended by PT    Recommendations for Other Services       Precautions / Restrictions Precautions Precautions: Fall Restrictions Weight Bearing Restrictions: No Other Position/Activity Restrictions: R heel ulcer       Mobility  Bed Mobility Overal bed mobility: Needs Assistance Bed Mobility: Supine to Sit;Sit to Supine     Supine to sit: Min assist Sit to supine: Min assist   General bed mobility comments: Min A for trunk elevation and LE managment. Use of bed rails.   Transfers Overall transfer level: Needs assistance Equipment used: 1 person hand held assist Transfers: Sit to/from Stand Sit to Stand: Min assist         General transfer comment: Min A to stand with HHA, however, pt holding onto bed. No RW available therefore pt very fearful of further mobility.   Ambulation/Gait             General Gait Details: Deferred secondary to fear of mobility without use of RW  Stairs            Wheelchair Mobility    Modified Rankin (Stroke Patients  Only)       Balance Overall balance assessment: Needs assistance Sitting-balance support: No upper extremity supported;Feet supported Sitting balance-Leahy Scale: Fair     Standing balance support: Single extremity supported Standing balance-Leahy Scale: Poor Standing balance comment: Reliant on assist for standing balance                             Pertinent Vitals/Pain Pain Assessment: No/denies pain    Home Living Family/patient expects to be discharged to:: Private residence Living Arrangements: Children Available Help at Discharge: Family;Available PRN/intermittently Type of Home: House Home Access: Ramped entrance     Home Layout: One level Home Equipment: Walker - 2 wheels Additional Comments: Pt son reporting they are planning to take pt home with [facility] services and set up SNF placement from home.     Prior Function Level of Independence: Needs assistance   Gait / Transfers Assistance Needed: Pt's son reports pt ambulated with RW at home, however, required assist to stand and get out of bed.   ADL's / Homemaking Assistance Needed: pt's son reported she needed assistance with bathing and dressing        Hand Dominance   Dominant Hand: Right    Extremity/Trunk Assessment   Upper Extremity Assessment Upper Extremity Assessment: Generalized weakness    Lower Extremity Assessment Lower Extremity Assessment: Generalized weakness    Cervical /  Trunk Assessment Cervical / Trunk Assessment: Kyphotic  Communication   Communication: No difficulties  Cognition Arousal/Alertness: Awake/alert Behavior During Therapy: WFL for tasks assessed/performed Overall Cognitive Status: History of cognitive impairments - at baseline                                 General Comments: History of dementia at baseline      General Comments General comments (skin integrity, edema, etc.): Pt's son present for part of session.     Exercises      Assessment/Plan    PT Assessment Patient needs continued PT services  PT Problem List Decreased strength;Decreased balance;Decreased mobility;Decreased knowledge of use of DME;Decreased safety awareness;Decreased knowledge of precautions       PT Treatment Interventions DME instruction;Gait training;Functional mobility training;Therapeutic activities;Therapeutic exercise;Neuromuscular re-education;Balance training;Cognitive remediation;Patient/family education    PT Goals (Current goals can be found in the Care Plan section)  Acute Rehab PT Goals Patient Stated Goal: none stated PT Goal Formulation: With patient Time For Goal Achievement: 11/20/16 Potential to Achieve Goals: Fair    Frequency Min 3X/week   Barriers to discharge Decreased caregiver support family only available PRN     Co-evaluation               AM-PAC PT "6 Clicks" Daily Activity  Outcome Measure Difficulty turning over in bed (including adjusting bedclothes, sheets and blankets)?: A Lot Difficulty moving from lying on back to sitting on the side of the bed? : Total Difficulty sitting down on and standing up from a chair with arms (e.g., wheelchair, bedside commode, etc,.)?: Total Help needed moving to and from a bed to chair (including a wheelchair)?: A Lot Help needed walking in hospital room?: A Lot Help needed climbing 3-5 steps with a railing? : Total 6 Click Score: 9    End of Session Equipment Utilized During Treatment: Gait belt Activity Tolerance: Patient tolerated treatment well Patient left: in bed;with call bell/phone within reach Nurse Communication: Mobility status PT Visit Diagnosis: Unsteadiness on feet (R26.81)    Time: 2458-0998 PT Time Calculation (min) (ACUTE ONLY): 23 min   Charges:   PT Evaluation $PT Eval Low Complexity: 1 Procedure     PT G Codes:   PT G-Codes **NOT FOR INPATIENT CLASS** Functional Assessment Tool Used: AM-PAC 6 Clicks Basic Mobility Functional  Limitation: Mobility: Walking and moving around Mobility: Walking and Moving Around Current Status (P3825): At least 60 percent but less than 80 percent impaired, limited or restricted Mobility: Walking and Moving Around Goal Status [phone]): At least 1 percent but less than 20 percent impaired, limited or restricted    Nicky Pugh, PT, DPT  Acute Rehabilitation Services  Pager: [place] 11/13/2016, 4:23 PM

## 2016-11-13 NOTE — ED Provider Notes (Signed)
[facility] DEPT Provider Note   CSN: [id number] Arrival date & time: 11/13/16  1009     History   Chief Complaint Chief Complaint  Patient presents with  . Rectal Bleeding    HPI Debbie [name] is a 83 y.o. female.  HPI Patient is brought in by her son who reports she has had rectal bleeding. This is been intermittent for about 2 weeks. The still has looked dark at times and sometimes blood-streaked. Patient's son also reports that he has seen mucousy rectal discharge as well more recently. The patient has not had complaints of pain. She does have significant dementia. She is not on any blood thinners.  He also has concerns for a heel wound on the right that is being treated by wound care. He reports he put bandages on it but the bandages, offer the patient removes them. He reports it just doesn't seem to be healing very much. He reports despite being instructed not to do so, she does habitually rest the other foot on top of that foot and create additional pressure on the heel.  Patient has dialysis Monday Wednesday Friday. Was at dialysis yesterday. Tolerated without difficulty.  Past Medical History:  Diagnosis Date  . Allergy   . Anxiety   . Arthritis   . Cancer ([place]) dx'd 12/2007   endometroid adenocarcinoma  . Cataract    both  . Diabetes mellitus    fasting [id number]  . Dizziness   . History of radiation therapy 9/21,9/28,10/05,05/20/2008   4 txs 2400 cGy endometrial adenocarcinoma  . Hypertension   . Peripheral vascular disease ([place])   . Seasonal allergies   . 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 UTI (urinary tract infection) 05/2016    Patient Active Problem List   Diagnosis Date Noted  . Central venous catheter in place   . AKI (acute kidney injury) ([place])   . Hyperkalemia   . Atrial fibrillation ([place])   . Bradycardia   . Renal failure 06/25/2016  . COPD exacerbation ([place]) 06/08/2016  . Diastolic heart failure ([place]) 06/08/2016  .  Acute respiratory failure with hypoxia ([place])   . COPD with hypoxia ([place]) 06/02/2016  . Pressure injury of skin 05/29/2016  . Respiratory distress 05/29/2016  . UTI (urinary tract infection) 05/28/2016  . Hypoglycemia 05/28/2016  . Diabetes ([place]) 05/28/2016  . Anxiety 05/28/2016  . Hypertension 05/28/2016  . Diabetes mellitus with complication ([place])   . Renal insufficiency   . Urinary tract infection without hematuria   . 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 Carotid stenosis, asymptomatic 11/28/2014  . Aftercare following surgery of the circulatory system 04/11/2014  . Aftercare following surgery of the circulatory system, [place] 01/31/2014  . Carotid stenosis 01/10/2014  . Occlusion and stenosis of carotid artery without mention of cerebral infarction 01/10/2014  . Malignant neoplasm of corpus uteri, except isthmus ([place]) 07/07/2011    Past Surgical History:  Procedure Laterality Date  . ABDOMINAL HYSTERECTOMY    . APPENDECTOMY    . AV FISTULA PLACEMENT Left 07/17/2016   Procedure: LEFT ARM ARTERIOVENOUS (AV) FISTULA CREATION;  Surgeon: Angelia Mould, MD;  Location: [facility];  Service: Vascular;  Laterality: Left;  . ENDARTERECTOMY Left 01/12/2014   Procedure: LEFT CAROTID ARTERY ENDARTERECTOMY WITH HEMASHIELD PATCH ANGIOPLASTY;  Surgeon: Mal Misty, MD;  Location: [facility];  Service: Vascular;  Laterality: Left;  . ENDARTERECTOMY Right 03/17/2014   Procedure: RIGHT CAROTD ARTERY ENDARTERECTOMY WITH DACRON PATCH ANGIOPLASTY;  Surgeon: Mal Misty, MD;  Location: [facility];  Service: Vascular;  Laterality: Right;  . EYE SURGERY Bilateral    cataracts  . INSERTION OF DIALYSIS CATHETER Right 07/17/2016   Procedure: INSERTION OF DIALYSIS CATHETER;  Surgeon: Angelia Mould, MD;  Location: [facility];  Service: Vascular;  Laterality: Right;  . NASAL SEPTUM SURGERY    . NECK SURGERY Bilateral    arterial  . TONSILLECTOMY      OB History    No data available        Home Medications    Prior to Admission medications   Medication Sig Start Date End Date Taking? Authorizing Provider  Albuterol Sulfate (PROAIR RESPICLICK) 902 (90 Base) MCG/ACT AEPB Inhale 1-2 puffs into the lungs every 4 (four) hours as needed. 06/02/16  Yes Patrecia Pour, Christean Grief, MD  aspirin EC 81 MG tablet Take 81 mg by mouth daily.   Yes [provider]  atorvastatin (LIPITOR) 20 MG tablet Take 20 mg by mouth daily. 05/26/16  Yes [provider]  B Complex-C-Folic Acid (NEPHRO-VITE PO) Take by mouth.   Yes [provider]  donepezil (ARICEPT) 5 MG tablet Take 5 mg by mouth at bedtime.   Yes [provider]  guaiFENesin (MUCINEX) 600 MG 12 hr tablet Take 1 tablet (600 mg total) by mouth 2 (two) times daily as needed. Patient taking differently: Take 600 mg by mouth 2 (two) times daily.  07/19/16  Yes Bonnielee Haff, MD  Multiple Vitamin (MULTIVITAMIN) tablet Take 1 tablet by mouth daily. Centrum silver   Yes [provider]  sodium chloride (OCEAN) 0.65 % SOLN nasal spray Place 1 spray into both nostrils as needed for congestion.   Yes [provider]    Family History Family History  Problem Relation Age of Onset  . Actinic keratosis Mother   . Cancer Mother        uterine  . Heart disease Father   . Hyperlipidemia Father   . Hypertension Father   . Stroke Father   . Cancer Sister        lung    Social History Social History  Substance Use Topics  . Smoking status: Never Smoker  . Smokeless tobacco: Never Used  . Alcohol use No     Allergies   Codeine   Review of Systems Review of Systems  10 Systems reviewed and are negative for acute change except as noted in the HPI.  Physical Exam Updated Vital Signs BP (!) 122/52   Pulse 89   Temp 98.1 F (36.7 C) (Oral)   Resp 16   Ht 5\' 4"  (1.626 m)   Wt 72.1 kg (159 lb)   SpO2 92%   BMI 27.29 kg/m   Physical Exam  Constitutional:  Patient is alert  and nontoxic. She is cooperative. No respiratory distress.  HENT:  Head: Normocephalic and atraumatic.  Nose: Nose normal.  Mouth/Throat: Oropharynx is clear and moist.  Eyes: EOM are normal.  Cardiovascular: Normal rate, regular rhythm, normal heart sounds and intact distal pulses.   Pulmonary/Chest: Effort normal and breath sounds normal.  Abdominal: Soft. Bowel sounds are normal. She exhibits no distension. There is no tenderness. There is no guarding.  Genitourinary:  Genitourinary Comments: Rectal exam has soft brown stool in the vault with trace of red blood that is streaky. No palpable mass  Musculoskeletal: Normal range of motion. She exhibits no edema or tenderness.  Both the patient's heels have chronic appearing bone spur  calcifications. The right heel has some thickening of the skin with small bits of eschar that appear slightly callused. No diffuse erythema. No wet ulcerations. Appears fairly chronic and stable. No diffuse edema of the foot or lower leg.  Neurological: She is alert. She exhibits normal muscle tone. Coordination normal.  Patient is alert and cooperative. She does however exhibit signs consistent with dementia such as poor recall and easy confusion.  Skin: Skin is warm and dry.  Psychiatric: She has a normal mood and affect.     ED Treatments / Results  Labs (all labs ordered are listed, but only abnormal results are displayed) Labs Reviewed  COMPREHENSIVE METABOLIC PANEL - Abnormal; Notable for the following:       Result Value   Potassium 2.9 (*)    Chloride 95 (*)    Glucose, Bld 155 (*)    Creatinine, Ser 2.72 (*)    Calcium 8.8 (*)    Total Protein 6.2 (*)    Albumin 3.0 (*)    ALT 10 (*)    GFR calc non Af Amer 15 (*)    GFR calc Af Amer 17 (*)    All other components within normal limits  CBC - Abnormal; Notable for the following:    RBC 3.37 (*)    Hemoglobin 10.4 (*)    HCT 33.5 (*)    RDW 16.5 (*)    All other components within normal  limits  POC OCCULT BLOOD, ED - Abnormal; Notable for the following:    Fecal Occult Bld POSITIVE (*)    All other components within normal limits  TYPE AND SCREEN    EKG  EKG Interpretation None       Radiology No results found.  Procedures Procedures (including critical care time)  Medications Ordered in ED Medications  potassium chloride SA (K-DUR,KLOR-CON) CR tablet 40 mEq (not administered)     Initial Impression / Assessment and Plan / ED Course  I have reviewed the triage vital signs and the nursing notes.  Pertinent labs & imaging results that were available during my care of the patient were reviewed by me and considered in my medical decision making (see chart for details).       Final Clinical Impressions(s) / ED Diagnoses   Final diagnoses:  Rectal bleeding  Skin ulcer of right heel, limited to breakdown of skin ([place])  ESRD (end stage renal disease) on dialysis [facility])   Patient will require follow-up colonoscopy. Bleeding appears to be rectal and patient is not having anemia. She is not on any blood thinners. She does have low potassium. Patient will be scheduled for dialysis tomorrow. She is given one oral dose of potassium in the emergency department. Patient's heel wound is chronic in appearance. At this time counseled given for avoiding pressure on the wound. It is dry with small amounts of  eshar  that do not appear to be actively infected. New Prescriptions New Prescriptions   No medications on file     Charlesetta Shanks, MD 11/13/16 1329

## 2016-11-13 NOTE — Care Management Note (Signed)
Case Management Note  Patient Details  Name: Debbie [name] MRN: [id number] Date of Birth: 1930/08/03  Subjective/Objective:     Patient presented to [facility] ED with c/o            Of rectal bleeding for about 2 weeks also concerned about wound on her right heel that does not seem to be healing patient is being treated by wound care. Patient is also on HD M/W/F. Patient lives at home with adult son Debbie [name] [phone]. Family is concerned that they may not be able to meet her transitional care needs at this time and they are considering SNF placement. Daytime ED CM discussed [place] services with patient and family.  Plan was to discharge patient from ED with [facility] services but patient became hypotensive. Hospitalist was consult for observation stay, patient and family was agreeable with plan. Unit CM/CSW will continue to follow for care transitional planning  Action/Plan:   Expected Discharge Date:                  Expected Discharge Plan:  [employer] (vs. SNF )  In-House Referral:  Clinical Social Work  Discharge planning Services  CM Consult  Post Acute Care Choice:    Choice offered to:  Patient  DME Arranged:    DME Agency:     HH Arranged:  RN, PT, OT, Nurse's Aide [place] Agency:  [place]  Status of Service:  In process, will continue to follow  If discussed at Long Length of Stay Meetings, dates discussed:    Additional CommentsLaurena Slimmer, RN 11/13/2016, 10:09 PM

## 2016-11-14 DIAGNOSIS — Z809 Family history of malignant neoplasm, unspecified: Secondary | ICD-10-CM | POA: Diagnosis not present

## 2016-11-14 DIAGNOSIS — N186 End stage renal disease: Secondary | ICD-10-CM | POA: Diagnosis present

## 2016-11-14 DIAGNOSIS — E1151 Type 2 diabetes mellitus with diabetic peripheral angiopathy without gangrene: Secondary | ICD-10-CM | POA: Diagnosis present

## 2016-11-14 DIAGNOSIS — F0390 Unspecified dementia without behavioral disturbance: Secondary | ICD-10-CM | POA: Diagnosis present

## 2016-11-14 DIAGNOSIS — Z8542 Personal history of malignant neoplasm of other parts of uterus: Secondary | ICD-10-CM | POA: Diagnosis not present

## 2016-11-14 DIAGNOSIS — E1136 Type 2 diabetes mellitus with diabetic cataract: Secondary | ICD-10-CM | POA: Diagnosis present

## 2016-11-14 DIAGNOSIS — J449 Chronic obstructive pulmonary disease, unspecified: Secondary | ICD-10-CM | POA: Diagnosis present

## 2016-11-14 DIAGNOSIS — E1122 Type 2 diabetes mellitus with diabetic chronic kidney disease: Secondary | ICD-10-CM | POA: Diagnosis present

## 2016-11-14 DIAGNOSIS — R109 Unspecified abdominal pain: Secondary | ICD-10-CM | POA: Diagnosis present

## 2016-11-14 DIAGNOSIS — Z992 Dependence on renal dialysis: Secondary | ICD-10-CM | POA: Diagnosis not present

## 2016-11-14 DIAGNOSIS — N2581 Secondary hyperparathyroidism of renal origin: Secondary | ICD-10-CM | POA: Diagnosis present

## 2016-11-14 DIAGNOSIS — K625 Hemorrhage of anus and rectum: Secondary | ICD-10-CM | POA: Diagnosis present

## 2016-11-14 DIAGNOSIS — I5042 Chronic combined systolic (congestive) and diastolic (congestive) heart failure: Secondary | ICD-10-CM | POA: Diagnosis present

## 2016-11-14 DIAGNOSIS — M86271 Subacute osteomyelitis, right ankle and foot: Secondary | ICD-10-CM | POA: Diagnosis not present

## 2016-11-14 DIAGNOSIS — Z7982 Long term (current) use of aspirin: Secondary | ICD-10-CM | POA: Diagnosis not present

## 2016-11-14 DIAGNOSIS — K648 Other hemorrhoids: Secondary | ICD-10-CM | POA: Diagnosis present

## 2016-11-14 DIAGNOSIS — M869 Osteomyelitis, unspecified: Secondary | ICD-10-CM | POA: Diagnosis present

## 2016-11-14 DIAGNOSIS — Z923 Personal history of irradiation: Secondary | ICD-10-CM | POA: Diagnosis not present

## 2016-11-14 DIAGNOSIS — I132 Hypertensive heart and chronic kidney disease with heart failure and with stage 5 chronic kidney disease, or end stage renal disease: Secondary | ICD-10-CM | POA: Diagnosis present

## 2016-11-14 DIAGNOSIS — I4891 Unspecified atrial fibrillation: Secondary | ICD-10-CM | POA: Diagnosis present

## 2016-11-14 LAB — BASIC METABOLIC PANEL
ANION GAP: 9 (ref 5–15)
BUN: 17 mg/dL (ref 6–20)
CHLORIDE: 95 mmol/L — AB (ref 101–111)
CO2: 29 mmol/L (ref 22–32)
Calcium: 8.7 mg/dL — ABNORMAL LOW (ref 8.9–10.3)
Creatinine, Ser: 3.52 mg/dL — ABNORMAL HIGH (ref 0.44–1.00)
GFR calc Af Amer: 13 mL/min — ABNORMAL LOW (ref >60)
GFR calc non Af Amer: 11 mL/min — ABNORMAL LOW (ref >60)
GLUCOSE: 104 mg/dL — AB (ref 65–99)
POTASSIUM: 3.5 mmol/L (ref 3.5–5.1)
Sodium: 133 mmol/L — ABNORMAL LOW (ref 135–145)

## 2016-11-14 LAB — CBC
HEMATOCRIT: 31.2 % — AB (ref 36.0–46.0)
HEMOGLOBIN: 9.6 g/dL — AB (ref 12.0–15.0)
MCH: 30.8 pg (ref 26.0–34.0)
MCHC: 30.8 g/dL (ref 30.0–36.0)
MCV: 100 fL (ref 78.0–100.0)
Platelets: 260 10*3/uL (ref 150–400)
RBC: 3.12 MIL/uL — ABNORMAL LOW (ref 3.87–5.11)
RDW: 16.6 % — ABNORMAL HIGH (ref 11.5–15.5)
WBC: 8.6 10*3/uL (ref 4.0–10.5)

## 2016-11-14 LAB — MRSA PCR SCREENING: MRSA by PCR: POSITIVE — AB

## 2016-11-14 MED ORDER — MIDODRINE HCL 5 MG PO TABS
10.0000 mg | ORAL_TABLET | Freq: Two times a day (BID) | ORAL | Status: DC
  Administered 2016-11-14 – 2016-11-15 (×2): 10 mg via ORAL
  Filled 2016-11-14 (×2): qty 2

## 2016-11-14 MED ORDER — DOXYCYCLINE HYCLATE 100 MG PO TABS
100.0000 mg | ORAL_TABLET | Freq: Two times a day (BID) | ORAL | Status: DC
  Administered 2016-11-14 – 2016-11-15 (×3): 100 mg via ORAL
  Filled 2016-11-14 (×3): qty 1

## 2016-11-14 MED ORDER — CHLORHEXIDINE GLUCONATE CLOTH 2 % EX PADS
6.0000 | MEDICATED_PAD | Freq: Every day | CUTANEOUS | Status: DC
  Administered 2016-11-14: 6 via TOPICAL

## 2016-11-14 MED ORDER — MUPIROCIN 2 % EX OINT
1.0000 "application " | TOPICAL_OINTMENT | Freq: Two times a day (BID) | CUTANEOUS | Status: DC
  Administered 2016-11-14 – 2016-11-15 (×3): 1 via NASAL
  Filled 2016-11-14 (×2): qty 22

## 2016-11-14 MED ORDER — CEFAZOLIN SODIUM-DEXTROSE 2-4 GM/100ML-% IV SOLN
2.0000 g | INTRAVENOUS | Status: DC
  Administered 2016-11-14: 2 g via INTRAVENOUS
  Filled 2016-11-14: qty 100

## 2016-11-14 MED ORDER — PRO-STAT SUGAR FREE PO LIQD
30.0000 mL | Freq: Two times a day (BID) | ORAL | Status: DC
  Administered 2016-11-14 – 2016-11-15 (×3): 30 mL via ORAL
  Filled 2016-11-14 (×3): qty 30

## 2016-11-14 MED ORDER — HEPARIN SODIUM (PORCINE) 1000 UNIT/ML DIALYSIS: 1000.0000 [IU] | INTRAMUSCULAR | Status: DC | PRN

## 2016-11-14 MED ORDER — SODIUM CHLORIDE 0.9 % IV SOLN: 100.0000 mL | INTRAVENOUS | Status: DC | PRN

## 2016-11-14 MED ORDER — ALTEPLASE 2 MG IJ SOLR: 2.0000 mg | Freq: Once | INTRAMUSCULAR | Status: DC | PRN

## 2016-11-14 NOTE — Clinical Social Work Placement (Signed)
   CLINICAL SOCIAL WORK PLACEMENT  NOTE 11/14/16 - ANTICIPATED DISCHARGE TO [facility] ON Saturday, 11/15/16.  Date:  11/14/2016  Patient Details  Name: Debbie [name] MRN: [id number] Date of Birth: 01-Aug-1930  Clinical Social Work is seeking post-discharge placement for this patient at the [place] level of care (*CSW will initial, date and re-position this form in  chart as items are completed):  No (Son provided facility information by phone)   Patient/family provided with [place] Work Department's list of facilities offering this level of care within the geographic area requested by the patient (or if unable, by the patient's family).  Yes   Patient/family informed of their freedom to choose among providers that offer the needed level of care, that participate in Medicare, Medicaid or managed care program needed by the patient, have an available bed and are willing to accept the patient.  No   Patient/family informed of [facility]'s ownership interest in [facility] and [facility], as well as of the fact that they are under no obligation to receive care at these facilities.  PASRR submitted to EDS on       PASRR number received on       Existing PASRR number confirmed on 11/14/16     FL2 transmitted to all facilities in geographic area requested by pt/family on 11/14/16     FL2 transmitted to all facilities within larger geographic area on       Patient informed that his/her managed care company has contracts with or will negotiate with certain facilities, including the following:        Yes   Patient/family informed of bed offers received.  Patient chooses bed at [facility]     Physician recommends and patient chooses bed at      Patient to be transferred to [facility] on 11/14/16.  Patient to be transferred to facility by       Patient family notified on   of transfer.  Name  of family member notified:        PHYSICIAN       Additional Comment:    _______________________________________________ Sable Feil, LCSW 11/14/2016, 7:12 PM

## 2016-11-14 NOTE — Progress Notes (Signed)
Off unit to hemodialysis.

## 2016-11-14 NOTE — Progress Notes (Signed)
Pharmacy Antibiotic Note  Debbie [name] is a 83 y.o. female admitted on 11/13/2016 with osteomyelitis.  Pharmacy has been consulted for Ancef dosing.  Plan: Ancef 2g IV qMWF @ 2000 Doxycycline 100mg  PO BID Plans for 6 weeks of treatment Follow HD schedule/tolerance, c/s, clinical progression  Height: 5\' 4"  (162.6 cm) Weight: 165 lb (74.8 kg) IBW/kg (Calculated) : 54.7  Temp (24hrs), Avg:98.4 F (36.9 C), Min:98.3 F (36.8 C), Max:98.6 F (37 C)   Recent Labs Lab 11/13/16 1055 11/14/16 0402  WBC 8.1 8.6  CREATININE 2.72* 3.52*    Estimated Creatinine Clearance: 11.8 mL/min (A) (by C-G formula based on SCr of 3.52 mg/dL (H)).    Allergies  Allergen Reactions  . Codeine Other (See Comments)    Unusual mouth sensation    Antimicrobials this admission: Ancef 6/8 >>  Doxy 6/8 >>   Dose adjustments this admission: n/a  Microbiology results: 6/8 BCx: ngtd  6/8 MRSA PCR: positive   Thank you for allowing pharmacy to be a part of this patient's care.   Malek Skog D. Alencia Gordon, PharmD, BCPS Clinical Pharmacist Pager: [phone] Clinical Phone for 11/14/2016 until 3:30pm: [phone] If after 3:30pm, please call main pharmacy at [phone] 11/14/2016 1:25 PM

## 2016-11-14 NOTE — Progress Notes (Signed)
HD RN attempted report from bedside RN; bedside RN with another patient, will return call momentarily.

## 2016-11-14 NOTE — Clinical Social Work Note (Signed)
Clinical Social Work Assessment  Patient Details  Name: Debbie [name] MRN: [id number] Date of Birth: 1930/10/10  Date of referral:  11/13/16               Reason for consult:  Facility Placement                Permission sought to share information with:  Family Supports Permission granted to share information::  Yes, Verbal Permission Granted  Name::     Samya Siciliano  Agency::     Relationship::  Son  Contact Information:  [phone]  Housing/Transportation Living arrangements for the past 2 months:  Single Family Home Source of Information:  Adult Children Lolly Mustache) Patient Interpreter Needed:  None Criminal Activity/Legal Involvement Pertinent to Current Situation/Hospitalization:  No - Comment as needed Significant Relationships:  Adult Children Lives with:  Adult Children, Relatives (Patient's son Xuan Mateus and his 81 year old son live with patient.) Do you feel safe going back to the place where you live?  No (Son in agreement that ST rehab needed before returning home patient as there would be periods of time when no one would be at home to assist patient.er.) Need for family participation in patient care:  Yes (Comment)  Care giving concerns:  Talked with son regarding d/c disposition and recommendation and he is in agreement with ST rehab.  Social Worker assessment / plan:  CSW talked with son Belenda Cruise by phone regarding discharge plan and recommendation and he is in agreement with ST rehab.  Per son patient has been to rehab before. He explained that they were paying for a CNA to help his mother for 4 months, but her money has run out. Son informed that he would be contacted regarding facility responses.  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  Patient/Family's Understanding of and Emotional Response to Diagnosis, Current Treatment, and Prognosis:  Mr. Hochberg expressed concern regarding patient's rectal bleeding, her blood pressure and dementia. He expressed understanding that his mother cannot be by herself anymore.   Emotional Assessment Appearance:  Other (Comment Required (Did not talk with patient as she deferred to son) Attitude/Demeanor/Rapport:  Unable to Assess Affect (typically observed):  Unable to Assess Orientation:  Oriented to Self, Oriented to Place, Oriented to  Time, Oriented to Situation Alcohol / Substance use:  Never Used Psych involvement (Current and /or in the community):  No (Comment)  Discharge Needs  Concerns to be addressed:  Discharge Planning Concerns Readmission within the last 30 days:  No Current discharge risk:  None Barriers to Discharge:  [place], [place], LCSW 11/14/2016, 7:05 PM

## 2016-11-14 NOTE — Care Management Note (Addendum)
Case Management Note  Patient Details  Name: Debbie [name] MRN: [id number] Date of Birth: 1931/06/02  Subjective/Objective:    Rectal bleeding, wound on right foot                Action/Plan: Discharge Planning:  Please see previous NCM notes NCM spoke to pt and gave permission to contact son, Debbie [name] # [phone]. Contacted son and left message. PT recommends SNF. Explained to pt that PT/OT recommended SNF rehab. Will need PT/OT recommendation. Contacted therapy. Pt reports she has a caregiver that comes M-F for 8 hours. She has RW at home. Waiting final recommendations for home. HH arranged with [facility] if pt's dc home. Contacted [facility] rep with new referral.   PCP [name] MD   Expected Discharge Date:                Expected Discharge Plan:  [place] (vs. SNF )  In-House Referral:  Clinical Social Work  Discharge planning Services  CM Consult  Post Acute Care Choice:    Choice offered to:  Patient  DME Arranged:  (has RW) DME Agency:  NA  HH Arranged:  RN, PT, OT, Nurse's Aide [place] Agency:  [place]  Status of Service:  In process, will continue to follow  If discussed at Long Length of Stay Meetings, dates discussed:    Additional Comments:  Erenest Rasher, RN 11/14/2016, 12:09 PM

## 2016-11-14 NOTE — Evaluation (Signed)
Occupational Therapy Evaluation Patient Details Name: Debbie [name] MRN: [id number] DOB: 06/29/1930 Today's Date: 11/14/2016    History of Present Illness Pt is 83 y/o female presenting to the ED secondary to rectal bleeding. PMH includes dementia, Renal failure on HD MWF, HTN, PVD, cataracts, R posterior heel ulcer, COPD, DM and L AV fistula placement.    Clinical Impression   Pt admitted with above. She demonstrates the below listed deficits and will benefit from continued OT to maximize safety and independence with BADLs.  Pt presents to OT with generalized weakness, impaired balance, decreased activity tolerance.  She requires min A, overall for ADLs and min guard - mod A for functional transfers.  She is at high risk for falls, injury, and readmission.  Feel she would benefit from SNF level rehab to allow her to maximize independence and safety to return home with family.  02 sats 87% on RA, returned to mid 90s with 2L supplemental 02      Follow Up Recommendations  SNF    Equipment Recommendations  3 in 1 bedside commode    Recommendations for Other Services       Precautions / Restrictions Precautions Precautions: Fall Restrictions Weight Bearing Restrictions: No Other Position/Activity Restrictions: R heel ulcer       Mobility Bed Mobility Overal bed mobility: Needs Assistance Bed Mobility: Supine to Sit;Sit to Supine     Supine to sit: Min guard Sit to supine: Min guard      Transfers Overall transfer level: Needs assistance Equipment used: Rolling walker (2 wheeled) Transfers: Sit to/from [facility] Sit to Stand: Min guard;Mod assist Stand pivot transfers: Min guard       General transfer comment: Pt required min guard assist to rise from EOB, and mod A for toilet transfer     Balance Overall balance assessment: Needs assistance Sitting-balance support: No upper extremity supported;Feet supported Sitting balance-Leahy Scale:  Good Sitting balance - Comments: able to reach down to don socks    Standing balance support: No upper extremity supported Standing balance-Leahy Scale: Fair                             ADL either performed or assessed with clinical judgement   ADL Overall ADL's : Needs assistance/impaired Eating/Feeding: Independent   Grooming: Wash/dry hands;Wash/dry face;Oral care;Brushing hair;Min guard;Standing   Upper Body Bathing: Minimal assistance;Sitting   Lower Body Bathing: Minimal assistance;Sit to/from stand   Upper Body Dressing : Set up;Supervision/safety;Sitting   Lower Body Dressing: Minimal assistance;Sit to/from stand   Toilet Transfer: Moderate assistance;Ambulation;Comfort height toilet;Grab bars;RW Armed forces technical officer Details (indicate cue type and reason): Pt requires mod A to rise from commode  Toileting- Clothing Manipulation and Hygiene: Moderate assistance;Sit to/from stand Toileting - Clothing Manipulation Details (indicate cue type and reason): Pt incontinent of stool requiring assist for peri care      Functional mobility during ADLs: Min guard;Minimal assistance;Rolling walker General ADL Comments: Pt requires assist for thoroughness      Vision Baseline Vision/History: Wears glasses Wears Glasses: At all times Patient Visual Report: No change from baseline       Perception     Praxis      Pertinent Vitals/Pain Pain Assessment: Faces Faces Pain Scale: Hurts a little bit Pain Location: r heel  Pain Descriptors / Indicators: Grimacing Pain Intervention(s): Monitored during session     Hand Dominance Right   Extremity/Trunk Assessment Upper Extremity Assessment Upper  Extremity Assessment: Generalized weakness   Lower Extremity Assessment Lower Extremity Assessment: Defer to PT evaluation   Cervical / Trunk Assessment Cervical / Trunk Assessment: Kyphotic   Communication Communication Communication: No difficulties   Cognition  Arousal/Alertness: Awake/alert Behavior During Therapy: WFL for tasks assessed/performed Overall Cognitive Status: History of cognitive impairments - at baseline                                 General Comments: History of dementia at baseline   General Comments  02 sats 87% on RA.  Returned to mid 90s with 2L supplemental 02    Exercises     Shoulder Instructions      Home Living Family/patient expects to be discharged to:: Skilled nursing facility Living Arrangements: Children Available Help at Discharge: Family;Available PRN/intermittently Type of Home: House Home Access: Ramped entrance     Home Layout: One level     Bathroom Shower/Tub: Teacher, early years/pre: Standard     Home Equipment: Environmental consultant - 2 wheels          Prior Functioning/Environment Level of Independence: Needs assistance  Gait / Transfers Assistance Needed: Pt's son reports pt ambulated with RW at home, however, required assist to stand and get out of bed.  ADL's / Homemaking Assistance Needed: pt's son reported she needed assistance with bathing and dressing            OT Problem List: Decreased strength;Decreased activity tolerance;Impaired balance (sitting and/or standing);Decreased cognition;Decreased safety awareness;Decreased knowledge of use of DME or AE;Pain      OT Treatment/Interventions: Self-care/ADL training;Therapeutic exercise;DME and/or AE instruction;Therapeutic activities;Cognitive remediation/compensation;Patient/family education;Balance training    OT Goals(Current goals can be found in the care plan section) Acute Rehab OT Goals Patient Stated Goal: to go to rehab  OT Goal Formulation: With patient Time For Goal Achievement: 11/28/16 Potential to Achieve Goals: Good ADL Goals Pt Will Perform Grooming: with supervision;standing Pt Will Perform Upper Body Bathing: with supervision;sitting Pt Will Perform Lower Body Bathing: with supervision;sit  to/from stand Pt Will Perform Upper Body Dressing: with supervision;sitting Pt Will Perform Lower Body Dressing: with supervision;sit to/from stand Pt Will Transfer to Toilet: with supervision;ambulating;regular height toilet;bedside commode;grab bars Pt Will Perform Toileting - Clothing Manipulation and hygiene: with supervision;sit to/from stand  OT Frequency: Min 2X/week   Barriers to D/C: Decreased caregiver support          Co-evaluation              AM-PAC PT "6 Clicks" Daily Activity     Outcome Measure Help from another person eating meals?: None Help from another person taking care of personal grooming?: A Little Help from another person toileting, which includes using toliet, bedpan, or urinal?: A Lot Help from another person bathing (including washing, rinsing, drying)?: A Little Help from another person to put on and taking off regular upper body clothing?: A Little Help from another person to put on and taking off regular lower body clothing?: A Little 6 Click Score: 18   End of Session Equipment Utilized During Treatment: Rolling walker Nurse Communication: Mobility status  Activity Tolerance: Patient tolerated treatment well Patient left: in bed;with bed alarm set;with call bell/phone within reach  OT Visit Diagnosis: Unsteadiness on feet (R26.81);Muscle weakness (generalized) (M62.81);Pain;Cognitive communication deficit (R41.841) Pain - Right/Left: Right Pain - part of body: Ankle and joints of foot  Time: 6728-9791 OT Time Calculation (min): 32 min Charges:  OT General Charges $OT Visit: 1 Procedure OT Evaluation $OT Eval Moderate Complexity: 1 Procedure OT Treatments $Self Care/Home Management : 8-22 mins G-Codes: OT G-codes **NOT FOR INPATIENT CLASS** Functional Assessment Tool Used: AM-PAC 6 Clicks Daily Activity Functional Limitation: Self care Self Care Current Status (R0413): At least 40 percent but less than 60 percent impaired,  limited or restricted Self Care Goal Status (S4383): At least 1 percent but less than 20 percent impaired, limited or restricted   [facility], OTR/L [phone]   Lucille Passy M 11/14/2016, 1:39 PM

## 2016-11-14 NOTE — Progress Notes (Signed)
PROGRESS NOTE                                                                                                                                                                                                             Patient Demographics:    Debbie [name], is a 83 y.o. female, DOB - May 03, 1931, QIW:[id number]  Admit date - 11/13/2016   Admitting Physician [name], MD  Outpatient Primary MD for the patient is [name], MD  LOS - 0  Chief Complaint  Patient presents with  . Rectal Bleeding       Brief Narrative   83 y.o. female, With past medical history of COPD, diastolic CHF, diabetes, end-stage renal disease on hemodialysis Monday Wednesday Friday, started last February, presents from home with multiple complaints, generalized weakness, and bright red blood per rectum, and abdominal cramps, patient reports symptoms for last 2 weeks, Rectal bleeding thought to be secondary to hemorrhoid, patient was significantly hypotensive for which she was admitted for further workup.   Subjective:    Debbie [name] today Denies any complaints, despite being hypotensive she denies any dizziness, chest pain or shortness of breath, , had bowel movement overnight, with no evidence of any rectal bleed .    Assessment  & Plan :    Active Problems:   Diabetes mellitus with complication (HCC)   Diastolic heart failure (HCC)   Hypotension   Bright red blood per rectum  Hypotension - She is asymptomatic, per renal disease is chronic problem , significantly hypotensive today despite fluid bolus, most recent blood pressure 70/52, she was started on midodrine,  Renal will do  even UF today . - Follow on blood cultures, no growth to date   Rectal bleeding - Is most likely related to internal hemorrhoid bleed, but since admission to the hospital, hemoglobin remained stable, will resume aspirin next 24 hours if no recurrence of bleed  .   Abdominal cramp  - Resolved, no acute finding in CT abdomen and pelvis  Hypokalemia - Repleted, to be managed with dialysis  Right heel pressure ulcer/osteomyelitis - Wound care consulted, - Discussed with wound care physician Dr.Brrrito, there was no culture sent, discussed with ID Dr Linus Salmons, will treat empirically on by mouth doxycycline and cefazolin after dialysis for total of 6 weeks , as trying to avoid vancomycin  as patient is still making urine   ESRD - Consulted, for dialysis today  A. Fib - Heart rate controlled, continue with aspirin , but the candidate for anti-coagulation currently giving rectal bleed .  Chronic diastolic CHF - Appears to be euvolemic, actually slightly on the dry side, volume management with dialysis  Diabetes mellitus - Seems to be diet controlled, monitor CBGs  Generalized weakness - Seen by PT in ED, will need SNF placement, social worker consult placed  Patient profoundly hypotensive, still need further management and decisions regarding dialysis, medication, and antibiotic management for her osteomyelitis, will change her from observation to inpatient status.   Code Status : Full  Family Communication  : Tried to call somebody 1  Disposition Plan  : We'll need SNF placement  Consults  :  Renal, discussed with ID and wound care physician via phone  Procedures  : None  DVT Prophylaxis  :   SCDs   Lab Results  Component Value Date   PLT 260 11/14/2016    Antibiotics  :    Anti-infectives    None        Objective:   Vitals:   11/13/16 1830 11/13/16 2030 11/14/16 0412 11/14/16 1020  BP: 98/70 (!) 96/59 (!) 85/48 (!) 70/52  Pulse: (!) 57 (!) 111 68 81  Resp: 19 18 18  (!) 22  Temp:  98.6 F (37 C) 98.3 F (36.8 C) 98.3 F (36.8 C)  TempSrc:  Oral Oral Oral  SpO2: 100% 96% 100% (!) 89%  Weight:  74.8 kg (165 lb)    Height:        Wt Readings from Last 3 Encounters:  11/13/16 74.8 kg (165 lb)  09/17/16 72.5  kg (159 lb 12.8 oz)  09/03/16 72.6 kg (160 lb)     Intake/Output Summary (Last 24 hours) at 11/14/16 1224 Last data filed at 11/14/16 0900  Gross per 24 hour  Intake              240 ml  Output                0 ml  Net              240 ml     Physical Exam  Awake Alert, Oriented X 3,   No JVD, supple neck Symmetrical Chest wall movement, A minister into bilaterally, R>L.  irr irr ,No  new Murmurs, No Parasternal Heave +ve B.Sounds, Abd Soft, No tenderness,  No rebound - guarding or rigidity. No Cyanosis, Clubbing or edema, right heel ulcer, non-toxic appearing    Data Review:    CBC  Recent Labs Lab 11/13/16 1055 11/14/16 0402  WBC 8.1 8.6  HGB 10.4* 9.6*  HCT 33.5* 31.2*  PLT 263 260  MCV 99.4 100.0  MCH 30.9 30.8  MCHC 31.0 30.8  RDW 16.5* 16.6*    Chemistries   Recent Labs Lab 11/13/16 1055 11/14/16 0402  NA 137 133*  K 2.9* 3.5  CL 95* 95*  CO2 31 29  GLUCOSE 155* 104*  BUN 11 17  CREATININE 2.72* 3.52*  CALCIUM 8.8* 8.7*  AST 20  --   ALT 10*  --   ALKPHOS 82  --   BILITOT 0.9  --    ------------------------------------------------------------------------------------------------------------------ No results for input(s): CHOL, HDL, LDLCALC, TRIG, CHOLHDL, LDLDIRECT in the last 72 hours.  Lab Results  Component Value Date   HGBA1C 5.4 05/28/2016   ------------------------------------------------------------------------------------------------------------------ No results for input(s): TSH, T4TOTAL,  T3FREE, THYROIDAB in the last 72 hours.  Invalid input(s): FREET3 ------------------------------------------------------------------------------------------------------------------ No results for input(s): VITAMINB12, FOLATE, FERRITIN, TIBC, IRON, RETICCTPCT in the last 72 hours.  Coagulation profile No results for input(s): INR, PROTIME in the last 168 hours.  No results for input(s): DDIMER in the last 72 hours.  Cardiac Enzymes No  results for input(s): CKMB, TROPONINI, MYOGLOBIN in the last 168 hours.  Invalid input(s): CK ------------------------------------------------------------------------------------------------------------------    Component Value Date/Time   BNP 690.8 (H) 06/08/2016 0751    Inpatient Medications  Scheduled Meds: . atorvastatin  20 mg Oral Daily  . Chlorhexidine Gluconate Cloth  6 each Topical Q0600  . donepezil  5 mg Oral QHS  . feeding supplement (PRO-STAT SUGAR FREE 64)  30 mL Oral BID  . midodrine  10 mg Oral BID WC  . multivitamin  1 tablet Oral QHS  . mupirocin ointment  1 application Nasal BID   Continuous Infusions: . sodium chloride     PRN Meds:.acetaminophen **OR** acetaminophen, albuterol, sodium chloride  Micro Results Recent Results (from the past 240 hour(s))  Culture, blood (Routine X 2) w Reflex to ID Panel     Status: None (Preliminary result)   Collection Time: 11/13/16  8:05 PM  Result Value Ref Range Status   Specimen Description BLOOD LEFT HAND  Final   Special Requests IN PEDIATRIC BOTTLE Blood Culture adequate volume  Final   Culture NO GROWTH < 12 HOURS  Final   Report Status PENDING  Incomplete  Culture, blood (Routine X 2) w Reflex to ID Panel     Status: None (Preliminary result)   Collection Time: 11/13/16  8:09 PM  Result Value Ref Range Status   Specimen Description BLOOD LEFT ANTECUBITAL  Final   Special Requests IN PEDIATRIC BOTTLE Blood Culture adequate volume  Final   Culture NO GROWTH < 12 HOURS  Final   Report Status PENDING  Incomplete  MRSA PCR Screening     Status: Abnormal   Collection Time: 11/14/16  7:03 AM  Result Value Ref Range Status   MRSA by PCR POSITIVE (A) NEGATIVE Final    Comment:        The GeneXpert MRSA Assay (FDA approved for NASAL specimens only), is one component of a comprehensive MRSA colonization surveillance program. It is not intended to diagnose MRSA infection nor to guide or monitor treatment for MRSA  infections. RESULT CALLED TO, READ BACK BY AND VERIFIED WITH: C.YAP RN AT [phone] 11/14/16 BY A.DAVIS     Radiology Reports Ct Abdomen Pelvis Wo Contrast  Result Date: 11/13/2016 CLINICAL DATA:  Abdominal pain EXAM: CT ABDOMEN AND PELVIS WITHOUT CONTRAST TECHNIQUE: Multidetector CT imaging of the abdomen and pelvis was performed following the standard protocol without IV contrast. COMPARISON:  CT abdomen pelvis 12/17/2011 PET CT 01/16/2016 FINDINGS: Lower chest: Large right and medium-sized left pleural effusions with associated atelectasis. Hepatobiliary: Normal hepatic size and contours. No perihepatic ascites. No intra- or extrahepatic biliary dilatation. There is cholelithiasis without evidence of inflammation. Pancreas: Normal pancreatic contours. No peripancreatic fluid collection or pancreatic ductal dilatation. Spleen: Normal. Adrenals/Urinary Tract: Normal adrenal glands. Bilateral renal cortical atrophy and extensive renal vascular calcification, especially on the right. No hydronephrosis. Left-greater-than-right perinephric stranding. Stomach/Bowel: No hiatal hernia. The stomach and duodenum are unremarkable. No dilated small bowel. There is a large ventral abdominal hernia of the contains multiple small bowel loops, but there is no evidence of acute inflammation or obstruction. There is sigmoid diverticulosis without acute diverticulitis. Appendix  is not clearly visualized. Vascular/Lymphatic: There is atherosclerotic calcification of the non aneurysmal abdominal aorta. There is extensive atherosclerotic calcification within the aortic branch vessels. No abdominal or pelvic adenopathy. Reproductive: Status post hysterectomy.  No adnexal mass. Musculoskeletal: Multilevel thoracolumbar osteophytosis. No bony spinal canal stenosis. Anasarca. Other: No contributory non-categorized findings. IMPRESSION: 1. Large ventral abdominal hernia containing a large amount small bowel without evidence of obstruction  or acute inflammation, unchanged from 01/16/2016. 2. Large right and medium-sized left pleural effusions. 3. Rectosigmoid diverticulosis without acute inflammation. Apparent wall thickening is favored to be due to underdistention. 4. Bilateral renal atrophy. 5. Atherosclerosis of the aorta and major abdominal branch vessels. Electronically Signed   By: Ulyses Jarred M.D.   On: 11/13/2016 21:05     [facility], Maryjo Ragon M.D on 11/14/2016 at 12:24 PM  Between 7am to 7pm - Pager - [phone]  After 7pm go to [email or web address] - password [facility]  [facility] -  Office  [phone]

## 2016-11-14 NOTE — Procedures (Signed)
I was present at this dialysis session. I have reviewed the session itself and made appropriate changes.   Filed Weights   11/13/16 1012 11/13/16 2030  Weight: 72.1 kg (159 lb) 74.8 kg (165 lb)     Recent Labs Lab 11/14/16 0402  NA 133*  K 3.5  CL 95*  CO2 29  GLUCOSE 104*  BUN 17  CREATININE 3.52*  CALCIUM 8.7*     Recent Labs Lab 11/13/16 1055 11/14/16 0402  WBC 8.1 8.6  HGB 10.4* 9.6*  HCT 33.5* 31.2*  MCV 99.4 100.0  PLT 263 260    Scheduled Meds: . atorvastatin  20 mg Oral Daily  . Chlorhexidine Gluconate Cloth  6 each Topical Q0600  . donepezil  5 mg Oral QHS  . doxycycline  100 mg Oral Q12H  . feeding supplement (PRO-STAT SUGAR FREE 64)  30 mL Oral BID  . midodrine  10 mg Oral BID WC  . multivitamin  1 tablet Oral QHS  . mupirocin ointment  1 application Nasal BID   Continuous Infusions: . sodium chloride    . sodium chloride    . sodium chloride    .  ceFAZolin (ANCEF) IV     PRN Meds:.sodium chloride, sodium chloride, acetaminophen **OR** acetaminophen, albuterol, alteplase, heparin, sodium chloride   Pearson Grippe  MD 11/14/2016, 3:19 PM

## 2016-11-14 NOTE — Consult Note (Signed)
[facility] Renal Consultation Note    Indication for Consultation:  Management of ESRD/hemodialysis; anemia, hypertension/volume and secondary hyperparathyroidism  HPI: Debbie [name] is a 83 y.o. female with ESRD on HD since 07/2016, DM, COPD, diastolic CHF (Echo 92/3300 EF 50-55%), hypotension. Presented to ED from home yesterday with noted bleeding per rectum and generalized weakness. ED course was notable for hypotension with SBPs 70s-80s improved with small fluid bolus and she was admitted under observation status for further evaluation.   She is seen at bedside, alone. She denies any complaints currently. She knows where she is and why she is here. She says her son noticed blood in the toilet and brought her to the ED. She denies N, V, D, abdominal pain prior to admission. She says her appetite has been good. She says she had a BM this morning and didn't notice any bleeding. She lives at home with her son. Occasionally uses a walker to ambulate.   Dialyzes at [facility] MWF. Last HD was Wed 6/6.  Has been compliant with HD and completes full treatments. She does have hypotensive BP readings during treatments. She denies any symptoms during HD and says she doesn't notice when her BP is low. Denies syncope, dizziness, N,V,  on HD. Not currently on midodrine. Recently has been leaving below her EDW of 73 kg. Was 72.2 kg on 6/6 with net UF 0.7L and 72.0 kg on 6/4 with net UF 1.2L    Past Medical History:  Diagnosis Date  . Allergy   . Anxiety   . Arthritis   . Cancer ([place]) dx'd 12/2007   endometroid adenocarcinoma  . Cataract    both  . Diabetes mellitus    fasting [id number]  . Dizziness   . History of radiation therapy 9/21,9/28,10/05,05/20/2008   4 txs 2400 cGy endometrial adenocarcinoma  . Hypertension   . Peripheral vascular disease ([place])   . Seasonal allergies   . 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 UTI (urinary tract  infection) 05/2016   Past Surgical History:  Procedure Laterality Date  . ABDOMINAL HYSTERECTOMY    . APPENDECTOMY    . AV FISTULA PLACEMENT Left 07/17/2016   Procedure: LEFT ARM ARTERIOVENOUS (AV) FISTULA CREATION;  Surgeon: Angelia Mould, MD;  Location: [place];  Service: Vascular;  Laterality: Left;  . ENDARTERECTOMY Left 01/12/2014   Procedure: LEFT CAROTID ARTERY ENDARTERECTOMY WITH HEMASHIELD PATCH ANGIOPLASTY;  Surgeon: Mal Misty, MD;  Location: [place];  Service: Vascular;  Laterality: Left;  . ENDARTERECTOMY Right 03/17/2014   Procedure: RIGHT CAROTD ARTERY ENDARTERECTOMY WITH DACRON PATCH ANGIOPLASTY;  Surgeon: Mal Misty, MD;  Location: [place];  Service: Vascular;  Laterality: Right;  . EYE SURGERY Bilateral    cataracts  . INSERTION OF DIALYSIS CATHETER Right 07/17/2016   Procedure: INSERTION OF DIALYSIS CATHETER;  Surgeon: Angelia Mould, MD;  Location: [place];  Service: Vascular;  Laterality: Right;  . NASAL SEPTUM SURGERY    . NECK SURGERY Bilateral    arterial  . TONSILLECTOMY     Family History  Problem Relation Age of Onset  . Actinic keratosis Mother   . Cancer Mother        uterine  . Heart disease Father   . Hyperlipidemia Father   . Hypertension Father   . Stroke Father   . Cancer Sister        lung   Social History:  reports that she has never smoked.  She has never used smokeless tobacco. She reports that she does not drink alcohol or use drugs. Allergies  Allergen Reactions  . Codeine Other (See Comments)    Unusual mouth sensation   Prior to Admission medications   Medication Sig Start Date End Date Taking? Authorizing Provider  Albuterol Sulfate (PROAIR RESPICLICK) 967 (90 Base) MCG/ACT AEPB Inhale 1-2 puffs into the lungs every 4 (four) hours as needed. 06/02/16  Yes Patrecia Pour, Christean Grief, MD  aspirin EC 81 MG tablet Take 81 mg by mouth daily.   Yes [provider]  atorvastatin (LIPITOR) 20 MG tablet Take 20 mg by mouth daily.  05/26/16  Yes [provider]  B Complex-C-Folic Acid (NEPHRO-VITE PO) Take by mouth.   Yes [provider]  donepezil (ARICEPT) 5 MG tablet Take 5 mg by mouth at bedtime.   Yes [provider]  guaiFENesin (MUCINEX) 600 MG 12 hr tablet Take 1 tablet (600 mg total) by mouth 2 (two) times daily as needed. Patient taking differently: Take 600 mg by mouth 2 (two) times daily.  07/19/16  Yes Bonnielee Haff, MD  Multiple Vitamin (MULTIVITAMIN) tablet Take 1 tablet by mouth daily. Centrum silver   Yes [provider]  sodium chloride (OCEAN) 0.65 % SOLN nasal spray Place 1 spray into both nostrils as needed for congestion.   Yes [provider]   Current Facility-Administered Medications  Medication Dose Route Frequency Provider Last Rate Last Dose  . 0.9 %  sodium chloride infusion   Intravenous Continuous Elgergawy, Silver Huguenin, MD      . acetaminophen (TYLENOL) tablet 650 mg  650 mg Oral Q6H PRN Elgergawy, Silver Huguenin, MD       Or  . acetaminophen (TYLENOL) suppository 650 mg  650 mg Rectal Q6H PRN Elgergawy, Dawood S, MD      . albuterol (PROVENTIL) (2.5 MG/3ML) 0.083% nebulizer solution 2.5 mg  2.5 mg Nebulization Q2H PRN Elgergawy, Silver Huguenin, MD      . atorvastatin (LIPITOR) tablet 20 mg  20 mg Oral Daily Elgergawy, Silver Huguenin, MD   20 mg at 11/14/16 0959  . Chlorhexidine Gluconate Cloth 2 % PADS 6 each  6 each Topical Q0600 Elgergawy, Silver Huguenin, MD   6 each at 11/14/16 [phone]  . donepezil (ARICEPT) tablet 5 mg  5 mg Oral QHS Elgergawy, Silver Huguenin, MD   5 mg at 11/13/16 2244  . multivitamin (RENA-VIT) tablet 1 tablet  1 tablet Oral QHS Elgergawy, Silver Huguenin, MD   1 tablet at 11/13/16 2244  . mupirocin ointment (BACTROBAN) 2 % 1 application  1 application Nasal BID Elgergawy, Silver Huguenin, MD   1 application at 03/25/50 [phone]  . sodium chloride (OCEAN) 0.65 % nasal spray 1 spray  1 spray Each Nare PRN Elgergawy, Silver Huguenin, MD        ROS: As per HPI otherwise  negative.  Physical Exam: Vitals:   11/13/16 1830 11/13/16 2030 11/14/16 0412 11/14/16 1020  BP: 98/70 (!) 96/59 (!) 85/48 (!) 70/52  Pulse: (!) 57 (!) 111 68 81  Resp: 19 18 18  (!) 22  Temp:  98.6 F (37 C) 98.3 F (36.8 C) 98.3 F (36.8 C)  TempSrc:  Oral Oral Oral  SpO2: 100% 96% 100% (!) 89%  Weight:  74.8 kg (165 lb)    Height:         General: WDWN Pleasant elderly WF NAD  Head: NCAT sclera not icteric MMM Neck: Supple. No JVD No masses Lungs:  CTA bilaterally without wheezes, rales, or rhonchi. Breathing is unlabored. Heart: RRR with S1 S2 Abdomen: soft NT + BS Lower extremities:without edema or ischemic changes, R heel wound wrapped  Neuro: A & O  X 3. Moves all extremities spontaneously. Psych:  Responds to questions appropriately with a normal affect. Dialysis Access: RIJ TDC RUE AVF +bruit (have only used 1 needle at OP center)   Labs: Basic Metabolic Panel:  Recent Labs Lab 11/13/16 1055 11/14/16 0402  NA 137 133*  K 2.9* 3.5  CL 95* 95*  CO2 31 29  GLUCOSE 155* 104*  BUN 11 17  CREATININE 2.72* 3.52*  CALCIUM 8.8* 8.7*   Liver Function Tests:  Recent Labs Lab 11/13/16 1055  AST 20  ALT 10*  ALKPHOS 82  BILITOT 0.9  PROT 6.2*  ALBUMIN 3.0*   No results for input(s): LIPASE, AMYLASE in the last 168 hours. No results for input(s): AMMONIA in the last 168 hours. CBC:  Recent Labs Lab 11/13/16 1055 11/14/16 0402  WBC 8.1 8.6  HGB 10.4* 9.6*  HCT 33.5* 31.2*  MCV 99.4 100.0  PLT 263 260   Cardiac Enzymes: No results for input(s): CKTOTAL, CKMB, CKMBINDEX, TROPONINI in the last 168 hours. CBG: No results for input(s): GLUCAP in the last 168 hours. Iron Studies: No results for input(s): IRON, TIBC, TRANSFERRIN, FERRITIN in the last 72 hours. Studies/Results: Ct Abdomen Pelvis Wo Contrast  Result Date: 11/13/2016 CLINICAL DATA:  Abdominal pain EXAM: CT ABDOMEN AND PELVIS WITHOUT CONTRAST TECHNIQUE: Multidetector CT imaging of the abdomen  and pelvis was performed following the standard protocol without IV contrast. COMPARISON:  CT abdomen pelvis 12/17/2011 PET CT 01/16/2016 FINDINGS: Lower chest: Large right and medium-sized left pleural effusions with associated atelectasis. Hepatobiliary: Normal hepatic size and contours. No perihepatic ascites. No intra- or extrahepatic biliary dilatation. There is cholelithiasis without evidence of inflammation. Pancreas: Normal pancreatic contours. No peripancreatic fluid collection or pancreatic ductal dilatation. Spleen: Normal. Adrenals/Urinary Tract: Normal adrenal glands. Bilateral renal cortical atrophy and extensive renal vascular calcification, especially on the right. No hydronephrosis. Left-greater-than-right perinephric stranding. Stomach/Bowel: No hiatal hernia. The stomach and duodenum are unremarkable. No dilated small bowel. There is a large ventral abdominal hernia of the contains multiple small bowel loops, but there is no evidence of acute inflammation or obstruction. There is sigmoid diverticulosis without acute diverticulitis. Appendix is not clearly visualized. Vascular/Lymphatic: There is atherosclerotic calcification of the non aneurysmal abdominal aorta. There is extensive atherosclerotic calcification within the aortic branch vessels. No abdominal or pelvic adenopathy. Reproductive: Status post hysterectomy.  No adnexal mass. Musculoskeletal: Multilevel thoracolumbar osteophytosis. No bony spinal canal stenosis. Anasarca. Other: No contributory non-categorized findings. IMPRESSION: 1. Large ventral abdominal hernia containing a large amount small bowel without evidence of obstruction or acute inflammation, unchanged from 01/16/2016. 2. Large right and medium-sized left pleural effusions. 3. Rectosigmoid diverticulosis without acute inflammation. Apparent wall thickening is favored to be due to underdistention. 4. Bilateral renal atrophy. 5. Atherosclerosis of the aorta and major  abdominal branch vessels. Electronically Signed   By: Ulyses Jarred M.D.   On: 11/13/2016 21:05    Dialysis Orders:  [facility] MWF 4h 180F BFR 400/800 2K/2.25Ca   EDW 73 kg (post wt 72.2kg 6/6) -Heparin 2200  -Venofer 100 mg IV q HD x 7 (until 6/20) -Mircera 50 mcg IV q 2 weeks (last dose 6/4)  No VDRA No binder  Assessment/Plan: 1.  Rectal bleed - FOBT+ per notes likely related to hemorrhoids - w/u per  primary  2.  ESRD -  MWF - For HD today on schedule No heparin  3. Hypokalemia - K 2.9 on admission KCl dosed x1 - 4K bath with HD today  follow  4.  Hypotension/volume  - Chronic hypotension,asymptomatic  trial of midodrine with HD today -even UF 5.  Anemia  - Hgb 9.6 < outpt hgb 11.0 on 6/6 - esa/Fe dosed at outpt center 6/4 - follow  6.  Metabolic bone disease -  Outpt P/PTH at goal - No VDRA/binders yet - Follow renal panel  7.  Nutrition - Renal diet/prostat for low albumin 8. R heel wound - WOC following  9. Chronic diastolic CHF 10. Afib not on anticoagulaion  11. DM - per primary   Lynnda Child PA-C [place] Pager [phone] 11/14/2016, 10:39 AM

## 2016-11-14 NOTE — Progress Notes (Signed)
Physical Therapy Treatment Patient Details Name: Debbie [name] MRN: [id number] DOB: 01-Apr-1931 Today's Date: 11/14/2016    History of Present Illness Pt is 83 y/o female presenting to the ED secondary to rectal bleeding. PMH includes dementia, Renal failure on HD MWF, HTN, PVD, cataracts, R posterior heel ulcer, COPD, DM and L AV fistula placement.     PT Comments    Pt continues to be limited with mobility this session. Pt with overall decreased tolerance to activity as noted by O2 sats dropping in the 80s with activity. Pt will benefit from SNF at discharge in order to assist with improved independence prior to discharge.     Follow Up Recommendations  SNF     Equipment Recommendations  None recommended by PT    Recommendations for Other Services       Precautions / Restrictions Precautions Precautions: Fall Restrictions Weight Bearing Restrictions: No Other Position/Activity Restrictions: R heel ulcer     Mobility  Bed Mobility Overal bed mobility: Needs Assistance Bed Mobility: Supine to Sit;Sit to Supine     Supine to sit: Min guard Sit to supine: Min guard      Transfers Overall transfer level: Needs assistance Equipment used: Rolling walker (2 wheeled) Transfers: Sit to/from Stand Sit to Stand: Min assist Stand pivot transfers: Min guard       General transfer comment: Min A to ride from  lower surface and from EOB  Ambulation/Gait Ambulation/Gait assistance: Min assist Ambulation Distance (Feet): 15 Feet Assistive device: Rolling walker (2 wheeled) Gait Pattern/deviations: Step-through pattern;Decreased step length - right;Decreased step length - left Gait velocity: decreased Gait velocity interpretation: Below normal speed for age/gender General Gait Details: decreased cadence and step length bilaterally   Stairs            Wheelchair Mobility    Modified Rankin (Stroke Patients Only)       Balance Overall balance assessment:  Needs assistance Sitting-balance support: No upper extremity supported;Feet supported Sitting balance-Leahy Scale: Good Sitting balance - Comments: able to reach down to don socks    Standing balance support: No upper extremity supported Standing balance-Leahy Scale: Fair                              Cognition Arousal/Alertness: Awake/alert Behavior During Therapy: WFL for tasks assessed/performed Overall Cognitive Status: History of cognitive impairments - at baseline                                 General Comments: History of dementia at baseline      Exercises      General Comments General comments (skin integrity, edema, etc.): o2 sats 87% on RA, increase to 90% on 2L o2      Pertinent Vitals/Pain Pain Assessment: Faces Faces Pain Scale: Hurts a little bit Pain Location: r heel  Pain Descriptors / Indicators: Grimacing Pain Intervention(s): Monitored during session;Repositioned    Home Living Family/patient expects to be discharged to:: Skilled nursing facility Living Arrangements: Children Available Help at Discharge: Family;Available PRN/intermittently Type of Home: House Home Access: Ramped entrance   Home Layout: One level Home Equipment: Walker - 2 wheels      Prior Function Level of Independence: Needs assistance  Gait / Transfers Assistance Needed: Pt's son reports pt ambulated with RW at home, however, required assist to stand and get out of bed.  ADL's /  Homemaking Assistance Needed: pt's son reported she needed assistance with bathing and dressing     PT Goals (current goals can now be found in the care plan section) Acute Rehab PT Goals Patient Stated Goal: to go to rehab  Progress towards PT goals: Progressing toward goals    Frequency    Min 3X/week      PT Plan Current plan remains appropriate    Co-evaluation              AM-PAC PT "6 Clicks" Daily Activity  Outcome Measure  Difficulty turning over  in bed (including adjusting bedclothes, sheets and blankets)?: A Lot Difficulty moving from lying on back to sitting on the side of the bed? : Total Difficulty sitting down on and standing up from a chair with arms (e.g., wheelchair, bedside commode, etc,.)?: Total Help needed moving to and from a bed to chair (including a wheelchair)?: A Lot Help needed walking in hospital room?: A Lot Help needed climbing 3-5 steps with a railing? : Total 6 Click Score: 9    End of Session Equipment Utilized During Treatment: Gait belt Activity Tolerance: Patient tolerated treatment well Patient left: in bed;with call bell/phone within reach;with nursing/sitter in room Nurse Communication: Mobility status PT Visit Diagnosis: Unsteadiness on feet (R26.81)     Time: 7473-4037 PT Time Calculation (min) (ACUTE ONLY): 30 min  Charges:  $Gait Training: 8-22 mins $Therapeutic Activity: 8-22 mins                    G Codes:       Scheryl Marten PT, DPT  [phone]    Jacqulyn Liner Sloan Leiter 11/14/2016, 1:48 PM

## 2016-11-14 NOTE — NC FL2 (Signed)
[place] LEVEL OF CARE SCREENING TOOL     IDENTIFICATION  Patient Name: Debbie [name] Birthdate: 03-15-1931 Sex: female Admission Date (Current Location): 11/13/2016  [facility] and [place] Number:  Herbalist and Address:  [facility], [place] [address] [place], [place] [address]      Provider Number: [id number]  Attending Physician Name and Address:  [name], MD  Relative Name and Phone Number:  Lyndia Bury, son. [phone]    Current Level of Care: SNF Recommended Level of Care:  Prior Approval Number:    Date Approved/Denied:   PASRR Number: [id number] (Eff. 06/11/16)  Discharge Plan: SNF    Current Diagnoses: Patient Active Problem List   Diagnosis Date Noted  . Hypotension 11/13/2016  . Bright red blood per rectum 11/13/2016  . Central venous catheter in place   . AKI (acute kidney injury) ([place])   . Hyperkalemia   . Atrial fibrillation ([place])   . Bradycardia   . Renal failure 06/25/2016  . COPD exacerbation ([place]) 06/08/2016  . Diastolic heart failure ([place]) 06/08/2016  . Acute respiratory failure with hypoxia ([place])   . COPD with hypoxia ([place]) 06/02/2016  . Pressure injury of skin 05/29/2016  . Respiratory distress 05/29/2016  . UTI (urinary tract infection) 05/28/2016  . Hypoglycemia 05/28/2016  . Diabetes ([place]) 05/28/2016  . Anxiety 05/28/2016  . Hypertension 05/28/2016  . Diabetes mellitus with complication ([place])   . Renal insufficiency   . Urinary tract infection without hematuria   . 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 Carotid stenosis, asymptomatic 11/28/2014  . Aftercare following surgery of the circulatory system 04/11/2014  . Aftercare following surgery of the circulatory system, [place] 01/31/2014  . Carotid stenosis 01/10/2014  . Occlusion and stenosis of carotid artery without mention of cerebral infarction 01/10/2014  . Malignant  neoplasm of corpus uteri, except isthmus ([place]) 07/07/2011    Orientation RESPIRATION BLADDER Height & Weight     Self, Time, Situation, Place  Normal Continent Weight: 165 lb (74.8 kg) Height:  5\' 4"  (162.6 cm)  BEHAVIORAL SYMPTOMS/MOOD NEUROLOGICAL BOWEL NUTRITION STATUS      Incontinent Diet (Renal with fluid restriction, 1200 ML )  AMBULATORY STATUS COMMUNICATION OF NEEDS Skin   Limited Assist Verbally Other (Comment) (Unstageable pressure injury to right heel, treated with betadine)                       Personal Care Assistance Level of Assistance  Bathing, Feeding, Dressing Bathing Assistance: Limited assistance Feeding assistance: Independent Dressing Assistance: Limited assistance     Functional Limitations Info  Sight, Hearing, Speech Sight Info: Impaired (wears glasses for reading) Hearing Info: Adequate Speech Info: Adequate    SPECIAL CARE FACTORS FREQUENCY  PT (By licensed PT), OT (By licensed OT)     PT Frequency: Evaluated 6/7 and a minimum mof 3X per week therapy recommended OT Frequency: Evaluated 6/8 and a minimum of 2X per week therapy reocmmended            Contractures Contractures Info: Not present    Additional Factors Info  Code Status, Allergies Code Status Info: Full Allergies Info: Codeine           Current Medications (11/14/2016):  This is the current hospital active medication list Current Facility-Administered Medications  Medication Dose Route Frequency Provider Last Rate Last Dose  . 0.9 %  sodium chloride infusion   Intravenous  Continuous Elgergawy, Silver Huguenin, MD      . 0.9 %  sodium chloride infusion  100 mL Intravenous PRN Ejigiri, Thomos Lemons, PA-C      . 0.9 %  sodium chloride infusion  100 mL Intravenous PRN Ejigiri, Thomos Lemons, PA-C      . acetaminophen (TYLENOL) tablet 650 mg  650 mg Oral Q6H PRN Elgergawy, Silver Huguenin, MD       Or  . acetaminophen (TYLENOL) suppository 650 mg  650 mg Rectal Q6H PRN Elgergawy, Silver Huguenin, MD      . albuterol (PROVENTIL) (2.5 MG/3ML) 0.083% nebulizer solution 2.5 mg  2.5 mg Nebulization Q2H PRN Elgergawy, Silver Huguenin, MD      . alteplase (CATHFLO ACTIVASE) injection 2 mg  2 mg Intracatheter Once PRN Lynnda Child, PA-C      . atorvastatin (LIPITOR) tablet 20 mg  20 mg Oral Daily Elgergawy, Silver Huguenin, MD   20 mg at 11/14/16 0959  . ceFAZolin (ANCEF) IVPB 2g/100 mL premix  2 g Intravenous Q M,W,F-2000 Bajbus, Lauren D, RPH      . Chlorhexidine Gluconate Cloth 2 % PADS 6 each  6 each Topical Q0600 Elgergawy, Silver Huguenin, MD   6 each at 11/14/16 1126  . donepezil (ARICEPT) tablet 5 mg  5 mg Oral QHS Elgergawy, Silver Huguenin, MD   5 mg at 11/13/16 2244  . doxycycline (VIBRA-TABS) tablet 100 mg  100 mg Oral Q12H Elgergawy, Silver Huguenin, MD   100 mg at 11/14/16 1252  . feeding supplement (PRO-STAT SUGAR FREE 64) liquid 30 mL  30 mL Oral BID Lynnda Child, PA-C   30 mL at 11/14/16 1200  . heparin injection 1,000 Units  1,000 Units Dialysis PRN Lynnda Child, PA-C      . midodrine (PROAMATINE) tablet 10 mg  10 mg Oral BID WC Lynnda Child, PA-C   10 mg at 11/14/16 1253  . multivitamin (RENA-VIT) tablet 1 tablet  1 tablet Oral QHS Elgergawy, Silver Huguenin, MD   1 tablet at 11/13/16 2244  . mupirocin ointment (BACTROBAN) 2 % 1 application  1 application Nasal BID Elgergawy, Silver Huguenin, MD   1 application at 97/41/63 [phone]  . sodium chloride (OCEAN) 0.65 % nasal spray 1 spray  1 spray Each Nare PRN Elgergawy, Silver Huguenin, MD         Discharge Medications: Please see discharge summary for a list of discharge medications.  Relevant Imaging Results:  Relevant Lab Results:   Additional Information [phone]. Dialysis patient [facility] MWF - chair time 12 noon (per son).  Sable Feil, LCSW

## 2016-11-14 NOTE — Consult Note (Signed)
[facility] Nurse wound consult note Reason for Consult: right heel Wound type: Unstageable pressure injury right heel Pressure Injury POA: Yes/ Measurement: 1.0cmcm x 1.5cm x 0.1cm  Wound bed:100% black, wet  Drainage (amount, consistency, odor) minimal  Periwound: intact  Dressing procedure/placement/frequency: Paint right heel with betadine, allow to air dry. Cover with foam.  Use Prevalon boot to offload heel at all times. Explained rationale to patient  Discussed POC with patient and bedside nurse.  Re consult if needed, will not follow at this time. Thanks  Icie Kuznicki [employer], RN,CWOCN, CNS, [place] [phone])

## 2016-11-15 ENCOUNTER — Inpatient Hospital Stay (HOSPITAL_COMMUNITY): Payer: Medicare Other

## 2016-11-15 DIAGNOSIS — J90 Pleural effusion, not elsewhere classified: Secondary | ICD-10-CM

## 2016-11-15 DIAGNOSIS — R109 Unspecified abdominal pain: Secondary | ICD-10-CM | POA: Diagnosis not present

## 2016-11-15 DIAGNOSIS — I959 Hypotension, unspecified: Secondary | ICD-10-CM | POA: Diagnosis not present

## 2016-11-15 DIAGNOSIS — K648 Other hemorrhoids: Secondary | ICD-10-CM | POA: Diagnosis not present

## 2016-11-15 DIAGNOSIS — N186 End stage renal disease: Secondary | ICD-10-CM | POA: Diagnosis not present

## 2016-11-15 DIAGNOSIS — K625 Hemorrhage of anus and rectum: Secondary | ICD-10-CM | POA: Diagnosis not present

## 2016-11-15 LAB — CBC
HCT: 31.7 % — ABNORMAL LOW (ref 36.0–46.0)
Hemoglobin: 9.7 g/dL — ABNORMAL LOW (ref 12.0–15.0)
MCH: 30.5 pg (ref 26.0–34.0)
MCHC: 30.6 g/dL (ref 30.0–36.0)
MCV: 99.7 fL (ref 78.0–100.0)
Platelets: 233 10*3/uL (ref 150–400)
RBC: 3.18 MIL/uL — ABNORMAL LOW (ref 3.87–5.11)
RDW: 16.8 % — AB (ref 11.5–15.5)
WBC: 8.6 10*3/uL (ref 4.0–10.5)

## 2016-11-15 LAB — RENAL FUNCTION PANEL
Albumin: 2.8 g/dL — ABNORMAL LOW (ref 3.5–5.0)
Anion gap: 9 (ref 5–15)
BUN: 13 mg/dL (ref 6–20)
CHLORIDE: 97 mmol/L — AB (ref 101–111)
CO2: 27 mmol/L (ref 22–32)
Calcium: 8.6 mg/dL — ABNORMAL LOW (ref 8.9–10.3)
Creatinine, Ser: 2.32 mg/dL — ABNORMAL HIGH (ref 0.44–1.00)
GFR calc Af Amer: 21 mL/min — ABNORMAL LOW (ref >60)
GFR calc non Af Amer: 18 mL/min — ABNORMAL LOW (ref >60)
GLUCOSE: 132 mg/dL — AB (ref 65–99)
Phosphorus: 2.8 mg/dL (ref 2.5–4.6)
Potassium: 4 mmol/L (ref 3.5–5.1)
Sodium: 133 mmol/L — ABNORMAL LOW (ref 135–145)

## 2016-11-15 LAB — BODY FLUID CELL COUNT WITH DIFFERENTIAL
LYMPHS FL: 33 %
Monocyte-Macrophage-Serous Fluid: 55 % (ref 50–90)
NEUTROPHIL FLUID: 12 % (ref 0–25)
Total Nucleated Cell Count, Fluid: 105 cu mm (ref 0–1000)

## 2016-11-15 LAB — GRAM STAIN

## 2016-11-15 LAB — LACTATE DEHYDROGENASE, PLEURAL OR PERITONEAL FLUID: LD, Fluid: 45 U/L — ABNORMAL HIGH (ref 3–23)

## 2016-11-15 LAB — PROTEIN, PLEURAL OR PERITONEAL FLUID

## 2016-11-15 MED ORDER — MIDODRINE HCL 10 MG PO TABS: 10.0000 mg | ORAL_TABLET | Freq: Two times a day (BID) | ORAL | Status: AC

## 2016-11-15 MED ORDER — MUPIROCIN 2 % EX OINT: 1.0000 "application " | TOPICAL_OINTMENT | Freq: Two times a day (BID) | CUTANEOUS | 0 refills | Status: AC

## 2016-11-15 MED ORDER — SENNOSIDES-DOCUSATE SODIUM 8.6-50 MG PO TABS: 2.0000 | ORAL_TABLET | Freq: Two times a day (BID) | ORAL | 1 refills | Status: AC

## 2016-11-15 MED ORDER — DOXYCYCLINE HYCLATE 100 MG PO TABS: 100.0000 mg | ORAL_TABLET | Freq: Two times a day (BID) | ORAL | Status: AC

## 2016-11-15 MED ORDER — LIDOCAINE HCL 1 % IJ SOLN
INTRAMUSCULAR | Status: AC
  Filled 2016-11-15: qty 20

## 2016-11-15 MED ORDER — PRO-STAT SUGAR FREE PO LIQD: 30.0000 mL | Freq: Two times a day (BID) | ORAL | 0 refills | Status: AC

## 2016-11-15 MED ORDER — SACCHAROMYCES BOULARDII 250 MG PO CAPS: 250.0000 mg | ORAL_CAPSULE | Freq: Two times a day (BID) | ORAL | Status: DC

## 2016-11-15 MED ORDER — CEFAZOLIN SODIUM-DEXTROSE 2-4 GM/100ML-% IV SOLN: 2.0000 g | INTRAVENOUS | Status: AC

## 2016-11-15 NOTE — Progress Notes (Signed)
Patient discharged per MD. SW notified. [facility] contacted by SW to transport patient to [facility]. Bartholomew Crews, RN

## 2016-11-15 NOTE — Discharge Instructions (Signed)
Follow with Primary MD [name], MD in 7 days   Get CBC, CMP,  checked  by Primary MD next visit.    Activity: As tolerated with Full fall precautions use walker/cane & assistance as needed   Disposition SNF   Diet: Renal modified with 1200 mL fluid restriction, carbohydrate modified, with feeding assistance and aspiration precautions.   On your next visit with your primary care physician please Get Medicines reviewed and adjusted.   Please request your Prim.MD to go over all Hospital Tests and Procedure/Radiological results at the follow up, please get all Hospital records sent to your Prim MD by signing hospital release before you go home.   If you experience worsening of your admission symptoms, develop shortness of breath, life threatening emergency, suicidal or homicidal thoughts you must seek medical attention immediately by calling 911 or calling your MD immediately  if symptoms less severe.  You Must read complete instructions/literature along with all the possible adverse reactions/side effects for all the Medicines you take and that have been prescribed to you. Take any new Medicines after you have completely understood and accpet all the possible adverse reactions/side effects.   Do not drive, operating heavy machinery, perform activities at heights, swimming or participation in water activities or provide baby sitting services if your were admitted for syncope or siezures until you have seen by Primary MD or a Neurologist and advised to do so again.  Do not drive when taking Pain medications.    Do not take more than prescribed Pain, Sleep and Anxiety Medications  Special Instructions: If you have smoked or chewed Tobacco  in the last 2 yrs please stop smoking, stop any regular Alcohol  and or any Recreational drug use.  Wear Seat belts while driving.   Please note  You were cared for by a hospitalist during your hospital stay. If you have any questions about  your discharge medications or the care you received while you were in the hospital after you are discharged, you can call the unit and asked to speak with the hospitalist on call if the hospitalist that took care of you is not available. Once you are discharged, your primary care physician will handle any further medical issues. Please note that NO REFILLS for any discharge medications will be authorized once you are discharged, as it is imperative that you return to your primary care physician (or establish a relationship with a primary care physician if you do not have one) for your aftercare needs so that they can reassess your need for medications and monitor your lab values.

## 2016-11-15 NOTE — Clinical Social Work Placement (Signed)
   CLINICAL SOCIAL WORK PLACEMENT  NOTE  Date:  11/15/2016  Patient Details  Name: Debbie [name] MRN: [id number] Date of Birth: [birth year]  Clinical Social Work is seeking post-discharge placement for this patient at the [place] level of care (*CSW will initial, date and re-position this form in  chart as items are completed):  No (Son provided facility information by phone)   Patient/family provided with [place] Work Department's list of facilities offering this level of care within the geographic area requested by the patient (or if unable, by the patient's family).  Yes   Patient/family informed of their freedom to choose among providers that offer the needed level of care, that participate in Medicare, Medicaid or managed care program needed by the patient, have an available bed and are willing to accept the patient.  No   Patient/family informed of [facility]'s ownership interest in [facility] and [facility], as well as of the fact that they are under no obligation to receive care at these facilities.  PASRR submitted to EDS on       PASRR number received on       Existing PASRR number confirmed on 11/14/16     FL2 transmitted to all facilities in geographic area requested by pt/family on 11/14/16     FL2 transmitted to all facilities within larger geographic area on       Patient informed that his/her managed care company has contracts with or will negotiate with certain facilities, including the following:        Yes   Patient/family informed of bed offers received.  Patient chooses bed at [facility]     Physician recommends and patient chooses bed at      Patient to be transferred to [facility] on 11/15/16.  Patient to be transferred to facility by [facility]     Patient family notified on 11/15/16 of transfer.  Name of family member notified:        PHYSICIAN       Additional  Comment:  Pt ready and discharging to [place] confirmed bed with Abigail Butts at [facility] and sent clinicals through [facility]. RN to call room and report. CSW arranged transportation with [facility]. Progress notes faxed to [facility] with [employer] [phone]) and confirmation placed in [place] box.  CSW signing off as no further social work needs identified.  _______________________________________________ Truitt Merle, LCSW 11/15/2016, 4:57 PM

## 2016-11-15 NOTE — Discharge Summary (Addendum)
Debbie [name], is a 83 y.o. female  DOB 04-08-1931  MRN [id number].  Admission date:  11/13/2016  Admitting Physician  [name], MD  Discharge Date:  11/15/2016   Primary MD  [name], MD  Recommendations for primary care physician for things to follow:  - Please check CBC, BMP during next visit - Patient will need total of 6 weeks of IV cefazolin and by mouth doxycycline after hemodialysis, for osteomyelitis - Please follow on the final results of right pleural effusion cytology, Gram stain and cultures   Admission Diagnosis  Abdominal cramps [R10.9] Rectal bleeding [K62.5] ESRD (end stage renal disease) on dialysis ([place]) [N18.6, Z99.2] Skin ulcer of right heel, limited to breakdown of skin ([place]) [L97.411]   Discharge Diagnosis  Abdominal cramps [R10.9] Rectal bleeding [K62.5] ESRD (end stage renal disease) on dialysis ([place]) [N18.6, Z99.2] Skin ulcer of right heel, limited to breakdown of skin ([place]) [L97.411]    Active Problems:   Diabetes mellitus with complication (HCC)   Diastolic heart failure (HCC)   Hypotension   Bright red blood per rectum   Pleural effusion      Past Medical History:  Diagnosis Date  . Allergy   . Anxiety   . Arthritis   . Cancer ([place]) dx'd 12/2007   endometroid adenocarcinoma  . Cataract    both  . Diabetes mellitus    fasting [id number]  . Dizziness   . History of radiation therapy 9/21,9/28,10/05,05/20/2008   4 txs 2400 cGy endometrial adenocarcinoma  . Hypertension   . Peripheral vascular disease ([place])   . Seasonal allergies   . 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 UTI (urinary tract infection) 05/2016    Past Surgical History:  Procedure Laterality Date  . ABDOMINAL HYSTERECTOMY    . APPENDECTOMY    . AV FISTULA PLACEMENT Left 07/17/2016   Procedure: LEFT ARM ARTERIOVENOUS (AV) FISTULA CREATION;  Surgeon: Angelia Mould, MD;  Location: [facility];  Service: Vascular;  Laterality: Left;  . ENDARTERECTOMY Left 01/12/2014   Procedure: LEFT CAROTID ARTERY ENDARTERECTOMY WITH HEMASHIELD PATCH ANGIOPLASTY;  Surgeon: Mal Misty, MD;  Location: [facility];  Service: Vascular;  Laterality: Left;  . ENDARTERECTOMY Right 03/17/2014   Procedure: RIGHT CAROTD ARTERY ENDARTERECTOMY WITH DACRON PATCH ANGIOPLASTY;  Surgeon: Mal Misty, MD;  Location: [facility];  Service: Vascular;  Laterality: Right;  . EYE SURGERY Bilateral    cataracts  . INSERTION OF DIALYSIS CATHETER Right 07/17/2016   Procedure: INSERTION OF DIALYSIS CATHETER;  Surgeon: Angelia Mould, MD;  Location: [place] [facility];  Service: Vascular;  Laterality: Right;  . NASAL SEPTUM SURGERY    . NECK SURGERY Bilateral    arterial  . TONSILLECTOMY         History of present illness and  Hospital Course:     Kindly see H&P for history of present illness and admission details, please review complete Labs, Consult reports and Test reports for all details in brief  HPI  from  the history and physical done on the day of admission 11/13/2016  Rei Contee  is a 83 y.o. female, With past medical history of COPD, diastolic CHF, diabetes, end-stage renal disease on hemodialysis Monday Wednesday Friday, started last February, presents from home with multiple complaints, generalized weakness, and bright red blood per rectum, and abdominal cramps, patient reports symptoms for last 2 weeks, only small amount of blood in stool, intermittent, and reports abdominal cramps, occasional, unrelated to eating, she denies any nausea, vomiting, melena, or vaginal bleeding, plan was to discharge patient from ED  as her hemoglobin was stable,And it was felt to be hemorrhoidal bleed, but then she started to have low blood pressure, with systolic in the 16X or 45W, where she has received 200 mL fluid bolus, where her hypotension has improved. As well patient had recent right heel pressure  ulcer, where she had biopsy done, son was told it is infected, but she has not been started on any antibiotics.  Hospital Course  83 y.o.female,With past medical history of COPD, diastolic CHF, diabetes, end-stage renal disease on hemodialysis Monday Wednesday Friday, started last February, presents from home with multiple complaints, generalized weakness, and bright red blood per rectum, and abdominal cramps, patient reports symptoms for last 2 weeks, Rectal bleeding thought to be secondary to hemorrhoid, patient was significantly hypotensive for which she was admitted for further workup. Hypotension - As discussed with renal, this appears to be chronic problem, and she usually runs low blood pressure during dialysis once they compared treating at hemodialysis center , she is totally asymptomatic with these low numbers during hospital stay , she was started on midodrine, with good response, her average systolic blood pressure in the 80s to 90s , so Lowella Petties will be continued on discharge, as with renal, there is no need to change her UF, or EDW . - Blood cultures with no growth to date  Rectal bleeding - Is most likely related to internal hemorrhoid bleed, hemoglobin remained stable, it is 9.7 on discharge, which is around her baseline, by reviewing records, hemoglobin has been between 6 and 7 in February where she required blood transfusions .  Anemia - Anemia of chronic kidney disease, on erythropoietin as an outpatient  Pleural effusion - CT abdomen pelvis with evidence of right large pleural effusion, status post thoracentesis on 6/9 with 1.1 L drained, clear yellow fluid, appears to be transudate as total protein is 3, LD is 45, follow on cytology, Gram stain and culture  Abdominal cramp  - Resolved, no acute finding in CT abdomen and pelvis  Hypokalemia - Repleted, to be managed with dialysis  Right heel pressure ulcer/osteomyelitis - Wound care consulted, - Discussed with  wound care physician Dr.Brrrito, there was no culture sent, discussed with ID Dr Linus Salmons, will treat empirically on by mouth doxycycline and cefazolin after dialysis for total of 6 weeks , as trying to avoid vancomycin as patient is still making urine , discussed with renal time of discharge, they will arrange for IV cefazolin after dialysis.  ESRD - Consulted, for dialysis today - She is still making urine, creatinine is 2.3 day of discharge  A. Fib - Heart rate controlled, continue with aspirin , currently not a candidate for anti-coagulation currently giving rectal bleed , this can be considered in the future when she is stable.  Chronic diastolic CHF - Appears to be euvolemic, volume management with dialysis  Diabetes mellitus - Managed with diet  Generalized weakness - Seen by PT in  ED, will need SNF placement,    Discharge Condition:   Stable  Follow UP  Follow-up Information    [facility].   Why:  Home Health RN, Physical Therapy and Occupational Therapy. If discharged to SNF-rehab. [facility] will follow post discharge from SNF.  Contact information: [place] [address] [phone]        [name], MD Follow up.   Specialty:  Internal Medicine Contact information: [address], [place] [address] [phone]             Discharge Instructions  and  Discharge Medications    Discharge Instructions    Discharge instructions    Complete by:  As directed    Follow with Primary MD [name], MD in 7 days   Get CBC, CMP,  checked  by Primary MD next visit.    Activity: As tolerated with Full fall precautions use walker/cane & assistance as needed   Disposition SNF   Diet: Renal modified with 1200 mL fluid restriction, carbohydrate modified with feeding assistance and aspiration precautions.   On your next visit with your primary care physician please Get Medicines reviewed  and adjusted.   Please request your Prim.MD to go over all Hospital Tests and Procedure/Radiological results at the follow up, please get all Hospital records sent to your Prim MD by signing hospital release before you go home.   If you experience worsening of your admission symptoms, develop shortness of breath, life threatening emergency, suicidal or homicidal thoughts you must seek medical attention immediately by calling 911 or calling your MD immediately  if symptoms less severe.  You Must read complete instructions/literature along with all the possible adverse reactions/side effects for all the Medicines you take and that have been prescribed to you. Take any new Medicines after you have completely understood and accpet all the possible adverse reactions/side effects.   Do not drive, operating heavy machinery, perform activities at heights, swimming or participation in water activities or provide baby sitting services if your were admitted for syncope or siezures until you have seen by Primary MD or a Neurologist and advised to do so again.  Do not drive when taking Pain medications.    Do not take more than prescribed Pain, Sleep and Anxiety Medications  Special Instructions: If you have smoked or chewed Tobacco  in the last 2 yrs please stop smoking, stop any regular Alcohol  and or any Recreational drug use.  Wear Seat belts while driving.   Please note  You were cared for by a hospitalist during your hospital stay. If you have any questions about your discharge medications or the care you received while you were in the hospital after you are discharged, you can call the unit and asked to speak with the hospitalist on call if the hospitalist that took care of you is not available. Once you are discharged, your primary care physician will handle any further medical issues. Please note that NO REFILLS for any discharge medications will be authorized once you are discharged, as it is  imperative that you return to your primary care physician (or establish a relationship with a primary care physician if you do not have one) for your aftercare needs so that they can reassess your need for medications and monitor your lab values.   Increase activity slowly    Complete by:  As directed      Allergies as of 11/15/2016  Reactions   Codeine Other (See Comments)   Unusual mouth sensation      Medication List    TAKE these medications   Albuterol Sulfate 108 (90 Base) MCG/ACT Aepb Commonly known as:  PROAIR RESPICLICK Inhale 1-2 puffs into the lungs every 4 (four) hours as needed.   aspirin EC 81 MG tablet Take 81 mg by mouth daily.   atorvastatin 20 MG tablet Commonly known as:  LIPITOR Take 20 mg by mouth daily.   ceFAZolin 2-4 GM/100ML-% IVPB Commonly known as:  ANCEF Inject 100 mLs (2 g total) into the vein every Monday, Wednesday, and Friday at 8 PM. Patient will need to continue cefazolin every Monday, Wednesday and Friday after hemodialysis for total of 6 weeks with a stop date 12/25/2016, Start taking on:  11/17/2016   donepezil 5 MG tablet Commonly known as:  ARICEPT Take 5 mg by mouth at bedtime.   doxycycline 100 MG tablet Commonly known as:  VIBRA-TABS Take 1 tablet (100 mg total) by mouth every 12 (twelve) hours. To continue through 12/25/2016   feeding supplement (PRO-STAT SUGAR FREE 64) Liqd Take 30 mLs by mouth 2 (two) times daily.   guaiFENesin 600 MG 12 hr tablet Commonly known as:  MUCINEX Take 1 tablet (600 mg total) by mouth 2 (two) times daily as needed. What changed:  when to take this   midodrine 10 MG tablet Commonly known as:  PROAMATINE Take 1 tablet (10 mg total) by mouth 2 (two) times daily with a meal.   multivitamin tablet Take 1 tablet by mouth daily. Centrum silver   mupirocin ointment 2 % Commonly known as:  BACTROBAN Place 1 application into the nose 2 (two) times daily.   NEPHRO-VITE PO Take by mouth.     saccharomyces boulardii 250 MG capsule Commonly known as:  FLORASTOR Take 1 capsule (250 mg total) by mouth 2 (two) times daily.   senna-docusate 8.6-50 MG tablet Commonly known as:  Senokot-S Take 2 tablets by mouth 2 (two) times daily.   sodium chloride 0.65 % Soln nasal spray Commonly known as:  OCEAN Place 1 spray into both nostrils as needed for congestion.         Diet and Activity recommendation: See Discharge Instructions above   Consults obtained -  Renal, discussed with ID and wound care physician via phone   Major procedures and Radiology Reports - PLEASE review detailed and final reports for all details, in brief -   Ultrasound thoracentesis with 1.1 L of clear yellow fluid drained  Ct Abdomen Pelvis Wo Contrast  Result Date: 11/13/2016 CLINICAL DATA:  Abdominal pain EXAM: CT ABDOMEN AND PELVIS WITHOUT CONTRAST TECHNIQUE: Multidetector CT imaging of the abdomen and pelvis was performed following the standard protocol without IV contrast. COMPARISON:  CT abdomen pelvis 12/17/2011 PET CT 01/16/2016 FINDINGS: Lower chest: Large right and medium-sized left pleural effusions with associated atelectasis. Hepatobiliary: Normal hepatic size and contours. No perihepatic ascites. No intra- or extrahepatic biliary dilatation. There is cholelithiasis without evidence of inflammation. Pancreas: Normal pancreatic contours. No peripancreatic fluid collection or pancreatic ductal dilatation. Spleen: Normal. Adrenals/Urinary Tract: Normal adrenal glands. Bilateral renal cortical atrophy and extensive renal vascular calcification, especially on the right. No hydronephrosis. Left-greater-than-right perinephric stranding. Stomach/Bowel: No hiatal hernia. The stomach and duodenum are unremarkable. No dilated small bowel. There is a large ventral abdominal hernia of the contains multiple small bowel loops, but there is no evidence of acute inflammation or obstruction. There is sigmoid  diverticulosis without  acute diverticulitis. Appendix is not clearly visualized. Vascular/Lymphatic: There is atherosclerotic calcification of the non aneurysmal abdominal aorta. There is extensive atherosclerotic calcification within the aortic branch vessels. No abdominal or pelvic adenopathy. Reproductive: Status post hysterectomy.  No adnexal mass. Musculoskeletal: Multilevel thoracolumbar osteophytosis. No bony spinal canal stenosis. Anasarca. Other: No contributory non-categorized findings. IMPRESSION: 1. Large ventral abdominal hernia containing a large amount small bowel without evidence of obstruction or acute inflammation, unchanged from 01/16/2016. 2. Large right and medium-sized left pleural effusions. 3. Rectosigmoid diverticulosis without acute inflammation. Apparent wall thickening is favored to be due to underdistention. 4. Bilateral renal atrophy. 5. Atherosclerosis of the aorta and major abdominal branch vessels. Electronically Signed   By: Ulyses Jarred M.D.   On: 11/13/2016 21:05   Dg Chest 1 View  Result Date: 11/15/2016 CLINICAL DATA:  Status post right thoracentesis. EXAM: CHEST 1 VIEW COMPARISON:  07/17/2016 and prior chest radiographs. 11/13/2016 abdominal CT FINDINGS: Cardiomegaly again noted. Decreased right pleural effusion identified. There is no evidence of pneumothorax. Small left pleural effusion and left lower lung atelectasis again noted. A right IJ central venous catheter with tip overlying the superior cavoatrial junction noted. IMPRESSION: Decreased right pleural effusion status post thoracentesis. No evidence of pneumothorax. Unchanged small left pleural effusion and left lower lung atelectasis. Electronically Signed   By: Margarette Canada M.D.   On: 11/15/2016 12:49   US Thoracentesis Asp Pleural Space W/img Guide  Result Date: 11/15/2016 INDICATION: Patient with right-sided pleural effusion. Request is made for diagnostic and therapeutic thoracentesis. EXAM: ULTRASOUND GUIDED  DIAGNOSTIC AND THERAPEUTIC RIGHT THORACENTESIS MEDICATIONS: 10 mL 1% lidocaine COMPLICATIONS: None immediate. PROCEDURE: An ultrasound guided thoracentesis was thoroughly discussed with the patient and questions answered. The benefits, risks, alternatives and complications were also discussed. The patient understands and wishes to proceed with the procedure. Written consent was obtained. Ultrasound was performed to localize and mark an adequate pocket of fluid in the right chest. The area was then prepped and draped in the normal sterile fashion. 1% Lidocaine was used for local anesthesia. Under ultrasound guidance a Safe-T-centesis catheter was introduced. Thoracentesis was performed. The catheter was removed and a dressing applied. FINDINGS: A total of approximately 1.1 liters of clear, yellow fluid was removed. Samples were sent to the laboratory as requested by the clinical team. IMPRESSION: Successful ultrasound guided diagnostic and therapeutic right thoracentesis yielding 1.1 liters of pleural fluid. Read by:  Brynda Greathouse PA-C Electronically Signed   By: Marybelle Killings M.D.   On: 11/15/2016 12:05    Micro Results    Recent Results (from the past 240 hour(s))  Culture, blood (Routine X 2) w Reflex to ID Panel     Status: None (Preliminary result)   Collection Time: 11/13/16  8:05 PM  Result Value Ref Range Status   Specimen Description BLOOD LEFT HAND  Final   Special Requests IN PEDIATRIC BOTTLE Blood Culture adequate volume  Final   Culture NO GROWTH 2 DAYS  Final   Report Status PENDING  Incomplete  Culture, blood (Routine X 2) w Reflex to ID Panel     Status: None (Preliminary result)   Collection Time: 11/13/16  8:09 PM  Result Value Ref Range Status   Specimen Description BLOOD LEFT ANTECUBITAL  Final   Special Requests IN PEDIATRIC BOTTLE Blood Culture adequate volume  Final   Culture NO GROWTH 2 DAYS  Final   Report Status PENDING  Incomplete  MRSA PCR Screening     Status:  Abnormal  Collection Time: 11/14/16  7:03 AM  Result Value Ref Range Status   MRSA by PCR POSITIVE (A) NEGATIVE Final    Comment:        The GeneXpert MRSA Assay (FDA approved for NASAL specimens only), is one component of a comprehensive MRSA colonization surveillance program. It is not intended to diagnose MRSA infection nor to guide or monitor treatment for MRSA infections. RESULT CALLED TO, READ BACK BY AND VERIFIED WITH: C.YAP RN AT 4536 11/14/16 BY A.DAVIS        Today   Subjective:   Aletheia Tangredi today has no headache,no chest or abdominal pain,no new weakness tingling or numbness, feels much better  today.   Objective:   Blood pressure (!) 82/62, pulse 76, temperature 98.4 F (36.9 C), temperature source Oral, resp. rate 18, height 5\' 4"  (1.626 m), weight 65.7 kg (144 lb 13.5 oz), SpO2 98 %.   Intake/Output Summary (Last 24 hours) at 11/15/16 1424 Last data filed at 11/15/16 1316  Gross per 24 hour  Intake              580 ml  Output                0 ml  Net              580 ml    Exam Awake Alert, Oriented x 3,   Symmetrical Chest wall movement, Diminished air entry at the bases RRR,No Gallops,Rubs or new Murmurs, No Parasternal Heave +ve B.Sounds, Abd Soft, Non tender, No organomegaly appriciated, No rebound -guarding or rigidity. No Cyanosis, Clubbing or edema, No new Rash or bruise  Data Review   CBC w Diff:  Lab Results  Component Value Date   WBC 8.6 11/15/2016   HGB 9.7 (L) 11/15/2016   HGB 12.7 12/08/2011   HCT 31.7 (L) 11/15/2016   HCT 38.0 12/08/2011   PLT 233 11/15/2016   PLT 281 12/08/2011   LYMPHOPCT 5 06/08/2016   LYMPHOPCT 24.8 12/08/2011   MONOPCT 4 06/08/2016   MONOPCT 9.1 12/08/2011   EOSPCT 1 06/08/2016   EOSPCT 2.6 12/08/2011   BASOPCT 0 06/08/2016   BASOPCT 0.7 12/08/2011    CMP:  Lab Results  Component Value Date   NA 133 (L) 11/15/2016   NA 143 01/04/2016   K 4.0 11/15/2016   K 4.1 01/04/2016   CL 97 (L)  11/15/2016   CO2 27 11/15/2016   CO2 25 01/04/2016   BUN 13 11/15/2016   BUN 25.7 01/04/2016   CREATININE 2.32 (H) 11/15/2016   CREATININE 1.9 (H) 01/04/2016   PROT 6.2 (L) 11/13/2016   ALBUMIN 2.8 (L) 11/15/2016   BILITOT 0.9 11/13/2016   ALKPHOS 82 11/13/2016   AST 20 11/13/2016   ALT 10 (L) 11/13/2016  .   Total Time in preparing paper work, data evaluation and todays exam - 35 minutes  Cliford Sequeira M.D on 11/15/2016 at 2:24 PM  [facility]   Office  [phone]

## 2016-11-15 NOTE — Procedures (Signed)
PROCEDURE SUMMARY:  Successful US guided right diagnostic and therapeutic thoracentesis. Yielded 1.1 liters of clear, yellow fluid. Pt tolerated procedure well. No immediate complications.  Specimen was sent for labs. CXR ordered.  Docia Barrier PA-C 11/15/2016 12:03 PM

## 2016-11-15 NOTE — Progress Notes (Signed)
Report called to Bill at [employer]. [facility] notified for transport by Education officer, museum. Bartholomew Crews, RN

## 2016-11-15 NOTE — Progress Notes (Signed)
[facility] Progress Note  Subjective:  Seen in bed. No c/o this am. Feels "as good as can be expected"  Thinks she may have had some blood with BM this am  Tolerated HD yesterday no issues Denies SOB, chest pain, dizziness, abdominal pain, N/V/D PT/OT yesterday recommending SNF    Objective Vitals:   11/14/16 1842 11/14/16 1913 11/14/16 2100 11/15/16 0652  BP: (!) 116/95 (!) 102/56 (!) 82/53 (!) 89/65  Pulse: 77 86 (!) 59 81  Resp: 19 18 16 18   Temp: 98.3 F (36.8 C) 98 F (36.7 C) 98.3 F (36.8 C) 98.9 F (37.2 C)  TempSrc: Oral Oral Oral Oral  SpO2: 99% 95% 98% 91%  Weight: 65.7 kg (144 lb 13.5 oz)     Height:       Physical Exam General: WDWN Pleasant elderly WF NAD Heart: RRR  Lungs: diminished but CTAB anteriorly  Abdomen: soft NT/ND Extremities: no LE edema, R heel wound wrapped Dialysis Access: RIJ TDC RUE AVF +bruit (have only used 1 needle at OP center)   Dialysis Orders:  [facility] MWF 4h 180F BFR 400/800 2K/2.25Ca   EDW 73 kg (post wt 72.2kg 6/6) -Heparin 2200  -Venofer 100 mg IV q HD x 7 (until 6/20) -Mircera 50 mcg IV q 2 weeks (last dose 6/4)  No VDRA No binder    Assessment/Plan: 1. Rectal bleed - FOBT+ per notes likely related to hemorrhoids - hgb stable - w/u per primary  2. ESRD -  MWF - Next HD Monday 6/11   3. Bilat pleural effusions on abd CT - per primary to consider thoracentesis  4. Hypotension/volume  - Chronic hypotension,asymptomatic  - cont midodrine on HD/ post HD wt 6/8 65.7kg  no UF ?accuarcy of bed weights  5. Anemia  - Hgb 9.7 < outpt hgb 11.0 on 6/6 - esa/Fe dosed at outpt center 6/4 - follow  6.  Metabolic bone disease - Ca/P controlled  - No VDRA/binders yet  7.  Nutrition - Renal diet/prostat for low albumin 8. R heel wound/osteomyelitis  - po doxy/IV Cefazolin - will need cefazolin x 6 weeks total with HD 9. Chronic diastolic CHF 10. Afib not on anticoagulaion  11. DM - per primary  12. Dispo - PT/OT  recommending SNF   Ogechi Larina Earthly PA-C [facility] Pager [phone] 11/15/2016,9:49 AM  LOS: 1 day   Pt seen, examined and agree w A/P as above.  Kelly Splinter MD [facility] pager [phone]   11/15/2016, 2:00 PM    Additional Objective Labs: Basic Metabolic Panel:  Recent Labs Lab 11/13/16 1055 11/14/16 0402 11/15/16 0629  NA 137 133* 133*  K 2.9* 3.5 4.0  CL 95* 95* 97*  CO2 31 29 27   GLUCOSE 155* 104* 132*  BUN 11 17 13   CREATININE 2.72* 3.52* 2.32*  CALCIUM 8.8* 8.7* 8.6*  PHOS  --   --  2.8   Liver Function Tests:  Recent Labs Lab 11/13/16 1055 11/15/16 0629  AST 20  --   ALT 10*  --   ALKPHOS 82  --   BILITOT 0.9  --   PROT 6.2*  --   ALBUMIN 3.0* 2.8*   No results for input(s): LIPASE, AMYLASE in the last 168 hours. CBC:  Recent Labs Lab 11/13/16 1055 11/14/16 0402 11/15/16 0629  WBC 8.1 8.6 8.6  HGB 10.4* 9.6* 9.7*  HCT 33.5* 31.2* 31.7*  MCV 99.4 100.0 99.7  PLT 263 260 233  Blood Culture    Component Value Date/Time   SDES BLOOD LEFT ANTECUBITAL 11/13/2016 2009   SPECREQUEST IN PEDIATRIC BOTTLE Blood Culture adequate volume 11/13/2016 2009   CULT NO GROWTH < 12 HOURS 11/13/2016 2009   REPTSTATUS PENDING 11/13/2016 2009    Cardiac Enzymes: No results for input(s): CKTOTAL, CKMB, CKMBINDEX, TROPONINI in the last 168 hours. CBG: No results for input(s): GLUCAP in the last 168 hours. Iron Studies: No results for input(s): IRON, TIBC, TRANSFERRIN, FERRITIN in the last 72 hours. Lab Results  Component Value Date   INR 1.03 07/17/2016   INR 0.96 06/08/2016   INR 0.94 03/07/2014   Medications: . sodium chloride    .  ceFAZolin (ANCEF) IV Stopped (11/14/16 2140)   . atorvastatin  20 mg Oral Daily  . Chlorhexidine Gluconate Cloth  6 each Topical Q0600  . donepezil  5 mg Oral QHS  . doxycycline  100 mg Oral Q12H  . feeding supplement (PRO-STAT SUGAR FREE 64)  30 mL Oral BID  . midodrine  10 mg  Oral BID WC  . multivitamin  1 tablet Oral QHS  . mupirocin ointment  1 application Nasal BID

## 2016-11-15 NOTE — Progress Notes (Addendum)
PROGRESS NOTE                                                                                                                                                                                                             Patient Demographics:    Debbie [name], is a 83 y.o. female, DOB - 04/19/1931, CWC:[id number]  Admit date - 11/13/2016   Admitting Physician [name], MD  Outpatient Primary MD for the patient is [name], MD  LOS - 1  Chief Complaint  Patient presents with  . Rectal Bleeding       Brief Narrative   83 y.o. female, With past medical history of COPD, diastolic CHF, diabetes, end-stage renal disease on hemodialysis Monday Wednesday Friday, started last February, presents from home with multiple complaints, generalized weakness, and bright red blood per rectum, and abdominal cramps, patient reports symptoms for last 2 weeks, Rectal bleeding thought to be secondary to hemorrhoid, patient was significantly hypotensive for which she was admitted for further workup.   Subjective:    [name] today Denies any complaints,Reports she had a good night sleep, she denies any dizziness, chest pain or shortness of breath,.     Assessment  & Plan :    Active Problems:   Diabetes mellitus with complication (HCC)   Diastolic heart failure (HCC)   Hypotension   Bright red blood per rectum  Hypotension - As discussed with renal, this appears to be chronic problem, and she usually runs low blood pressure during dialysis once they compared treating at hemodialysis center , she is totally asymptomatic with these low numbers during hospital stay , she was started on midodrine, with good response, her average systolic blood pressure in the 80s to 90s , so Lowella Petties will be continued on discharge, as with renal, there is no need to change her UF, or EDW . - Blood cultures with no growth to date  Rectal bleeding - Is most likely  related to internal hemorrhoid bleed, hemoglobin remained stable, it is 9.7 on discharge, which is around her baseline, by reviewing records, hemoglobin has been between 6 and 7 in February where she required blood transfusions .  Anemia - Anemia of chronic kidney disease, on erythropoietin as an outpatient  Pleural effusion - CT abdomen pelvis with evidence of right large pleural effusion, status post  thoracentesis on 6/9 with 1.1 L drained, clear yellow fluid, appears to be transudate as total protein is 3, LD is 45, follow on cytology, Gram stain and culture  Abdominal cramp  - Resolved, no acute finding in CT abdomen and pelvis  Hypokalemia - Repleted, to be managed with dialysis  Right heel pressure ulcer/osteomyelitis - Wound care consulted, - Discussed with wound care physician Dr.Brrrito, there was no culture sent, discussed with ID Dr Linus Salmons, will treat empirically on by mouth doxycycline and cefazolin after dialysis for total of 6 weeks , as trying to avoid vancomycin as patient is still making urine , discussed with renal time of discharge, they will arrange for IV cefazolin after dialysis.  ESRD - Consulted, for dialysis today - She is still making urine, creatinine is 2.3 day of discharge  A. Fib - Heart rate controlled, continue with aspirin , currently not a candidate for anti-coagulation currently giving rectal bleed , this can be considered in the future when she is stable.  Chronic diastolic CHF - Appears to be euvolemic, volume management with dialysis  Diabetes mellitus - Managed with diet  Generalized weakness - Seen by PT in ED, will need SNF placement,    Code Status : Full  Family Communication  : None at bedside  Disposition Plan  : We'll need SNF placement  Consults  :  Renal, discussed with ID and wound care physician via phone  Procedures  : Ultrasound-guided thoracentesis with 1.1 L drained on 6/9  DVT Prophylaxis  :   SCDs   Lab  Results  Component Value Date   PLT 233 11/15/2016    Antibiotics  :    Anti-infectives    Start     Dose/Rate Route Frequency Ordered Stop   11/17/16 0000  ceFAZolin (ANCEF) 2-4 GM/100ML-% IVPB     2 g 200 mL/hr over 30 Minutes Intravenous Every M-W-F (2000) 11/15/16 1355 12/25/16 2359   11/15/16 0000  doxycycline (VIBRA-TABS) 100 MG tablet     100 mg Oral Every 12 hours 11/15/16 1355 12/25/16 2359   11/14/16 2000  ceFAZolin (ANCEF) IVPB 2g/100 mL premix     2 g 200 mL/hr over 30 Minutes Intravenous Every M-W-F (2000) 11/14/16 1320     11/14/16 1300  doxycycline (VIBRA-TABS) tablet 100 mg     100 mg Oral Every 12 hours 11/14/16 1227          Objective:   Vitals:   11/15/16 1150 11/15/16 1151 11/15/16 1156 11/15/16 1158  BP: 90/71 (!) 83/59 (!) 88/63 (!) 82/62  Pulse:      Resp:      Temp:      TempSrc:      SpO2:      Weight:      Height:        Wt Readings from Last 3 Encounters:  11/14/16 65.7 kg (144 lb 13.5 oz)  09/17/16 72.5 kg (159 lb 12.8 oz)  09/03/16 72.6 kg (160 lb)     Intake/Output Summary (Last 24 hours) at 11/15/16 1355 Last data filed at 11/15/16 1316  Gross per 24 hour  Intake              580 ml  Output                0 ml  Net              580 ml     Physical Exam  Awake Alert, Oriented X 3,  No JVD, supple neck Symmetrical Chest wall movement, Diminished air entry at the bases irr irr ,No  new Murmurs, No Parasternal Heave +ve B.Sounds, Abd Soft, No tenderness,  No rebound - guarding or rigidity. No Cyanosis, Clubbing or edema, right heel ulcer, non-toxic appearing    Data Review:    CBC  Recent Labs Lab 11/13/16 1055 11/14/16 0402 11/15/16 0629  WBC 8.1 8.6 8.6  HGB 10.4* 9.6* 9.7*  HCT 33.5* 31.2* 31.7*  PLT 263 260 233  MCV 99.4 100.0 99.7  MCH 30.9 30.8 30.5  MCHC 31.0 30.8 30.6  RDW 16.5* 16.6* 16.8*    Chemistries   Recent Labs Lab 11/13/16 1055 11/14/16 0402 11/15/16 0629  NA 137 133* 133*  K 2.9*  3.5 4.0  CL 95* 95* 97*  CO2 31 29 27   GLUCOSE 155* 104* 132*  BUN 11 17 13   CREATININE 2.72* 3.52* 2.32*  CALCIUM 8.8* 8.7* 8.6*  AST 20  --   --   ALT 10*  --   --   ALKPHOS 82  --   --   BILITOT 0.9  --   --    ------------------------------------------------------------------------------------------------------------------ No results for input(s): CHOL, HDL, LDLCALC, TRIG, CHOLHDL, LDLDIRECT in the last 72 hours.  Lab Results  Component Value Date   HGBA1C 5.4 05/28/2016   ------------------------------------------------------------------------------------------------------------------ No results for input(s): TSH, T4TOTAL, T3FREE, THYROIDAB in the last 72 hours.  Invalid input(s): FREET3 ------------------------------------------------------------------------------------------------------------------ No results for input(s): VITAMINB12, FOLATE, FERRITIN, TIBC, IRON, RETICCTPCT in the last 72 hours.  Coagulation profile No results for input(s): INR, PROTIME in the last 168 hours.  No results for input(s): DDIMER in the last 72 hours.  Cardiac Enzymes No results for input(s): CKMB, TROPONINI, MYOGLOBIN in the last 168 hours.  Invalid input(s): CK ------------------------------------------------------------------------------------------------------------------    Component Value Date/Time   BNP 690.8 (H) 06/08/2016 0751    Inpatient Medications  Scheduled Meds: . atorvastatin  20 mg Oral Daily  . Chlorhexidine Gluconate Cloth  6 each Topical Q0600  . donepezil  5 mg Oral QHS  . doxycycline  100 mg Oral Q12H  . feeding supplement (PRO-STAT SUGAR FREE 64)  30 mL Oral BID  . lidocaine      . midodrine  10 mg Oral BID WC  . multivitamin  1 tablet Oral QHS  . mupirocin ointment  1 application Nasal BID   Continuous Infusions: . sodium chloride    .  ceFAZolin (ANCEF) IV Stopped (11/14/16 2140)   PRN Meds:.acetaminophen **OR** acetaminophen, albuterol, sodium  chloride  Micro Results Recent Results (from the past 240 hour(s))  Culture, blood (Routine X 2) w Reflex to ID Panel     Status: None (Preliminary result)   Collection Time: 11/13/16  8:05 PM  Result Value Ref Range Status   Specimen Description BLOOD LEFT HAND  Final   Special Requests IN PEDIATRIC BOTTLE Blood Culture adequate volume  Final   Culture NO GROWTH 2 DAYS  Final   Report Status PENDING  Incomplete  Culture, blood (Routine X 2) w Reflex to ID Panel     Status: None (Preliminary result)   Collection Time: 11/13/16  8:09 PM  Result Value Ref Range Status   Specimen Description BLOOD LEFT ANTECUBITAL  Final   Special Requests IN PEDIATRIC BOTTLE Blood Culture adequate volume  Final   Culture NO GROWTH 2 DAYS  Final   Report Status PENDING  Incomplete  MRSA PCR Screening     Status: Abnormal  Collection Time: 11/14/16  7:03 AM  Result Value Ref Range Status   MRSA by PCR POSITIVE (A) NEGATIVE Final    Comment:        The GeneXpert MRSA Assay (FDA approved for NASAL specimens only), is one component of a comprehensive MRSA colonization surveillance program. It is not intended to diagnose MRSA infection nor to guide or monitor treatment for MRSA infections. RESULT CALLED TO, READ BACK BY AND VERIFIED WITH: C.YAP RN AT [phone] 11/14/16 BY A.DAVIS     Radiology Reports Ct Abdomen Pelvis Wo Contrast  Result Date: 11/13/2016 CLINICAL DATA:  Abdominal pain EXAM: CT ABDOMEN AND PELVIS WITHOUT CONTRAST TECHNIQUE: Multidetector CT imaging of the abdomen and pelvis was performed following the standard protocol without IV contrast. COMPARISON:  CT abdomen pelvis 12/17/2011 PET CT 01/16/2016 FINDINGS: Lower chest: Large right and medium-sized left pleural effusions with associated atelectasis. Hepatobiliary: Normal hepatic size and contours. No perihepatic ascites. No intra- or extrahepatic biliary dilatation. There is cholelithiasis without evidence of inflammation. Pancreas: Normal  pancreatic contours. No peripancreatic fluid collection or pancreatic ductal dilatation. Spleen: Normal. Adrenals/Urinary Tract: Normal adrenal glands. Bilateral renal cortical atrophy and extensive renal vascular calcification, especially on the right. No hydronephrosis. Left-greater-than-right perinephric stranding. Stomach/Bowel: No hiatal hernia. The stomach and duodenum are unremarkable. No dilated small bowel. There is a large ventral abdominal hernia of the contains multiple small bowel loops, but there is no evidence of acute inflammation or obstruction. There is sigmoid diverticulosis without acute diverticulitis. Appendix is not clearly visualized. Vascular/Lymphatic: There is atherosclerotic calcification of the non aneurysmal abdominal aorta. There is extensive atherosclerotic calcification within the aortic branch vessels. No abdominal or pelvic adenopathy. Reproductive: Status post hysterectomy.  No adnexal mass. Musculoskeletal: Multilevel thoracolumbar osteophytosis. No bony spinal canal stenosis. Anasarca. Other: No contributory non-categorized findings. IMPRESSION: 1. Large ventral abdominal hernia containing a large amount small bowel without evidence of obstruction or acute inflammation, unchanged from 01/16/2016. 2. Large right and medium-sized left pleural effusions. 3. Rectosigmoid diverticulosis without acute inflammation. Apparent wall thickening is favored to be due to underdistention. 4. Bilateral renal atrophy. 5. Atherosclerosis of the aorta and major abdominal branch vessels. Electronically Signed   By: Ulyses Jarred M.D.   On: 11/13/2016 21:05   Dg Chest 1 View  Result Date: 11/15/2016 CLINICAL DATA:  Status post right thoracentesis. EXAM: CHEST 1 VIEW COMPARISON:  07/17/2016 and prior chest radiographs. 11/13/2016 abdominal CT FINDINGS: Cardiomegaly again noted. Decreased right pleural effusion identified. There is no evidence of pneumothorax. Small left pleural effusion and left  lower lung atelectasis again noted. A right IJ central venous catheter with tip overlying the superior cavoatrial junction noted. IMPRESSION: Decreased right pleural effusion status post thoracentesis. No evidence of pneumothorax. Unchanged small left pleural effusion and left lower lung atelectasis. Electronically Signed   By: Margarette Canada M.D.   On: 11/15/2016 12:49   US Thoracentesis Asp Pleural Space W/img Guide  Result Date: 11/15/2016 INDICATION: Patient with right-sided pleural effusion. Request is made for diagnostic and therapeutic thoracentesis. EXAM: ULTRASOUND GUIDED DIAGNOSTIC AND THERAPEUTIC RIGHT THORACENTESIS MEDICATIONS: 10 mL 1% lidocaine COMPLICATIONS: None immediate. PROCEDURE: An ultrasound guided thoracentesis was thoroughly discussed with the patient and questions answered. The benefits, risks, alternatives and complications were also discussed. The patient understands and wishes to proceed with the procedure. Written consent was obtained. Ultrasound was performed to localize and mark an adequate pocket of fluid in the right chest. The area was then prepped and draped in the normal sterile  fashion. 1% Lidocaine was used for local anesthesia. Under ultrasound guidance a Safe-T-centesis catheter was introduced. Thoracentesis was performed. The catheter was removed and a dressing applied. FINDINGS: A total of approximately 1.1 liters of clear, yellow fluid was removed. Samples were sent to the laboratory as requested by the clinical team. IMPRESSION: Successful ultrasound guided diagnostic and therapeutic right thoracentesis yielding 1.1 liters of pleural fluid. Read by:  Brynda Greathouse PA-C Electronically Signed   By: Marybelle Killings M.D.   On: 11/15/2016 12:05     ELGERGAWY, DAWOOD M.D on 11/15/2016 at 1:55 PM  Between 7am to 7pm - Pager - [phone]  After 7pm go to [email or web address] - password [facility]  [facility] -  Office  [phone]

## 2016-11-15 NOTE — Progress Notes (Signed)
MD notified of bp. L lower leg provides best bp. Will continue to monitor. Bartholomew Crews, RN

## 2016-11-18 LAB — CULTURE, BLOOD (ROUTINE X 2)
CULTURE: NO GROWTH
CULTURE: NO GROWTH
SPECIAL REQUESTS: ADEQUATE
Special Requests: ADEQUATE

## 2016-11-20 LAB — CULTURE, BODY FLUID W GRAM STAIN -BOTTLE: Culture: NO GROWTH

## 2016-11-20 LAB — CULTURE, BODY FLUID-BOTTLE

## 2016-12-04 ENCOUNTER — Ambulatory Visit
Admission: RE | Admit: 2016-12-04 | Discharge: 2016-12-04 | Disposition: A | Discharge status: Home / Self Care | Payer: Medicare HMO | Source: Ambulatory Visit | Attending: Family Medicine | Admitting: Family Medicine

## 2016-12-04 ENCOUNTER — Other Ambulatory Visit: Payer: Medicare HMO

## 2016-12-04 DIAGNOSIS — R413 Other amnesia: Secondary | ICD-10-CM

## 2016-12-05 ENCOUNTER — Other Ambulatory Visit: Payer: Medicare HMO

## 2016-12-09 ENCOUNTER — Other Ambulatory Visit: Payer: Medicare HMO

## 2016-12-15 ENCOUNTER — Other Ambulatory Visit: Payer: Self-pay

## 2016-12-15 DIAGNOSIS — T82510S Breakdown (mechanical) of surgically created arteriovenous fistula, sequela: Secondary | ICD-10-CM

## 2016-12-29 ENCOUNTER — Encounter: Payer: Self-pay | Admitting: Vascular Surgery

## 2017-01-06 ENCOUNTER — Ambulatory Visit (INDEPENDENT_AMBULATORY_CARE_PROVIDER_SITE_OTHER): Payer: Medicare HMO | Admitting: Vascular Surgery

## 2017-01-06 ENCOUNTER — Encounter: Payer: Self-pay | Admitting: Vascular Surgery

## 2017-01-06 ENCOUNTER — Ambulatory Visit (HOSPITAL_COMMUNITY)
Admission: RE | Admit: 2017-01-06 | Discharge: 2017-01-06 | Disposition: A | Discharge status: Home / Self Care | Payer: Medicare HMO | Source: Ambulatory Visit | Attending: Vascular Surgery | Admitting: Vascular Surgery

## 2017-01-06 ENCOUNTER — Other Ambulatory Visit: Payer: Self-pay

## 2017-01-06 VITALS — BP 99/63 | HR 68 | Temp 97.1°F | Resp 16 | Ht 65.5 in | Wt 152.0 lb

## 2017-01-06 DIAGNOSIS — Z992 Dependence on renal dialysis: Secondary | ICD-10-CM | POA: Diagnosis not present

## 2017-01-06 DIAGNOSIS — Y838 Other surgical procedures as the cause of abnormal reaction of the patient, or of later complication, without mention of misadventure at the time of the procedure: Secondary | ICD-10-CM | POA: Diagnosis not present

## 2017-01-06 DIAGNOSIS — I770 Arteriovenous fistula, acquired: Secondary | ICD-10-CM | POA: Diagnosis not present

## 2017-01-06 DIAGNOSIS — N186 End stage renal disease: Secondary | ICD-10-CM

## 2017-01-06 DIAGNOSIS — T82510S Breakdown (mechanical) of surgically created arteriovenous fistula, sequela: Secondary | ICD-10-CM | POA: Insufficient documentation

## 2017-01-06 NOTE — Progress Notes (Signed)
[facility] [place]  Patient name: Debbie [name] MRN: [id number] DOB: [birth year] Sex: female  REASON FOR VISIT: Right upper arm AV fistula  HPI: Debbie [name] is a 83 y.o. female is post right upper arm AV fistula creation by Dr. Scot Dock on 07/27/2016. He does seen her back proximally one month following this with the good Domingos Riggi maturation. She continues to dialyze via a right IJ catheter. Her fistula has failed to continue to mature and does is on available for dialysis access. We are seeing her again for further follow-up  Past Medical History:  Diagnosis Date  . Allergy   . Anxiety   . Arthritis   . Cancer ([place]) dx'd 12/2007   endometroid adenocarcinoma  . Cataract    both  . Diabetes mellitus    fasting [id number]  . Dizziness   . History of radiation therapy 9/21,9/28,10/05,05/20/2008   4 txs 2400 cGy endometrial adenocarcinoma  . Hypertension   . Peripheral vascular disease ([place])   . Seasonal allergies   . 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 UTI (urinary tract infection) 05/2016    Family History  Problem Relation Age of Onset  . Actinic keratosis Mother   . Cancer Mother        uterine  . Heart disease Father   . Hyperlipidemia Father   . Hypertension Father   . Stroke Father   . Cancer Sister        lung    SOCIAL HISTORY: Social History  Substance Use Topics  . Smoking status: Never Smoker  . Smokeless tobacco: Never Used  . Alcohol use No    Allergies  Allergen Reactions  . Codeine Other (See Comments)    Unusual mouth sensation    Current Outpatient Prescriptions  Medication Sig Dispense Refill  . Albuterol Sulfate (PROAIR RESPICLICK) 115 (90 Base) MCG/ACT AEPB Inhale 1-2 puffs into the lungs every 4 (four) hours as needed. 1 each 1  . Amino Acids-Protein Hydrolys (FEEDING SUPPLEMENT, PRO-STAT SUGAR FREE 64,) LIQD Take 30 mLs by mouth 2 (two) times daily. 900 mL 0    . aspirin EC 81 MG tablet Take 81 mg by mouth daily.    [name] atorvastatin (LIPITOR) 20 MG tablet Take 20 mg by mouth daily.    . B Complex-C-Folic Acid (NEPHRO-VITE PO) Take by mouth.    . donepezil (ARICEPT) 5 MG tablet Take 5 mg by mouth at bedtime.    [name] guaiFENesin (MUCINEX) 600 MG 12 hr tablet Take 1 tablet (600 mg total) by mouth 2 (two) times daily as needed. (Patient taking differently: Take 600 mg by mouth 2 (two) times daily. ) 30 tablet 0  . midodrine (PROAMATINE) 10 MG tablet Take 1 tablet (10 mg total) by mouth 2 (two) times daily with a meal.    . Multiple Vitamin (MULTIVITAMIN) tablet Take 1 tablet by mouth daily. Centrum silver    . saccharomyces boulardii (FLORASTOR) 250 MG capsule Take 1 capsule (250 mg total) by mouth 2 (two) times daily.    [name] senna-docusate (SENOKOT-S) 8.6-50 MG tablet Take 2 tablets by mouth 2 (two) times daily. 30 tablet 1  . sodium chloride (OCEAN) 0.65 % SOLN nasal spray Place 1 spray into both nostrils as needed for congestion.     No current facility-administered medications for this visit.     REVIEW OF SYSTEMS:  [X]  denotes positive finding, [ ]  denotes negative finding Cardiac  Comments:  Chest pain  or chest pressure:    Shortness of breath upon exertion:    Short of breath when lying flat:    Irregular heart rhythm:        Vascular    Pain in calf, thigh, or hip brought on by ambulation:    Pain in feet at night that wakes you up from your sleep:     Blood clot in your veins:    Leg swelling:           PHYSICAL EXAM: Vitals:   01/06/17 1206  BP: 99/63  Pulse: 68  Resp: 16  Temp: (!) 97.1 F (36.2 C)  SpO2: 96%  Weight: 152 lb (68.9 kg)  Height: 5' 5.5" (1.664 m)    GENERAL: The patient is a well-nourished female, in no acute distress. The vital signs are documented above. CARDIOVASCULAR: 2+ right radial pulse. She does have an excellent thrill in the lower portion of her fistula at the antecubital space with good vein  maturation. The vein above this level is palpable but much diminished thrill present at this level. PULMONARY: There is good air exchange  MUSCULOSKELETAL: There are no major deformities or cyanosis. NEUROLOGIC: No focal weakness or paresthesias are detected. SKIN: There are no ulcers or rashes noted. PSYCHIATRIC: The patient has a normal affect.  DATA:  Duplex today shows reasonable size throughout the course of her right upper arm cephalic vein fistula  MEDICAL ISSUES: Failure to mature of her fistula. It does appear to be quite nicely at the antecubital space and just above this. Have recommended fistulogram for further evaluation. Discussed this with the patient understands. She currently undergoes hemodialysis on Monday Wednesday Friday so therefore we'll schedule this as an outpatient on Tuesday or Thursday.    Rosetta Posner, MD FACS [facility] Tel [phone] Pager [phone]

## 2017-01-07 DIAGNOSIS — K625 Hemorrhage of anus and rectum: Secondary | ICD-10-CM

## 2017-01-07 HISTORY — DX: Hemorrhage of anus and rectum: K62.5

## 2017-01-13 ENCOUNTER — Encounter (HOSPITAL_COMMUNITY)
Admission: RE | Disposition: A | Discharge status: Home / Self Care | Payer: Self-pay | Source: Ambulatory Visit | Attending: Surgery

## 2017-01-13 ENCOUNTER — Other Ambulatory Visit: Payer: Self-pay | Admitting: *Deleted

## 2017-01-13 ENCOUNTER — Ambulatory Visit (HOSPITAL_COMMUNITY)
Admission: RE | Admit: 2017-01-13 | Discharge: 2017-01-13 | Disposition: A | Discharge status: Home / Self Care | Payer: Medicare HMO | Source: Ambulatory Visit | Attending: Surgery | Admitting: Surgery

## 2017-01-13 ENCOUNTER — Encounter (HOSPITAL_COMMUNITY): Payer: Self-pay | Admitting: Surgery

## 2017-01-13 DIAGNOSIS — Z4901 Encounter for fitting and adjustment of extracorporeal dialysis catheter: Secondary | ICD-10-CM

## 2017-01-13 DIAGNOSIS — T82898A Other specified complication of vascular prosthetic devices, implants and grafts, initial encounter: Secondary | ICD-10-CM | POA: Diagnosis not present

## 2017-01-13 DIAGNOSIS — M1990 Unspecified osteoarthritis, unspecified site: Secondary | ICD-10-CM | POA: Insufficient documentation

## 2017-01-13 DIAGNOSIS — Z8249 Family history of ischemic heart disease and other diseases of the circulatory system: Secondary | ICD-10-CM

## 2017-01-13 DIAGNOSIS — I120 Hypertensive chronic kidney disease with stage 5 chronic kidney disease or end stage renal disease: Secondary | ICD-10-CM

## 2017-01-13 DIAGNOSIS — Z923 Personal history of irradiation: Secondary | ICD-10-CM

## 2017-01-13 DIAGNOSIS — E1122 Type 2 diabetes mellitus with diabetic chronic kidney disease: Secondary | ICD-10-CM | POA: Insufficient documentation

## 2017-01-13 DIAGNOSIS — F419 Anxiety disorder, unspecified: Secondary | ICD-10-CM | POA: Insufficient documentation

## 2017-01-13 DIAGNOSIS — Z7982 Long term (current) use of aspirin: Secondary | ICD-10-CM

## 2017-01-13 DIAGNOSIS — N186 End stage renal disease: Secondary | ICD-10-CM

## 2017-01-13 DIAGNOSIS — K5731 Diverticulosis of large intestine without perforation or abscess with bleeding: Secondary | ICD-10-CM | POA: Diagnosis not present

## 2017-01-13 DIAGNOSIS — E1151 Type 2 diabetes mellitus with diabetic peripheral angiopathy without gangrene: Secondary | ICD-10-CM | POA: Insufficient documentation

## 2017-01-13 DIAGNOSIS — K922 Gastrointestinal hemorrhage, unspecified: Secondary | ICD-10-CM | POA: Diagnosis not present

## 2017-01-13 HISTORY — PX: A/V FISTULAGRAM: CATH118298

## 2017-01-13 LAB — POCT I-STAT, CHEM 8
BUN: 26 mg/dL — AB (ref 6–20)
CALCIUM ION: 1.07 mmol/L — AB (ref 1.15–1.40)
Chloride: 100 mmol/L — ABNORMAL LOW (ref 101–111)
Creatinine, Ser: 3.2 mg/dL — ABNORMAL HIGH (ref 0.44–1.00)
Glucose, Bld: 99 mg/dL (ref 65–99)
HEMATOCRIT: 39 % (ref 36.0–46.0)
Hemoglobin: 13.3 g/dL (ref 12.0–15.0)
Potassium: 4.1 mmol/L (ref 3.5–5.1)
SODIUM: 137 mmol/L (ref 135–145)
TCO2: 29 mmol/L (ref 0–100)

## 2017-01-13 SURGERY — A/V FISTULAGRAM
Anesthesia: LOCAL | Laterality: Right

## 2017-01-13 MED ORDER — GUAIFENESIN-DM 100-10 MG/5ML PO SYRP: 15.0000 mL | ORAL_SOLUTION | ORAL | Status: DC | PRN

## 2017-01-13 MED ORDER — MIDAZOLAM HCL 2 MG/2ML IJ SOLN
INTRAMUSCULAR | Status: DC | PRN
Start: 2017-01-13 — End: 2017-01-13
  Administered 2017-01-13: 1 mg via INTRAVENOUS

## 2017-01-13 MED ORDER — ACETAMINOPHEN 325 MG PO TABS: 325.0000 mg | ORAL_TABLET | ORAL | Status: DC | PRN

## 2017-01-13 MED ORDER — FENTANYL CITRATE (PF) 100 MCG/2ML IJ SOLN
INTRAMUSCULAR | Status: DC | PRN
  Administered 2017-01-13: 25 ug via INTRAVENOUS

## 2017-01-13 MED ORDER — ALUM & MAG HYDROXIDE-SIMETH 200-200-20 MG/5ML PO SUSP: 15.0000 mL | ORAL | Status: DC | PRN

## 2017-01-13 MED ORDER — LIDOCAINE HCL (PF) 1 % IJ SOLN
INTRAMUSCULAR | Status: AC
  Filled 2017-01-13: qty 30

## 2017-01-13 MED ORDER — HEPARIN (PORCINE) IN NACL 2-0.9 UNIT/ML-% IJ SOLN
INTRAMUSCULAR | Status: AC | PRN
  Administered 2017-01-13: 500 mL

## 2017-01-13 MED ORDER — METOPROLOL TARTRATE 5 MG/5ML IV SOLN: 2.0000 mg | INTRAVENOUS | Status: DC | PRN

## 2017-01-13 MED ORDER — MIDAZOLAM HCL 2 MG/2ML IJ SOLN
INTRAMUSCULAR | Status: AC
  Filled 2017-01-13: qty 2

## 2017-01-13 MED ORDER — DOCUSATE SODIUM 100 MG PO CAPS: 100.0000 mg | ORAL_CAPSULE | Freq: Every day | ORAL | Status: DC

## 2017-01-13 MED ORDER — HEPARIN (PORCINE) IN NACL 2-0.9 UNIT/ML-% IJ SOLN
INTRAMUSCULAR | Status: AC
  Filled 2017-01-13: qty 500

## 2017-01-13 MED ORDER — LIDOCAINE HCL (PF) 1 % IJ SOLN
INTRAMUSCULAR | Status: DC | PRN
  Administered 2017-01-13: 4 mL

## 2017-01-13 MED ORDER — ACETAMINOPHEN 325 MG RE SUPP: 325.0000 mg | RECTAL | Status: DC | PRN

## 2017-01-13 MED ORDER — HYDRALAZINE HCL 20 MG/ML IJ SOLN: 5.0000 mg | INTRAMUSCULAR | Status: DC | PRN

## 2017-01-13 MED ORDER — ONDANSETRON HCL 4 MG/2ML IJ SOLN: 4.0000 mg | Freq: Four times a day (QID) | INTRAMUSCULAR | Status: DC | PRN

## 2017-01-13 MED ORDER — FENTANYL CITRATE (PF) 100 MCG/2ML IJ SOLN
INTRAMUSCULAR | Status: AC
  Filled 2017-01-13: qty 2

## 2017-01-13 MED ORDER — SODIUM CHLORIDE 0.9% FLUSH: 3.0000 mL | INTRAVENOUS | Status: DC | PRN

## 2017-01-13 MED ORDER — LABETALOL HCL 5 MG/ML IV SOLN: 10.0000 mg | INTRAVENOUS | Status: DC | PRN

## 2017-01-13 MED ORDER — IODIXANOL 320 MG/ML IV SOLN
INTRAVENOUS | Status: DC | PRN
Start: 2017-01-13 — End: 2017-01-13
  Administered 2017-01-13: 70 mL via INTRAVENOUS

## 2017-01-13 MED ORDER — PHENOL 1.4 % MT LIQD: 1.0000 | OROMUCOSAL | Status: DC | PRN

## 2017-01-13 SURGICAL SUPPLY — 10 items
BAG SNAP BAND KOVER 36X36 (MISCELLANEOUS) ×2 IMPLANT
COVER DOME SNAP 22 D (MISCELLANEOUS) ×2 IMPLANT
COVER PRB 48X5XTLSCP FOLD TPE (BAG) ×1 IMPLANT
COVER PROBE 5X48 (BAG) ×2
KIT MICROINTRODUCER STIFF 5F (SHEATH) ×1 IMPLANT
PROTECTION STATION PRESSURIZED (MISCELLANEOUS) ×2
STATION PROTECTION PRESSURIZED (MISCELLANEOUS) ×1 IMPLANT
STOPCOCK MORSE 400PSI 3WAY (MISCELLANEOUS) ×3 IMPLANT
TRAY PV CATH (CUSTOM PROCEDURE TRAY) ×2 IMPLANT
TUBING CIL FLEX 10 FLL-RA (TUBING) ×3 IMPLANT

## 2017-01-13 NOTE — H&P (View-Only) (Signed)
[facility] [place]  Patient name: Debbie [name] MRN: [id number] DOB: 08-16-1930 Sex: female  REASON FOR VISIT: Right upper arm AV fistula  HPI: Debbie [name] is a 83 y.o. female is post right upper arm AV fistula creation by Dr. Scot Dock on 07/27/2016. He does seen her back proximally one month following this with the good Debbie [name] maturation. She continues to dialyze via a right IJ catheter. Her fistula has failed to continue to mature and does is on available for dialysis access. We are seeing her again for further follow-up  Past Medical History:  Diagnosis Date  . Allergy   . Anxiety   . Arthritis   . Cancer ([place]) dx'd 12/2007   endometroid adenocarcinoma  . Cataract    both  . Diabetes mellitus    fasting cbgs100-120  . Dizziness   . History of radiation therapy 9/21,9/28,10/05,05/20/2008   4 txs 2400 cGy endometrial adenocarcinoma  . Hypertension   . Peripheral vascular disease ([place])   . Seasonal allergies   . 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 UTI (urinary tract infection) 05/2016    Family History  Problem Relation Age of Onset  . Actinic keratosis Mother   . Cancer Mother        uterine  . Heart disease Father   . Hyperlipidemia Father   . Hypertension Father   . Stroke Father   . Cancer Sister        lung    SOCIAL HISTORY: Social History  Substance Use Topics  . Smoking status: Never Smoker  . Smokeless tobacco: Never Used  . Alcohol use No    Allergies  Allergen Reactions  . Codeine Other (See Comments)    Unusual mouth sensation    Current Outpatient Prescriptions  Medication Sig Dispense Refill  . Albuterol Sulfate (PROAIR RESPICLICK) 093 (90 Base) MCG/ACT AEPB Inhale 1-2 puffs into the lungs every 4 (four) hours as needed. 1 each 1  . Amino Acids-Protein Hydrolys (FEEDING SUPPLEMENT, PRO-STAT SUGAR FREE 64,) LIQD Take 30 mLs by mouth 2 (two) times daily. 900 mL 0    . aspirin EC 81 MG tablet Take 81 mg by mouth daily.    [name] atorvastatin (LIPITOR) 20 MG tablet Take 20 mg by mouth daily.    . B Complex-C-Folic Acid (NEPHRO-VITE PO) Take by mouth.    . donepezil (ARICEPT) 5 MG tablet Take 5 mg by mouth at bedtime.    [name] guaiFENesin (MUCINEX) 600 MG 12 hr tablet Take 1 tablet (600 mg total) by mouth 2 (two) times daily as needed. (Patient taking differently: Take 600 mg by mouth 2 (two) times daily. ) 30 tablet 0  . midodrine (PROAMATINE) 10 MG tablet Take 1 tablet (10 mg total) by mouth 2 (two) times daily with a meal.    . Multiple Vitamin (MULTIVITAMIN) tablet Take 1 tablet by mouth daily. Centrum silver    . saccharomyces boulardii (FLORASTOR) 250 MG capsule Take 1 capsule (250 mg total) by mouth 2 (two) times daily.    [name] senna-docusate (SENOKOT-S) 8.6-50 MG tablet Take 2 tablets by mouth 2 (two) times daily. 30 tablet 1  . sodium chloride (OCEAN) 0.65 % SOLN nasal spray Place 1 spray into both nostrils as needed for congestion.     No current facility-administered medications for this visit.     REVIEW OF SYSTEMS:  [X]  denotes positive finding, [ ]  denotes negative finding Cardiac  Comments:  Chest pain  or chest pressure:    Shortness of breath upon exertion:    Short of breath when lying flat:    Irregular heart rhythm:        Vascular    Pain in calf, thigh, or hip brought on by ambulation:    Pain in feet at night that wakes you up from your sleep:     Blood clot in your veins:    Leg swelling:           PHYSICAL EXAM: Vitals:   01/06/17 1206  BP: 99/63  Pulse: 68  Resp: 16  Temp: (!) 97.1 F (36.2 C)  SpO2: 96%  Weight: 152 lb (68.9 kg)  Height: 5' 5.5" (1.664 m)    GENERAL: The patient is a well-nourished female, in no acute distress. The vital signs are documented above. CARDIOVASCULAR: 2+ right radial pulse. She does have an excellent thrill in the lower portion of her fistula at the antecubital space with good vein  maturation. The vein above this level is palpable but much diminished thrill present at this level. PULMONARY: There is good air exchange  MUSCULOSKELETAL: There are no major deformities or cyanosis. NEUROLOGIC: No focal weakness or paresthesias are detected. SKIN: There are no ulcers or rashes noted. PSYCHIATRIC: The patient has a normal affect.  DATA:  Duplex today shows reasonable size throughout the course of her right upper arm cephalic vein fistula  MEDICAL ISSUES: Failure to mature of her fistula. It does appear to be quite nicely at the antecubital space and just above this. Have recommended fistulogram for further evaluation. Discussed this with the patient understands. She currently undergoes hemodialysis on Monday Wednesday Friday so therefore we'll schedule this as an outpatient on Tuesday or Thursday.    Rosetta Posner, MD FACS [facility] Tel [phone] Pager [phone]

## 2017-01-13 NOTE — Discharge Instructions (Signed)

## 2017-01-13 NOTE — Op Note (Signed)
    Patient name: Debbie [name] MRN: [id number] DOB: 1931-03-21 Sex: female  01/13/2017 Pre-operative Diagnosis: Non-maturing right arm fistula Post-operative diagnosis:  Same Surgeon:  [name] Procedure Performed:  1.  Ultrasound-guided access, right cephalic vein  2.  Fistulogram  3.  Conscious sedation (18 minutes)   Indications:  Patient is having issues with her fistula.  Ultrasound did not show any abnormalities.  She is here for further evaluation  Procedure:  The patient was identified in the holding area and taken to room 8.  The patient was then placed supine on the table and prepped and draped in the usual sterile fashion.  A time out was called.  Conscious sedation was administered with the use of IV fentanyl and Versed in a continuous physician and nurse monitoring.  Heart rate, blood pressure, and oxygen saturation continuously monitored Ultrasound was used to evaluate the fistula.  The vein was patent and compressible.  A digital ultrasound image was acquired.  The fistula was then accessed under ultrasound guidance using a micropuncture needle.  An 018 wire was then asvanced without resistance and a micropuncture sheath was placed.  Contrast injections were then performed through the sheath.  Findings:  No central venous stenosis.  The cephalic vein fistula is widely patent in the upper arm.  There is one large branch just beyond the anastomosis.  The arterial venous anastomosis is widely patent   Intervention:  None  Impression:  #1  the right arm cephalic vein fistula is widely patent with no central venous stenosis  #2  1 competing branches noted just beyond the arterial venous anastomosis Antecubital crease  #3  upon my evaluation with ultrasound, the fistula appears to be approximately 1-2 centimeters below the skin  #4  recommend fistula elevation and branch ligation    V. [name], M.D. [facility] Office:  [phone] Pager:  [phone]

## 2017-01-13 NOTE — Interval H&P Note (Signed)
History and Physical Interval Note:  01/13/2017 7:30 AM  Debbie [name]  has presented today for surgery, with the diagnosis of instage renal  The various methods of treatment have been discussed with the patient and family. After consideration of risks, benefits and other options for treatment, the patient has consented to  Procedure(s): A/V Fistulagram - Right Arm (N/A) as a surgical intervention .  The patient's history has been reviewed, patient examined, no change in status, stable for surgery.  I have reviewed the patient's chart and labs.  Questions were answered to the patient's satisfaction.     Debbie [name]  S:  No complaints.  On HD M,W,F via catheter.  No arm pain or swelling O: Palpable R UE AVF A/P:  ESRD with non-maturing AVF.  Will plan for fistulogram and intervention as indicated.  Risks and benefits discussed with patient including bleeding.  All questions answered.  Debbie [name]

## 2017-01-14 ENCOUNTER — Inpatient Hospital Stay (HOSPITAL_COMMUNITY)
Admission: EM | Admit: 2017-01-14 | Discharge: 2017-01-17 | DRG: 377 | Disposition: A | Discharge status: Skilled Nursing Facility | Payer: Medicare HMO | Attending: Internal Medicine | Admitting: Internal Medicine

## 2017-01-14 ENCOUNTER — Encounter (HOSPITAL_COMMUNITY): Payer: Self-pay | Admitting: Emergency Medicine

## 2017-01-14 DIAGNOSIS — M1990 Unspecified osteoarthritis, unspecified site: Secondary | ICD-10-CM | POA: Diagnosis present

## 2017-01-14 DIAGNOSIS — K5731 Diverticulosis of large intestine without perforation or abscess with bleeding: Secondary | ICD-10-CM

## 2017-01-14 DIAGNOSIS — Z7982 Long term (current) use of aspirin: Secondary | ICD-10-CM

## 2017-01-14 DIAGNOSIS — F0390 Unspecified dementia without behavioral disturbance: Secondary | ICD-10-CM | POA: Diagnosis present

## 2017-01-14 DIAGNOSIS — Z923 Personal history of irradiation: Secondary | ICD-10-CM

## 2017-01-14 DIAGNOSIS — I120 Hypertensive chronic kidney disease with stage 5 chronic kidney disease or end stage renal disease: Secondary | ICD-10-CM | POA: Diagnosis present

## 2017-01-14 DIAGNOSIS — N186 End stage renal disease: Secondary | ICD-10-CM | POA: Diagnosis present

## 2017-01-14 DIAGNOSIS — R0902 Hypoxemia: Secondary | ICD-10-CM | POA: Diagnosis present

## 2017-01-14 DIAGNOSIS — Z8249 Family history of ischemic heart disease and other diseases of the circulatory system: Secondary | ICD-10-CM

## 2017-01-14 DIAGNOSIS — K921 Melena: Secondary | ICD-10-CM | POA: Diagnosis present

## 2017-01-14 DIAGNOSIS — K5730 Diverticulosis of large intestine without perforation or abscess without bleeding: Secondary | ICD-10-CM | POA: Diagnosis not present

## 2017-01-14 DIAGNOSIS — N2581 Secondary hyperparathyroidism of renal origin: Secondary | ICD-10-CM | POA: Diagnosis present

## 2017-01-14 DIAGNOSIS — I4891 Unspecified atrial fibrillation: Secondary | ICD-10-CM | POA: Diagnosis present

## 2017-01-14 DIAGNOSIS — Z66 Do not resuscitate: Secondary | ICD-10-CM | POA: Diagnosis present

## 2017-01-14 DIAGNOSIS — Z885 Allergy status to narcotic agent status: Secondary | ICD-10-CM | POA: Diagnosis not present

## 2017-01-14 DIAGNOSIS — Z79899 Other long term (current) drug therapy: Secondary | ICD-10-CM

## 2017-01-14 DIAGNOSIS — I959 Hypotension, unspecified: Secondary | ICD-10-CM | POA: Diagnosis not present

## 2017-01-14 DIAGNOSIS — E1122 Type 2 diabetes mellitus with diabetic chronic kidney disease: Secondary | ICD-10-CM | POA: Diagnosis present

## 2017-01-14 DIAGNOSIS — J449 Chronic obstructive pulmonary disease, unspecified: Secondary | ICD-10-CM | POA: Diagnosis present

## 2017-01-14 DIAGNOSIS — E118 Type 2 diabetes mellitus with unspecified complications: Secondary | ICD-10-CM | POA: Diagnosis not present

## 2017-01-14 DIAGNOSIS — E1151 Type 2 diabetes mellitus with diabetic peripheral angiopathy without gangrene: Secondary | ICD-10-CM | POA: Diagnosis present

## 2017-01-14 DIAGNOSIS — Z992 Dependence on renal dialysis: Secondary | ICD-10-CM | POA: Diagnosis not present

## 2017-01-14 DIAGNOSIS — Z8542 Personal history of malignant neoplasm of other parts of uterus: Secondary | ICD-10-CM | POA: Diagnosis not present

## 2017-01-14 DIAGNOSIS — F419 Anxiety disorder, unspecified: Secondary | ICD-10-CM | POA: Diagnosis present

## 2017-01-14 DIAGNOSIS — K56699 Other intestinal obstruction unspecified as to partial versus complete obstruction: Secondary | ICD-10-CM | POA: Diagnosis not present

## 2017-01-14 DIAGNOSIS — Z90710 Acquired absence of both cervix and uterus: Secondary | ICD-10-CM | POA: Diagnosis not present

## 2017-01-14 DIAGNOSIS — K922 Gastrointestinal hemorrhage, unspecified: Secondary | ICD-10-CM | POA: Diagnosis present

## 2017-01-14 LAB — COMPREHENSIVE METABOLIC PANEL
ALK PHOS: 79 U/L (ref 38–126)
ALT: 11 U/L — AB (ref 14–54)
AST: 24 U/L (ref 15–41)
Albumin: 2.9 g/dL — ABNORMAL LOW (ref 3.5–5.0)
Anion gap: 10 (ref 5–15)
BILIRUBIN TOTAL: 0.6 mg/dL (ref 0.3–1.2)
BUN: 10 mg/dL (ref 6–20)
CALCIUM: 7.9 mg/dL — AB (ref 8.9–10.3)
CHLORIDE: 98 mmol/L — AB (ref 101–111)
CO2: 26 mmol/L (ref 22–32)
CREATININE: 1.84 mg/dL — AB (ref 0.44–1.00)
GFR, EST AFRICAN AMERICAN: 28 mL/min — AB (ref >60)
GFR, EST NON AFRICAN AMERICAN: 24 mL/min — AB (ref >60)
Glucose, Bld: 126 mg/dL — ABNORMAL HIGH (ref 65–99)
Potassium: 3.9 mmol/L (ref 3.5–5.1)
Sodium: 134 mmol/L — ABNORMAL LOW (ref 135–145)
TOTAL PROTEIN: 5.9 g/dL — AB (ref 6.5–8.1)

## 2017-01-14 LAB — CBC
HEMATOCRIT: 35.6 % — AB (ref 36.0–46.0)
HEMOGLOBIN: 11.2 g/dL — AB (ref 12.0–15.0)
MCH: 30.9 pg (ref 26.0–34.0)
MCHC: 31.5 g/dL (ref 30.0–36.0)
MCV: 98.1 fL (ref 78.0–100.0)
Platelets: 207 10*3/uL (ref 150–400)
RBC: 3.63 MIL/uL — AB (ref 3.87–5.11)
RDW: 15.5 % (ref 11.5–15.5)
WBC: 8.9 10*3/uL (ref 4.0–10.5)

## 2017-01-14 LAB — TYPE AND SCREEN
ABO/RH(D): O POS
ANTIBODY SCREEN: NEGATIVE

## 2017-01-14 LAB — CBG MONITORING, ED: GLUCOSE-CAPILLARY: 106 mg/dL — AB (ref 65–99)

## 2017-01-14 LAB — APTT: APTT: 24 s (ref 24–36)

## 2017-01-14 LAB — PROTIME-INR
INR: 1.03
PROTHROMBIN TIME: 13.5 s (ref 11.4–15.2)

## 2017-01-14 MED ORDER — MIDODRINE HCL 5 MG PO TABS: 10.0000 mg | ORAL_TABLET | Freq: Two times a day (BID) | ORAL | Status: DC

## 2017-01-14 MED ORDER — ONDANSETRON HCL 4 MG/2ML IJ SOLN: 4.0000 mg | Freq: Four times a day (QID) | INTRAMUSCULAR | Status: DC | PRN

## 2017-01-14 MED ORDER — GUAIFENESIN ER 600 MG PO TB12
600.0000 mg | ORAL_TABLET | Freq: Two times a day (BID) | ORAL | Status: DC
Start: 2017-01-14 — End: 2017-01-17
  Administered 2017-01-14 – 2017-01-17 (×6): 600 mg via ORAL
  Filled 2017-01-14 (×6): qty 1

## 2017-01-14 MED ORDER — MIDODRINE HCL 5 MG PO TABS
10.0000 mg | ORAL_TABLET | Freq: Two times a day (BID) | ORAL | Status: DC
  Administered 2017-01-15 – 2017-01-17 (×5): 10 mg via ORAL
  Filled 2017-01-14 (×5): qty 2

## 2017-01-14 MED ORDER — ATORVASTATIN CALCIUM 20 MG PO TABS
20.0000 mg | ORAL_TABLET | Freq: Every evening | ORAL | Status: DC
  Administered 2017-01-14 – 2017-01-16 (×3): 20 mg via ORAL
  Filled 2017-01-14 (×3): qty 1

## 2017-01-14 MED ORDER — ALBUTEROL SULFATE (2.5 MG/3ML) 0.083% IN NEBU: 3.0000 mL | INHALATION_SOLUTION | RESPIRATORY_TRACT | Status: DC | PRN

## 2017-01-14 MED ORDER — PRO-STAT SUGAR FREE PO LIQD
30.0000 mL | Freq: Two times a day (BID) | ORAL | Status: DC
  Administered 2017-01-15 – 2017-01-17 (×5): 30 mL via ORAL
  Filled 2017-01-14 (×7): qty 30

## 2017-01-14 MED ORDER — ONDANSETRON HCL 4 MG PO TABS: 4.0000 mg | ORAL_TABLET | Freq: Four times a day (QID) | ORAL | Status: DC | PRN

## 2017-01-14 MED ORDER — ACETAMINOPHEN 325 MG PO TABS: 650.0000 mg | ORAL_TABLET | Freq: Four times a day (QID) | ORAL | Status: DC | PRN

## 2017-01-14 MED ORDER — DONEPEZIL HCL 5 MG PO TABS
5.0000 mg | ORAL_TABLET | Freq: Every day | ORAL | Status: DC
  Administered 2017-01-14 – 2017-01-16 (×3): 5 mg via ORAL
  Filled 2017-01-14 (×3): qty 1

## 2017-01-14 MED ORDER — ASPIRIN EC 81 MG PO TBEC: 81.0000 mg | DELAYED_RELEASE_TABLET | Freq: Every day | ORAL | Status: DC

## 2017-01-14 MED ORDER — ACETAMINOPHEN 650 MG RE SUPP: 650.0000 mg | Freq: Four times a day (QID) | RECTAL | Status: DC | PRN

## 2017-01-14 MED ORDER — SALINE SPRAY 0.65 % NA SOLN: 1.0000 | NASAL | Status: DC | PRN

## 2017-01-14 MED ORDER — SACCHAROMYCES BOULARDII 250 MG PO CAPS
250.0000 mg | ORAL_CAPSULE | Freq: Two times a day (BID) | ORAL | Status: DC
  Administered 2017-01-15 – 2017-01-17 (×5): 250 mg via ORAL
  Filled 2017-01-14 (×5): qty 1

## 2017-01-14 NOTE — ED Triage Notes (Signed)
Per EMS pt [facility] x1 episode bloody BM dark and tarry per staff. H/s of hypotension and rectal bleeding. Restricted R arm due to fistula, right subclavian triple lumen catheter.

## 2017-01-14 NOTE — ED Notes (Signed)
Debbie [name]- (son)- [phone]  [place] (son)- [phone]

## 2017-01-14 NOTE — H&P (Signed)
History and Physical    Debbie [name] EXB:[id number] DOB: 1930-11-10 DOA: 01/14/2017  PCP: [name], MD  Patient coming from: SNF  I have personally briefly reviewed patient's old medical records in [place]  Chief Complaint: Maroon stool  HPI: Debbie [name] is a 83 y.o. female with medical history significant of ESRD dialysis MWF, last dialysis was earlier today.  Patient sent in from [facility] after 1 episode of bloody BM, maroon colored stool.  This occurred at about 5pm.  Patient otherwise asymptomatic.  Her biggest complaint at this time is that she is hungry and she wants to know when she can eat.   ED Course: Maroon stool on rectal exam.  No further bowel movements at this point in ED.   Review of Systems: As per HPI otherwise 10 point review of systems negative.   Past Medical History:  Diagnosis Date  . Allergy   . Anxiety   . Arthritis   . Cancer ([place]) dx'd 12/2007   endometroid adenocarcinoma  . Cataract    both  . Diabetes mellitus    fasting [id number]  . Dizziness   . History of radiation therapy 9/21,9/28,10/05,05/20/2008   4 txs 2400 cGy endometrial adenocarcinoma  . Hypertension   . Peripheral vascular disease ([place])   . Seasonal allergies   . 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 UTI (urinary tract infection) 05/2016    Past Surgical History:  Procedure Laterality Date  . A/V FISTULAGRAM Right 01/13/2017   Procedure: A/V Fistulagram - Right Arm;  Surgeon: Serafina Mitchell, MD;  Location: [facility];  Service: Cardiovascular;  Laterality: Right;  . ABDOMINAL HYSTERECTOMY    . APPENDECTOMY    . AV FISTULA PLACEMENT Left 07/17/2016   Procedure: LEFT ARM ARTERIOVENOUS (AV) FISTULA CREATION;  Surgeon: Angelia Mould, MD;  Location: [facility];  Service: Vascular;  Laterality: Left;  . ENDARTERECTOMY Left 01/12/2014   Procedure: LEFT CAROTID ARTERY ENDARTERECTOMY WITH HEMASHIELD PATCH ANGIOPLASTY;  Surgeon:  Mal Misty, MD;  Location: [facility];  Service: Vascular;  Laterality: Left;  . ENDARTERECTOMY Right 03/17/2014   Procedure: RIGHT CAROTD ARTERY ENDARTERECTOMY WITH DACRON PATCH ANGIOPLASTY;  Surgeon: Mal Misty, MD;  Location: [facility];  Service: Vascular;  Laterality: Right;  . EYE SURGERY Bilateral    cataracts  . INSERTION OF DIALYSIS CATHETER Right 07/17/2016   Procedure: INSERTION OF DIALYSIS CATHETER;  Surgeon: Angelia Mould, MD;  Location: [facility];  Service: Vascular;  Laterality: Right;  . NASAL SEPTUM SURGERY    . NECK SURGERY Bilateral    arterial  . TONSILLECTOMY       reports that she has never smoked. She has never used smokeless tobacco. She reports that she does not drink alcohol or use drugs.  Allergies  Allergen Reactions  . Codeine Other (See Comments)    Unusual mouth sensation    Family History  Problem Relation Age of Onset  . Actinic keratosis Mother   . Cancer Mother        uterine  . Heart disease Father   . Hyperlipidemia Father   . Hypertension Father   . Stroke Father   . Cancer Sister        lung     Prior to Admission medications   Medication Sig Start Date End Date Taking? Authorizing Provider  Albuterol Sulfate (PROAIR RESPICLICK) 102 (90 Base) MCG/ACT AEPB Inhale 1-2 puffs into the lungs every 4 (four) hours  as needed. Patient taking differently: Inhale 1-2 puffs into the lungs every 4 (four) hours as needed (for shortness of breath).  06/02/16   Doreatha Lew, MD  Amino Acids-Protein Hydrolys (FEEDING SUPPLEMENT, PRO-STAT SUGAR FREE 64,) LIQD Take 30 mLs by mouth 2 (two) times daily. Patient taking differently: Take 30 mLs by mouth 2 (two) times daily. (0900 & 1700) 11/15/16   Elgergawy, Silver Huguenin, MD  aspirin EC 81 MG tablet Take 81 mg by mouth daily. (0900)    [provider]  atorvastatin (LIPITOR) 20 MG tablet Take 20 mg by mouth every evening. (1700) 05/26/16   [provider]  B Complex-C-Folic Acid  (NEPHRO-VITE PO) Take 1 tablet by mouth daily. (1700)    [provider]  donepezil (ARICEPT) 5 MG tablet Take 5 mg by mouth at bedtime. (2100)    [provider]  guaiFENesin (MUCINEX) 600 MG 12 hr tablet Take 1 tablet (600 mg total) by mouth 2 (two) times daily as needed. Patient taking differently: Take 600 mg by mouth 2 (two) times daily.  07/19/16   Bonnielee Haff, MD  midodrine (PROAMATINE) 10 MG tablet Take 1 tablet (10 mg total) by mouth 2 (two) times daily with a meal. Patient taking differently: Take 10 mg by mouth 2 (two) times daily with a meal. (0900 & 1700) 11/15/16   Elgergawy, Silver Huguenin, MD  saccharomyces boulardii (FLORASTOR) 250 MG capsule Take 1 capsule (250 mg total) by mouth 2 (two) times daily. Patient taking differently: Take 250 mg by mouth 2 (two) times daily. (0900 & 1700) 11/15/16   Elgergawy, Silver Huguenin, MD  senna-docusate (SENOKOT-S) 8.6-50 MG tablet Take 2 tablets by mouth 2 (two) times daily. Patient taking differently: Take 2 tablets by mouth 2 (two) times daily. (0900 & 1700) 11/15/16 11/15/17  Elgergawy, Silver Huguenin, MD  sodium chloride (OCEAN) 0.65 % SOLN nasal spray Place 1 spray into both nostrils as needed for congestion.    [provider]    Physical Exam: Vitals:   01/14/17 2015 01/14/17 2045 01/14/17 2115 01/14/17 2145  BP: 109/72 111/60 (!) 88/63 98/66  Pulse: 64 67 69 72  Resp: 19 19 19 20   Temp:      TempSrc:      SpO2: 95% 96% 94% 94%    Constitutional: NAD, calm, comfortable Eyes: PERRL, lids and conjunctivae normal ENMT: Mucous membranes are moist. Posterior pharynx clear of any exudate or lesions.Normal dentition.  Neck: normal, supple, no masses, no thyromegaly Respiratory: clear to auscultation bilaterally, no wheezing, no crackles. Normal respiratory effort. No accessory muscle use.  Cardiovascular: Irr, irr no murmurs / rubs / gallops. No extremity edema. 2+ pedal pulses. No carotid bruits.  Abdomen: no tenderness, no  masses palpated. No hepatosplenomegaly. Bowel sounds positive.  Musculoskeletal: no clubbing / cyanosis. No joint deformity upper and lower extremities. Good ROM, no contractures. Normal muscle tone.  Skin: no rashes, lesions, ulcers. No induration Neurologic: CN 2-12 grossly intact. Sensation intact, DTR normal. Strength 5/5 in all 4.  Psychiatric: Normal judgment and insight. Alert and oriented x 3. Normal mood.    Labs on Admission: I have personally reviewed following labs and imaging studies  CBC:  Recent Labs Lab 01/13/17 0742 01/14/17 1823  WBC  --  8.9  HGB 13.3 11.2*  HCT 39.0 35.6*  MCV  --  98.1  PLT  --  161   Basic Metabolic Panel:  Recent Labs Lab 01/13/17 0742 01/14/17 1823  NA 137 134*  K 4.1 3.9  CL 100* 98*  CO2  --  26  GLUCOSE 99 126*  BUN 26* 10  CREATININE 3.20* 1.84*  CALCIUM  --  7.9*   GFR: Estimated Creatinine Clearance: 20.5 mL/min (A) (by C-G formula based on SCr of 1.84 mg/dL (H)). Liver Function Tests:  Recent Labs Lab 01/14/17 1823  AST 24  ALT 11*  ALKPHOS 79  BILITOT 0.6  PROT 5.9*  ALBUMIN 2.9*   No results for input(s): LIPASE, AMYLASE in the last 168 hours. No results for input(s): AMMONIA in the last 168 hours. Coagulation Profile:  Recent Labs Lab 01/14/17 1823  INR 1.03   Cardiac Enzymes: No results for input(s): CKTOTAL, CKMB, CKMBINDEX, TROPONINI in the last 168 hours. BNP (last 3 results) No results for input(s): PROBNP in the last 8760 hours. HbA1C: No results for input(s): HGBA1C in the last 72 hours. CBG:  Recent Labs Lab 01/14/17 2022  GLUCAP 106*   Lipid Profile: No results for input(s): CHOL, HDL, LDLCALC, TRIG, CHOLHDL, LDLDIRECT in the last 72 hours. Thyroid Function Tests: No results for input(s): TSH, T4TOTAL, FREET4, T3FREE, THYROIDAB in the last 72 hours. Anemia Panel: No results for input(s): VITAMINB12, FOLATE, FERRITIN, TIBC, IRON, RETICCTPCT in the last 72 hours. Urine analysis:      Component Value Date/Time   COLORURINE YELLOW 06/26/2016 0028   APPEARANCEUR CLOUDY (A) 06/26/2016 0028   LABSPEC 1.013 06/26/2016 0028   PHURINE 5.0 06/26/2016 0028   GLUCOSEU 50 (A) 06/26/2016 0028   HGBUR NEGATIVE 06/26/2016 0028   BILIRUBINUR NEGATIVE 06/26/2016 0028   KETONESUR NEGATIVE 06/26/2016 0028   PROTEINUR 100 (A) 06/26/2016 0028   UROBILINOGEN 1.0 03/07/2014 1510   NITRITE NEGATIVE 06/26/2016 0028   LEUKOCYTESUR TRACE (A) 06/26/2016 0028    Radiological Exams on Admission: No results found.  EKG: Independently reviewed.  Assessment/Plan Principal Problem:   Hematochezia Active Problems:   Diabetes mellitus with complication (HCC)   COPD with hypoxia (HCC)   Hypotension    1. Hematochezia - 1. EDP trying to call GI to see patient in AM 2. Repeat CBC in AM, transfuse if needed 3. Renal / carb mod diet 2. COPD - continue home nebs 3. DM2 - 1. Diet controlled, on no meds 2. CBG checks Q4H 4. Hypotension (chronic) - continue BID midodrine 5. A.Fib - 1. Rate controlled in 70s currently, not on any rate control meds though 2. Also not on any blood thinners (a good thing at the moment since she is having a GIB).  DVT prophylaxis: SCDs Code Status: DNR Family Communication: Son at bedside Disposition Plan: SNF after admit Consults called: left message with nephrology Admission status: Admit to inpatient   Etta Quill DO [facility] Pager [phone]  If 7AM-7PM, please contact day team taking care of patient [email or web address] Password [facility]  01/14/2017, 9:54 PM

## 2017-01-14 NOTE — ED Provider Notes (Signed)
[facility] DEPT Provider Note   CSN: [id number] Arrival date & time: 01/14/17  1811   Level V caveat dementia  History   Chief Complaint Chief Complaint  Patient presents with  . GI Bleeding   History is obtain from sons who accompany patient and from paperwork who accompanies her. Denies lightheadedness.  HPI Debbie [name] is a 83 y.o. female.  HPI Patient with rectal bleeding onset earlier today. She is asymptomatic. Denies pain anywhere. denies shortness of breath. No treatment prior to coming here Past Medical History:  Diagnosis Date  . Allergy   . Anxiety   . Arthritis   . Cancer ([place]) dx'd 12/2007   endometroid adenocarcinoma  . Cataract    both  . Diabetes mellitus    fasting cbgs100-120  . Dizziness   . History of radiation therapy 9/21,9/28,10/05,05/20/2008   4 txs 2400 cGy endometrial adenocarcinoma  . Hypertension   . Peripheral vascular disease ([place])   . Seasonal allergies   . 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 UTI (urinary tract infection) 05/2016    Patient Active Problem List   Diagnosis Date Noted  . Pleural effusion 11/15/2016  . Hypotension 11/13/2016  . Bright red blood per rectum 11/13/2016  . Central venous catheter in place   . AKI (acute kidney injury) ([place])   . Hyperkalemia   . Atrial fibrillation ([place])   . Bradycardia   . Renal failure 06/25/2016  . COPD exacerbation ([place]) 06/08/2016  . Diastolic heart failure ([place]) 06/08/2016  . Acute respiratory failure with hypoxia ([place])   . COPD with hypoxia ([place]) 06/02/2016  . Pressure injury of skin 05/29/2016  . Respiratory distress 05/29/2016  . UTI (urinary tract infection) 05/28/2016  . Hypoglycemia 05/28/2016  . Diabetes ([place]) 05/28/2016  . Anxiety 05/28/2016  . Hypertension 05/28/2016  . Diabetes mellitus with complication ([place])   . Renal insufficiency   . Urinary tract infection without hematuria   . 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  Carotid stenosis, asymptomatic 11/28/2014  . Aftercare following surgery of the circulatory system 04/11/2014  . Aftercare following surgery of the circulatory system, [place] 01/31/2014  . Carotid stenosis 01/10/2014  . Occlusion and stenosis of carotid artery without mention of cerebral infarction 01/10/2014  . Malignant neoplasm of corpus uteri, except isthmus ([place]) 07/07/2011    Past Surgical History:  Procedure Laterality Date  . A/V FISTULAGRAM Right 01/13/2017   Procedure: A/V Fistulagram - Right Arm;  Surgeon: Serafina Mitchell, MD;  Location: [facility];  Service: Cardiovascular;  Laterality: Right;  . ABDOMINAL HYSTERECTOMY    . APPENDECTOMY    . AV FISTULA PLACEMENT Left 07/17/2016   Procedure: LEFT ARM ARTERIOVENOUS (AV) FISTULA CREATION;  Surgeon: Angelia Mould, MD;  Location: [employer] [facility];  Service: Vascular;  Laterality: Left;  . ENDARTERECTOMY Left 01/12/2014   Procedure: LEFT CAROTID ARTERY ENDARTERECTOMY WITH HEMASHIELD PATCH ANGIOPLASTY;  Surgeon: Mal Misty, MD;  Location: [facility];  Service: Vascular;  Laterality: Left;  . ENDARTERECTOMY Right 03/17/2014   Procedure: RIGHT CAROTD ARTERY ENDARTERECTOMY WITH DACRON PATCH ANGIOPLASTY;  Surgeon: Mal Misty, MD;  Location: [facility];  Service: Vascular;  Laterality: Right;  . EYE SURGERY Bilateral    cataracts  . INSERTION OF DIALYSIS CATHETER Right 07/17/2016   Procedure: INSERTION OF DIALYSIS CATHETER;  Surgeon: Angelia Mould, MD;  Location: [facility];  Service: Vascular;  Laterality: Right;  . NASAL SEPTUM SURGERY    .  NECK SURGERY Bilateral    arterial  . TONSILLECTOMY      OB History    No data available       Home Medications    Prior to Admission medications   Medication Sig Start Date End Date Taking? Authorizing Provider  Albuterol Sulfate (PROAIR RESPICLICK) 841 (90 Base) MCG/ACT AEPB Inhale 1-2 puffs into the lungs every 4 (four) hours as needed. Patient taking differently: Inhale 1-2 puffs into the  lungs every 4 (four) hours as needed (for shortness of breath).  06/02/16   Doreatha Lew, MD  Amino Acids-Protein Hydrolys (FEEDING SUPPLEMENT, PRO-STAT SUGAR FREE 64,) LIQD Take 30 mLs by mouth 2 (two) times daily. Patient taking differently: Take 30 mLs by mouth 2 (two) times daily. (0900 & 1700) 11/15/16   Elgergawy, Silver Huguenin, MD  aspirin EC 81 MG tablet Take 81 mg by mouth daily. (0900)    [provider]  atorvastatin (LIPITOR) 20 MG tablet Take 20 mg by mouth every evening. (1700) 05/26/16   [provider]  B Complex-C-Folic Acid (NEPHRO-VITE PO) Take 1 tablet by mouth daily. (1700)    [provider]  donepezil (ARICEPT) 5 MG tablet Take 5 mg by mouth at bedtime. (2100)    [provider]  guaiFENesin (MUCINEX) 600 MG 12 hr tablet Take 1 tablet (600 mg total) by mouth 2 (two) times daily as needed. Patient taking differently: Take 600 mg by mouth 2 (two) times daily.  07/19/16   Bonnielee Haff, MD  midodrine (PROAMATINE) 10 MG tablet Take 1 tablet (10 mg total) by mouth 2 (two) times daily with a meal. Patient taking differently: Take 10 mg by mouth 2 (two) times daily with a meal. (0900 & 1700) 11/15/16   Elgergawy, Silver Huguenin, MD  saccharomyces boulardii (FLORASTOR) 250 MG capsule Take 1 capsule (250 mg total) by mouth 2 (two) times daily. Patient taking differently: Take 250 mg by mouth 2 (two) times daily. (0900 & 1700) 11/15/16   Elgergawy, Silver Huguenin, MD  senna-docusate (SENOKOT-S) 8.6-50 MG tablet Take 2 tablets by mouth 2 (two) times daily. Patient taking differently: Take 2 tablets by mouth 2 (two) times daily. (0900 & 1700) 11/15/16 11/15/17  Elgergawy, Silver Huguenin, MD  sodium chloride (OCEAN) 0.65 % SOLN nasal spray Place 1 spray into both nostrils as needed for congestion.    [provider]    Family History Family History  Problem Relation Age of Onset  . Actinic keratosis Mother   . Cancer Mother        uterine  . Heart disease Father    . Hyperlipidemia Father   . Hypertension Father   . Stroke Father   . Cancer Sister        lung    Social History Social History  Substance Use Topics  . Smoking status: Never Smoker  . Smokeless tobacco: Never Used  . Alcohol use No     Allergies   Codeine   Review of Systems Review of Systems  Unable to perform ROS: Dementia  Allergic/Immunologic: Positive for immunocompromised state.       Hemodialysis patient     Physical Exam Updated Vital Signs BP 95/64   Pulse 63   Temp 98.2 F (36.8 C) (Oral)   Resp 19   SpO2 97%   Physical Exam  Constitutional: She appears well-developed and well-nourished. No distress.  HENT:  Head: Normocephalic and atraumatic.  Eyes: Pupils are equal, round, and reactive to light. Conjunctivae  are normal.  Neck: Neck supple. No tracheal deviation present. No thyromegaly present.  Cardiovascular: Normal rate.   No murmur heard. Irregularly irregular  Pulmonary/Chest: Effort normal and breath sounds normal.  Dialysis catheter present at right subclavian area  Abdominal: Soft. Bowel sounds are normal. She exhibits no distension. There is no tenderness.  Genitourinary:  Genitourinary Comments: Rectal normal tone gross blood,  maroon in color  Musculoskeletal: Normal range of motion. She exhibits no edema or tenderness.  Neurological: She is alert. Coordination normal.  Skin: Skin is warm and dry. No rash noted.  Psychiatric: She has a normal mood and affect.  Nursing note and vitals reviewed.    ED Treatments / Results  Labs (all labs ordered are listed, but only abnormal results are displayed) Labs Reviewed  COMPREHENSIVE METABOLIC PANEL  CBC  PROTIME-INR  APTT  TYPE AND SCREEN    EKG  EKG Interpretation  Date/Time:  Wednesday January 14 2017 19:02:00 EDT Ventricular Rate:  74 PR Interval:    QRS Duration: 97 QT Interval:  401 QTC Calculation: 445 R Axis:   89 Text Interpretation:  Atrial fibrillation Anterior  infarct, old Nonspecific repol abnormality, diffuse leads No significant change since last tracing Confirmed by Orlie Dakin [phone]) on 01/14/2017 7:41:30 PM       Radiology No results found.  Procedures Procedures (including critical care time)  Medications Ordered in ED Medications - No data to display  Results for orders placed or performed during the hospital encounter of 01/14/17  Comprehensive metabolic panel  Result Value Ref Range   Sodium 134 (L) 135 - 145 mmol/L   Potassium 3.9 3.5 - 5.1 mmol/L   Chloride 98 (L) 101 - 111 mmol/L   CO2 26 22 - 32 mmol/L   Glucose, Bld 126 (H) 65 - 99 mg/dL   BUN 10 6 - 20 mg/dL   Creatinine, Ser 1.84 (H) 0.44 - 1.00 mg/dL   Calcium 7.9 (L) 8.9 - 10.3 mg/dL   Total Protein 5.9 (L) 6.5 - 8.1 g/dL   Albumin 2.9 (L) 3.5 - 5.0 g/dL   AST 24 15 - 41 U/L   ALT 11 (L) 14 - 54 U/L   Alkaline Phosphatase 79 38 - 126 U/L   Total Bilirubin 0.6 0.3 - 1.2 mg/dL   GFR calc non Af Amer 24 (L) >60 mL/min   GFR calc Af Amer 28 (L) >60 mL/min   Anion gap 10 5 - 15  CBC  Result Value Ref Range   WBC 8.9 4.0 - 10.5 K/uL   RBC 3.63 (L) 3.87 - 5.11 MIL/uL   Hemoglobin 11.2 (L) 12.0 - 15.0 g/dL   HCT 35.6 (L) 36.0 - 46.0 %   MCV 98.1 78.0 - 100.0 fL   MCH 30.9 26.0 - 34.0 pg   MCHC 31.5 30.0 - 36.0 g/dL   RDW 15.5 11.5 - 15.5 %   Platelets 207 150 - 400 K/uL  Protime-INR - (order if Patient is taking Coumadin / Warfarin)  Result Value Ref Range   Prothrombin Time 13.5 11.4 - 15.2 seconds   INR 1.03   APTT  Result Value Ref Range   aPTT 24 24 - 36 seconds  Type and screen [place]  Result Value Ref Range   ABO/RH(D) O POS    Antibody Screen NEG    Sample Expiration 01/17/2017    No results found. Initial Impression / Assessment and Plan / ED Course  I have reviewed the triage  vital signs and the nursing notes.  Pertinent labs & imaging results that were available during my care of the patient were reviewed by me and  considered in my medical decision making (see chart for details).     Dr. Alcario Drought from hospital service consulted and will arrange for overnight stay Palliative care service consulted Final Clinical Impressions(s) / ED Diagnoses  Diagnosis GI bleeding Final diagnoses:  None    New Prescriptions New Prescriptions   No medications on file     Orlie Dakin, MD 01/14/17 2114

## 2017-01-15 ENCOUNTER — Encounter (HOSPITAL_COMMUNITY): Payer: Self-pay | Admitting: *Deleted

## 2017-01-15 ENCOUNTER — Encounter (HOSPITAL_COMMUNITY)
Admission: EM | Disposition: A | Discharge status: Skilled Nursing Facility | Payer: Self-pay | Source: Home / Self Care | Attending: Internal Medicine

## 2017-01-15 DIAGNOSIS — K56699 Other intestinal obstruction unspecified as to partial versus complete obstruction: Secondary | ICD-10-CM

## 2017-01-15 DIAGNOSIS — K922 Gastrointestinal hemorrhage, unspecified: Secondary | ICD-10-CM

## 2017-01-15 DIAGNOSIS — K921 Melena: Secondary | ICD-10-CM

## 2017-01-15 DIAGNOSIS — K5731 Diverticulosis of large intestine without perforation or abscess with bleeding: Secondary | ICD-10-CM

## 2017-01-15 DIAGNOSIS — K5730 Diverticulosis of large intestine without perforation or abscess without bleeding: Secondary | ICD-10-CM

## 2017-01-15 HISTORY — PX: FLEXIBLE SIGMOIDOSCOPY: SHX5431

## 2017-01-15 LAB — CBC
HEMATOCRIT: 33.5 % — AB (ref 36.0–46.0)
HEMOGLOBIN: 10.7 g/dL — AB (ref 12.0–15.0)
MCH: 31 pg (ref 26.0–34.0)
MCHC: 31.9 g/dL (ref 30.0–36.0)
MCV: 97.1 fL (ref 78.0–100.0)
Platelets: 207 10*3/uL (ref 150–400)
RBC: 3.45 MIL/uL — ABNORMAL LOW (ref 3.87–5.11)
RDW: 15.6 % — AB (ref 11.5–15.5)
WBC: 8.1 10*3/uL (ref 4.0–10.5)

## 2017-01-15 LAB — BASIC METABOLIC PANEL
Anion gap: 7 (ref 5–15)
BUN: 13 mg/dL (ref 6–20)
CALCIUM: 8.5 mg/dL — AB (ref 8.9–10.3)
CHLORIDE: 100 mmol/L — AB (ref 101–111)
CO2: 29 mmol/L (ref 22–32)
CREATININE: 2.55 mg/dL — AB (ref 0.44–1.00)
GFR calc Af Amer: 19 mL/min — ABNORMAL LOW (ref >60)
GFR calc non Af Amer: 16 mL/min — ABNORMAL LOW (ref >60)
GLUCOSE: 90 mg/dL (ref 65–99)
Potassium: 4.2 mmol/L (ref 3.5–5.1)
Sodium: 136 mmol/L (ref 135–145)

## 2017-01-15 LAB — CBG MONITORING, ED
Glucose-Capillary: 88 mg/dL (ref 65–99)
Glucose-Capillary: 96 mg/dL (ref 65–99)

## 2017-01-15 LAB — GLUCOSE, CAPILLARY
GLUCOSE-CAPILLARY: 108 mg/dL — AB (ref 65–99)
Glucose-Capillary: 106 mg/dL — ABNORMAL HIGH (ref 65–99)
Glucose-Capillary: 103 mg/dL — ABNORMAL HIGH (ref 65–99)

## 2017-01-15 LAB — MRSA PCR SCREENING: MRSA by PCR: NEGATIVE

## 2017-01-15 SURGERY — SIGMOIDOSCOPY, FLEXIBLE
Anesthesia: Moderate Sedation

## 2017-01-15 MED ORDER — PENTAFLUOROPROP-TETRAFLUOROETH EX AERO: 1.0000 "application " | INHALATION_SPRAY | CUTANEOUS | Status: DC | PRN

## 2017-01-15 MED ORDER — MIDAZOLAM HCL 5 MG/ML IJ SOLN
INTRAMUSCULAR | Status: AC
  Filled 2017-01-15: qty 2

## 2017-01-15 MED ORDER — SODIUM CHLORIDE 0.9 % IV SOLN: 100.0000 mL | INTRAVENOUS | Status: DC | PRN

## 2017-01-15 MED ORDER — LIDOCAINE HCL (PF) 1 % IJ SOLN: 5.0000 mL | INTRAMUSCULAR | Status: DC | PRN

## 2017-01-15 MED ORDER — FENTANYL CITRATE (PF) 100 MCG/2ML IJ SOLN
INTRAMUSCULAR | Status: DC | PRN
  Administered 2017-01-15: 25 ug via INTRAVENOUS

## 2017-01-15 MED ORDER — SODIUM CHLORIDE 0.9 % IV SOLN
INTRAVENOUS | Status: AC | PRN
  Administered 2017-01-15: 500 mL via INTRAVENOUS

## 2017-01-15 MED ORDER — MIDAZOLAM HCL 10 MG/2ML IJ SOLN
INTRAMUSCULAR | Status: DC | PRN
  Administered 2017-01-15 (×2): 1 mg via INTRAVENOUS

## 2017-01-15 MED ORDER — LIDOCAINE-PRILOCAINE 2.5-2.5 % EX CREA: 1.0000 "application " | TOPICAL_CREAM | CUTANEOUS | Status: DC | PRN

## 2017-01-15 MED ORDER — FENTANYL CITRATE (PF) 100 MCG/2ML IJ SOLN
INTRAMUSCULAR | Status: AC
  Filled 2017-01-15: qty 2

## 2017-01-15 NOTE — Consult Note (Signed)
[facility] Renal Consultation Note    Indication for Consultation:  Management of ESRD/hemodialysis, anemia, hypertension/volume, and secondary hyperparathyroidism. PCP:  HPI: Debbie [name] is a 82 y.o. female with ESRD, hypotension (on midodrine), Hx endometrial cancer, COPD, Type 2 DM (diet controlled), and dementia who was admitted with hematochezia.  S/p recent admit 11/2016 with lower GI bleed (hemorrhoidal), transudative R pleural effusion (s/p thoracentesis, Cx negative), and R heel osteomyelitis for which she was treated with doxycycline and IV Cefazolin with HD x 6 weeks. Has been feeling well since that time until developed hematochezia yesterday evening. SNF sent her to ED for evaluation, there noted to have maroon-colored stool on exam, Hgb 11.2 ( down to 10.7 today), and hypotension. Today, she is asymptomatic. Denies CP, dyspnea, fever, chills, abdominal pain, N/V, diarrhea, edema.  Usually dialyzes MWF at [facility], last HD was 01/14/17 which she completed in entirety. Currently using TDC as her access, Has RUE AVF, but has been unable to cannulate. She underwent fistulogram on 8/7, and is scheduled for surgical superficialization with branch ligation of the AVF on 8/23.      Past Medical History:  Diagnosis Date  . Allergy   . Anxiety   . Arthritis   . Cancer ([place]) dx'd 12/2007   endometroid adenocarcinoma  . Cataract    both  . Diabetes mellitus    fasting [id number]  . Dizziness   . History of radiation therapy 9/21,9/28,10/05,05/20/2008   4 txs 2400 cGy endometrial adenocarcinoma  . Hypertension   . Peripheral vascular disease ([place])   . Seasonal allergies   . Secondary malignant neoplasm of vagina ([place])   . UTI (urinary tract infection) 05/2016   Past Surgical History:  Procedure Laterality Date  . A/V FISTULAGRAM Right 01/13/2017   Procedure: A/V Fistulagram - Right Arm;  Surgeon: Serafina Mitchell, MD;  Location: [facility];  Service: Cardiovascular;  Laterality: Right;  . ABDOMINAL HYSTERECTOMY    . APPENDECTOMY    . AV FISTULA PLACEMENT Left 07/17/2016   Procedure: LEFT ARM ARTERIOVENOUS (AV) FISTULA CREATION;  Surgeon: Angelia Mould, MD;  Location: [place];  Service: Vascular;  Laterality: Left;  . ENDARTERECTOMY Left 01/12/2014   Procedure: LEFT CAROTID ARTERY ENDARTERECTOMY WITH HEMASHIELD PATCH ANGIOPLASTY;  Surgeon: Mal Misty, MD;  Location: [place];  Service: Vascular;  Laterality: Left;  . ENDARTERECTOMY Right 03/17/2014   Procedure: RIGHT CAROTD ARTERY ENDARTERECTOMY WITH DACRON PATCH ANGIOPLASTY;  Surgeon: Mal Misty, MD;  Location: [place];  Service: Vascular;  Laterality: Right;  . EYE SURGERY Bilateral    cataracts  . INSERTION OF DIALYSIS CATHETER Right 07/17/2016   Procedure: INSERTION OF DIALYSIS CATHETER;  Surgeon: Angelia Mould, MD;  Location: [place];  Service: Vascular;  Laterality: Right;  . NASAL SEPTUM SURGERY    . NECK SURGERY Bilateral    arterial  . TONSILLECTOMY     Family History  Problem Relation Age of Onset  . Actinic keratosis Mother   . Cancer Mother        uterine  . Heart disease Father   . Hyperlipidemia Father   . Hypertension Father   . Stroke Father   . Cancer Sister        lung   Social History:  reports that she has never smoked. She has never used smokeless tobacco. She reports that she does not drink alcohol or use drugs.  ROS: As per HPI otherwise negative.  Physical Exam:  Vitals:   01/15/17 0700 01/15/17 0715 01/15/17 0730 01/15/17 0926  BP: (!) 126/52 (!) 111/41 127/73 (!) 95/58  Pulse: 71 (!) 57 (!) 56 92  Resp: 16 15 18 16   Temp:      TempSrc:      SpO2: 95% 94% 94% 91%     General: Well developed elderly female, in no acute distress. Head: Normocephalic, atraumatic, sclera non-icteric, mucus membranes are moist. Neck: Supple without lymphadenopathy/masses. JVD not elevated. Lungs: Clear bilaterally to auscultation without  wheezes, rales, or rhonchi. Breathing is unlabored. Heart: RRR; 2/6 systolic murmur. Abdomen: Soft, non-tender, non-distended with normoactive bowel sounds.  Musculoskeletal:  Strength and tone appear normal for age. Lower extremities: No LE edema or ischemic changes. Neuro: Alert and oriented X 3. Moves all extremities spontaneously. Psych:  Responds to questions appropriately with a normal affect. Dialysis Access: TDC in R chest, small RUE AVF + thrill (see HPI).  Allergies  Allergen Reactions  . Codeine Other (See Comments)    Unusual mouth sensation   Prior to Admission medications   Medication Sig Start Date End Date Taking? Authorizing Provider  Albuterol Sulfate (PROAIR RESPICLICK) 979 (90 Base) MCG/ACT AEPB Inhale 1-2 puffs into the lungs every 4 (four) hours as needed. Patient taking differently: Inhale 1-2 puffs into the lungs every 4 (four) hours as needed (for shortness of breath).  06/02/16  Yes Patrecia Pour, Christean Grief, MD  Amino Acids-Protein Hydrolys (FEEDING SUPPLEMENT, PRO-STAT SUGAR FREE 64,) LIQD Take 30 mLs by mouth 2 (two) times daily. Patient taking differently: Take 30 mLs by mouth 2 (two) times daily. (0900 & 1700) 11/15/16  Yes Elgergawy, Silver Huguenin, MD  aspirin EC 81 MG tablet Take 81 mg by mouth daily. (0900)   Yes [provider]  atorvastatin (LIPITOR) 20 MG tablet Take 20 mg by mouth every evening. (1700) 05/26/16  Yes [provider]  B Complex-C-Folic Acid (NEPHRO-VITE PO) Take 1 tablet by mouth daily. (1700)   Yes [provider]  donepezil (ARICEPT) 5 MG tablet Take 5 mg by mouth at bedtime. (2100)   Yes [provider]  guaiFENesin (MUCINEX) 600 MG 12 hr tablet Take 1 tablet (600 mg total) by mouth 2 (two) times daily as needed. Patient taking differently: Take 600 mg by mouth 2 (two) times daily.  07/19/16  Yes Bonnielee Haff, MD  midodrine (PROAMATINE) 10 MG tablet Take 1 tablet (10 mg total) by mouth 2 (two) times daily with  a meal. Patient taking differently: Take 10 mg by mouth 2 (two) times daily with a meal. (0900 & 1700) 11/15/16  Yes Elgergawy, Silver Huguenin, MD  saccharomyces boulardii (FLORASTOR) 250 MG capsule Take 1 capsule (250 mg total) by mouth 2 (two) times daily. Patient taking differently: Take 250 mg by mouth 2 (two) times daily. (0900 & 1700) 11/15/16  Yes Elgergawy, Silver Huguenin, MD  senna-docusate (SENOKOT-S) 8.6-50 MG tablet Take 2 tablets by mouth 2 (two) times daily. Patient taking differently: Take 2 tablets by mouth 2 (two) times daily. (0900 & 1700) 11/15/16 11/15/17 Yes Elgergawy, Silver Huguenin, MD   Current Facility-Administered Medications  Medication Dose Route Frequency Provider Last Rate Last Dose  . acetaminophen (TYLENOL) tablet 650 mg  650 mg Oral Q6H PRN Etta Quill, DO       Or  . acetaminophen (TYLENOL) suppository 650 mg  650 mg Rectal Q6H PRN Etta Quill, DO      . albuterol (PROVENTIL) (2.5 MG/3ML) 0.083% nebulizer solution 3 mL  3 mL Inhalation Q4H PRN Etta Quill, DO      . atorvastatin (LIPITOR) tablet 20 mg  20 mg Oral QPM Jennette Kettle M, DO   20 mg at 01/14/17 2248  . donepezil (ARICEPT) tablet 5 mg  5 mg Oral QHS Jennette Kettle M, DO   5 mg at 01/14/17 2248  . feeding supplement (PRO-STAT SUGAR FREE 64) liquid 30 mL  30 mL Oral BID Etta Quill, DO      . guaiFENesin [facility]) 12 hr tablet 600 mg  600 mg Oral BID Jennette Kettle M, DO   600 mg at 01/14/17 2248  . midodrine (PROAMATINE) tablet 10 mg  10 mg Oral BID WC Etta Quill, DO      . ondansetron [facility]) tablet 4 mg  4 mg Oral Q6H PRN Etta Quill, DO       Or  . ondansetron [facility]) injection 4 mg  4 mg Intravenous Q6H PRN Etta Quill, DO      . saccharomyces boulardii (FLORASTOR) capsule 250 mg  250 mg Oral BID Alcario Drought, Jared M, DO      . sodium chloride (OCEAN) 0.65 % nasal spray 1 spray  1 spray Each Nare PRN Etta Quill, DO       Labs: Basic Metabolic Panel:  Recent Labs Lab  01/13/17 0742 01/14/17 1823 01/15/17 0302  NA 137 134* 136  K 4.1 3.9 4.2  CL 100* 98* 100*  CO2  --  26 29  GLUCOSE 99 126* 90  BUN 26* 10 13  CREATININE 3.20* 1.84* 2.55*  CALCIUM  --  7.9* 8.5*   Liver Function Tests:  Recent Labs Lab 01/14/17 1823  AST 24  ALT 11*  ALKPHOS 79  BILITOT 0.6  PROT 5.9*  ALBUMIN 2.9*   CBC:  Recent Labs Lab 01/13/17 0742 01/14/17 1823 01/15/17 0302  WBC  --  8.9 8.1  HGB 13.3 11.2* 10.7*  HCT 39.0 35.6* 33.5*  MCV  --  98.1 97.1  PLT  --  207 207   CBG:  Recent Labs Lab 01/14/17 2022 01/15/17 0056 01/15/17 0347  GLUCAP 106* 96 88   Dialysis Orders:  MWF at [facility], BFR 400, DFR 800, EDW 72kg, 4K/2.25Ca bath, TDC, Heparin 2200 bolus - Venofer 50mg  IV weekly - No ESA, VDRA currently. Not on binders.  Assessment/Plan: 1.  Hematochezia/LGIB: GI following, for flex sigmoidoscopy today. 2.  ESRD: Continue HD per MWF schedule, no heparin. 3.  BP/volume: Typically hypotensive, on midodrine BID. No edema. 4.  Anemia: Hgb down slightly in setting of GIB. If drops further, will start ESA (has not needed in some time). 5.  Metabolic bone disease: Corr Ca ok, phos pending. Not on binders or VDRA. 6.  Nutrition: Alb low, will need high protein diet and supps once eating.   Veneta Penton, PA-C 01/15/2017, 10:01 AM  [facility] Pager: [phone]  Pt seen, examined and agree w A/P as above. ESRD pt w/ rectal bleeding sent from SNF, had flex sig today. Hb levels are not significantly low. Plan HD for tomorrow, no heparin.  Will follow.  Kelly Splinter MD [employer] pager [phone]   01/15/2017, 4:54 PM

## 2017-01-15 NOTE — Consult Note (Signed)
[facility] Consult: 8:13 AM 01/15/2017  LOS: 1 day    Referring Provider: Dr Wynelle Cleveland  Primary Care Physician:  [name], MD Primary Gastroenterologist:  unassigned     Reason for Consultation:  hematochezia   HPI: Debbie [name] is a 83 y.o. female.  PMH ESRD, HD on MWF since 07/2016.  COPD.  CHFd.   Endometrial cancer, 2009 hysterectomy. Right pleural effusion and thoracentesis 11/2016.  Osteomyelitis of foot 11/2016.  ASPVD, s/p bil CEA.  DM 2, diet controlled.  Dementia, not severe.  Lives at SNF.   No records of previous colonoscopy, though pt recalls she has had this.    During 6/7 - 11/15/16 admission had rectal bleeding and abd cramping.  This attributed to her visible hemorrhoids.  On CT 6/7 had large ventral abdominal hernia, bil pleural effusions, rectosigmoid diverticulosis, aorto and branch vessel atherosclerosis.  Hgb 9.7 at discharge.     Single episode of passing dark blood PR at SNF yesterday.  Volume not quantified.  Pt denies abd or other pain, nausea, constipation.  No anorexia.  Vague on details as to past GI bleeding.   Some dizziness, has passed.  No previous transfusions or bleeding issues.  No NSAIDs, just 81 mg ASA.  No hx liver disease.  No recall of iron infusions or epo type meds given to her.  No repeat episodes of hematochezia/tarry stool.  Hgb 11.2 >> 10.7 overnight.  BUN not elevated.  Coags, platelets OK      Past Medical History:  Diagnosis Date  . Allergy   . Anxiety   . Arthritis   . Cancer ([place]) dx'd 12/2007   endometroid adenocarcinoma  . Cataract    both  . Diabetes mellitus    fasting cbgs100-120  . Dizziness   . History of radiation therapy 9/21,9/28,10/05,05/20/2008   4 txs 2400 cGy endometrial adenocarcinoma  . Hypertension   . Peripheral vascular  disease ([place])   . Seasonal allergies   . 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 UTI (urinary tract infection) 05/2016    Past Surgical History:  Procedure Laterality Date  . A/V FISTULAGRAM Right 01/13/2017   Procedure: A/V Fistulagram - Right Arm;  Surgeon: Serafina Mitchell, MD;  Location: [facility];  Service: Cardiovascular;  Laterality: Right;  . ABDOMINAL HYSTERECTOMY    . APPENDECTOMY    . AV FISTULA PLACEMENT Left 07/17/2016   Procedure: LEFT ARM ARTERIOVENOUS (AV) FISTULA CREATION;  Surgeon: Angelia Mould, MD;  Location: [place];  Service: Vascular;  Laterality: Left;  . ENDARTERECTOMY Left 01/12/2014   Procedure: LEFT CAROTID ARTERY ENDARTERECTOMY WITH HEMASHIELD PATCH ANGIOPLASTY;  Surgeon: Mal Misty, MD;  Location: [facility];  Service: Vascular;  Laterality: Left;  . ENDARTERECTOMY Right 03/17/2014   Procedure: RIGHT CAROTD ARTERY ENDARTERECTOMY WITH DACRON PATCH ANGIOPLASTY;  Surgeon: Mal Misty, MD;  Location: [place];  Service: Vascular;  Laterality: Right;  . EYE SURGERY Bilateral    cataracts  . INSERTION OF DIALYSIS CATHETER Right 07/17/2016   Procedure: INSERTION OF  DIALYSIS CATHETER;  Surgeon: Angelia Mould, MD;  Location: [facility];  Service: Vascular;  Laterality: Right;  . NASAL SEPTUM SURGERY    . NECK SURGERY Bilateral    arterial  . TONSILLECTOMY      Prior to Admission medications   Medication Sig Start Date End Date Taking? Authorizing Provider  Albuterol Sulfate (PROAIR RESPICLICK) 570 (90 Base) MCG/ACT AEPB Inhale 1-2 puffs into the lungs every 4 (four) hours as needed. Patient taking differently: Inhale 1-2 puffs into the lungs every 4 (four) hours as needed (for shortness of breath).  06/02/16  Yes Patrecia Pour, Christean Grief, MD  Amino Acids-Protein Hydrolys (FEEDING SUPPLEMENT, PRO-STAT SUGAR FREE 64,) LIQD Take 30 mLs by mouth 2 (two) times daily. Patient taking differently: Take 30 mLs by mouth 2 (two) times daily. (0900 & 1700) 11/15/16   Yes Elgergawy, Silver Huguenin, MD  aspirin EC 81 MG tablet Take 81 mg by mouth daily. (0900)   Yes [provider]  atorvastatin (LIPITOR) 20 MG tablet Take 20 mg by mouth every evening. (1700) 05/26/16  Yes [provider]  B Complex-C-Folic Acid (NEPHRO-VITE PO) Take 1 tablet by mouth daily. (1700)   Yes [provider]  donepezil (ARICEPT) 5 MG tablet Take 5 mg by mouth at bedtime. (2100)   Yes [provider]  guaiFENesin (MUCINEX) 600 MG 12 hr tablet Take 1 tablet (600 mg total) by mouth 2 (two) times daily as needed. Patient taking differently: Take 600 mg by mouth 2 (two) times daily.  07/19/16  Yes Bonnielee Haff, MD  midodrine (PROAMATINE) 10 MG tablet Take 1 tablet (10 mg total) by mouth 2 (two) times daily with a meal. Patient taking differently: Take 10 mg by mouth 2 (two) times daily with a meal. (0900 & 1700) 11/15/16  Yes Elgergawy, Silver Huguenin, MD  saccharomyces boulardii (FLORASTOR) 250 MG capsule Take 1 capsule (250 mg total) by mouth 2 (two) times daily. Patient taking differently: Take 250 mg by mouth 2 (two) times daily. (0900 & 1700) 11/15/16  Yes Elgergawy, Silver Huguenin, MD  senna-docusate (SENOKOT-S) 8.6-50 MG tablet Take 2 tablets by mouth 2 (two) times daily. Patient taking differently: Take 2 tablets by mouth 2 (two) times daily. (0900 & 1700) 11/15/16 11/15/17 Yes Elgergawy, Silver Huguenin, MD    Scheduled Meds: . atorvastatin  20 mg Oral QPM  . donepezil  5 mg Oral QHS  . feeding supplement (PRO-STAT SUGAR FREE 64)  30 mL Oral BID  . guaiFENesin  600 mg Oral BID  . midodrine  10 mg Oral BID WC  . saccharomyces boulardii  250 mg Oral BID   Infusions:  PRN Meds: acetaminophen **OR** acetaminophen, albuterol, ondansetron **OR** ondansetron (ZOFRAN) IV, sodium chloride   Allergies as of 01/14/2017 - Review Complete 01/14/2017  Allergen Reaction Noted  . Codeine Other (See Comments) 07/07/2011    Family History  Problem Relation Age of Onset  .  Actinic keratosis Mother   . Cancer Mother        uterine  . Heart disease Father   . Hyperlipidemia Father   . Hypertension Father   . Stroke Father   . Cancer Sister        lung    Social History   Social History  . Marital status: Married    Spouse name: N/A  . Number of children: N/A  . Years of education: N/A   Occupational History  . retired     Social History Main Topics  .  Smoking status: Never Smoker  . Smokeless tobacco: Never Used  . Alcohol use No  . Drug use: No  . Sexual activity: No   Other Topics Concern  . Not on file   Social History Narrative   Lives alone, son main caretaker.     REVIEW OF SYSTEMS: Constitutional:  Feels well, no weakness ENT:  No nose bleeds Pulm:  No SOB or cough CV:  No palpitations, no LE edema. No chest pain GU:  No hematuria, no frequency.  Still makes urine.   GI:  Per HPI.  No heartburn or dyspagia Heme:  Per HPI   Transfusions:  Per HPI Neuro:  No headaches, no peripheral tingling or numbness Derm:  No itching, no rash or sores.  Endocrine:  No sweats or chills.  No polyuria or dysuria Immunization:  Not queried Travel:  None beyond local counties in last few months.    PHYSICAL EXAM: Vital signs in last 24 hours: Vitals:   01/15/17 0715 01/15/17 0730  BP: (!) 111/41 127/73  Pulse: (!) 57 (!) 56  Resp: 15 18  Temp:     Wt Readings from Last 3 Encounters:  01/13/17 68 kg (150 lb)  01/06/17 68.9 kg (152 lb)  11/14/16 65.7 kg (144 lb 13.5 oz)    General: pleasant, alert, looks well Head:  No signs trauma, no assymetry  Eyes:  No icterus or conj pallor Ears:  Not HOH  Nose:  No congestion or discharge Mouth:  Moist oral mucosa Neck:  No mass or JVD Lungs:  Clear bil.  No cough or labored breathing Heart: irregular, iregular.  Not tachy.  No mrg Abdomen:  Soft, NT, ND.  No mass or HSM.  Active BS.   Rectal: dark burgundy blood stool.  External hemorrhoids without stigmata of bleeding   Musc/Skeltl:  no joint swelling or deformity Extremities:  No CCE  Neurologic:  Oriented to self and place, not to year.  Moves all 4 limbs, strength not tested.  No tremor Skin:  No telangectasia, sores, rash Tattoos:  none Nodes:  No cervical adenopathy   Psych:  Pleasant, calm, cooperative.    LAB RESULTS:  Recent Labs  01/13/17 0742 01/14/17 1823 01/15/17 0302  WBC  --  8.9 8.1  HGB 13.3 11.2* 10.7*  HCT 39.0 35.6* 33.5*  PLT  --  207 207   BMET Lab Results  Component Value Date   NA 136 01/15/2017   NA 134 (L) 01/14/2017   NA 137 01/13/2017   K 4.2 01/15/2017   K 3.9 01/14/2017   K 4.1 01/13/2017   CL 100 (L) 01/15/2017   CL 98 (L) 01/14/2017   CL 100 (L) 01/13/2017   CO2 29 01/15/2017   CO2 26 01/14/2017   CO2 27 11/15/2016   GLUCOSE 90 01/15/2017   GLUCOSE 126 (H) 01/14/2017   GLUCOSE 99 01/13/2017   BUN 13 01/15/2017   BUN 10 01/14/2017   BUN 26 (H) 01/13/2017   CREATININE 2.55 (H) 01/15/2017   CREATININE 1.84 (H) 01/14/2017   CREATININE 3.20 (H) 01/13/2017   CALCIUM 8.5 (L) 01/15/2017   CALCIUM 7.9 (L) 01/14/2017   CALCIUM 8.6 (L) 11/15/2016   LFT  Recent Labs  01/14/17 1823  PROT 5.9*  ALBUMIN 2.9*  AST 24  ALT 11*  ALKPHOS 79  BILITOT 0.6   PT/INR Lab Results  Component Value Date   INR 1.03 01/14/2017   INR 1.03 07/17/2016   INR 0.96 06/08/2016  IMPRESSION:   *  GIB.  ? Upper vs lower.      PLAN:     *  Will d/w Dr Carlean Purl.  Flex sig without prep today.     Azucena Freed  01/15/2017, 8:13 AM Pager: [phone]    [facility] Attending   I have taken an interval history, reviewed the chart and examined the patient. I agree with the Advanced Practitioner's note, impression and recommendations.   Seems like lower GI bleeding  Will evaluate with flex sig  The risks and benefits as well as alternatives of endoscopic procedure(s) have been discussed and reviewed. All questions answered. The patient agrees to proceed.  Gatha Mayer, MD, [facility] [phone] (pager) 01/15/2017 2:42 PM

## 2017-01-15 NOTE — ED Notes (Signed)
Pt very hypotensive, checked for rectal bleeding only small smear on pad. Pt is easy to arouse and denies any lightheadedness. BP moved to L leg. Family members earlier said BP is low always on arm and is more accurate on leg. Will continue to monitor

## 2017-01-15 NOTE — Clinical Social Work Note (Signed)
Clinical Social Work Assessment  Patient Details  Name: Debbie [name] MRN: [id number] Date of Birth: 28-Nov-1930  Date of referral:  01/15/17               Reason for consult:  Discharge Planning, Facility Placement                Permission sought to share information with:    Permission granted to share information::  Yes, Verbal Permission Granted  Name::     Estie Sproule  Agency::  [facility]  Relationship::  son  Contact Information:  [phone]  Housing/Transportation Living arrangements for the past 2 months:  [place] of Information:  Patient Patient Interpreter Needed:  None Criminal Activity/Legal Involvement Pertinent to Current Situation/Hospitalization:  No - Comment as needed Significant Relationships:  Adult Children Lives with:  Facility Resident Do you feel safe going back to the place where you live?  Yes Need for family participation in patient care:  No (Coment)  Care giving concerns:  Patient is from [facility] under their long term side with palliative following. Patient son stated he would like patient to return back to [facility] / plan:  Clinical Social Worker spoke with patient son via phone. Patient is down for a procedure so CSW was unable to assess in person. CSW spoke with son and he stated that he would like patient to return back to [place]. Patient has support from family. CSW to arrange transport back to facility once medically stable   Employment status:  Retired Nurse, adult PT Recommendations:  Not assessed at this time Information / Referral to community resources:  [place]  Patient/Family's Response to care: Family is very supportive of patient. Family is appreciative of CSW role in patients care  Patient/Family's Understanding of and Emotional Response to Diagnosis, Current Treatment, and Prognosis:  Family with good understanding  of current medical state and limitations during patients recent hospitalization  Emotional Assessment Appearance:  Other (Comment Required (did not lay eyes on patient ) Attitude/Demeanor/Rapport:  Other (was unable to assess pt) Affect (typically observed):  Unable to Assess Orientation:  Oriented to Self, Oriented to Place, Oriented to Situation Alcohol / Substance use:  Not Applicable Psych involvement (Current and /or in the community):  No (Comment)  Discharge Needs  Concerns to be addressed:  No discharge needs identified Readmission within the last 30 days:  No Current discharge risk:  None Barriers to Discharge:  No Barriers Identified   Wende Neighbors, LCSW 01/15/2017, 2:27 PM

## 2017-01-15 NOTE — ED Notes (Signed)
Attempted to call report

## 2017-01-15 NOTE — Op Note (Signed)
[facility] Patient Name: Debbie [name] Procedure Date : 01/15/2017 MRN: [id number] Attending MD: Gatha Mayer , MD Date of Birth: 24-Feb-1931 CSN: [id number] Age: 83 Admit Type: Inpatient Procedure:                Flexible Sigmoidoscopy Indications:              Hematochezia Providers:                Gatha Mayer, MD, Cleda Daub, RN, Corliss Parish, Technician Referring MD:              Medicines:                Midazolam 2 mg IV, Fentanyl 25 micrograms IV Complications:            No immediate complications. Estimated Blood Loss:     Estimated blood loss: none. Procedure:                Pre-Anesthesia Assessment:                           - Prior to the procedure, a History and Physical                            was performed, and patient medications and                            allergies were reviewed. The patient's tolerance of                            previous anesthesia was also reviewed. The risks                            and benefits of the procedure and the sedation                            options and risks were discussed with the patient.                            All questions were answered, and informed consent                            was obtained. Prior Anticoagulants: The patient has                            taken no previous anticoagulant or antiplatelet                            agents. ASA Grade Assessment: III - A patient with                            severe systemic disease. After reviewing the risks  and benefits, the patient was deemed in                            satisfactory condition to undergo the procedure.                           After obtaining informed consent, the scope was                            passed under direct vision. The [id number] [phone])                            scope was introduced through the anus and advanced                            to  the the sigmoid colon. The patient tolerated the                            procedure well. The flexible sigmoidoscopy was                            technically difficult and complex due to restricted                            mobility of the colon. The quality of the bowel                            preparation was none given. Scope In: Scope Out: Findings:      The perianal examination was normal.      The digital rectal exam findings include blood.      Red blood was found in the rectum.      Multiple small and large-mouthed diverticula were found in the sigmoid       colon.      A benign-appearing, intrinsic severe stenosis was found in the distal       sigmoid colon and was non-traversed. Impression:               - Blood found on digital rectal exam.                           - Blood in the rectum.                           - Diverticulosis in the sigmoid colon. Bloody                            mucous then clears - so think distal sigmoid                            diverticular bleed - though I could not go as far                            proximal as desired due to fixed sigmoid colon and  stenosis. Would use a smaller more flexible scope                            if need to look again (ultraslim colonoscope vs                            gastroscope)                           - Stricture in the distal sigmoid colon.                           - No specimens collected. Moderate Sedation:      Moderate (conscious) sedation was administered by the endoscopy nurse       and supervised by the endoscopist. The following parameters were       monitored: oxygen saturation, heart rate, blood pressure, respiratory       rate, EKG, adequacy of pulmonary ventilation, and response to care.       Total physician intraservice time was 6 minutes. Recommendation:           - Return patient to hospital ward for ongoing care. Procedure Code(s):        ---  Professional ---                           [phone], Sigmoidoscopy, flexible; diagnostic,                            including collection of specimen(s) by brushing or                            washing, when performed (separate procedure) Diagnosis Code(s):        --- Professional ---                           K62.5, Hemorrhage of anus and rectum                           K56.69, Other intestinal obstruction                           K92.1, Melena (includes Hematochezia)                           K57.30, Diverticulosis of large intestine without                            perforation or abscess without bleeding CPT copyright 2016 American Medical Association. All rights reserved. The codes documented in this report are preliminary and upon coder review may  be revised to meet current compliance requirements. Gatha Mayer, MD 01/15/2017 3:31:26 PM This report has been signed electronically. Number of Addenda: 0

## 2017-01-15 NOTE — Progress Notes (Signed)
Bedside reporting done with Albert,RN and will initiate nursing care and assessment.

## 2017-01-15 NOTE — ED Notes (Signed)
Check CBG 96, RN Nicole informed

## 2017-01-15 NOTE — Progress Notes (Signed)
PROGRESS NOTE    [name]   WPY:[id number]  DOB: Nov 03, 1930  DOA: 01/14/2017 PCP: [name], MD   Brief Narrative:  Debbie [name] is a 83 y.o. female with medical history significant of ESRD dialysis MWF, last dialysis was earlier today.  Patient sent in from SNF after 1 episode of bloody BM, maroon colored stool.  This occurred at about 5pm. No abdominal pain. No dizziness.    Subjective: States she was told a few yrs ago that she had internal hemorrhoids. She has had a small amount of blood mixed with stool today. No abdominal pain.  ROS: no complaints of nausea, vomiting, constipation diarrhea, cough, dyspnea or dysuria. No other complaints.   Assessment & Plan:   Principal Problem:   Hematochezia/    Diverticulosis of colon with hemorrhage - sigmoidoscopy suggestive of sigmoid diverticular bleed- keep in hospital and monitor until bleeding resolves - Hb is stable  Active Problems:     COPD with hypoxia (HCC) - cont O2- no wheezing    Hypotension - Midodrine  ESRD on HD - MWF  Dementia - Aricept  DVT prophylaxis: SCDs Code Status: DNR Family Communication:  Disposition Plan: return to SNF once bleeding stops Consultants:   GI Procedures:   Flex sig Antimicrobials:  Anti-infectives    None       Objective: Vitals:   01/15/17 1510 01/15/17 1520 01/15/17 1525 01/15/17 1540  BP: (!) 105/59 (!) 62/25 (!) 63/27 (!) 100/30  Pulse: 66 76 (!) 44 72  Resp: 18 18 19 16   Temp: 97.7 F (36.5 C)     TempSrc: Oral     SpO2: 100% 95% 95% 92%    Intake/Output Summary (Last 24 hours) at 01/15/17 1615 Last data filed at 01/15/17 1100  Gross per 24 hour  Intake                0 ml  Output                0 ml  Net                0 ml   There were no vitals filed for this visit.  Examination: General exam: Appears comfortable  HEENT: PERRLA, oral mucosa moist, no sclera icterus or thrush Respiratory system: Clear to auscultation.  Respiratory effort normal. Cardiovascular system: S1 & S2 heard, RRR.  No murmurs  Gastrointestinal system: Abdomen soft, non-tender, nondistended. Normal bowel sound. No organomegaly Central nervous system: Alert and oriented. No focal neurological deficits. Extremities: No cyanosis, clubbing or edema Skin: No rashes or ulcers Psychiatry:  Mood & affect appropriate.     Data Reviewed: I have personally reviewed following labs and imaging studies  CBC:  Recent Labs Lab 01/13/17 0742 01/14/17 1823 01/15/17 0302  WBC  --  8.9 8.1  HGB 13.3 11.2* 10.7*  HCT 39.0 35.6* 33.5*  MCV  --  98.1 97.1  PLT  --  207 053   Basic Metabolic Panel:  Recent Labs Lab 01/13/17 0742 01/14/17 1823 01/15/17 0302  NA 137 134* 136  K 4.1 3.9 4.2  CL 100* 98* 100*  CO2  --  26 29  GLUCOSE 99 126* 90  BUN 26* 10 13  CREATININE 3.20* 1.84* 2.55*  CALCIUM  --  7.9* 8.5*   GFR: Estimated Creatinine Clearance: 14.8 mL/min (A) (by C-G formula based on SCr of 2.55 mg/dL (H)). Liver Function Tests:  Recent Labs Lab 01/14/17 1823  AST 24  ALT 11*  ALKPHOS 79  BILITOT 0.6  PROT 5.9*  ALBUMIN 2.9*   No results for input(s): LIPASE, AMYLASE in the last 168 hours. No results for input(s): AMMONIA in the last 168 hours. Coagulation Profile:  Recent Labs Lab 01/14/17 1823  INR 1.03   Cardiac Enzymes: No results for input(s): CKTOTAL, CKMB, CKMBINDEX, TROPONINI in the last 168 hours. BNP (last 3 results) No results for input(s): PROBNP in the last 8760 hours. HbA1C: No results for input(s): HGBA1C in the last 72 hours. CBG:  Recent Labs Lab 01/14/17 2022 01/15/17 0056 01/15/17 0347 01/15/17 0821 01/15/17 1219  GLUCAP 106* 96 88 106* 108*   Lipid Profile: No results for input(s): CHOL, HDL, LDLCALC, TRIG, CHOLHDL, LDLDIRECT in the last 72 hours. Thyroid Function Tests: No results for input(s): TSH, T4TOTAL, FREET4, T3FREE, THYROIDAB in the last 72 hours. Anemia Panel: No  results for input(s): VITAMINB12, FOLATE, FERRITIN, TIBC, IRON, RETICCTPCT in the last 72 hours. Urine analysis:    Component Value Date/Time   COLORURINE YELLOW 06/26/2016 0028   APPEARANCEUR CLOUDY (A) 06/26/2016 0028   LABSPEC 1.013 06/26/2016 0028   PHURINE 5.0 06/26/2016 0028   GLUCOSEU 50 (A) 06/26/2016 0028   HGBUR NEGATIVE 06/26/2016 0028   BILIRUBINUR NEGATIVE 06/26/2016 0028   KETONESUR NEGATIVE 06/26/2016 0028   PROTEINUR 100 (A) 06/26/2016 0028   UROBILINOGEN 1.0 03/07/2014 1510   NITRITE NEGATIVE 06/26/2016 0028   LEUKOCYTESUR TRACE (A) 06/26/2016 0028   Sepsis Labs: @LABRCNTIP (procalcitonin:4,lacticidven:4) ) Recent Results (from the past 240 hour(s))  MRSA PCR Screening     Status: None   Collection Time: 01/15/17 10:38 AM  Result Value Ref Range Status   MRSA by PCR NEGATIVE NEGATIVE Final    Comment:        The GeneXpert MRSA Assay (FDA approved for NASAL specimens only), is one component of a comprehensive MRSA colonization surveillance program. It is not intended to diagnose MRSA infection nor to guide or monitor treatment for MRSA infections.          Radiology Studies: No results found.    Scheduled Meds: . atorvastatin  20 mg Oral QPM  . donepezil  5 mg Oral QHS  . feeding supplement (PRO-STAT SUGAR FREE 64)  30 mL Oral BID  . guaiFENesin  600 mg Oral BID  . midodrine  10 mg Oral BID WC  . saccharomyces boulardii  250 mg Oral BID   Continuous Infusions: . sodium chloride    . sodium chloride       LOS: 1 day    Time spent in minutes: 35    Debbe Odea, MD [facility] Pager: [email or web address] Password [email or web address] 01/15/2017, 4:15 PM

## 2017-01-16 DIAGNOSIS — I959 Hypotension, unspecified: Secondary | ICD-10-CM

## 2017-01-16 DIAGNOSIS — J449 Chronic obstructive pulmonary disease, unspecified: Secondary | ICD-10-CM

## 2017-01-16 DIAGNOSIS — E118 Type 2 diabetes mellitus with unspecified complications: Secondary | ICD-10-CM

## 2017-01-16 DIAGNOSIS — K5731 Diverticulosis of large intestine without perforation or abscess with bleeding: Principal | ICD-10-CM

## 2017-01-16 DIAGNOSIS — R0902 Hypoxemia: Secondary | ICD-10-CM

## 2017-01-16 LAB — CBC
HCT: 37.3 % (ref 36.0–46.0)
Hemoglobin: 11.5 g/dL — ABNORMAL LOW (ref 12.0–15.0)
MCH: 30.3 pg (ref 26.0–34.0)
MCHC: 30.8 g/dL (ref 30.0–36.0)
MCV: 98.2 fL (ref 78.0–100.0)
PLATELETS: 266 10*3/uL (ref 150–400)
RBC: 3.8 MIL/uL — ABNORMAL LOW (ref 3.87–5.11)
RDW: 15.6 % — AB (ref 11.5–15.5)
WBC: 10.3 10*3/uL (ref 4.0–10.5)

## 2017-01-16 LAB — GLUCOSE, CAPILLARY
GLUCOSE-CAPILLARY: 84 mg/dL (ref 65–99)
Glucose-Capillary: 198 mg/dL — ABNORMAL HIGH (ref 65–99)

## 2017-01-16 LAB — RENAL FUNCTION PANEL
ALBUMIN: 3 g/dL — AB (ref 3.5–5.0)
Anion gap: 10 (ref 5–15)
BUN: 39 mg/dL — AB (ref 6–20)
CALCIUM: 8.9 mg/dL (ref 8.9–10.3)
CO2: 25 mmol/L (ref 22–32)
CREATININE: 4.66 mg/dL — AB (ref 0.44–1.00)
Chloride: 98 mmol/L — ABNORMAL LOW (ref 101–111)
GFR calc Af Amer: 9 mL/min — ABNORMAL LOW (ref >60)
GFR, EST NON AFRICAN AMERICAN: 8 mL/min — AB (ref >60)
GLUCOSE: 161 mg/dL — AB (ref 65–99)
PHOSPHORUS: 4.9 mg/dL — AB (ref 2.5–4.6)
POTASSIUM: 4.5 mmol/L (ref 3.5–5.1)
SODIUM: 133 mmol/L — AB (ref 135–145)

## 2017-01-16 NOTE — NC FL2 (Signed)
[place] LEVEL OF CARE SCREENING TOOL     IDENTIFICATION  Patient Name: Debbie [name] Birthdate: September 01, 1930 Sex: female Admission Date (Current Location): 01/14/2017  [facility] and [place] Number:  Herbalist and Address:  [facility], [place] [address], [place], [place] [address]      Provider Number: [id number]  Attending Physician Name and Address:  Debbe Odea, MD  Relative Name and Phone Number:  Angell Pincock - [phone]    Current Level of Care: Hospital Recommended Level of Care: [place] (From [place]) Prior Approval Number:    Date Approved/Denied:   PASRR Number:    Discharge Plan: SNF    Current Diagnoses: Patient Active Problem List   Diagnosis Date Noted  . Diverticulosis of colon with hemorrhage   . Hematochezia 01/14/2017  . Pleural effusion 11/15/2016  . Hypotension 11/13/2016  . Bright red blood per rectum 11/13/2016  . Central venous catheter in place   . AKI (acute kidney injury) ([place])   . Hyperkalemia   . Atrial fibrillation ([place])   . Bradycardia   . Renal failure 06/25/2016  . COPD exacerbation ([place]) 06/08/2016  . Diastolic heart failure ([place]) 06/08/2016  . Acute respiratory failure with hypoxia ([place])   . COPD with hypoxia ([place]) 06/02/2016  . Pressure injury of skin 05/29/2016  . Respiratory distress 05/29/2016  . UTI (urinary tract infection) 05/28/2016  . Hypoglycemia 05/28/2016  . Diabetes ([place]) 05/28/2016  . Anxiety 05/28/2016  . Hypertension 05/28/2016  . Diabetes mellitus with complication ([place])   . Renal insufficiency   . Urinary tract infection without hematuria   . 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 Carotid stenosis, asymptomatic 11/28/2014  . Aftercare following surgery of the circulatory system 04/11/2014  . Aftercare following surgery of the circulatory system, [place] 01/31/2014  . Carotid stenosis 01/10/2014  . Occlusion and  stenosis of carotid artery without mention of cerebral infarction 01/10/2014  . Malignant neoplasm of corpus uteri, except isthmus ([place]) 07/07/2011    Orientation RESPIRATION BLADDER Height & Weight     Self, Time, Situation, Place  Normal Continent Weight: 154 lb 12.2 oz (70.2 kg) (standing) Height:  5' (152.4 cm)  BEHAVIORAL SYMPTOMS/MOOD NEUROLOGICAL BOWEL NUTRITION STATUS      Continent Diet (Renal with 1200 mL fluid restriction)  AMBULATORY STATUS COMMUNICATION OF NEEDS Skin   Limited Assist (Min guard per PT) Verbally Normal                       Personal Care Assistance Level of Assistance  Bathing, Feeding, Dressing Bathing Assistance: Limited assistance Feeding assistance: Independent Dressing Assistance: Limited assistance     Functional Limitations Info  Sight, Hearing, Speech Sight Info: Adequate Hearing Info: Adequate Speech Info: Adequate    SPECIAL CARE FACTORS FREQUENCY  PT (By licensed PT)     PT Frequency: Evaluated 8/8 OT Frequency: 3x wk            Contractures Contractures Info: Not present    Additional Factors Info  Code Status, Allergies Code Status Info: DNR Allergies Info: Codeine           Current Medications (01/16/2017):  This is the current hospital active medication list Current Facility-Administered Medications  Medication Dose Route Frequency Provider Last Rate Last Dose  . acetaminophen (TYLENOL) tablet 650 mg  650 mg Oral Q6H PRN Etta Quill, DO       Or  .  acetaminophen (TYLENOL) suppository 650 mg  650 mg Rectal Q6H PRN Etta Quill, DO      . albuterol (PROVENTIL) (2.5 MG/3ML) 0.083% nebulizer solution 3 mL  3 mL Inhalation Q4H PRN Etta Quill, DO      . atorvastatin (LIPITOR) tablet 20 mg  20 mg Oral QPM Jennette Kettle M, DO   20 mg at 01/15/17 2214  . donepezil (ARICEPT) tablet 5 mg  5 mg Oral QHS Jennette Kettle M, DO   5 mg at 01/15/17 2214  . feeding supplement (PRO-STAT SUGAR FREE 64) liquid 30 mL   30 mL Oral BID Jennette Kettle M, DO   30 mL at 01/16/17 1100  . guaiFENesin (MUCINEX) 12 hr tablet 600 mg  600 mg Oral BID Jennette Kettle M, DO   600 mg at 01/16/17 1100  . midodrine (PROAMATINE) tablet 10 mg  10 mg Oral BID WC Jennette Kettle M, DO   10 mg at 01/16/17 3383  . ondansetron (ZOFRAN) tablet 4 mg  4 mg Oral Q6H PRN Etta Quill, DO       Or  . ondansetron [facility]) injection 4 mg  4 mg Intravenous Q6H PRN Etta Quill, DO      . saccharomyces boulardii (FLORASTOR) capsule 250 mg  250 mg Oral BID Jennette Kettle M, DO   250 mg at 01/16/17 1059  . sodium chloride (OCEAN) 0.65 % nasal spray 1 spray  1 spray Each Nare PRN Etta Quill, DO         Discharge Medications: Please see discharge summary for a list of discharge medications.  Relevant Imaging Results:  Relevant Lab Results:   Additional Information Dialysis patient MWF at [facility], Mila Homer, LCSW

## 2017-01-16 NOTE — Progress Notes (Signed)
Patient has discharge order to go to [facility], gave report to [place] Nurse and answered all her questions.  Discontinued IV, site clean and dry.  Son at bedside.  PTAR called by the SW, waiting for them to pick the patient.  Oncoming nurse made aware of the situation.

## 2017-01-16 NOTE — Progress Notes (Signed)
Palliative consult received. Discharge plans noted. Please make Palliative Care Referral at SNF. If full inpatient consultation needed please call [phone].  Lane Hacker, DO Palliative Medicine

## 2017-01-16 NOTE — Discharge Summary (Signed)
Physician Discharge Summary  Debbie [name] MGQ:[id number] DOB: [birth year] DOA: 01/14/2017  PCP: [name], MD  Admit date: 01/14/2017 Discharge date: 01/16/2017  Admitted From: SNF Disposition:  SNF   Recommendations for Outpatient Follow-up:  1. Check Hb in 1 wk  Discharge Condition:  stable CODE STATUS:  DNR   Consultations:  GI    Discharge Diagnoses:  Principal Problem:   Hematochezia Active Problems:   Diabetes mellitus with complication (HCC)   COPD with hypoxia ([place])   Hypotension   Diverticulosis of colon with hemorrhage    Subjective: No bloody BM since the small one she had yesterday. No vomiting or abdominal pain.   Brief Summary: Debbie [name] is a 83 y.o.femalewith medical history significant of ESRD dialysis MWF, last dialysis was earlier today. Patient sent in from SNF after 1 episode of bloody BM, maroon colored stool. This occurred at about 5pm. No abdominal pain. No dizziness.   Hospital Course:  Principal Problem:   Hematochezia/ Diverticulosis of colon with hemorrhage - sigmoidoscopy suggestive of sigmoid diverticular bleed  - Hb is stable  Active Problems:  Stricture in distal sigmoid-  - noted on colonoscopy      COPD with hypoxia (HCC) - cont O2- no wheezing    Hypotension - Midodrine  ESRD on HD - MWF  Dementia - Aricept   Discharge Instructions  Discharge Instructions    Diet general    Complete by:  As directed    Renal low sodium heart healthy diet   Increase activity slowly    Complete by:  As directed      Allergies as of 01/16/2017      Reactions   Codeine Other (See Comments)   Unusual mouth sensation      Medication List    STOP taking these medications   aspirin EC 81 MG tablet   saccharomyces boulardii 250 MG capsule Commonly known as:  FLORASTOR     TAKE these medications   Albuterol Sulfate 108 (90 Base) MCG/ACT Aepb Commonly known as:  PROAIR RESPICLICK Inhale 1-2 puffs into  the lungs every 4 (four) hours as needed. What changed:  reasons to take this   atorvastatin 20 MG tablet Commonly known as:  LIPITOR Take 20 mg by mouth every evening. (1700)   donepezil 5 MG tablet Commonly known as:  ARICEPT Take 5 mg by mouth at bedtime. (2100)   feeding supplement (PRO-STAT SUGAR FREE 64) Liqd Take 30 mLs by mouth 2 (two) times daily. What changed:  additional instructions   guaiFENesin 600 MG 12 hr tablet Commonly known as:  MUCINEX Take 1 tablet (600 mg total) by mouth 2 (two) times daily as needed. What changed:  when to take this   midodrine 10 MG tablet Commonly known as:  PROAMATINE Take 1 tablet (10 mg total) by mouth 2 (two) times daily with a meal. What changed:  additional instructions   NEPHRO-VITE PO Take 1 tablet by mouth daily. (1700)   senna-docusate 8.6-50 MG tablet Commonly known as:  Senokot-S Take 2 tablets by mouth 2 (two) times daily. What changed:  additional instructions       Allergies  Allergen Reactions  . Codeine Other (See Comments)    Unusual mouth sensation     Procedures/Studies: 8/9 sigmoidoscopy                - Blood found on digital rectal exam.                           -  Blood in the rectum.                           - Diverticulosis in the sigmoid colon. Bloody                            mucous then clears - so think distal sigmoid                            diverticular bleed - though I could not go as far                            proximal as desired due to fixed sigmoid colon and                            stenosis. Would use a smaller more flexible scope                            if need to look again (ultraslim colonoscope vs                            gastroscope)                           - Stricture in the distal sigmoid colon.                           - No specimens collected.    No results found.     Discharge Exam: Vitals:   01/15/17 2027 01/16/17 0535  BP: (!) 83/59 110/74   Pulse: 68 75  Resp: 19 20  Temp: 98.6 F (37 C) 98.1 F (36.7 C)  SpO2: 90% 98%   Vitals:   01/15/17 1525 01/15/17 1540 01/15/17 2027 01/16/17 0535  BP: (!) 63/27 (!) 100/30 (!) 83/59 110/74  Pulse: (!) 44 72 68 75  Resp: 19 16 19 20   Temp:   98.6 F (37 C) 98.1 F (36.7 C)  TempSrc:   Oral Oral  SpO2: 95% 92% 90% 98%  Weight:   68.1 kg (150 lb 2.1 oz)     General: Pt is alert, awake, not in acute distress Cardiovascular: RRR, S1/S2 +, no rubs, no gallops Respiratory: CTA bilaterally, no wheezing, no rhonchi Abdominal: Soft, NT, ND, bowel sounds + Extremities: no edema, no cyanosis    The results of significant diagnostics from this hospitalization (including imaging, microbiology, ancillary and laboratory) are listed below for reference.     Microbiology: Recent Results (from the past 240 hour(s))  MRSA PCR Screening     Status: None   Collection Time: 01/15/17 10:38 AM  Result Value Ref Range Status   MRSA by PCR NEGATIVE NEGATIVE Final    Comment:        The GeneXpert MRSA Assay (FDA approved for NASAL specimens only), is one component of a comprehensive MRSA colonization surveillance program. It is not intended to diagnose MRSA infection nor to guide or monitor treatment for MRSA infections.      Labs: BNP (last 3 results)  Recent Labs  06/08/16 0751  BNP 409.8*   Basic Metabolic Panel:  Recent Labs Lab 01/13/17 0742 01/14/17 1823 01/15/17 0302  NA 137 134* 136  K 4.1 3.9 4.2  CL 100* 98* 100*  CO2  --  26 29  GLUCOSE 99 126* 90  BUN 26* 10 13  CREATININE 3.20* 1.84* 2.55*  CALCIUM  --  7.9* 8.5*   Liver Function Tests:  Recent Labs Lab 01/14/17 1823  AST 24  ALT 11*  ALKPHOS 79  BILITOT 0.6  PROT 5.9*  ALBUMIN 2.9*   No results for input(s): LIPASE, AMYLASE in the last 168 hours. No results for input(s): AMMONIA in the last 168 hours. CBC:  Recent Labs Lab 01/13/17 0742 01/14/17 1823 01/15/17 0302  WBC  --  8.9 8.1   HGB 13.3 11.2* 10.7*  HCT 39.0 35.6* 33.5*  MCV  --  98.1 97.1  PLT  --  207 207   Cardiac Enzymes: No results for input(s): CKTOTAL, CKMB, CKMBINDEX, TROPONINI in the last 168 hours. BNP: Invalid input(s): POCBNP CBG:  Recent Labs Lab 01/15/17 0347 01/15/17 0821 01/15/17 1219 01/15/17 2025 01/16/17 0755  GLUCAP 88 106* 108* 103* 84   D-Dimer No results for input(s): DDIMER in the last 72 hours. Hgb A1c No results for input(s): HGBA1C in the last 72 hours. Lipid Profile No results for input(s): CHOL, HDL, LDLCALC, TRIG, CHOLHDL, LDLDIRECT in the last 72 hours. Thyroid function studies No results for input(s): TSH, T4TOTAL, T3FREE, THYROIDAB in the last 72 hours.  Invalid input(s): FREET3 Anemia work up No results for input(s): VITAMINB12, FOLATE, FERRITIN, TIBC, IRON, RETICCTPCT in the last 72 hours. Urinalysis    Component Value Date/Time   COLORURINE YELLOW 06/26/2016 0028   APPEARANCEUR CLOUDY (A) 06/26/2016 0028   LABSPEC 1.013 06/26/2016 0028   PHURINE 5.0 06/26/2016 0028   GLUCOSEU 50 (A) 06/26/2016 0028   HGBUR NEGATIVE 06/26/2016 0028   BILIRUBINUR NEGATIVE 06/26/2016 0028   KETONESUR NEGATIVE 06/26/2016 0028   PROTEINUR 100 (A) 06/26/2016 0028   UROBILINOGEN 1.0 03/07/2014 1510   NITRITE NEGATIVE 06/26/2016 0028   LEUKOCYTESUR TRACE (A) 06/26/2016 0028   Sepsis Labs Invalid input(s): PROCALCITONIN,  WBC,  LACTICIDVEN Microbiology Recent Results (from the past 240 hour(s))  MRSA PCR Screening     Status: None   Collection Time: 01/15/17 10:38 AM  Result Value Ref Range Status   MRSA by PCR NEGATIVE NEGATIVE Final    Comment:        The GeneXpert MRSA Assay (FDA approved for NASAL specimens only), is one component of a comprehensive MRSA colonization surveillance program. It is not intended to diagnose MRSA infection nor to guide or monitor treatment for MRSA infections.      Time coordinating discharge: Over 30  minutes  SIGNED:   Debbe Odea, MD  [facility] 01/16/2017, 8:57 AM Pager   If 7PM-7AM, please contact night-coverage [email or web address] Password [email or web address]

## 2017-01-16 NOTE — Progress Notes (Signed)
          Daily Rounding Note  01/16/2017, 8:15 AM  LOS: 2 days   SUBJECTIVE:   Chief complaint: passing dark blood, last episode was early yesterday.  Still no abd pain, nausea or GI distress.  Not dizzy or weak.    Quiet night.    OBJECTIVE:         Vital signs in last 24 hours:    Temp:  [97.7 F (36.5 C)-98.6 F (37 C)] 98.1 F (36.7 C) (08/10 0535) Pulse Rate:  [44-92] 75 (08/10 0535) Resp:  [16-20] 20 (08/10 0535) BP: (62-123)/(25-80) 110/74 (08/10 0535) SpO2:  [90 %-100 %] 98 % (08/10 0535) Weight:  [68.1 kg (150 lb 2.1 oz)] 68.1 kg (150 lb 2.1 oz) (08/09 2027) Last BM Date: 01/15/17 Filed Weights   01/15/17 2027  Weight: 68.1 kg (150 lb 2.1 oz)   Performed visual, not manual exam.   General: looks comfortable and not ill   Chest: unlabored breathing Neuro/Psych:  Pleasant, calm, cooperative.  No gross deficits  Lab Results:  Recent Labs  01/14/17 1823 01/15/17 0302  WBC 8.9 8.1  HGB 11.2* 10.7*  HCT 35.6* 33.5*  PLT 207 207    ASSESMENT:   *  Painless hematochezia.    8/9 unprepped flex sig with visible blood, sigmoid tics, fixed sigmoid stenosis/stricture.  Dr Celesta Aver opinion is this is a diverticular bleed. 11/13/16 CT with ventral hernia, rectosigmoid diverticulosis.    *  Chronic, normocytic anemia.  Dates back to 05/2016.  With her ESRD, suspect anemia of renal/chronic disease.  Serum Hgb's relatively stable 11.2 >> 10.7 (Istat level aberrant at 13.3). Overall Hgb better than average.      PLAN   *  HD today, home after that: agree with Dr Reggy Eye plan.   A "bloody" stool in interim may not preclude discharge.  If it is c/w old blood, could go home.   If fresh blood, would keep overnight.     Azucena Freed  01/16/2017, 8:15 AM Pager: [phone]    [facility] Attending   I have taken an interval history, reviewed the chart and examined the patient. I agree with the Advanced Practitioner's  note, impression and recommendations.     No further bleeding  She may pass some more old blood and was warned.  Call if other ?  Does not need GI outpatient f/u after this admit - if she has more bleeding could need full colonoscopy with ultraslim colonoscope  Gatha Mayer, MD, [facility] [phone] (pager) 01/16/2017 12:04 PM

## 2017-01-16 NOTE — Progress Notes (Signed)
Subjective:  Eating brk in bedside chair  No cos , no  BM  this am / for HD today / ? Dc if stable  After HD  Objective Vital signs in last 24 hours: Vitals:   01/15/17 1540 01/15/17 2027 01/16/17 0535 01/16/17 0926  BP: (!) 100/30 (!) 83/59 110/74 99/60  Pulse: 72 68 75 60  Resp: 16 19 20 18   Temp:  98.6 F (37 C) 98.1 F (36.7 C) 98 F (36.7 C)  TempSrc:  Oral Oral Oral  SpO2: 92% 90% 98% 94%  Weight:  68.1 kg (150 lb 2.1 oz)     Weight change:   Physical Exam: General:  Pleasant , NAD Well developed elderly WF . Lungs: CTA Bilat . Breathing is unlabored. Heart: RRR; 1/6 systolic murmur.no rub Abdomen: Soft, non-tender, non-distended with normoactive bowel sounds. . Lower extremities: No LE edema or ischemic changes. Dialysis Access: TDC in R chest, small RUE AVF + thrill    Dialysis Orders:  MWF at [facility], BFR 400, DFR 800, EDW 72kg, 4K/2.25Ca bath, TDC, Heparin 2200 bolus - Venofer 50mg  IV weekly - No ESA, VDRA currently. Not on binders.  Problem/Plan: 1.  Hematochezia/LGIB: unprepped flex sig with visible blood, sigmoid tics, fixed sigmoid stenosis/stricture.  Dr Celesta Aver opinion is this is a diverticular bleed.No Hep herniated HD 2.  ESRD: Continue HD per MWF schedule, no heparin. 3.  BP/volume: Typically hypotensive, on midodrine BID. No edema./ (below edw 72)   Wt =68.1 kg  yest  4. Anemia: Hgb 13.3>11.2 >10.7 in setting of GIB. If drops further, will start ESA (has not needed in some time). 5.  Metabolic bone disease: Corr Ca 9.4. Not on binders or VDRA. 6.  Nutrition: Alb 2.9 , on  protein  Supplement / add renal vit.   Ernest Haber, PA-C [facility] Beeper [phone] 01/16/2017,9:48 AM  LOS: 2 days   Pt seen, examined and agree w A/P as above.  For dc most likely today after HD.   Kelly Splinter MD [facility] pager [phone]   01/16/2017, 12:13 PM    Labs: Basic Metabolic Panel:  Recent Labs Lab  01/13/17 0742 01/14/17 1823 01/15/17 0302  NA 137 134* 136  K 4.1 3.9 4.2  CL 100* 98* 100*  CO2  --  26 29  GLUCOSE 99 126* 90  BUN 26* 10 13  CREATININE 3.20* 1.84* 2.55*  CALCIUM  --  7.9* 8.5*   Liver Function Tests:  Recent Labs Lab 01/14/17 1823  AST 24  ALT 11*  ALKPHOS 79  BILITOT 0.6  PROT 5.9*  ALBUMIN 2.9*    CBC:  Recent Labs Lab 01/13/17 0742 01/14/17 1823 01/15/17 0302  WBC  --  8.9 8.1  HGB 13.3 11.2* 10.7*  HCT 39.0 35.6* 33.5*  MCV  --  98.1 97.1  PLT  --  207 207  CBG:  Recent Labs Lab 01/15/17 0347 01/15/17 0821 01/15/17 1219 01/15/17 2025 01/16/17 0755  GLUCAP 88 106* 108* 103* 84    Studies/Results: No results found. Medications: . sodium chloride    . sodium chloride     . atorvastatin  20 mg Oral QPM  . donepezil  5 mg Oral QHS  . feeding supplement (PRO-STAT SUGAR FREE 64)  30 mL Oral BID  . guaiFENesin  600 mg Oral BID  . midodrine  10 mg Oral BID WC  . saccharomyces boulardii  250 mg Oral BID

## 2017-01-16 NOTE — Clinical Social Work Note (Signed)
Patient medically stable for discharge back to [facility] where she is a LTC resident. Facility notified and d/c summary transmitted via the [facility]. Son Marya Amsler contacted regarding discharge and was asked to contact Narda Rutherford at [facility] regarding readmission paperwork. Ms. Billiot will be transported by ambulance back to facility. Nurse provided with number to call report.  CSW signing off as patient discharging back to SNF this evening and no further CSW interventions needed.  Braelyn Bordonaro Givens, MSW, LCSW Licensed Clinical Social Worker [place] [phone]

## 2017-01-16 NOTE — Progress Notes (Signed)
Report given to [facility] at the bedside. Pt rested well during the night with no calls nor made any request.

## 2017-01-16 NOTE — Discharge Instructions (Signed)
Resume Aspirin on 8/17.  Please take all your medications with you for your next visit with your Primary MD. Please request your Primary MD to go over all hospital test results at the follow up. Please ask your Primary MD to get all Hospital records sent to his/her office.  If you experience worsening of your admission symptoms, develop shortness of breath, chest pain, suicidal or homicidal thoughts or a life threatening emergency, you must seek medical attention immediately by calling 911 or calling your MD.  Dennis Bast must read the complete instructions/literature along with all the possible adverse reactions/side effects for all the medicines you take including new medications that have been prescribed to you. Take new medicines after you have completely understood and accpet all the possible adverse reactions/side effects.   Do not drive when taking pain medications or sedatives.  Do not take more than prescribed Pain, Sleep and Anxiety Medications  If you have smoked or chewed Tobacco in the last 2 yrs please stop. Stop any regular alcohol and or recreational drug use.  Wear Seat belts while driving.

## 2017-01-16 NOTE — Evaluation (Signed)
Physical Therapy Evaluation Patient Details Name: Debbie [name] MRN: [id number] DOB: August 20, 1930 Today's Date: 01/16/2017   History of Present Illness  Pt is 83 y/o female admitted through ED from [facility] on 01/14/17 secondary to rectal bleeding. pt underwent an sigmoidoscopy on 01/15/17 and was diagnosed with diverticulosis of large intestine without perforation. PMH includes dementia, Renal failure on HD MWF, HTN, PVD, cataracts, R posterior heel ulcer, COPD, DM and L AV fistula placement.   Clinical Impression  Pt presents with the above diagnosis and below deficits for therapy evaluation. Prior to admission, pt was at a SNF for short term rehab. Pt lived with her two sons and grandson in a single level home before going to rehab. Pt would like to return home but will benefit from continuation of rehab in order to address remaining deficits. Pt requires supervision to min guard for mobility this session and has definitive cognitive deficits noted this session. Pt will benefit from continued acute PT follow-up in order to address the below deficits prior to D/C.     Follow Up Recommendations SNF    Equipment Recommendations  None recommended by PT    Recommendations for Other Services       Precautions / Restrictions Precautions Precautions: Fall Restrictions Weight Bearing Restrictions: No      Mobility  Bed Mobility Overal bed mobility: Needs Assistance Bed Mobility: Sit to Supine       Sit to supine: Supervision   General bed mobility comments: supervision for safety  Transfers Overall transfer level: Needs assistance Equipment used: Rolling walker (2 wheeled) Transfers: Sit to/from Stand Sit to Stand: Supervision         General transfer comment: supervision for safety from recliner  Ambulation/Gait Ambulation/Gait assistance: Supervision;Min guard Ambulation Distance (Feet): 50 Feet Assistive device: Rolling walker (2 wheeled) Gait Pattern/deviations:  Step-through pattern;Decreased step length - right;Decreased step length - left Gait velocity: decreased Gait velocity interpretation: Below normal speed for age/gender General Gait Details: slow, steady gait with RW. No LOB   Stairs            Wheelchair Mobility    Modified Rankin (Stroke Patients Only)       Balance Overall balance assessment: Needs assistance Sitting-balance support: No upper extremity supported;Feet supported Sitting balance-Leahy Scale: Good     Standing balance support: Bilateral upper extremity supported;No upper extremity supported Standing balance-Leahy Scale: Fair Standing balance comment: able to stand statically briefly without UE support                             Pertinent Vitals/Pain Pain Assessment: No/denies pain    Home Living Family/patient expects to be discharged to:: Skilled nursing facility                 Additional Comments: plans to return home from SNF    Prior Function Level of Independence: Needs assistance   Gait / Transfers Assistance Needed: pt reports using a RW for mobility  ADL's / Homemaking Assistance Needed: pt's son reported she needed assistance with bathing and dressing        Hand Dominance   Dominant Hand: Right    Extremity/Trunk Assessment   Upper Extremity Assessment Upper Extremity Assessment: Overall WFL for tasks assessed    Lower Extremity Assessment Lower Extremity Assessment: Overall WFL for tasks assessed    Cervical / Trunk Assessment Cervical / Trunk Assessment: Normal  Communication   Communication: No difficulties  Cognition Arousal/Alertness: Awake/alert Behavior During Therapy: WFL for tasks assessed/performed Overall Cognitive Status: No family/caregiver present to determine baseline cognitive functioning                                        General Comments      Exercises     Assessment/Plan    PT Assessment Patient needs  continued PT services  PT Problem List Decreased strength;Decreased activity tolerance;Decreased balance;Decreased mobility;Decreased knowledge of use of DME;Decreased cognition       PT Treatment Interventions DME instruction;Gait training;Functional mobility training;Therapeutic activities;Therapeutic exercise;Balance training    PT Goals (Current goals can be found in the Care Plan section)  Acute Rehab PT Goals Patient Stated Goal: to get back home eventually PT Goal Formulation: Patient unable to participate in goal setting Time For Goal Achievement: 01/23/17 Potential to Achieve Goals: Good    Frequency Min 2X/week   Barriers to discharge        Co-evaluation               AM-PAC PT "6 Clicks" Daily Activity  Outcome Measure Difficulty turning over in bed (including adjusting bedclothes, sheets and blankets)?: None Difficulty moving from lying on back to sitting on the side of the bed? : None Difficulty sitting down on and standing up from a chair with arms (e.g., wheelchair, bedside commode, etc,.)?: A Little Help needed moving to and from a bed to chair (including a wheelchair)?: A Little Help needed walking in hospital room?: A Little Help needed climbing 3-5 steps with a railing? : A Lot 6 Click Score: 19    End of Session Equipment Utilized During Treatment: Gait belt Activity Tolerance: Patient tolerated treatment well Patient left: in bed;with call bell/phone within reach;with bed alarm set Nurse Communication: Mobility status PT Visit Diagnosis: Muscle weakness (generalized) (M62.81);Difficulty in walking, not elsewhere classified (R26.2)    Time: 8182-9937 PT Time Calculation (min) (ACUTE ONLY): 18 min   Charges:   PT Evaluation $PT Eval Moderate Complexity: 1 Mod     PT G Codes:        Scheryl Marten PT, DPT  [phone]   Jacqulyn Liner Sloan Leiter 01/16/2017, 1:27 PM

## 2017-01-17 MED ORDER — SODIUM CHLORIDE 0.9 % IV BOLUS (SEPSIS)
500.0000 mL | Freq: Once | INTRAVENOUS | Status: AC
  Administered 2017-01-17: 500 mL via INTRAVENOUS

## 2017-01-17 NOTE — Progress Notes (Signed)
Alric Ran to be D/C'd Skilled nursing facility per MD order.  Discussed prescriptions and follow up appointments with the patient. Prescriptions given to patient, medication list explained in detail. Pt verbalized understanding.  Allergies as of 01/17/2017      Reactions   Codeine Other (See Comments)   Unusual mouth sensation      Medication List    STOP taking these medications   aspirin EC 81 MG tablet   saccharomyces boulardii 250 MG capsule Commonly known as:  FLORASTOR     TAKE these medications   Albuterol Sulfate 108 (90 Base) MCG/ACT Aepb Commonly known as:  PROAIR RESPICLICK Inhale 1-2 puffs into the lungs every 4 (four) hours as needed. What changed:  reasons to take this   atorvastatin 20 MG tablet Commonly known as:  LIPITOR Take 20 mg by mouth every evening. (1700)   donepezil 5 MG tablet Commonly known as:  ARICEPT Take 5 mg by mouth at bedtime. (2100)   feeding supplement (PRO-STAT SUGAR FREE 64) Liqd Take 30 mLs by mouth 2 (two) times daily. What changed:  additional instructions   guaiFENesin 600 MG 12 hr tablet Commonly known as:  MUCINEX Take 1 tablet (600 mg total) by mouth 2 (two) times daily as needed. What changed:  when to take this   midodrine 10 MG tablet Commonly known as:  PROAMATINE Take 1 tablet (10 mg total) by mouth 2 (two) times daily with a meal. What changed:  additional instructions   NEPHRO-VITE PO Take 1 tablet by mouth daily. (1700)   senna-docusate 8.6-50 MG tablet Commonly known as:  Senokot-S Take 2 tablets by mouth 2 (two) times daily. What changed:  additional instructions       Vitals:   01/17/17 0645 01/17/17 0806  BP: (!) 78/53 (!) 90/54  Pulse: 61 63  Resp:    Temp:  98.3 F (36.8 C)  SpO2: 94% 96%    Skin clean, dry and intact without evidence of skin break down, no evidence of skin tears noted. IV catheter discontinued intact. Site without signs and symptoms of complications. Dressing and  pressure applied. Pt denies pain at this time. No complaints noted.  An After Visit Summary was printed and given to the patient. Patient escorted via stretcher and transferred with PTAR to SNF Haywood Lasso BSN, RN [facility] Phone [phone]

## 2017-01-17 NOTE — Care Management Note (Signed)
Case Management Note  Patient Details  Name: Debbie [name] MRN: [id number] Date of Birth: 1931-01-23  Subjective/Objective:                 Patient will return to [facility] where she is a LTC resident.   Action/Plan:   Expected Discharge Date:  01/17/17               Expected Discharge Plan:  Skilled Nursing Facility  In-House Referral:  Clinical Social Work  Discharge planning Services  CM Consult  Post Acute Care Choice:    Choice offered to:     DME Arranged:    DME Agency:     HH Arranged:    [place] Agency:     Status of Service:  Completed, signed off  If discussed at [employer], dates discussed:    Additional Comments:  Carles Collet, RN 01/17/2017, 8:52 AM

## 2017-01-17 NOTE — Progress Notes (Signed)
Patient not discharged yesterday. BP was low and discharge cancelled by night NP. Has received fluid boluses this AM and is stable to go to SNF.   Debbie Odea, MD

## 2017-01-17 NOTE — Progress Notes (Signed)
Pt's bp has been very low along the manual result..78/52. hospitalist was informed since [facility] are here to transport pt to the SNF. It was decided that pt is not able to be transported till bp has to be within acceptable range. Son will be informed of the decision made and will cont to monitor. Fluids provided and she will be monitored closely.

## 2017-01-17 NOTE — Progress Notes (Signed)
Subjective:  Had HD last night. Hb 11 today.  No bloody stools.   Objective Vital signs in last 24 hours: Vitals:   01/16/17 2222 01/17/17 0435 01/17/17 0645 01/17/17 0806  BP: (!) 85/55 (!) 66/39 (!) 78/53 (!) 90/54  Pulse:  64 61 63  Resp:      Temp:  97.8 F (36.6 C)  98.3 F (36.8 C)  TempSrc:  Oral  Oral  SpO2:  98% 94% 96%  Weight:      Height:       Weight change: 2.661 kg (5 lb 13.9 oz)  Physical Exam: General:  Pleasant , NAD Well developed elderly WF . Lungs: CTA Bilat . Breathing is unlabored. Heart: RRR; 1/6 systolic murmur.no rub Abdomen: Soft, non-tender, non-distended with normoactive bowel sounds. . Lower extremities: No LE edema or ischemic changes. Dialysis Access: TDC in R chest, small RUE AVF + thrill    Dialysis Orders:  MWF at [facility], BFR 400, DFR 800, EDW 72kg, 4K/2.25Ca bath, TDC, Heparin 2200 bolus - Venofer 50mg  IV weekly - No ESA, VDRA currently. Not on binders.  Problem/Plan: 1.  Hematochezia/LGIB: unprepped flex sig with visible blood, sigmoid tics, fixed sigmoid stenosis/stricture. Per GI prob diverticular bleed.  Hb stable 11.  2.  ESRD: Continue HD per MWF schedule, no heparin. 3.  BP/volume: Typically hypotensive, on midodrine BID.  4. Anemia: Hgb 13.3>11.2 >10.7 in setting of GIB. If drops further, will start ESA (has not needed in some time). 5.  Metabolic bone disease: Corr Ca 9.4. Not on binders or VDRA. 6.  Nutrition: Alb 2.9 , on  protein  Supplement / add renal vit 7. Dispo - per primary, stable from renal standpoint.     Kelly Splinter MD [facility] pager [phone]   01/17/2017, 9:34 AM    Labs: Basic Metabolic Panel:  Recent Labs Lab 01/14/17 1823 01/15/17 0302 01/16/17 1040  NA 134* 136 133*  K 3.9 4.2 4.5  CL 98* 100* 98*  CO2 26 29 25   GLUCOSE 126* 90 161*  BUN 10 13 39*  CREATININE 1.84* 2.55* 4.66*  CALCIUM 7.9* 8.5* 8.9  PHOS  --   --  4.9*   Liver Function  Tests:  Recent Labs Lab 01/14/17 1823 01/16/17 1040  AST 24  --   ALT 11*  --   ALKPHOS 79  --   BILITOT 0.6  --   PROT 5.9*  --   ALBUMIN 2.9* 3.0*    CBC:  Recent Labs Lab 01/14/17 1823 01/15/17 0302 01/16/17 1040  WBC 8.9 8.1 10.3  HGB 11.2* 10.7* 11.5*  HCT 35.6* 33.5* 37.3  MCV 98.1 97.1 98.2  PLT 207 207 266  CBG:  Recent Labs Lab 01/15/17 0821 01/15/17 1219 01/15/17 2025 01/16/17 0755 01/16/17 1124  GLUCAP 106* 108* 103* 84 198*    Studies/Results: No results found. Medications: . sodium chloride     . atorvastatin  20 mg Oral QPM  . donepezil  5 mg Oral QHS  . feeding supplement (PRO-STAT SUGAR FREE 64)  30 mL Oral BID  . guaiFENesin  600 mg Oral BID  . midodrine  10 mg Oral BID WC  . saccharomyces boulardii  250 mg Oral BID

## 2017-01-17 NOTE — Progress Notes (Signed)
Son Mr. Gage was informed about the result of the bp before her dc to snf which was very low. hospitalist was informed and she d/c the discharge order and will reassess the cond this morning.

## 2017-01-17 NOTE — Progress Notes (Signed)
Report called to [facility]. PTAR has been notified by Social Work to come pick up patient.

## 2017-01-18 ENCOUNTER — Encounter (HOSPITAL_COMMUNITY): Payer: Self-pay | Admitting: Internal Medicine

## 2017-01-27 ENCOUNTER — Encounter (HOSPITAL_COMMUNITY): Payer: Self-pay | Admitting: *Deleted

## 2017-01-28 ENCOUNTER — Encounter (HOSPITAL_COMMUNITY): Payer: Self-pay | Admitting: *Deleted

## 2017-01-28 ENCOUNTER — Other Ambulatory Visit: Payer: Self-pay | Admitting: *Deleted

## 2017-01-28 MED ORDER — DEXTROSE 5 % IV SOLN
1.5000 g | INTRAVENOUS | Status: AC
  Administered 2017-01-29: 1.5 g via INTRAVENOUS
  Filled 2017-01-28: qty 1.5

## 2017-01-28 NOTE — Pre-Procedure Instructions (Signed)
    Debbie [name]  01/28/2017    [name]' procedure is scheduled on Thursday, January 29, 2017 at 9:45 AM.   Report to [facility] Entrance "A" Admitting Office at 7:15 AM.   Call this number if you have problems the morning of surgery: [phone]     Remember:  Patient is not to eat food or drink liquids after midnight tonight.  Patient may use her inhaler in the AM as needed. If Midodrine can be given to patient before she leaves, please have her take it with a small sip of water.   Do not wear jewelry, make-up or nail polish.  Do not wear lotions, powders, perfumes or deodrant.  Do not shave 48 hours prior to surgery.    Do not bring valuables to the hospital.  [facility] is not responsible for any belongings or valuables.  Contacts, dentures or bridgework may not be worn into surgery.  Leave your suitcase in the car.  After surgery it may be brought to your room.  For patients admitted to the hospital, discharge time will be determined by your treatment team.  Patients discharged the day of surgery will not be allowed to drive home.   Please send copy of MAR with patient tomorrow.  Any questions today, please call me, Lilia Pro, RN at [phone].

## 2017-01-28 NOTE — Progress Notes (Signed)
Pt is a resident at [facility] SNF. Spoke with Ranelle Oyster, LPN for pre-op call. She verified pt's allergies, medications and medical history. She states pt is alert and able to speak for herself. Pt is diabetic, type 2. [facility] could not find an A1C and states pt does not have an order for CBG's at SNF. Pt will be arriving via [employer] and a sitter will be meeting her here. Faxed pre-op instructions to [facility] at [phone].

## 2017-01-29 ENCOUNTER — Telehealth: Payer: Self-pay | Admitting: Surgery

## 2017-01-29 ENCOUNTER — Encounter (HOSPITAL_COMMUNITY)
Admission: RE | Disposition: A | Discharge status: Skilled Nursing Facility | Payer: Self-pay | Source: Ambulatory Visit | Attending: Surgery

## 2017-01-29 ENCOUNTER — Ambulatory Visit (HOSPITAL_COMMUNITY)
Admission: RE | Admit: 2017-01-29 | Discharge: 2017-01-29 | Disposition: A | Discharge status: Skilled Nursing Facility | Payer: Medicare HMO | Source: Ambulatory Visit | Attending: Surgery | Admitting: Surgery

## 2017-01-29 ENCOUNTER — Encounter (HOSPITAL_COMMUNITY): Payer: Self-pay | Admitting: Urology

## 2017-01-29 ENCOUNTER — Ambulatory Visit (HOSPITAL_COMMUNITY): Payer: Medicare HMO | Admitting: Anesthesiology

## 2017-01-29 DIAGNOSIS — F419 Anxiety disorder, unspecified: Secondary | ICD-10-CM | POA: Diagnosis not present

## 2017-01-29 DIAGNOSIS — I120 Hypertensive chronic kidney disease with stage 5 chronic kidney disease or end stage renal disease: Secondary | ICD-10-CM | POA: Diagnosis not present

## 2017-01-29 DIAGNOSIS — J449 Chronic obstructive pulmonary disease, unspecified: Secondary | ICD-10-CM | POA: Diagnosis not present

## 2017-01-29 DIAGNOSIS — N186 End stage renal disease: Secondary | ICD-10-CM | POA: Diagnosis present

## 2017-01-29 DIAGNOSIS — T82898A Other specified complication of vascular prosthetic devices, implants and grafts, initial encounter: Secondary | ICD-10-CM | POA: Insufficient documentation

## 2017-01-29 HISTORY — DX: Heart failure, unspecified: I50.9

## 2017-01-29 HISTORY — DX: Hypotension, unspecified: I95.9

## 2017-01-29 HISTORY — PX: FISTULA SUPERFICIALIZATION: SHX6341

## 2017-01-29 HISTORY — DX: Hemorrhage of anus and rectum: K62.5

## 2017-01-29 HISTORY — DX: Dyspnea, unspecified: R06.00

## 2017-01-29 HISTORY — DX: Unspecified dementia, unspecified severity, without behavioral disturbance, psychotic disturbance, mood disturbance, and anxiety: F03.90

## 2017-01-29 HISTORY — DX: Pneumonia, unspecified organism: J18.9

## 2017-01-29 HISTORY — DX: Chronic kidney disease, unspecified: N18.9

## 2017-01-29 HISTORY — DX: Chronic obstructive pulmonary disease, unspecified: J44.9

## 2017-01-29 HISTORY — DX: Cardiac arrhythmia, unspecified: I49.9

## 2017-01-29 LAB — GLUCOSE, CAPILLARY
GLUCOSE-CAPILLARY: 121 mg/dL — AB (ref 65–99)
GLUCOSE-CAPILLARY: 97 mg/dL (ref 65–99)

## 2017-01-29 LAB — POCT I-STAT 4, (NA,K, GLUC, HGB,HCT)
Glucose, Bld: 114 mg/dL — ABNORMAL HIGH (ref 65–99)
HCT: 29 % — ABNORMAL LOW (ref 36.0–46.0)
Hemoglobin: 9.9 g/dL — ABNORMAL LOW (ref 12.0–15.0)
Potassium: 3.3 mmol/L — ABNORMAL LOW (ref 3.5–5.1)
Sodium: 138 mmol/L (ref 135–145)

## 2017-01-29 SURGERY — FISTULA SUPERFICIALIZATION
Anesthesia: General | Site: Arm Upper | Laterality: Right

## 2017-01-29 MED ORDER — SODIUM CHLORIDE 0.9 % IV BOLUS (SEPSIS)
250.0000 mL | Freq: Once | INTRAVENOUS | Status: AC
  Administered 2017-01-29: 250 mL via INTRAVENOUS

## 2017-01-29 MED ORDER — SODIUM CHLORIDE 0.9 % IV SOLN
INTRAVENOUS | Status: DC | PRN
  Administered 2017-01-29: 09:00:00 via INTRAVENOUS

## 2017-01-29 MED ORDER — OXYCODONE-ACETAMINOPHEN 5-325 MG PO TABS: 0.5000 | ORAL_TABLET | Freq: Four times a day (QID) | ORAL | 0 refills | Status: AC | PRN

## 2017-01-29 MED ORDER — LIDOCAINE HCL (PF) 1 % IJ SOLN
INTRAMUSCULAR | Status: AC
  Filled 2017-01-29: qty 30

## 2017-01-29 MED ORDER — PHENYLEPHRINE HCL 10 MG/ML IJ SOLN
INTRAMUSCULAR | Status: DC | PRN
  Administered 2017-01-29: 100 ug via INTRAVENOUS

## 2017-01-29 MED ORDER — SODIUM CHLORIDE 0.9 % IV SOLN
INTRAVENOUS | Status: DC | PRN
  Administered 2017-01-29: 10:00:00

## 2017-01-29 MED ORDER — FENTANYL CITRATE (PF) 100 MCG/2ML IJ SOLN
INTRAMUSCULAR | Status: DC | PRN
  Administered 2017-01-29: 100 ug via INTRAVENOUS

## 2017-01-29 MED ORDER — PROPOFOL 10 MG/ML IV BOLUS
INTRAVENOUS | Status: DC | PRN
  Administered 2017-01-29: 100 mg via INTRAVENOUS

## 2017-01-29 MED ORDER — PROPOFOL 10 MG/ML IV BOLUS
INTRAVENOUS | Status: AC
  Filled 2017-01-29: qty 20

## 2017-01-29 MED ORDER — MIDODRINE HCL 5 MG PO TABS
10.0000 mg | ORAL_TABLET | Freq: Once | ORAL | Status: DC
  Filled 2017-01-29: qty 2

## 2017-01-29 MED ORDER — MEPERIDINE HCL 25 MG/ML IJ SOLN: 6.2500 mg | INTRAMUSCULAR | Status: DC | PRN

## 2017-01-29 MED ORDER — FENTANYL CITRATE (PF) 250 MCG/5ML IJ SOLN
INTRAMUSCULAR | Status: AC
  Filled 2017-01-29: qty 5

## 2017-01-29 MED ORDER — ALBUMIN HUMAN 5 % IV SOLN
INTRAVENOUS | Status: DC | PRN
  Administered 2017-01-29: 10:00:00 via INTRAVENOUS

## 2017-01-29 MED ORDER — 0.9 % SODIUM CHLORIDE (POUR BTL) OPTIME
TOPICAL | Status: DC | PRN
  Administered 2017-01-29: 1000 mL

## 2017-01-29 MED ORDER — PHENYLEPHRINE HCL 10 MG/ML IJ SOLN
INTRAMUSCULAR | Status: DC | PRN
  Administered 2017-01-29: 50 ug/min via INTRAVENOUS

## 2017-01-29 MED ORDER — LIDOCAINE HCL (CARDIAC) 20 MG/ML IV SOLN
INTRAVENOUS | Status: DC | PRN
  Administered 2017-01-29: 50 mg via INTRAVENOUS

## 2017-01-29 MED ORDER — SODIUM CHLORIDE 0.9 % IV SOLN
INTRAVENOUS | Status: DC
  Administered 2017-01-29: 08:00:00 via INTRAVENOUS

## 2017-01-29 MED ORDER — HYDROMORPHONE HCL 1 MG/ML IJ SOLN: 0.2500 mg | INTRAMUSCULAR | Status: DC | PRN

## 2017-01-29 MED ORDER — ONDANSETRON HCL 4 MG/2ML IJ SOLN: 4.0000 mg | Freq: Once | INTRAMUSCULAR | Status: DC | PRN

## 2017-01-29 MED ORDER — LIDOCAINE-EPINEPHRINE (PF) 1 %-1:200000 IJ SOLN
INTRAMUSCULAR | Status: AC
  Filled 2017-01-29: qty 30

## 2017-01-29 SURGICAL SUPPLY — 34 items
ADH SKN CLS APL DERMABOND .7 (GAUZE/BANDAGES/DRESSINGS) ×1
ARMBAND PINK RESTRICT EXTREMIT (MISCELLANEOUS) ×3 IMPLANT
CANISTER SUCT 3000ML PPV (MISCELLANEOUS) ×3 IMPLANT
CLIP VESOCCLUDE MED 6/CT (CLIP) ×3 IMPLANT
CLIP VESOCCLUDE SM WIDE 6/CT (CLIP) ×3 IMPLANT
COVER PROBE W GEL 5X96 (DRAPES) ×3 IMPLANT
DERMABOND ADVANCED (GAUZE/BANDAGES/DRESSINGS) ×2
DERMABOND ADVANCED .7 DNX12 (GAUZE/BANDAGES/DRESSINGS) ×1 IMPLANT
ELECT REM PT RETURN 9FT ADLT (ELECTROSURGICAL) ×3
ELECTRODE REM PT RTRN 9FT ADLT (ELECTROSURGICAL) ×1 IMPLANT
FELT TEFLON 1X6 (MISCELLANEOUS) ×2 IMPLANT
GLOVE BIO SURGEON STRL SZ7 (GLOVE) ×2 IMPLANT
GLOVE BIOGEL PI IND STRL 7.0 (GLOVE) IMPLANT
GLOVE BIOGEL PI IND STRL 7.5 (GLOVE) ×1 IMPLANT
GLOVE BIOGEL PI INDICATOR 7.0 (GLOVE) ×4
GLOVE BIOGEL PI INDICATOR 7.5 (GLOVE) ×4
GLOVE ECLIPSE 7.0 STRL STRAW (GLOVE) ×2 IMPLANT
GLOVE SURG SS PI 7.5 STRL IVOR (GLOVE) ×3 IMPLANT
GOWN STRL REUS W/ TWL LRG LVL3 (GOWN DISPOSABLE) ×2 IMPLANT
GOWN STRL REUS W/ TWL XL LVL3 (GOWN DISPOSABLE) ×1 IMPLANT
GOWN STRL REUS W/TWL LRG LVL3 (GOWN DISPOSABLE) ×6
GOWN STRL REUS W/TWL XL LVL3 (GOWN DISPOSABLE) ×3
HEMOSTAT SNOW SURGICEL 2X4 (HEMOSTASIS) IMPLANT
KIT BASIN OR (CUSTOM PROCEDURE TRAY) ×3 IMPLANT
KIT ROOM TURNOVER OR (KITS) ×3 IMPLANT
NS IRRIG 1000ML POUR BTL (IV SOLUTION) ×3 IMPLANT
PACK CV ACCESS (CUSTOM PROCEDURE TRAY) ×3 IMPLANT
PAD ARMBOARD 7.5X6 YLW CONV (MISCELLANEOUS) ×6 IMPLANT
SUT PROLENE 6 0 CC (SUTURE) ×17 IMPLANT
SUT VIC AB 3-0 SH 27 (SUTURE) ×12
SUT VIC AB 3-0 SH 27X BRD (SUTURE) ×1 IMPLANT
SUT VICRYL 4-0 PS2 18IN ABS (SUTURE) ×4 IMPLANT
UNDERPAD 30X30 (UNDERPADS AND DIAPERS) ×3 IMPLANT
WATER STERILE IRR 1000ML POUR (IV SOLUTION) ×3 IMPLANT

## 2017-01-29 NOTE — H&P (Signed)
   Patient name: Debbie [name] MRN: [id number] DOB: [birth year] Sex: female    HISTORY OF PRESENT ILLNESS:   Debbie [name] is a 83 y.o. female who is s/p right arm AVF creation.  Fistulogram was OK, but vein looked too deep on u/s  CURRENT MEDICATIONS:    Current Facility-Administered Medications  Medication Dose Route Frequency Provider Last Rate Last Dose  . 0.9 %  sodium chloride infusion   Intravenous Continuous Serafina Mitchell, MD      . cefUROXime (ZINACEF) 1.5 g in dextrose 5 % 50 mL IVPB  1.5 g Intravenous 30 min Pre-Op Serafina Mitchell, MD        REVIEW OF SYSTEMS:   [X]  denotes positive finding, [ ]  denotes negative finding Cardiac  Comments:  Chest pain or chest pressure:    Shortness of breath upon exertion:    Short of breath when lying flat:    Irregular heart rhythm:    Constitutional    Fever or chills:      PHYSICAL EXAM:   There were no vitals filed for this visit.  GENERAL: The patient is a well-nourished female, in no acute distress. The vital signs are documented above. CARDIOVASCULAR: There is a regular rate and rhythm. PULMONARY: Non-labored respirations   STUDIES:   none   MEDICAL ISSUES:   Plan for elevation and branch elevation  [name], MD [facility] [phone] Pager [phone]

## 2017-01-29 NOTE — Progress Notes (Signed)
Patient BP in 73/66K systolic taken in left arm, patient asymptomatic. Per patient she possibly has a "blockage" of some kind to left arm. BP checked in left leg 117/58, Dr. Conrad Diboll notified.

## 2017-01-29 NOTE — Progress Notes (Signed)
Sitting 85/64  Hr 70 / standing 81/65 hr 80,no dizziness when standing, Dr Conrad [place] notified and ok for d/c

## 2017-01-29 NOTE — Anesthesia Preprocedure Evaluation (Signed)
Anesthesia Evaluation  Patient identified by MRN, date of birth, ID band Patient awake    Reviewed: Allergy & Precautions, NPO status , Patient's Chart, lab work & pertinent test results  Airway Mallampati: I  TM Distance: >3 FB Neck ROM: Full    Dental   Pulmonary COPD,    Pulmonary exam normal        Cardiovascular hypertension, Pt. on medications Normal cardiovascular exam     Neuro/Psych Anxiety    GI/Hepatic   Endo/Other  diabetes, Type 2, Oral Hypoglycemic Agents  Renal/GU Renal InsufficiencyRenal disease     Musculoskeletal   Abdominal   Peds  Hematology   Anesthesia Other Findings   Reproductive/Obstetrics                             Anesthesia Physical Anesthesia Plan  ASA: III  Anesthesia Plan: General   Post-op Pain Management:    Induction: Intravenous  PONV Risk Score and Plan: 3 and Ondansetron, Dexamethasone, Midazolam and Treatment may vary due to age or medical condition  Airway Management Planned: LMA  Additional Equipment:   Intra-op Plan:   Post-operative Plan: Extubation in OR  Informed Consent: I have reviewed the patients History and Physical, chart, labs and discussed the procedure including the risks, benefits and alternatives for the proposed anesthesia with the patient or authorized representative who has indicated his/her understanding and acceptance.     Plan Discussed with: CRNA and Surgeon  Anesthesia Plan Comments:         Anesthesia Quick Evaluation

## 2017-01-29 NOTE — Transfer of Care (Signed)
Immediate Anesthesia Transfer of Care Note  Patient: MIRRA BASILIO  Procedure(s) Performed: Procedure(s): FISTULA SUPERFICIALIZATION RIGHT ARM WITH BRANCH LIGATION (Right)  Patient Location: PACU  Anesthesia Type:General  Level of Consciousness: awake and alert   Airway & Oxygen Therapy: Patient Spontanous Breathing and Patient connected to nasal cannula oxygen  Post-op Assessment: Report given to RN and Post -op Vital signs reviewed and stable  Post vital signs: Reviewed and stable  Last Vitals:  Vitals:   01/29/17 0750 01/29/17 1100  BP: (!) 117/58 (!) 64/40  Pulse:  72  Resp:  15  Temp:  (!) 36.4 C  SpO2:  94%    Last Pain:  Vitals:   01/29/17 0722  TempSrc: Oral      Patients Stated Pain Goal: 4 (45/80/99 8338)  Complications: No apparent anesthesia complications

## 2017-01-29 NOTE — Telephone Encounter (Signed)
-----   Message from Mena Goes, RN sent at 01/29/2017 11:01 AM EDT ----- Regarding: 4 weeks no lab   ----- Message ----- From: Ulyses Amor, PA-C Sent: 01/29/2017  10:50 AM To: Vvs Charge Pool  F/U in 4 weeks with Dr. Trula Slade s/p basilic transposition fistula no labs needed

## 2017-01-29 NOTE — Op Note (Signed)
    Patient name: Debbie [name] MRN: [id number] DOB: 1931-03-24 Sex: female  01/29/2017 Pre-operative Diagnosis: Non-maturing right arm fistula Post-operative diagnosis:  Same Surgeon:  [name] Assistants:  M.[name] Procedure:   Right arm fistula elevation with branch ligation Anesthesia:  Gen. Blood Loss:  See anesthesia record Specimens:  None  Findings:  Excellent 6 mm vein which was elevated.  There was one branch which was ligated  Indications:  The patient recently had a fistulogram for non-maturing right arm fistula.  This showed no abnormalities, however the vein was deep on evaluated with ultrasound.  She is here today for elevation.  Procedure:  The patient was identified in the holding area and taken to [place] [facility]  The patient was then placed supine on the table. general anesthesia was administered.  The patient was prepped and draped in the usual sterile fashion.  A time out was called and antibiotics were administered.  Ultrasound was used to map the course of the cephalic vein in the right upper arm.  2 longitudinal incisions were made directly anterior to the vein.  Cautery was used to dissect out and circumferentially mobilize the vein.  One large branch was identified which was ligated between 3-0 silk ties.  The vein was very thin and 2 venotomies were made which required closure with pledgeted 6-0 Prolene suture.  Once the vein was fully mobilized, I reapproximated the skin on the posterior side with interrupted 3-0 Vicryl.  The skin was closed directly anterior to the fistula with 4-0 Vicryl followed by Dermabond.  There were no immediate complications.   Disposition:  To PACU stable   V. [name], M.D. [facility] Office: [phone] Pager:  [phone]

## 2017-01-29 NOTE — Anesthesia Procedure Notes (Signed)
Procedure Name: LMA Insertion Date/Time: 01/29/2017 9:19 AM Performed by: Eligha Bridegroom Pre-anesthesia Checklist: Patient identified, Suction available, Emergency Drugs available, Patient being monitored and Timeout performed Patient Re-evaluated:Patient Re-evaluated prior to induction Oxygen Delivery Method: Circle system utilized Preoxygenation: Pre-oxygenation with 100% oxygen Induction Type: IV induction LMA: LMA inserted LMA Size: 4.0 Number of attempts: 1 Placement Confirmation: positive ETCO2 and breath sounds checked- equal and bilateral Tube secured with: Tape Dental Injury: Teeth and Oropharynx as per pre-operative assessment

## 2017-01-29 NOTE — Anesthesia Postprocedure Evaluation (Signed)
Anesthesia Post Note  Patient: Debbie [name]  Procedure(s) Performed: Procedure(s) (LRB): FISTULA SUPERFICIALIZATION RIGHT ARM WITH BRANCH LIGATION (Right)     Patient location during evaluation: PACU Anesthesia Type: General Level of consciousness: awake and alert Pain management: pain level controlled Vital Signs Assessment: post-procedure vital signs reviewed and stable Respiratory status: spontaneous breathing, nonlabored ventilation, respiratory function stable and patient connected to nasal cannula oxygen Cardiovascular status: blood pressure returned to baseline and stable Postop Assessment: no signs of nausea or vomiting Anesthetic complications: no    Last Vitals:  Vitals:   01/29/17 1300 01/29/17 1314  BP: (!) 84/56 (!) 86/52  Pulse:  63  Resp:    Temp:    SpO2: 96% 100%    Last Pain:  Vitals:   01/29/17 1210  TempSrc:   PainSc: 0-No pain                 Kinya Meine DAVID

## 2017-01-29 NOTE — Telephone Encounter (Signed)
Sched appt 03/02/17 at 1:30. Pt called back and resched to 03/09/17.

## 2017-01-30 ENCOUNTER — Encounter (HOSPITAL_COMMUNITY): Payer: Self-pay | Admitting: Surgery

## 2017-03-02 ENCOUNTER — Encounter: Payer: Medicare HMO | Admitting: Surgery

## 2017-03-09 ENCOUNTER — Encounter: Payer: Medicare HMO | Admitting: Surgery

## 2017-03-13 ENCOUNTER — Encounter: Payer: Self-pay | Admitting: Oncology

## 2017-03-26 ENCOUNTER — Ambulatory Visit: Admission: RE | Admit: 2017-03-26 | Payer: Medicare HMO | Source: Ambulatory Visit | Admitting: Radiation Oncology

## 2017-04-06 ENCOUNTER — Encounter: Payer: Medicare HMO | Admitting: Surgery

## 2017-04-14 ENCOUNTER — Telehealth: Payer: Self-pay | Admitting: *Deleted

## 2017-04-14 NOTE — Telephone Encounter (Signed)
Spoke to [place] at [facility] to sch appt 04/20/17 at 1:15.

## 2017-04-14 NOTE — Telephone Encounter (Signed)
Spoke to [place] at [facility] [place] that this patient needs an appt to evaluate her fistula asap. Had superficialization by Dr. Trula Slade in August. She has dialysis at [facility] on M,W,F. Please call Lennette Bihari at  [facility] to schedule this; [phone]. She may have recently had an appt that was cancelled in error for this. Patient also sees [place] for carotid issues but those appts need to stay in March 2019.

## 2017-04-15 ENCOUNTER — Encounter: Payer: Medicare HMO | Admitting: Family

## 2017-04-16 ENCOUNTER — Encounter: Payer: Medicare HMO | Admitting: Family

## 2017-04-20 ENCOUNTER — Encounter: Payer: Medicare HMO | Admitting: Family

## 2017-05-28 ENCOUNTER — Inpatient Hospital Stay (HOSPITAL_COMMUNITY)
Admission: EM | Admit: 2017-05-28 | Discharge: 2017-06-09 | DRG: 377 | Disposition: E | Payer: Medicare HMO | Attending: Internal Medicine | Admitting: Internal Medicine

## 2017-05-28 ENCOUNTER — Encounter (HOSPITAL_COMMUNITY): Payer: Self-pay | Admitting: Radiology

## 2017-05-28 ENCOUNTER — Emergency Department (HOSPITAL_COMMUNITY): Payer: Medicare HMO

## 2017-05-28 ENCOUNTER — Other Ambulatory Visit: Payer: Self-pay

## 2017-05-28 DIAGNOSIS — R188 Other ascites: Secondary | ICD-10-CM | POA: Diagnosis present

## 2017-05-28 DIAGNOSIS — I132 Hypertensive heart and chronic kidney disease with heart failure and with stage 5 chronic kidney disease, or end stage renal disease: Secondary | ICD-10-CM | POA: Diagnosis present

## 2017-05-28 DIAGNOSIS — I480 Paroxysmal atrial fibrillation: Secondary | ICD-10-CM | POA: Diagnosis present

## 2017-05-28 DIAGNOSIS — R0902 Hypoxemia: Secondary | ICD-10-CM | POA: Diagnosis present

## 2017-05-28 DIAGNOSIS — Z9981 Dependence on supplemental oxygen: Secondary | ICD-10-CM

## 2017-05-28 DIAGNOSIS — Z66 Do not resuscitate: Secondary | ICD-10-CM | POA: Diagnosis present

## 2017-05-28 DIAGNOSIS — D631 Anemia in chronic kidney disease: Secondary | ICD-10-CM | POA: Diagnosis not present

## 2017-05-28 DIAGNOSIS — J449 Chronic obstructive pulmonary disease, unspecified: Secondary | ICD-10-CM | POA: Diagnosis present

## 2017-05-28 DIAGNOSIS — I5030 Unspecified diastolic (congestive) heart failure: Secondary | ICD-10-CM | POA: Diagnosis present

## 2017-05-28 DIAGNOSIS — K439 Ventral hernia without obstruction or gangrene: Secondary | ICD-10-CM | POA: Diagnosis present

## 2017-05-28 DIAGNOSIS — Z992 Dependence on renal dialysis: Secondary | ICD-10-CM

## 2017-05-28 DIAGNOSIS — E785 Hyperlipidemia, unspecified: Secondary | ICD-10-CM | POA: Diagnosis present

## 2017-05-28 DIAGNOSIS — K625 Hemorrhage of anus and rectum: Secondary | ICD-10-CM | POA: Diagnosis not present

## 2017-05-28 DIAGNOSIS — J90 Pleural effusion, not elsewhere classified: Secondary | ICD-10-CM | POA: Diagnosis not present

## 2017-05-28 DIAGNOSIS — K56600 Partial intestinal obstruction, unspecified as to cause: Secondary | ICD-10-CM | POA: Diagnosis present

## 2017-05-28 DIAGNOSIS — I2510 Atherosclerotic heart disease of native coronary artery without angina pectoris: Secondary | ICD-10-CM | POA: Diagnosis present

## 2017-05-28 DIAGNOSIS — E1122 Type 2 diabetes mellitus with diabetic chronic kidney disease: Secondary | ICD-10-CM | POA: Diagnosis present

## 2017-05-28 DIAGNOSIS — D638 Anemia in other chronic diseases classified elsewhere: Secondary | ICD-10-CM | POA: Diagnosis present

## 2017-05-28 DIAGNOSIS — K8020 Calculus of gallbladder without cholecystitis without obstruction: Secondary | ICD-10-CM | POA: Diagnosis present

## 2017-05-28 DIAGNOSIS — I953 Hypotension of hemodialysis: Secondary | ICD-10-CM | POA: Diagnosis not present

## 2017-05-28 DIAGNOSIS — Z923 Personal history of irradiation: Secondary | ICD-10-CM

## 2017-05-28 DIAGNOSIS — K432 Incisional hernia without obstruction or gangrene: Secondary | ICD-10-CM | POA: Diagnosis present

## 2017-05-28 DIAGNOSIS — I4891 Unspecified atrial fibrillation: Secondary | ICD-10-CM | POA: Diagnosis present

## 2017-05-28 DIAGNOSIS — Z79899 Other long term (current) drug therapy: Secondary | ICD-10-CM

## 2017-05-28 DIAGNOSIS — E1151 Type 2 diabetes mellitus with diabetic peripheral angiopathy without gangrene: Secondary | ICD-10-CM | POA: Diagnosis present

## 2017-05-28 DIAGNOSIS — I6529 Occlusion and stenosis of unspecified carotid artery: Secondary | ICD-10-CM | POA: Diagnosis present

## 2017-05-28 DIAGNOSIS — E8889 Other specified metabolic disorders: Secondary | ICD-10-CM | POA: Diagnosis present

## 2017-05-28 DIAGNOSIS — N321 Vesicointestinal fistula: Secondary | ICD-10-CM | POA: Diagnosis present

## 2017-05-28 DIAGNOSIS — Z885 Allergy status to narcotic agent status: Secondary | ICD-10-CM

## 2017-05-28 DIAGNOSIS — K5731 Diverticulosis of large intestine without perforation or abscess with bleeding: Secondary | ICD-10-CM | POA: Diagnosis present

## 2017-05-28 DIAGNOSIS — N2581 Secondary hyperparathyroidism of renal origin: Secondary | ICD-10-CM | POA: Diagnosis present

## 2017-05-28 DIAGNOSIS — I10 Essential (primary) hypertension: Secondary | ICD-10-CM | POA: Diagnosis not present

## 2017-05-28 DIAGNOSIS — I708 Atherosclerosis of other arteries: Secondary | ICD-10-CM | POA: Diagnosis present

## 2017-05-28 DIAGNOSIS — I5033 Acute on chronic diastolic (congestive) heart failure: Secondary | ICD-10-CM | POA: Diagnosis present

## 2017-05-28 DIAGNOSIS — Z8542 Personal history of malignant neoplasm of other parts of uterus: Secondary | ICD-10-CM | POA: Diagnosis not present

## 2017-05-28 DIAGNOSIS — N186 End stage renal disease: Secondary | ICD-10-CM | POA: Diagnosis not present

## 2017-05-28 DIAGNOSIS — I959 Hypotension, unspecified: Secondary | ICD-10-CM | POA: Diagnosis present

## 2017-05-28 DIAGNOSIS — F0390 Unspecified dementia without behavioral disturbance: Secondary | ICD-10-CM | POA: Diagnosis present

## 2017-05-28 DIAGNOSIS — L988 Other specified disorders of the skin and subcutaneous tissue: Secondary | ICD-10-CM | POA: Diagnosis not present

## 2017-05-28 DIAGNOSIS — E118 Type 2 diabetes mellitus with unspecified complications: Secondary | ICD-10-CM | POA: Diagnosis present

## 2017-05-28 DIAGNOSIS — I700 Atherosclerosis of aorta: Secondary | ICD-10-CM | POA: Diagnosis present

## 2017-05-28 DIAGNOSIS — R1084 Generalized abdominal pain: Secondary | ICD-10-CM

## 2017-05-28 DIAGNOSIS — R112 Nausea with vomiting, unspecified: Secondary | ICD-10-CM | POA: Diagnosis not present

## 2017-05-28 DIAGNOSIS — F419 Anxiety disorder, unspecified: Secondary | ICD-10-CM | POA: Diagnosis present

## 2017-05-28 DIAGNOSIS — K551 Chronic vascular disorders of intestine: Secondary | ICD-10-CM | POA: Diagnosis present

## 2017-05-28 DIAGNOSIS — Z0189 Encounter for other specified special examinations: Secondary | ICD-10-CM

## 2017-05-28 LAB — COMPREHENSIVE METABOLIC PANEL
ALBUMIN: 2.8 g/dL — AB (ref 3.5–5.0)
ALT: 18 U/L (ref 14–54)
ANION GAP: 13 (ref 5–15)
AST: 28 U/L (ref 15–41)
Alkaline Phosphatase: 118 U/L (ref 38–126)
BILIRUBIN TOTAL: 0.7 mg/dL (ref 0.3–1.2)
BUN: 35 mg/dL — ABNORMAL HIGH (ref 6–20)
CHLORIDE: 94 mmol/L — AB (ref 101–111)
CO2: 30 mmol/L (ref 22–32)
Calcium: 8.6 mg/dL — ABNORMAL LOW (ref 8.9–10.3)
Creatinine, Ser: 3.56 mg/dL — ABNORMAL HIGH (ref 0.44–1.00)
GFR calc Af Amer: 13 mL/min — ABNORMAL LOW (ref >60)
GFR, EST NON AFRICAN AMERICAN: 11 mL/min — AB (ref >60)
Glucose, Bld: 148 mg/dL — ABNORMAL HIGH (ref 65–99)
POTASSIUM: 3.3 mmol/L — AB (ref 3.5–5.1)
Sodium: 137 mmol/L (ref 135–145)
TOTAL PROTEIN: 6.8 g/dL (ref 6.5–8.1)

## 2017-05-28 LAB — CBC
HEMATOCRIT: 38.2 % (ref 36.0–46.0)
HEMOGLOBIN: 11.7 g/dL — AB (ref 12.0–15.0)
MCH: 28.5 pg (ref 26.0–34.0)
MCHC: 30.6 g/dL (ref 30.0–36.0)
MCV: 92.9 fL (ref 78.0–100.0)
Platelets: 227 10*3/uL (ref 150–400)
RBC: 4.11 MIL/uL (ref 3.87–5.11)
RDW: 19.4 % — ABNORMAL HIGH (ref 11.5–15.5)
WBC: 13.2 10*3/uL — ABNORMAL HIGH (ref 4.0–10.5)

## 2017-05-28 LAB — POC OCCULT BLOOD, ED: Fecal Occult Bld: NEGATIVE

## 2017-05-28 MED ORDER — HEPARIN SODIUM (PORCINE) 5000 UNIT/ML IJ SOLN
5000.0000 [IU] | Freq: Three times a day (TID) | INTRAMUSCULAR | Status: DC
Start: 2017-05-29 — End: 2017-05-29

## 2017-05-28 MED ORDER — HYDROCODONE-ACETAMINOPHEN 5-325 MG PO TABS
1.0000 | ORAL_TABLET | ORAL | Status: DC | PRN
  Administered 2017-05-29: 2 via ORAL
  Filled 2017-05-28: qty 2

## 2017-05-28 MED ORDER — ACETAMINOPHEN 650 MG RE SUPP: 650.0000 mg | Freq: Four times a day (QID) | RECTAL | Status: DC | PRN

## 2017-05-28 MED ORDER — ONDANSETRON HCL 4 MG/2ML IJ SOLN
4.0000 mg | Freq: Four times a day (QID) | INTRAMUSCULAR | Status: DC | PRN
  Administered 2017-05-29 – 2017-05-30 (×5): 4 mg via INTRAVENOUS
  Filled 2017-05-28 (×5): qty 2

## 2017-05-28 MED ORDER — ACETAMINOPHEN 325 MG PO TABS: 650.0000 mg | ORAL_TABLET | Freq: Four times a day (QID) | ORAL | Status: DC | PRN

## 2017-05-28 MED ORDER — IOPAMIDOL (ISOVUE-300) INJECTION 61%
INTRAVENOUS | Status: AC
  Filled 2017-05-28: qty 30

## 2017-05-28 MED ORDER — BISACODYL 5 MG PO TBEC: 5.0000 mg | DELAYED_RELEASE_TABLET | Freq: Every day | ORAL | Status: DC | PRN

## 2017-05-28 MED ORDER — SENNOSIDES-DOCUSATE SODIUM 8.6-50 MG PO TABS: 1.0000 | ORAL_TABLET | Freq: Every evening | ORAL | Status: DC | PRN

## 2017-05-28 MED ORDER — ONDANSETRON HCL 4 MG PO TABS: 4.0000 mg | ORAL_TABLET | Freq: Four times a day (QID) | ORAL | Status: DC | PRN

## 2017-05-28 NOTE — ED Notes (Signed)
Bed: NL89 Expected date:  Expected time:  Means of arrival:  Comments: EMS-rectal bleeding

## 2017-05-28 NOTE — ED Provider Notes (Signed)
Medical screening examination/treatment/procedure(s) were conducted as a shared visit with non-physician practitioner(s) and myself.  I personally evaluated the patient during the encounter.   EKG Interpretation None     83 year old female presents with blood in stool x1 today.  Denies any severe abdominal pain.  No blood currently on exam.  Does have history of diverticular disease.  Has a mild leukocytosis and will get abdominal CT.   Debbie Leigh, MD 05/29/2017 2046

## 2017-05-28 NOTE — ED Notes (Signed)
Able to complete oral contrast with much assistance.

## 2017-05-28 NOTE — ED Notes (Signed)
Admitting MD in to speak with pt, pt will go to [facility] for admission due to dialysis ( scheduled tomorrow)

## 2017-05-28 NOTE — ED Notes (Signed)
Pt encouraged to drink contrast for CT, son sitting in room stating to staff you will have to make her drink it. Encouraged son to remind pt to drink.

## 2017-05-28 NOTE — ED Provider Notes (Signed)
[facility] DEPT Provider Note   CSN: [id number] Arrival date & time: 05/23/2017  1704     History   Chief Complaint Chief Complaint  Patient presents with  . Rectal Bleeding    HPI Debbie [name] is a 83 y.o. female.  HPI 83 year old Caucasian female past medical history significant for CHF, CKD on hemodialysis, COPD, diabetes, hypertension presents to the emergency department today from nursing facility for evaluation of blood in stool.  Patient is coming from [facility] with complaint of blood in her stools.  Patient denies any complaints at this time.  Patient denies any abdominal pain, urinary symptoms, vaginal symptoms, change in bowel habits, chest pain or shortness of breath, dizziness, weakness.  Patient is a poor historian at baseline.   Pt denies any fever, chill, ha, vision changes, lightheadedness, dizziness, congestion, neck pain, cp, sob, cough, abd pain, n/v/d, urinary symptoms, change in bowel habits, lower extremity paresthesias.  Past Medical History:  Diagnosis Date  . Allergy   . Anxiety   . Arthritis   . Cancer ([place]) dx'd 12/2007   endometroid adenocarcinoma  . Cataract    both  . CHF (congestive heart failure) ([place])   . Chronic kidney disease   . COPD (chronic obstructive pulmonary disease) ([place])   . Dementia   . Diabetes mellitus    Type II  . Dizziness   . Dyspnea   . Dysrhythmia    Paroxysmal Afib  . History of radiation therapy 9/21,9/28,10/05,05/20/2008   4 txs 2400 cGy endometrial adenocarcinoma  . History of radiation therapy 02/12/16-03/19/16,  04/09/16, 04/15/16, 04/22/16   Pelvis/ 50 Gy in 25 fractions and HDR vagina / 18 Gy in 3 fractions  . Hypertension   . Hypotension   . Peripheral vascular disease ([place])   . Pneumonia   . Rectal bleeding 01/2017  . Seasonal allergies   . Secondary malignant neoplasm of vagina ([place])   . UTI (urinary tract infection) 05/2016    Patient Active  Problem List   Diagnosis Date Noted  . Diverticulosis of colon with hemorrhage   . Hematochezia 01/14/2017  . Pleural effusion 11/15/2016  . Hypotension 11/13/2016  . Bright red blood per rectum 11/13/2016  . Central venous catheter in place   . AKI (acute kidney injury) ([place])   . Hyperkalemia   . Atrial fibrillation ([place])   . Bradycardia   . Renal failure 06/25/2016  . COPD exacerbation ([place]) 06/08/2016  . Diastolic heart failure ([place]) 06/08/2016  . Acute respiratory failure with hypoxia ([place])   . COPD with hypoxia ([place]) 06/02/2016  . Pressure injury of skin 05/29/2016  . Respiratory distress 05/29/2016  . UTI (urinary tract infection) 05/28/2016  . Hypoglycemia 05/28/2016  . Diabetes ([place]) 05/28/2016  . Anxiety 05/28/2016  . Hypertension 05/28/2016  . Diabetes mellitus with complication ([place])   . Renal insufficiency   . Urinary tract infection without hematuria   . Secondary malignant neoplasm of vagina ([place]) 01/30/2016  . Carotid stenosis, asymptomatic 11/28/2014  . Aftercare following surgery of the circulatory system 04/11/2014  . Aftercare following surgery of the circulatory system, [place] 01/31/2014  . Carotid stenosis 01/10/2014  . Occlusion and stenosis of carotid artery without mention of cerebral infarction 01/10/2014  . Malignant neoplasm of corpus uteri, except isthmus ([place]) 07/07/2011    Past Surgical History:  Procedure Laterality Date  . A/V FISTULAGRAM Right 01/13/2017   Procedure: A/V Fistulagram - Right Arm;  Surgeon: Serafina Mitchell, MD;  Location: [facility];  Service: Cardiovascular;  Laterality: Right;  . ABDOMINAL HYSTERECTOMY    . APPENDECTOMY    . AV FISTULA PLACEMENT Left 07/17/2016   Procedure: LEFT ARM ARTERIOVENOUS (AV) FISTULA CREATION;  Surgeon: Angelia Mould, MD;  Location: [facility];  Service: Vascular;  Laterality: Left;  . ENDARTERECTOMY Left 01/12/2014   Procedure: LEFT CAROTID ARTERY ENDARTERECTOMY WITH HEMASHIELD PATCH  ANGIOPLASTY;  Surgeon: Mal Misty, MD;  Location: [facility];  Service: Vascular;  Laterality: Left;  . ENDARTERECTOMY Right 03/17/2014   Procedure: RIGHT CAROTD ARTERY ENDARTERECTOMY WITH DACRON PATCH ANGIOPLASTY;  Surgeon: Mal Misty, MD;  Location: [facility];  Service: Vascular;  Laterality: Right;  . EYE SURGERY Bilateral    cataracts  . FISTULA SUPERFICIALIZATION Right 01/29/2017   Procedure: FISTULA SUPERFICIALIZATION RIGHT ARM WITH BRANCH LIGATION;  Surgeon: Serafina Mitchell, MD;  Location: [facility];  Service: Vascular;  Laterality: Right;  . FLEXIBLE SIGMOIDOSCOPY N/A 01/15/2017   Procedure: FLEXIBLE SIGMOIDOSCOPY;  Surgeon: Gatha Mayer, MD;  Location: [facility] ENDOSCOPY;  Service: Endoscopy;  Laterality: N/A;  . INSERTION OF DIALYSIS CATHETER Right 07/17/2016   Procedure: INSERTION OF DIALYSIS CATHETER;  Surgeon: Angelia Mould, MD;  Location: [facility];  Service: Vascular;  Laterality: Right;  . NASAL SEPTUM SURGERY    . NECK SURGERY Bilateral    arterial  . TONSILLECTOMY      OB History    No data available       Home Medications    Prior to Admission medications   Medication Sig Start Date End Date Taking? Authorizing Provider  Albuterol Sulfate (PROAIR RESPICLICK) 599 (90 Base) MCG/ACT AEPB Inhale 1-2 puffs into the lungs every 4 (four) hours as needed. Patient taking differently: Inhale 1-2 puffs into the lungs every 4 (four) hours as needed (for shortness of breath).  06/02/16  Yes Patrecia Pour, Christean Grief, MD  Amino Acids-Protein Hydrolys (FEEDING SUPPLEMENT, PRO-STAT SUGAR FREE 64,) LIQD Take 30 mLs by mouth 2 (two) times daily. 11/15/16  Yes Elgergawy, Silver Huguenin, MD  atorvastatin (LIPITOR) 20 MG tablet Take 20 mg by mouth every evening. (1700) 05/26/16  Yes [provider]  B Complex-C-Folic Acid (NEPHRO-VITE PO) Take 1 tablet by mouth daily.    Yes [provider]  donepezil (ARICEPT) 5 MG tablet Take 5 mg by mouth at bedtime.    Yes [provider]    guaiFENesin (MUCINEX) 600 MG 12 hr tablet Take 1 tablet (600 mg total) by mouth 2 (two) times daily as needed. Patient taking differently: Take 600 mg by mouth 2 (two) times daily as needed for cough (congestion).  07/19/16  Yes Bonnielee Haff, MD  midodrine (PROAMATINE) 10 MG tablet Take 1 tablet (10 mg total) by mouth 2 (two) times daily with a meal. 11/15/16  Yes Elgergawy, Silver Huguenin, MD  oxyCODONE-acetaminophen (PERCOCET/ROXICET) 5-325 MG tablet Take 0.5-1 tablets by mouth every 6 (six) hours as needed. 01/29/17  Yes [name], [name] M, PA-C  PROCTO-MED HC 2.5 % rectal cream Place 1 application rectally 2 (two) times daily. 05/26/17  Yes [provider]  senna-docusate (SENOKOT-S) 8.6-50 MG tablet Take 2 tablets by mouth 2 (two) times daily. 11/15/16 11/15/17 Yes Elgergawy, Silver Huguenin, MD    Family History Family History  Problem Relation Age of Onset  . Actinic keratosis Mother   . Cancer Mother        uterine  . Heart disease Father   . Hyperlipidemia Father   . Hypertension Father   .  Stroke Father   . Cancer Sister        lung    Social History Social History   Tobacco Use  . Smoking status: Never Smoker  . Smokeless tobacco: Never Used  Substance Use Topics  . Alcohol use: No  . Drug use: No     Allergies   Codeine   Review of Systems Review of Systems  Reason unable to perform ROS: difficult historian.  Constitutional: Negative for chills and fever.  HENT: Negative for congestion.   Eyes: Negative for visual disturbance.  Respiratory: Negative for cough and shortness of breath.   Cardiovascular: Negative for chest pain.  Gastrointestinal: Positive for blood in stool (per nursing staff). Negative for abdominal pain, diarrhea, nausea and vomiting.  Genitourinary: Negative for dysuria, flank pain, frequency, hematuria and urgency.  Musculoskeletal: Negative for arthralgias and myalgias.  Skin: Negative for rash.  Neurological: Negative for dizziness, syncope,  weakness, light-headedness, numbness and headaches.  Psychiatric/Behavioral: Negative for sleep disturbance. The patient is not nervous/anxious.      Physical Exam Updated Vital Signs BP (!) 127/52 (BP Location: Left Leg)   Pulse 68   Temp 97.8 F (36.6 C) (Oral)   Resp 18   Ht 5\' 2"  (1.575 m)   Wt 68.9 kg (152 lb)   SpO2 96%   BMI 27.80 kg/m   Physical Exam  Constitutional: She is oriented to person, place, and time. She appears well-developed and well-nourished.  Non-toxic appearance. No distress.  HENT:  Head: Normocephalic and atraumatic.  Nose: Nose normal.  Mouth/Throat: Oropharynx is clear and moist.  Eyes: Conjunctivae are normal. Pupils are equal, round, and reactive to light. Right eye exhibits no discharge. Left eye exhibits no discharge.  Neck: Normal range of motion. Neck supple.  Cardiovascular: Normal rate, regular rhythm, normal heart sounds and intact distal pulses. Exam reveals no gallop and no friction rub.  No murmur heard. Pulmonary/Chest: Effort normal and breath sounds normal. No stridor. No respiratory distress. She has no wheezes. She has no rales. She exhibits no tenderness.  Abdominal: Soft. She exhibits distension (mild). Bowel sounds are increased. There is generalized tenderness. There is no rigidity, no rebound, no guarding, no CVA tenderness, no tenderness at McBurney's point and negative Murphy's sign.  Genitourinary:  Genitourinary Comments: Chaperone present for exam. Pt tolerated without difficulty. No external hemorrhoids or fissures noted. No pain with palpation of the rectal vault. No internal hemorrhoids noted. Soft brown stool noted in the rectal vault. No gross hematochezia or melena. Hemoccult negative.   Musculoskeletal: Normal range of motion. She exhibits no tenderness.  Lymphadenopathy:    She has no cervical adenopathy.  Neurological: She is alert and oriented to person, place, and time.  Skin: Skin is warm and dry. Capillary refill  takes less than 2 seconds.  Psychiatric: Her behavior is normal. Judgment and thought content normal.  Nursing note and vitals reviewed.    ED Treatments / Results  Labs (all labs ordered are listed, but only abnormal results are displayed) Labs Reviewed  COMPREHENSIVE METABOLIC PANEL - Abnormal; Notable for the following components:      Result Value   Potassium 3.3 (*)    Chloride 94 (*)    Glucose, Bld 148 (*)    BUN 35 (*)    Creatinine, Ser 3.56 (*)    Calcium 8.6 (*)    Albumin 2.8 (*)    GFR calc non Af Amer 11 (*)    GFR calc Af Amer 13 (*)  All other components within normal limits  CBC - Abnormal; Notable for the following components:   WBC 13.2 (*)    Hemoglobin 11.7 (*)    RDW 19.4 (*)    All other components within normal limits  URINALYSIS, ROUTINE W REFLEX MICROSCOPIC  POC OCCULT BLOOD, ED  POC OCCULT BLOOD, ED    EKG  EKG Interpretation None       Radiology Ct Abdomen Pelvis Wo Contrast  Result Date: 05/12/2017 CLINICAL DATA:  Intermittent blood in stool. History of endometrial carcinoma. Chronic renal failure EXAM: CT ABDOMEN AND PELVIS WITHOUT CONTRAST TECHNIQUE: Multidetector CT imaging of the abdomen and pelvis was performed following the standard protocol without IV contrast. Oral contrast was administered. COMPARISON:  November 13, 2016 FINDINGS: Lower chest: There is a sizable right pleural effusion with consolidation in the right lower lobe. There is a minimal left pleural effusion. There are multiple foci of coronary artery calcification. Hepatobiliary: No focal liver lesions are appreciable on this noncontrast enhanced study. There are gallstones within the gallbladder. The gallbladder wall does thickened. There is no biliary duct dilatation. Pancreas: There is no pancreatic mass or inflammatory focus. Spleen: No splenic lesions are evident. Adrenals/Urinary Tract: Adrenals bilaterally appear unremarkable. There is moderate perinephric stranding and  fluid surrounding both kidneys. There is a 1.2 x 1.0 cm cyst arising from the anterior mid left kidney. There is no appreciable hydronephrosis on either side. There is no renal or ureteral calculus on either side. There is diffuse urinary bladder wall thickening with extensive air in the bladder. Note that a fat plane between the sigmoid colon in the urinary bladder cannot be ascertained. This appearance raises concern for enterovesical fistula. Stomach/Bowel: There is wall thickening throughout the sigmoid colon. There are multiple diverticula without frank diverticulitis. Note that the sigmoid colon cannot be separated from the urinary bladder, raising concern for potential enterovesical fistula. There is a sizable ventral hernia. Loops of small bowel extends into this hernia without bowel compromise. A small amount of ascites is present in this area. Elsewhere, there is no appreciable bowel wall or mesenteric thickening. There are diverticula in the descending colon without diverticulitis. No evident bowel obstruction. No free air or portal venous air. Vascular/Lymphatic: There is extensive aortoiliac atherosclerosis. There appears to be hemodynamically significant obstruction in the proximal iliac arteries bilaterally, more severe on the right than on the left. There is calcification in multiple pelvic arterial vessels. There is also calcification in the mesenteric vessels. There appears to be high-grade stenosis in all the proximal major mesenteric vessels without frank obstruction. There is extensive calcification in the hepatic artery extending into the proximal liver. No adenopathy is appreciable in the abdomen or pelvis. Reproductive: Uterus absent.  No well-defined pelvic mass evident. Other: Appendix absent. There is mild ascites in the abdomen, primarily adjacent to the liver and within the ventral hernia repair no abscess is seen in the abdomen or pelvis. There is a degree of abdominal wall anasarca.  Musculoskeletal: There is extensive degenerative change in the lumbar spine. There are no blastic or lytic bone lesions. No intramuscular lesion evident. IMPRESSION: 1. A fat plane cannot be established between the sigmoid colon and the urinary bladder. There is air in the urinary bladder. Question enterovesical fistula. 2. Sizable ventral hernia containing loops of small bowel without bowel compromise. No bowel obstruction evident. 3. Left-sided colonic diverticulosis. Wall thickening in the sigmoid colon is likely due to muscular hypertrophy from chronic diverticulosis. No frank diverticulitis appreciable. 4.  Sizable right pleural effusion with right base consolidation. Minimal left pleural effusion. 5.  There is a degree of anasarca in the abdominal wall. 6. Extensive aortic atherosclerosis. There is hemodynamically significant obstruction due to calcification in both common iliac arteries. There is extensive mesenteric arterial atherosclerosis with what appears to be high-grade stenosis in the major mesenteric arteries. There is no frank bowel ischemia evident on this study. There is also extensive coronary artery calcification. 7.  Cholelithiasis. 8.  Mild ascites. 9.  Uterus absent.  Appendix absent. Aortic Atherosclerosis (ICD10-I70.0). Electronically Signed   By: Lowella Grip III M.D.   On: 05/14/2017 22:35    Procedures Procedures (including critical care time)  Medications Ordered in ED Medications  iopamidol (ISOVUE-300) 61 % injection (not administered)     Initial Impression / Assessment and Plan / ED Course  I have reviewed the triage vital signs and the nursing notes.  Pertinent labs & imaging results that were available during my care of the patient were reviewed by me and considered in my medical decision making (see chart for details).     The patient presents to the ED from nursing facility after they reported blood in her stool today.  Patient denies any specific complaint  including abdominal pain, nausea, emesis, fever, urinary symptoms.  Patient is on hemodialysis every Monday Wednesdays and Fridays.  She is not on any blood thinners.  Unsure last colonoscopy.  Patient's vital signs are reassuring.  She is afebrile, no tachycardia, no hypotension noted.  Patient has some mild distention of the abdomen.  Generalized tenderness to palpation without any signs of peritonitis.  Lungs clear to auscultation bilaterally.  Regular rate and rhythm with no rubs murmurs or gallops.  Lab reveals a leukocytosis of 13,000.  Potassium is 3.3.  Creatinine appears the patient's baseline.  Patient's occult blood was negative.  I was called into the room by nursing staff to the tried an outpatient to get urine sample.  Patient had stool coming from her urethra during and out cath.  Suspect fistula.  CT scan was ordered to evaluate intra-abdominal pathology.  Ct reveals:  IMPRESSION: 1. A fat plane cannot be established between the sigmoid colon and the urinary bladder. There is air in the urinary bladder. Question enterovesical fistula. 2. Sizable ventral hernia containing loops of small bowel without bowel compromise. No bowel obstruction evident. 3. Left-sided colonic diverticulosis. Wall thickening in the sigmoid colon is likely due to muscular hypertrophy from chronic diverticulosis. No frank diverticulitis appreciable. 4. Sizable right pleural effusion with right base consolidation. Minimal left pleural effusion. 5.  There is a degree of anasarca in the abdominal wall. 6. Extensive aortic atherosclerosis. There is hemodynamically significant obstruction due to calcification in both common iliac arteries. There is extensive mesenteric arterial atherosclerosis with what appears to be high-grade stenosis in the major mesenteric arteries. There is no frank bowel ischemia evident on this study. There is also extensive coronary artery calcification. 7.  Cholelithiasis. 8.  Mild ascites. 9.   Uterus absent.  Appendix absent. Aortic Atherosclerosis  Patient was discussed with our general surgeon Dr. Barry Dienes with general surgery who recommends hospital admission and will be consulted by general surgery in the a.m. with likely urology to determine this patient's surgical intervention.  The patient was discussed with Dr. Reesa Chew with hospital medicine who agrees to admission and will see patient in the ED and place admission orders.  Given the patient is dialysis patient she will need to transferred to [facility].  She remains hemodynamically stable at this time.  Abdominal exam is benign without any focal tenderness or signs of peritonitis.  Pt seen and eval by my attending who is agreeable with the above plan.   Final Clinical Impressions(s) / ED Diagnoses   Final diagnoses:  Generalized abdominal pain  Fistula    ED Discharge Orders    None       Aaron Edelman 05/10/2017 2338    Doristine Devoid, PA-C 05/29/17 0020

## 2017-05-28 NOTE — ED Triage Notes (Addendum)
Pt arrived from [facility] with c/o of blood in her stools. Pt reports no Pain, no dizziness, or weakness. Pt does report N but no vomiting. Pt has hx of GI issues and is Diabetic. Pt gets dialysis MWF, Pt also has history of Dementia.

## 2017-05-28 NOTE — ED Notes (Signed)
Cleaned stool off pt and I & O to obtain urine. Concentrated urine/stool in cath. Due to sample, 2nd cath attempted with same content. Tyler PA notified to come to room to visualize contents.

## 2017-05-29 ENCOUNTER — Other Ambulatory Visit: Payer: Self-pay

## 2017-05-29 DIAGNOSIS — N321 Vesicointestinal fistula: Secondary | ICD-10-CM

## 2017-05-29 DIAGNOSIS — R1084 Generalized abdominal pain: Secondary | ICD-10-CM

## 2017-05-29 DIAGNOSIS — D631 Anemia in chronic kidney disease: Secondary | ICD-10-CM

## 2017-05-29 DIAGNOSIS — N186 End stage renal disease: Secondary | ICD-10-CM

## 2017-05-29 DIAGNOSIS — K625 Hemorrhage of anus and rectum: Secondary | ICD-10-CM

## 2017-05-29 DIAGNOSIS — I10 Essential (primary) hypertension: Secondary | ICD-10-CM

## 2017-05-29 LAB — CBC
HEMATOCRIT: 35.7 % — AB (ref 36.0–46.0)
HEMOGLOBIN: 11 g/dL — AB (ref 12.0–15.0)
MCH: 28.2 pg (ref 26.0–34.0)
MCHC: 30.8 g/dL (ref 30.0–36.0)
MCV: 91.5 fL (ref 78.0–100.0)
Platelets: 248 10*3/uL (ref 150–400)
RBC: 3.9 MIL/uL (ref 3.87–5.11)
RDW: 19.8 % — AB (ref 11.5–15.5)
WBC: 9.9 10*3/uL (ref 4.0–10.5)

## 2017-05-29 LAB — GLUCOSE, CAPILLARY: Glucose-Capillary: 87 mg/dL (ref 65–99)

## 2017-05-29 LAB — URINALYSIS, MICROSCOPIC (REFLEX)

## 2017-05-29 LAB — URINALYSIS, ROUTINE W REFLEX MICROSCOPIC
Bilirubin Urine: NEGATIVE
Glucose, UA: 50 mg/dL — AB
Ketones, ur: 20 mg/dL — AB
Nitrite: NEGATIVE
Protein, ur: 100 mg/dL — AB
SPECIFIC GRAVITY, URINE: 1.029 (ref 1.005–1.030)
pH: 7 (ref 5.0–8.0)

## 2017-05-29 LAB — MRSA PCR SCREENING: MRSA by PCR: NEGATIVE

## 2017-05-29 MED ORDER — PIPERACILLIN-TAZOBACTAM 3.375 G IVPB
3.3750 g | Freq: Two times a day (BID) | INTRAVENOUS | Status: DC
  Administered 2017-05-29 – 2017-05-31 (×6): 3.375 g via INTRAVENOUS
  Filled 2017-05-29 (×7): qty 50

## 2017-05-29 MED ORDER — ATORVASTATIN CALCIUM 20 MG PO TABS
20.0000 mg | ORAL_TABLET | Freq: Every evening | ORAL | Status: DC
  Administered 2017-05-29 – 2017-05-30 (×2): 20 mg via ORAL
  Filled 2017-05-29 (×2): qty 1

## 2017-05-29 MED ORDER — MIDODRINE HCL 5 MG PO TABS
10.0000 mg | ORAL_TABLET | Freq: Two times a day (BID) | ORAL | Status: DC
  Administered 2017-05-29 – 2017-05-31 (×6): 10 mg via ORAL
  Filled 2017-05-29 (×6): qty 2

## 2017-05-29 MED ORDER — OXYCODONE-ACETAMINOPHEN 5-325 MG PO TABS: 0.5000 | ORAL_TABLET | Freq: Four times a day (QID) | ORAL | Status: DC | PRN

## 2017-05-29 MED ORDER — DONEPEZIL HCL 5 MG PO TABS
5.0000 mg | ORAL_TABLET | Freq: Every day | ORAL | Status: DC
  Administered 2017-05-29: 5 mg via ORAL
  Filled 2017-05-29 (×2): qty 1

## 2017-05-29 MED ORDER — PRO-STAT SUGAR FREE PO LIQD
30.0000 mL | Freq: Two times a day (BID) | ORAL | Status: DC
  Administered 2017-05-29 – 2017-05-31 (×4): 30 mL via ORAL
  Filled 2017-05-29 (×5): qty 30

## 2017-05-29 MED ORDER — SENNOSIDES-DOCUSATE SODIUM 8.6-50 MG PO TABS
2.0000 | ORAL_TABLET | Freq: Two times a day (BID) | ORAL | Status: DC
  Administered 2017-05-29 – 2017-05-31 (×3): 2 via ORAL
  Filled 2017-05-29 (×4): qty 2

## 2017-05-29 MED ORDER — HYDROCORTISONE 2.5 % RE CREA
1.0000 "application " | TOPICAL_CREAM | Freq: Two times a day (BID) | RECTAL | Status: DC
  Administered 2017-05-29 – 2017-05-31 (×5): 1 via RECTAL
  Filled 2017-05-29: qty 28.35

## 2017-05-29 MED ORDER — GUAIFENESIN ER 600 MG PO TB12: 600.0000 mg | ORAL_TABLET | Freq: Two times a day (BID) | ORAL | Status: DC | PRN

## 2017-05-29 NOTE — H&P (Signed)
History and Physical    Debbie [name]:[id number] DOB: 04-Sep-1930 DOA: 05/31/2017  PCP: Patient, No Pcp Per Patient coming from: [facility]  Chief Complaint: Rectal Bleeding  HPI: Debbie [name] is a 83 y.o. female with medical history significant of rectal bleeding likely from diverticular bleed, endometrial cancer status post radiation, ESRD on hemodialysis Monday Wednesday Friday, essential hypertension came to the ER for evaluation of rectal bleed.  Patient resides at [facility] where she was noted to have one episode of rectal bleed earlier today therefore sent to the ER for evaluation.  Patient denies any abdominal pain, nausea, vomiting any other complaints.  She admits that she gets these diverticular rectal bleed frequently and has been evaluated extensively in the past. She was here back in August 2018 for the similar reason and underwent flex sigmoidoscopy which showed likely a diverticular bleed. In the ER today she was hemodynamically stable with hemoglobin around 11 which is her baseline.  Her rectal exam was negative and no blood found in her rectal vault.  Rest of the labs were otherwise mostly unremarkable.  While the nurse was obtaining her urine via straight cath she noted obvious stool coming out of her urethra.  CT of the abdomen pelvis done which showed enterovesical fistula, colonic diverticulosis, sizable right-sided pleural effusion, abdominal wall anasarca and extensive aortic atherosclerosis.  Case was discussed with on-call general surgery, Dr Barry Dienes who recommended admitting the patient and consulting urology in the morning for further care.  Review of Systems: As per HPI otherwise 10 point review of systems negative.   Past Medical History:  Diagnosis Date  . Allergy   . Anxiety   . Arthritis   . Cancer ([place]) dx'd 12/2007   endometroid adenocarcinoma  . Cataract    both  . CHF (congestive heart failure) ([place])   . Chronic  kidney disease   . COPD (chronic obstructive pulmonary disease) ([place])   . Dementia   . Diabetes mellitus    Type II  . Dizziness   . Dyspnea   . Dysrhythmia    Paroxysmal Afib  . History of radiation therapy 9/21,9/28,10/05,05/20/2008   4 txs 2400 cGy endometrial adenocarcinoma  . History of radiation therapy 02/12/16-03/19/16,  04/09/16, 04/15/16, 04/22/16   Pelvis/ 50 Gy in 25 fractions and HDR vagina / 18 Gy in 3 fractions  . Hypertension   . Hypotension   . Peripheral vascular disease ([place])   . Pneumonia   . Rectal bleeding 01/2017  . Seasonal allergies   . Secondary malignant neoplasm of vagina ([place])   . UTI (urinary tract infection) 05/2016    Past Surgical History:  Procedure Laterality Date  . A/V FISTULAGRAM Right 01/13/2017   Procedure: A/V Fistulagram - Right Arm;  Surgeon: Serafina Mitchell, MD;  Location: [facility];  Service: Cardiovascular;  Laterality: Right;  . ABDOMINAL HYSTERECTOMY    . APPENDECTOMY    . AV FISTULA PLACEMENT Left 07/17/2016   Procedure: LEFT ARM ARTERIOVENOUS (AV) FISTULA CREATION;  Surgeon: Angelia Mould, MD;  Location: [facility];  Service: Vascular;  Laterality: Left;  . ENDARTERECTOMY Left 01/12/2014   Procedure: LEFT CAROTID ARTERY ENDARTERECTOMY WITH HEMASHIELD PATCH ANGIOPLASTY;  Surgeon: Mal Misty, MD;  Location: [facility];  Service: Vascular;  Laterality: Left;  . ENDARTERECTOMY Right 03/17/2014   Procedure: RIGHT CAROTD ARTERY ENDARTERECTOMY WITH DACRON PATCH ANGIOPLASTY;  Surgeon: Mal Misty, MD;  Location: [facility];  Service: Vascular;  Laterality: Right;  .  EYE SURGERY Bilateral    cataracts  . FISTULA SUPERFICIALIZATION Right 01/29/2017   Procedure: FISTULA SUPERFICIALIZATION RIGHT ARM WITH BRANCH LIGATION;  Surgeon: Serafina Mitchell, MD;  Location: [facility];  Service: Vascular;  Laterality: Right;  . FLEXIBLE SIGMOIDOSCOPY N/A 01/15/2017   Procedure: FLEXIBLE SIGMOIDOSCOPY;  Surgeon: Gatha Mayer, MD;  [facility];   Service: Endoscopy;  Laterality: N/A;  . INSERTION OF DIALYSIS CATHETER Right 07/17/2016   Procedure: INSERTION OF DIALYSIS CATHETER;  Surgeon: Angelia Mould, MD;  Location: [facility];  Service: Vascular;  Laterality: Right;  . NASAL SEPTUM SURGERY    . NECK SURGERY Bilateral    arterial  . TONSILLECTOMY       reports that  has never smoked. she has never used smokeless tobacco. She reports that she does not drink alcohol or use drugs.  Allergies  Allergen Reactions  . Codeine Other (See Comments)    Unusual mouth sensation    Family History  Problem Relation Age of Onset  . Actinic keratosis Mother   . Cancer Mother        uterine  . Heart disease Father   . Hyperlipidemia Father   . Hypertension Father   . Stroke Father   . Cancer Sister        lung     Prior to Admission medications   Medication Sig Start Date End Date Taking? Authorizing Provider  Albuterol Sulfate (PROAIR RESPICLICK) 751 (90 Base) MCG/ACT AEPB Inhale 1-2 puffs into the lungs every 4 (four) hours as needed. Patient taking differently: Inhale 1-2 puffs into the lungs every 4 (four) hours as needed (for shortness of breath).  06/02/16  Yes Patrecia Pour, Christean Grief, MD  Amino Acids-Protein Hydrolys (FEEDING SUPPLEMENT, PRO-STAT SUGAR FREE 64,) LIQD Take 30 mLs by mouth 2 (two) times daily. 11/15/16  Yes Elgergawy, Silver Huguenin, MD  atorvastatin (LIPITOR) 20 MG tablet Take 20 mg by mouth every evening. (1700) 05/26/16  Yes [provider]  B Complex-C-Folic Acid (NEPHRO-VITE PO) Take 1 tablet by mouth daily.    Yes [provider]  donepezil (ARICEPT) 5 MG tablet Take 5 mg by mouth at bedtime.    Yes [provider]  guaiFENesin (MUCINEX) 600 MG 12 hr tablet Take 1 tablet (600 mg total) by mouth 2 (two) times daily as needed. Patient taking differently: Take 600 mg by mouth 2 (two) times daily as needed for cough (congestion).  07/19/16  Yes Bonnielee Haff, MD  midodrine (PROAMATINE) 10 MG  tablet Take 1 tablet (10 mg total) by mouth 2 (two) times daily with a meal. 11/15/16  Yes Elgergawy, Silver Huguenin, MD  oxyCODONE-acetaminophen (PERCOCET/ROXICET) 5-325 MG tablet Take 0.5-1 tablets by mouth every 6 (six) hours as needed. 01/29/17  Yes [name], [name] M, PA-C  PROCTO-MED HC 2.5 % rectal cream Place 1 application rectally 2 (two) times daily. 05/26/17  Yes [provider]  senna-docusate (SENOKOT-S) 8.6-50 MG tablet Take 2 tablets by mouth 2 (two) times daily. 11/15/16 11/15/17 Yes Elgergawy, Silver Huguenin, MD    Physical Exam: Vitals:   05/11/2017 2100 05/16/2017 2130 05/24/2017 2237 05/19/2017 2300  BP: 94/64 95/61 (!) 127/52 (!) 93/52  Pulse: 74 83 68 68  Resp:   18   Temp:      TempSrc:      SpO2: 97% 97% 96% 94%  Weight:      Height:          Constitutional: NAD, calm, comfortable Vitals:   05/19/2017  2100 05/15/2017 2130 06/05/2017 2237 05/23/2017 2300  BP: 94/64 95/61 (!) 127/52 (!) 93/52  Pulse: 74 83 68 68  Resp:   18   Temp:      TempSrc:      SpO2: 97% 97% 96% 94%  Weight:      Height:       Eyes: PERRL, lids and conjunctivae normal ENMT: Mucous membranes are moist. Posterior pharynx clear of any exudate or lesions.Normal dentition.  Neck: normal, supple, no masses, no thyromegaly Respiratory: clear to auscultation bilaterally, no wheezing, no crackles. Normal respiratory effort. No accessory muscle use.  Cardiovascular: Regular rate and rhythm, no murmurs / rubs / gallops. No extremity edema. 2+ pedal pulses. No carotid bruits.  Abdomen: no tenderness, no masses palpated. No hepatosplenomegaly. Bowel sounds positive.  Musculoskeletal: no clubbing / cyanosis. No joint deformity upper and lower extremities. Good ROM, no contractures. Normal muscle tone.  Right upper extremity fistula noted-bruit and thrill felt Skin: no rashes, lesions, ulcers. No induration Neurologic: CN 2-12 grossly intact. Sensation intact, DTR normal. Strength 5/5 in all 4.  Psychiatric: Normal judgment  and insight. Alert and oriented x 3. Normal mood.     Labs on Admission: I have personally reviewed following labs and imaging studies  CBC: Recent Labs  Lab 05/21/2017 1815  WBC 13.2*  HGB 11.7*  HCT 38.2  MCV 92.9  PLT 867   Basic Metabolic Panel: Recent Labs  Lab 06/04/2017 1815  NA 137  K 3.3*  CL 94*  CO2 30  GLUCOSE 148*  BUN 35*  CREATININE 3.56*  CALCIUM 8.6*   GFR: Estimated Creatinine Clearance: 10.7 mL/min (A) (by C-G formula based on SCr of 3.56 mg/dL (H)). Liver Function Tests: Recent Labs  Lab 06/07/2017 1815  AST 28  ALT 18  ALKPHOS 118  BILITOT 0.7  PROT 6.8  ALBUMIN 2.8*   No results for input(s): LIPASE, AMYLASE in the last 168 hours. No results for input(s): AMMONIA in the last 168 hours. Coagulation Profile: No results for input(s): INR, PROTIME in the last 168 hours. Cardiac Enzymes: No results for input(s): CKTOTAL, CKMB, CKMBINDEX, TROPONINI in the last 168 hours. BNP (last 3 results) No results for input(s): PROBNP in the last 8760 hours. HbA1C: No results for input(s): HGBA1C in the last 72 hours. CBG: No results for input(s): GLUCAP in the last 168 hours. Lipid Profile: No results for input(s): CHOL, HDL, LDLCALC, TRIG, CHOLHDL, LDLDIRECT in the last 72 hours. Thyroid Function Tests: No results for input(s): TSH, T4TOTAL, FREET4, T3FREE, THYROIDAB in the last 72 hours. Anemia Panel: No results for input(s): VITAMINB12, FOLATE, FERRITIN, TIBC, IRON, RETICCTPCT in the last 72 hours. Urine analysis:    Component Value Date/Time   COLORURINE YELLOW 06/26/2016 0028   APPEARANCEUR CLOUDY (A) 06/26/2016 0028   LABSPEC 1.013 06/26/2016 0028   PHURINE 5.0 06/26/2016 0028   GLUCOSEU 50 (A) 06/26/2016 0028   HGBUR NEGATIVE 06/26/2016 0028   BILIRUBINUR NEGATIVE 06/26/2016 0028   KETONESUR NEGATIVE 06/26/2016 0028   PROTEINUR 100 (A) 06/26/2016 0028   UROBILINOGEN 1.0 03/07/2014 1510   NITRITE NEGATIVE 06/26/2016 0028   LEUKOCYTESUR  TRACE (A) 06/26/2016 0028   Sepsis Labs: !!!!!!!!!!!!!!!!!!!!!!!!!!!!!!!!!!!!!!!!!!!! @LABRCNTIP (procalcitonin:4,lacticidven:4) )No results found for this or any previous visit (from the past 240 hour(s)).   Radiological Exams on Admission: Ct Abdomen Pelvis Wo Contrast  Result Date: 05/19/2017 CLINICAL DATA:  Intermittent blood in stool. History of endometrial carcinoma. Chronic renal failure EXAM: CT ABDOMEN AND PELVIS WITHOUT CONTRAST TECHNIQUE:  Multidetector CT imaging of the abdomen and pelvis was performed following the standard protocol without IV contrast. Oral contrast was administered. COMPARISON:  November 13, 2016 FINDINGS: Lower chest: There is a sizable right pleural effusion with consolidation in the right lower lobe. There is a minimal left pleural effusion. There are multiple foci of coronary artery calcification. Hepatobiliary: No focal liver lesions are appreciable on this noncontrast enhanced study. There are gallstones within the gallbladder. The gallbladder wall does thickened. There is no biliary duct dilatation. Pancreas: There is no pancreatic mass or inflammatory focus. Spleen: No splenic lesions are evident. Adrenals/Urinary Tract: Adrenals bilaterally appear unremarkable. There is moderate perinephric stranding and fluid surrounding both kidneys. There is a 1.2 x 1.0 cm cyst arising from the anterior mid left kidney. There is no appreciable hydronephrosis on either side. There is no renal or ureteral calculus on either side. There is diffuse urinary bladder wall thickening with extensive air in the bladder. Note that a fat plane between the sigmoid colon in the urinary bladder cannot be ascertained. This appearance raises concern for enterovesical fistula. Stomach/Bowel: There is wall thickening throughout the sigmoid colon. There are multiple diverticula without frank diverticulitis. Note that the sigmoid colon cannot be separated from the urinary bladder, raising concern for  potential enterovesical fistula. There is a sizable ventral hernia. Loops of small bowel extends into this hernia without bowel compromise. A small amount of ascites is present in this area. Elsewhere, there is no appreciable bowel wall or mesenteric thickening. There are diverticula in the descending colon without diverticulitis. No evident bowel obstruction. No free air or portal venous air. Vascular/Lymphatic: There is extensive aortoiliac atherosclerosis. There appears to be hemodynamically significant obstruction in the proximal iliac arteries bilaterally, more severe on the right than on the left. There is calcification in multiple pelvic arterial vessels. There is also calcification in the mesenteric vessels. There appears to be high-grade stenosis in all the proximal major mesenteric vessels without frank obstruction. There is extensive calcification in the hepatic artery extending into the proximal liver. No adenopathy is appreciable in the abdomen or pelvis. Reproductive: Uterus absent.  No well-defined pelvic mass evident. Other: Appendix absent. There is mild ascites in the abdomen, primarily adjacent to the liver and within the ventral hernia repair no abscess is seen in the abdomen or pelvis. There is a degree of abdominal wall anasarca. Musculoskeletal: There is extensive degenerative change in the lumbar spine. There are no blastic or lytic bone lesions. No intramuscular lesion evident. IMPRESSION: 1. A fat plane cannot be established between the sigmoid colon and the urinary bladder. There is air in the urinary bladder. Question enterovesical fistula. 2. Sizable ventral hernia containing loops of small bowel without bowel compromise. No bowel obstruction evident. 3. Left-sided colonic diverticulosis. Wall thickening in the sigmoid colon is likely due to muscular hypertrophy from chronic diverticulosis. No frank diverticulitis appreciable. 4. Sizable right pleural effusion with right base  consolidation. Minimal left pleural effusion. 5.  There is a degree of anasarca in the abdominal wall. 6. Extensive aortic atherosclerosis. There is hemodynamically significant obstruction due to calcification in both common iliac arteries. There is extensive mesenteric arterial atherosclerosis with what appears to be high-grade stenosis in the major mesenteric arteries. There is no frank bowel ischemia evident on this study. There is also extensive coronary artery calcification. 7.  Cholelithiasis. 8.  Mild ascites. 9.  Uterus absent.  Appendix absent. Aortic Atherosclerosis (ICD10-I70.0). Electronically Signed   By: Lowella Grip III M.D.  On: 05/17/2017 22:35    EKG: Independently reviewed.   Assessment/Plan Active Problems:   Rectal bleeding   Rectal bleeding likely from diverticular bleed -Hemoglobin is currently stable, rectal bleeding was noted at the nursing home but Hemoccult negative over here -Sigmoidoscopy back in August 2018 showed sigmoid diverticular bleed. -Hemoglobin remained stable at this time, hemodynamically stable. -No need for blood transfusion.  Will closely monitor for any more signs of bleeding.  Enterovesical fistula -Likely a complication of radiation treatment for uterine cancer -General surgery consulted in the ER, will need urology consult in the morning further recommendations -Provide supportive care  ESRD on hemodialysis, MWF -Has a graft and a fistula which has not matured yet-right upper arm -We will admit the patient to [facility] as she will be getting dialysis tomorrow.  Pleural Effusion, right Mild Ascites  -Fluid overload as she is a dialysis patient.  No hemodynamic compromise at this time.  She makes very minimal urine.  Hopefully dialysis tomorrow will help this.  Anemia of chronic disease -This is likely from her being on dialysis.  Hemoglobin is stable  Metabolic bone disease - Not on binders at this time  History of paroxysmal A.  fib - Not a candidate for anticoagulation due to rectal bleed.   DVT prophylaxis: SCDs due to rectal bleeding Code Status: DO NOT RESUSCITATE Family Communication: Son at bedside Disposition Plan: To be determined Consults called: General surgery called in the ER, will need to consult urology and nephrology in the morning Admission status: Admission to [facility] Arsenio Loader MD [facility] Pager [phone]  If 7PM-7AM, please contact night-coverage [email or web address] Password [email or web address]  05/29/2017, 12:03 AM

## 2017-05-29 NOTE — Consult Note (Signed)
[facility] Renal Consultation Note    Indication for Consultation:  Management of ESRD/hemodialysis; anemia, hypertension/volume and secondary hyperparathyroidism   HPI: Debbie [name] is a 83 y.o. female with ESRD on HD( MWF at [facility]), hypotension (on midodrine), Hx endometrial cancer, COPD, Type 2 DM, and dementia. Hx of diverticular bleeds. Mos recently [facility] admit 01/2017 with hematochezia, flexible sigmoidoscopy with multiple large mouth diverticula in sigmoid colon.   She is admitted with likely enterovesical fistula. Initially sent to ED with concern for rectal bleeding. Nursing home noted dark stool and sent to ED. Rectal exam negative in ED,  FOBT negative and Hgb stable at 11.7. ED notes nurse obtaining stool from urethral catheter. Abdominal CT - "fat plane cannot be established between the sigmoid colon and the urinary bladder. There is air in the urinary bladder. Question enterovesical fistula" Also "extensive mesenteric arterial atherosclerosis" noted. Labs significant for WBC 13.2.  Surgery/urology consulted. UA/culture pending. Zosyn started.   Seen in room and endorses some nausea/vomiting today. Denies fever, chills,  abdominal pain, dysuria, diarrhea, CP, SOB. Feels like she has been her usual self.   Last HD 12/19. Left below EDW, post wt 61.9kg. SBPs usually 90s during HD.    Past Medical History:  Diagnosis Date  . Allergy   . Anxiety   . Arthritis   . Cancer ([place]) dx'd 12/2007   endometroid adenocarcinoma  . Cataract    both  . CHF (congestive heart failure) ([place])   . Chronic kidney disease   . COPD (chronic obstructive pulmonary disease) ([place])   . Dementia   . Diabetes mellitus    Type II  . Dizziness   . Dyspnea   . Dysrhythmia    Paroxysmal Afib  . History of radiation therapy 9/21,9/28,10/05,05/20/2008   4 txs 2400 cGy endometrial adenocarcinoma  . History of radiation therapy 02/12/16-03/19/16,  04/09/16,  04/15/16, 04/22/16   Pelvis/ 50 Gy in 25 fractions and HDR vagina / 18 Gy in 3 fractions  . Hypertension   . Hypotension   . Peripheral vascular disease ([place])   . Pneumonia   . Rectal bleeding 01/2017  . Seasonal allergies   . Secondary malignant neoplasm of vagina ([place])   . UTI (urinary tract infection) 05/2016   Past Surgical History:  Procedure Laterality Date  . A/V FISTULAGRAM Right 01/13/2017   Procedure: A/V Fistulagram - Right Arm;  Surgeon: Serafina Mitchell, MD;  Location: [place] [facility];  Service: Cardiovascular;  Laterality: Right;  . ABDOMINAL HYSTERECTOMY    . APPENDECTOMY    . AV FISTULA PLACEMENT Left 07/17/2016   Procedure: LEFT ARM ARTERIOVENOUS (AV) FISTULA CREATION;  Surgeon: Angelia Mould, MD;  Location: [place];  Service: Vascular;  Laterality: Left;  . ENDARTERECTOMY Left 01/12/2014   Procedure: LEFT CAROTID ARTERY ENDARTERECTOMY WITH HEMASHIELD PATCH ANGIOPLASTY;  Surgeon: Mal Misty, MD;  Location: [place];  Service: Vascular;  Laterality: Left;  . ENDARTERECTOMY Right 03/17/2014   Procedure: RIGHT CAROTD ARTERY ENDARTERECTOMY WITH DACRON PATCH ANGIOPLASTY;  Surgeon: Mal Misty, MD;  Location: [place];  Service: Vascular;  Laterality: Right;  . EYE SURGERY Bilateral    cataracts  . FISTULA SUPERFICIALIZATION Right 01/29/2017   Procedure: FISTULA SUPERFICIALIZATION RIGHT ARM WITH BRANCH LIGATION;  Surgeon: Serafina Mitchell, MD;  Location: [place];  Service: Vascular;  Laterality: Right;  . FLEXIBLE SIGMOIDOSCOPY N/A 01/15/2017   Procedure: FLEXIBLE SIGMOIDOSCOPY;  Surgeon: Gatha Mayer, MD;  Location: [place];  Service: Endoscopy;  Laterality: N/A;  . INSERTION OF DIALYSIS CATHETER Right 07/17/2016   Procedure: INSERTION OF DIALYSIS CATHETER;  Surgeon: Angelia Mould, MD;  Location: [place];  Service: Vascular;  Laterality: Right;  . NASAL SEPTUM SURGERY    . NECK SURGERY Bilateral    arterial  . TONSILLECTOMY     Family History  Problem  Relation Age of Onset  . Actinic keratosis Mother   . Cancer Mother        uterine  . Heart disease Father   . Hyperlipidemia Father   . Hypertension Father   . Stroke Father   . Cancer Sister        lung   Social History:  reports that  has never smoked. she has never used smokeless tobacco. She reports that she does not drink alcohol or use drugs. Allergies  Allergen Reactions  . Codeine Other (See Comments)    Unusual mouth sensation   Prior to Admission medications   Medication Sig Start Date End Date Taking? Authorizing Provider  Albuterol Sulfate (PROAIR RESPICLICK) 798 (90 Base) MCG/ACT AEPB Inhale 1-2 puffs into the lungs every 4 (four) hours as needed. Patient taking differently: Inhale 1-2 puffs into the lungs every 4 (four) hours as needed (for shortness of breath).  06/02/16  Yes Patrecia Pour, Christean Grief, MD  Amino Acids-Protein Hydrolys (FEEDING SUPPLEMENT, PRO-STAT SUGAR FREE 64,) LIQD Take 30 mLs by mouth 2 (two) times daily. 11/15/16  Yes Elgergawy, Silver Huguenin, MD  atorvastatin (LIPITOR) 20 MG tablet Take 20 mg by mouth every evening. (1700) 05/26/16  Yes [provider]  B Complex-C-Folic Acid (NEPHRO-VITE PO) Take 1 tablet by mouth daily.    Yes [provider]  donepezil (ARICEPT) 5 MG tablet Take 5 mg by mouth at bedtime.    Yes [provider]  guaiFENesin (MUCINEX) 600 MG 12 hr tablet Take 1 tablet (600 mg total) by mouth 2 (two) times daily as needed. Patient taking differently: Take 600 mg by mouth 2 (two) times daily as needed for cough (congestion).  07/19/16  Yes Bonnielee Haff, MD  midodrine (PROAMATINE) 10 MG tablet Take 1 tablet (10 mg total) by mouth 2 (two) times daily with a meal. 11/15/16  Yes Elgergawy, Silver Huguenin, MD  oxyCODONE-acetaminophen (PERCOCET/ROXICET) 5-325 MG tablet Take 0.5-1 tablets by mouth every 6 (six) hours as needed. 01/29/17  Yes [name], [name] M, PA-C  PROCTO-MED HC 2.5 % rectal cream Place 1 application rectally 2 (two)  times daily. 05/26/17  Yes [provider]  senna-docusate (SENOKOT-S) 8.6-50 MG tablet Take 2 tablets by mouth 2 (two) times daily. 11/15/16 11/15/17 Yes Elgergawy, Silver Huguenin, MD   Current Facility-Administered Medications  Medication Dose Route Frequency Provider Last Rate Last Dose  . acetaminophen (TYLENOL) tablet 650 mg  650 mg Oral Q6H PRN Amin, Ankit Chirag, MD       Or  . acetaminophen (TYLENOL) suppository 650 mg  650 mg Rectal Q6H PRN Amin, Ankit Chirag, MD      . atorvastatin (LIPITOR) tablet 20 mg  20 mg Oral QPM Amin, Ankit Chirag, MD      . bisacodyl (DULCOLAX) EC tablet 5 mg  5 mg Oral Daily PRN Amin, Ankit Chirag, MD      . donepezil (ARICEPT) tablet 5 mg  5 mg Oral QHS Amin, Ankit Chirag, MD      . feeding supplement (PRO-STAT SUGAR FREE 64) liquid 30 mL  30 mL Oral BID Amin, Jeanella Flattery, MD  30 mL at 05/29/17 0947  . guaiFENesin (MUCINEX) 12 hr tablet 600 mg  600 mg Oral BID PRN Amin, Ankit Chirag, MD      . HYDROcodone-acetaminophen (NORCO/VICODIN) 5-325 MG per tablet 1-2 tablet  1-2 tablet Oral Q4H PRN Damita Lack, MD   2 tablet at 05/29/17 0947  . hydrocortisone (ANUSOL-HC) 2.5 % rectal cream 1 application  1 application Rectal BID Damita Lack, MD   1 application at 52/84/13 0947  . midodrine (PROAMATINE) tablet 10 mg  10 mg Oral BID WC Amin, Ankit Chirag, MD   10 mg at 05/29/17 0946  . ondansetron (ZOFRAN) tablet 4 mg  4 mg Oral Q6H PRN Amin, Ankit Chirag, MD       Or  . ondansetron (ZOFRAN) injection 4 mg  4 mg Intravenous Q6H PRN Amin, Ankit Chirag, MD   4 mg at 05/29/17 1059  . oxyCODONE-acetaminophen (PERCOCET/ROXICET) 5-325 MG per tablet 0.5-1 tablet  0.5-1 tablet Oral Q6H PRN Amin, Ankit Chirag, MD      . piperacillin-tazobactam (ZOSYN) IVPB 3.375 g  3.375 g Intravenous Q12H Charlton Haws, RPH 12.5 mL/hr at 05/29/17 1244 3.375 g at 05/29/17 1244  . senna-docusate (Senokot-S) tablet 1 tablet  1 tablet Oral QHS PRN Amin, Ankit Chirag, MD       . senna-docusate (Senokot-S) tablet 2 tablet  2 tablet Oral BID Amin, Ankit Chirag, MD        ROS: As per HPI otherwise negative.  Physical Exam: Vitals:   05/29/17 0221 05/29/17 0255 05/29/17 0611 05/29/17 0827  BP: (!) 86/55 (!) 85/50 (!) 88/59 (!) 90/44  Pulse: 72  76 74  Resp: 18  18 18   Temp: 97.8 F (36.6 C)  98.3 F (36.8 C) 98 F (36.7 C)  TempSrc: Oral  Oral Oral  SpO2: 94%  98% 95%  Weight: 62.8 kg (138 lb 7.2 oz)     Height:         General: Elderly female, pleasant NAD  Head: NCAT sclera not icteric MMM Neck: Supple. No JVD No masses Lungs: CTA bilaterally without wheezes, rales, or rhonchi. Breathing is unlabored. Heart: RRR with S1 S2 Abdomen: soft NT + BS Lower extremities:without edema or ischemic changes, no open wounds  Neuro: A & O  X 2.  Memory deficits. Moves all extremities spontaneously. Psych:  Responds to questions appropriately with a normal affect. Dialysis Access: RIJ TDC dsg intact, R AVF -bruit   Labs: Basic Metabolic Panel: Recent Labs  Lab 05/15/2017 1815  NA 137  K 3.3*  CL 94*  CO2 30  GLUCOSE 148*  BUN 35*  CREATININE 3.56*  CALCIUM 8.6*   Liver Function Tests: Recent Labs  Lab 05/30/2017 1815  AST 28  ALT 18  ALKPHOS 118  BILITOT 0.7  PROT 6.8  ALBUMIN 2.8*   No results for input(s): LIPASE, AMYLASE in the last 168 hours. No results for input(s): AMMONIA in the last 168 hours. CBC: Recent Labs  Lab 05/13/2017 1815  WBC 13.2*  HGB 11.7*  HCT 38.2  MCV 92.9  PLT 227   Cardiac Enzymes: No results for input(s): CKTOTAL, CKMB, CKMBINDEX, TROPONINI in the last 168 hours. CBG: Recent Labs  Lab 05/29/17 0228  GLUCAP 87   Iron Studies: No results for input(s): IRON, TIBC, TRANSFERRIN, FERRITIN in the last 72 hours. Studies/Results: Ct Abdomen Pelvis Wo Contrast  Result Date: 05/26/2017 CLINICAL DATA:  Intermittent blood in stool. History of endometrial carcinoma. Chronic renal failure EXAM: CT  ABDOMEN AND PELVIS  WITHOUT CONTRAST TECHNIQUE: Multidetector CT imaging of the abdomen and pelvis was performed following the standard protocol without IV contrast. Oral contrast was administered. COMPARISON:  November 13, 2016 FINDINGS: Lower chest: There is a sizable right pleural effusion with consolidation in the right lower lobe. There is a minimal left pleural effusion. There are multiple foci of coronary artery calcification. Hepatobiliary: No focal liver lesions are appreciable on this noncontrast enhanced study. There are gallstones within the gallbladder. The gallbladder wall does thickened. There is no biliary duct dilatation. Pancreas: There is no pancreatic mass or inflammatory focus. Spleen: No splenic lesions are evident. Adrenals/Urinary Tract: Adrenals bilaterally appear unremarkable. There is moderate perinephric stranding and fluid surrounding both kidneys. There is a 1.2 x 1.0 cm cyst arising from the anterior mid left kidney. There is no appreciable hydronephrosis on either side. There is no renal or ureteral calculus on either side. There is diffuse urinary bladder wall thickening with extensive air in the bladder. Note that a fat plane between the sigmoid colon in the urinary bladder cannot be ascertained. This appearance raises concern for enterovesical fistula. Stomach/Bowel: There is wall thickening throughout the sigmoid colon. There are multiple diverticula without frank diverticulitis. Note that the sigmoid colon cannot be separated from the urinary bladder, raising concern for potential enterovesical fistula. There is a sizable ventral hernia. Loops of small bowel extends into this hernia without bowel compromise. A small amount of ascites is present in this area. Elsewhere, there is no appreciable bowel wall or mesenteric thickening. There are diverticula in the descending colon without diverticulitis. No evident bowel obstruction. No free air or portal venous air. Vascular/Lymphatic: There is extensive  aortoiliac atherosclerosis. There appears to be hemodynamically significant obstruction in the proximal iliac arteries bilaterally, more severe on the right than on the left. There is calcification in multiple pelvic arterial vessels. There is also calcification in the mesenteric vessels. There appears to be high-grade stenosis in all the proximal major mesenteric vessels without frank obstruction. There is extensive calcification in the hepatic artery extending into the proximal liver. No adenopathy is appreciable in the abdomen or pelvis. Reproductive: Uterus absent.  No well-defined pelvic mass evident. Other: Appendix absent. There is mild ascites in the abdomen, primarily adjacent to the liver and within the ventral hernia repair no abscess is seen in the abdomen or pelvis. There is a degree of abdominal wall anasarca. Musculoskeletal: There is extensive degenerative change in the lumbar spine. There are no blastic or lytic bone lesions. No intramuscular lesion evident. IMPRESSION: 1. A fat plane cannot be established between the sigmoid colon and the urinary bladder. There is air in the urinary bladder. Question enterovesical fistula. 2. Sizable ventral hernia containing loops of small bowel without bowel compromise. No bowel obstruction evident. 3. Left-sided colonic diverticulosis. Wall thickening in the sigmoid colon is likely due to muscular hypertrophy from chronic diverticulosis. No frank diverticulitis appreciable. 4. Sizable right pleural effusion with right base consolidation. Minimal left pleural effusion. 5.  There is a degree of anasarca in the abdominal wall. 6. Extensive aortic atherosclerosis. There is hemodynamically significant obstruction due to calcification in both common iliac arteries. There is extensive mesenteric arterial atherosclerosis with what appears to be high-grade stenosis in the major mesenteric arteries. There is no frank bowel ischemia evident on this study. There is also  extensive coronary artery calcification. 7.  Cholelithiasis. 8.  Mild ascites. 9.  Uterus absent.  Appendix absent. Aortic Atherosclerosis (ICD10-I70.0). Electronically Signed  By: Lowella Grip III M.D.   On: 05/31/2017 22:35    Dialysis Orders:  AF MWF  4h 180F 400/800 EDW 63kg 3K/2.25Ca  TDC Hep bolus 2200U Venofer 100mg  IV x10 (until 12/26) No VDRA, No ESA No binders   OP Labs: Hgb 11.0 Tsat 12% Corr Ca 9.8 P 4.3 Alb 2.8 iPTH 211   Assessment/Plan: 1. Colovesical fistula on abd CT - WBC 13.2 on admit. UA/culture pending, empiric Zoysn started. Surgery has evaluated and can follow as outpatient. Would not be a candidate for aggressive intervention. Urology consult pending.  2. ESRD -  MWF - Continue on schedule. 4K bath for K 3.3, Check labs pre HD  3. Hypotension - on midodrine. UF as tolerated 1-2L. Getting below EDW at outpatient center. Follow weights 4. Anemia  - Hgb 11.7. Hold IV Fe with antibiotics  5. Metabolic bone disease -  Ca/P controlled. No binders. Follow labs  6. Nutrition - Renal diet/vitamins/prostat  7. Demntia 8. DNR   Lynnda Child PA-C [facility] Pager [phone] 05/29/2017, 12:58 PM

## 2017-05-29 NOTE — Progress Notes (Signed)
Pharmacy Antibiotic Note  Debbie [name] is a 83 y.o. female admitted on 05/14/2017 with rectal bleeding, possible UTI. Pharmacy has been consulted for Zosyn dosing for colovesical fistula to start AFTER UA is collected.  WBC 13.2, patient is afebrile. Patient is not making urine per RN, will attempt in-and-out cath for UA collection. Surgery and urology consulted; surgery suspected polymicrobial UTI.    Plan: Zosyn 3.375 gm IV q 12 hours - 4 hour infusion (first dose given after UA collection) Follow UA results, culture results, clinical status   Height: 5\' 2"  (157.5 cm) Weight: 138 lb 7.2 oz (62.8 kg) IBW/kg (Calculated) : 50.1  Temp (24hrs), Avg:98 F (36.7 C), Min:97.8 F (36.6 C), Max:98.3 F (36.8 C)  Recent Labs  Lab 05/18/2017 1815  WBC 13.2*  CREATININE 3.56*    Estimated Creatinine Clearance: 10.3 mL/min (A) (by C-G formula based on SCr of 3.56 mg/dL (H)).    Allergies  Allergen Reactions  . Codeine Other (See Comments)    Unusual mouth sensation    Antimicrobials this admission: Zosyn 12/21 >>  Microbiology results: UA >  Thank you for allowing pharmacy to be a part of this patient's care.  Charlene Brooke, PharmD PGY1 Pharmacy Resident [facility] [phone] 05/29/2017 11:02 AM

## 2017-05-29 NOTE — Progress Notes (Signed)
PROGRESS NOTE    Debbie [name]  HUT:[id number] DOB: December 16, 1930 DOA: 06/06/2017 PCP: Patient, No Pcp Per    Brief Narrative: Debbie [name] is a 83 y.o. female with medical history significant of rectal bleeding likely from diverticular bleed, endometrial cancer status post radiation, ESRD on hemodialysis Monday Wednesday Friday, essential hypertension came to the ER for evaluation of rectal bleed.  Patient resides at [facility] where she was noted to have one episode of rectal bleed earlier today therefore sent to the ER for evaluation.  Patient denies any abdominal pain, nausea, vomiting any other complaints.  She admits that she gets these diverticular rectal bleed frequently and has been evaluated extensively in the past. She was here back in August 2018 for the similar reason and underwent flex sigmoidoscopy which showed likely a diverticular bleed. In the ER today she was hemodynamically stable with hemoglobin around 11 which is her baseline.  Her rectal exam was negative and no blood found in her rectal vault.  Rest of the labs were otherwise mostly unremarkable.  While the nurse was obtaining her urine via straight cath she noted obvious stool coming out of her urethra.  CT of the abdomen pelvis done which showed enterovesical fistula, colonic diverticulosis, sizable right-sided pleural effusion, abdominal wall anasarca and extensive aortic atherosclerosis.  Case was discussed with on-call general surgery, Dr Barry Dienes who recommended admitting the patient and consulting urology in the morning for further care.    Assessment & Plan:   Active Problems:   Rectal bleeding   Rectal bleeding, prior history of diverticular bleeding.  Hb pending for the morning.   Enterovesical fistula;  Sx and urology consulted.  Start Unasyn after UA obtain.   ESRD; HD M, W, F HD today.  Nephrology consulted.   Pleural Effusion, right Mild Ascites  hemodialysis today.  Follow  chest x ray.   History of paroxysmal A. fib - Not a candidate for anticoagulation due to rectal bleed.    DVT prophylaxis: SCD.  Code Status: DNR Family Communication: son at bedside.  Disposition Plan: to be determine   Consultants:   Urology  Surgery    Procedures: HD  Antimicrobials: Zosyn   Subjective: She is complaining of nausea, mild abdominal discomfort.   Objective: Vitals:   05/29/17 0221 05/29/17 0255 05/29/17 0611 05/29/17 0827  BP: (!) 86/55 (!) 85/50 (!) 88/59 (!) 90/44  Pulse: 72  76 74  Resp: 18  18 18   Temp: 97.8 F (36.6 C)  98.3 F (36.8 C) 98 F (36.7 C)  TempSrc: Oral  Oral Oral  SpO2: 94%  98% 95%  Weight: 62.8 kg (138 lb 7.2 oz)     Height:        Intake/Output Summary (Last 24 hours) at 05/29/2017 1053 Last data filed at 05/29/2017 0612 Gross per 24 hour  Intake 0 ml  Output 0 ml  Net 0 ml   Filed Weights   05/16/2017 1718 05/29/17 0221  Weight: 68.9 kg (152 lb) 62.8 kg (138 lb 7.2 oz)    Examination:  General exam: Appears calm and comfortable  Respiratory system: Clear to auscultation. Respiratory effort normal. Cardiovascular system: S1 & S2 heard, RRR. No JVD, murmurs, rubs, gallops or clicks. No pedal edema. Gastrointestinal system: Abdomen soft, ventral hernia. No rigidity  Central nervous system: Alert and oriented. No focal neurological deficits. Extremities: Symmetric 5 x 5 power.    Data Reviewed: I have personally reviewed following labs and imaging studies  CBC:  Recent Labs  Lab 05/31/2017 1815  WBC 13.2*  HGB 11.7*  HCT 38.2  MCV 92.9  PLT 846   Basic Metabolic Panel: Recent Labs  Lab 05/30/2017 1815  NA 137  K 3.3*  CL 94*  CO2 30  GLUCOSE 148*  BUN 35*  CREATININE 3.56*  CALCIUM 8.6*   GFR: Estimated Creatinine Clearance: 10.3 mL/min (A) (by C-G formula based on SCr of 3.56 mg/dL (H)). Liver Function Tests: Recent Labs  Lab 05/31/2017 1815  AST 28  ALT 18  ALKPHOS 118  BILITOT 0.7  PROT  6.8  ALBUMIN 2.8*   No results for input(s): LIPASE, AMYLASE in the last 168 hours. No results for input(s): AMMONIA in the last 168 hours. Coagulation Profile: No results for input(s): INR, PROTIME in the last 168 hours. Cardiac Enzymes: No results for input(s): CKTOTAL, CKMB, CKMBINDEX, TROPONINI in the last 168 hours. BNP (last 3 results) No results for input(s): PROBNP in the last 8760 hours. HbA1C: No results for input(s): HGBA1C in the last 72 hours. CBG: Recent Labs  Lab 05/29/17 0228  GLUCAP 87   Lipid Profile: No results for input(s): CHOL, HDL, LDLCALC, TRIG, CHOLHDL, LDLDIRECT in the last 72 hours. Thyroid Function Tests: No results for input(s): TSH, T4TOTAL, FREET4, T3FREE, THYROIDAB in the last 72 hours. Anemia Panel: No results for input(s): VITAMINB12, FOLATE, FERRITIN, TIBC, IRON, RETICCTPCT in the last 72 hours. Sepsis Labs: No results for input(s): PROCALCITON, LATICACIDVEN in the last 168 hours.  Recent Results (from the past 240 hour(s))  MRSA PCR Screening     Status: None   Collection Time: 05/29/17  2:13 AM  Result Value Ref Range Status   MRSA by PCR NEGATIVE NEGATIVE Final    Comment:        The GeneXpert MRSA Assay (FDA approved for NASAL specimens only), is one component of a comprehensive MRSA colonization surveillance program. It is not intended to diagnose MRSA infection nor to guide or monitor treatment for MRSA infections.          Radiology Studies: Ct Abdomen Pelvis Wo Contrast  Result Date: 05/14/2017 CLINICAL DATA:  Intermittent blood in stool. History of endometrial carcinoma. Chronic renal failure EXAM: CT ABDOMEN AND PELVIS WITHOUT CONTRAST TECHNIQUE: Multidetector CT imaging of the abdomen and pelvis was performed following the standard protocol without IV contrast. Oral contrast was administered. COMPARISON:  November 13, 2016 FINDINGS: Lower chest: There is a sizable right pleural effusion with consolidation in the right  lower lobe. There is a minimal left pleural effusion. There are multiple foci of coronary artery calcification. Hepatobiliary: No focal liver lesions are appreciable on this noncontrast enhanced study. There are gallstones within the gallbladder. The gallbladder wall does thickened. There is no biliary duct dilatation. Pancreas: There is no pancreatic mass or inflammatory focus. Spleen: No splenic lesions are evident. Adrenals/Urinary Tract: Adrenals bilaterally appear unremarkable. There is moderate perinephric stranding and fluid surrounding both kidneys. There is a 1.2 x 1.0 cm cyst arising from the anterior mid left kidney. There is no appreciable hydronephrosis on either side. There is no renal or ureteral calculus on either side. There is diffuse urinary bladder wall thickening with extensive air in the bladder. Note that a fat plane between the sigmoid colon in the urinary bladder cannot be ascertained. This appearance raises concern for enterovesical fistula. Stomach/Bowel: There is wall thickening throughout the sigmoid colon. There are multiple diverticula without frank diverticulitis. Note that the sigmoid colon cannot be separated from the urinary  bladder, raising concern for potential enterovesical fistula. There is a sizable ventral hernia. Loops of small bowel extends into this hernia without bowel compromise. A small amount of ascites is present in this area. Elsewhere, there is no appreciable bowel wall or mesenteric thickening. There are diverticula in the descending colon without diverticulitis. No evident bowel obstruction. No free air or portal venous air. Vascular/Lymphatic: There is extensive aortoiliac atherosclerosis. There appears to be hemodynamically significant obstruction in the proximal iliac arteries bilaterally, more severe on the right than on the left. There is calcification in multiple pelvic arterial vessels. There is also calcification in the mesenteric vessels. There appears to  be high-grade stenosis in all the proximal major mesenteric vessels without frank obstruction. There is extensive calcification in the hepatic artery extending into the proximal liver. No adenopathy is appreciable in the abdomen or pelvis. Reproductive: Uterus absent.  No well-defined pelvic mass evident. Other: Appendix absent. There is mild ascites in the abdomen, primarily adjacent to the liver and within the ventral hernia repair no abscess is seen in the abdomen or pelvis. There is a degree of abdominal wall anasarca. Musculoskeletal: There is extensive degenerative change in the lumbar spine. There are no blastic or lytic bone lesions. No intramuscular lesion evident. IMPRESSION: 1. A fat plane cannot be established between the sigmoid colon and the urinary bladder. There is air in the urinary bladder. Question enterovesical fistula. 2. Sizable ventral hernia containing loops of small bowel without bowel compromise. No bowel obstruction evident. 3. Left-sided colonic diverticulosis. Wall thickening in the sigmoid colon is likely due to muscular hypertrophy from chronic diverticulosis. No frank diverticulitis appreciable. 4. Sizable right pleural effusion with right base consolidation. Minimal left pleural effusion. 5.  There is a degree of anasarca in the abdominal wall. 6. Extensive aortic atherosclerosis. There is hemodynamically significant obstruction due to calcification in both common iliac arteries. There is extensive mesenteric arterial atherosclerosis with what appears to be high-grade stenosis in the major mesenteric arteries. There is no frank bowel ischemia evident on this study. There is also extensive coronary artery calcification. 7.  Cholelithiasis. 8.  Mild ascites. 9.  Uterus absent.  Appendix absent. Aortic Atherosclerosis (ICD10-I70.0). Electronically Signed   By: Lowella Grip III M.D.   On: 05/31/2017 22:35        Scheduled Meds: . atorvastatin  20 mg Oral QPM  . donepezil  5  mg Oral QHS  . feeding supplement (PRO-STAT SUGAR FREE 64)  30 mL Oral BID  . hydrocortisone  1 application Rectal BID  . midodrine  10 mg Oral BID WC  . senna-docusate  2 tablet Oral BID   Continuous Infusions:   LOS: 1 day    Time spent: 35 minutes.     Elmarie Shiley, MD [facility] Pager [phone]  If 7PM-7AM, please contact night-coverage [email or web address] Password [facility] 05/29/2017, 10:53 AM

## 2017-05-29 NOTE — Consult Note (Signed)
Urology Consult   Physician requesting consult: Dr. Barry Dienes  Reason for consult: Probable colovesical fistula  History of Present Illness: Debbie [name] is a 83 y.o. with multiple medical comorbidities including ESRD on hemodialysis (MWF), chronic hypotension managed with midodrine, endometrial cancer s/p radiation therapy, COPD, diabetes, dementia, CHF, paroxysmal atrial fibrillation, and history of carotid artery stenosis and PVD s/p CEA. She does have a DNR order. She developed hematochezia in August 2018 prompting GI evaluation with sigmoidoscopy that revealed prominent diverticular disease as the most likely cause with no malignancy noted.  She was admitted again now with recurrent rectal bleeding.  Hgb was 11.7 and she has not required further treatment.  She did, however, have a CT scan of the abdomen and pelvis that indicated a large amount of air in the bladder and in and out catheterization per the nursing staff reportedly demonstrated obvious stool in the urine. The patient does not report fecaluria, pneumaturia, or dysuria, frequency, urgency, or history of recurrent UTIs although she is not a reliable historian.  She has not had any recent fever or symptoms of systemic infection and her son does not report any of the previously mentioned symptoms to his knowledge.   She has undergone a prior abdominal hysterectomy for endometrial cancer with recurrence in 2017.  For her recurrence, she was treated with radiation therapy.  She denies a history of voiding or storage urinary symptoms, hematuria, UTIs, STDs, urolithiasis, GU malignancy/trauma/surgery.   Past Medical History:  Diagnosis Date  . Allergy   . Anxiety   . Arthritis   . Cancer ([place]) dx'd 12/2007   endometroid adenocarcinoma  . Cataract    both  . CHF (congestive heart failure) ([place])   . Chronic kidney disease   . COPD (chronic obstructive pulmonary disease) ([place])   . Dementia   . Diabetes mellitus    Type II  .  Dizziness   . Dyspnea   . Dysrhythmia    Paroxysmal Afib  . History of radiation therapy 9/21,9/28,10/05,05/20/2008   4 txs 2400 cGy endometrial adenocarcinoma  . History of radiation therapy 02/12/16-03/19/16,  04/09/16, 04/15/16, 04/22/16   Pelvis/ 50 Gy in 25 fractions and HDR vagina / 18 Gy in 3 fractions  . Hypertension   . Hypotension   . Peripheral vascular disease ([place])   . Pneumonia   . Rectal bleeding 01/2017  . Seasonal allergies   . 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 UTI (urinary tract infection) 05/2016    Past Surgical History:  Procedure Laterality Date  . A/V FISTULAGRAM Right 01/13/2017   Procedure: A/V Fistulagram - Right Arm;  Surgeon: Serafina Mitchell, MD;  Location: [facility];  Service: Cardiovascular;  Laterality: Right;  . ABDOMINAL HYSTERECTOMY    . APPENDECTOMY    . AV FISTULA PLACEMENT Left 07/17/2016   Procedure: LEFT ARM ARTERIOVENOUS (AV) FISTULA CREATION;  Surgeon: Angelia Mould, MD;  Location: [facility];  Service: Vascular;  Laterality: Left;  . ENDARTERECTOMY Left 01/12/2014   Procedure: LEFT CAROTID ARTERY ENDARTERECTOMY WITH HEMASHIELD PATCH ANGIOPLASTY;  Surgeon: Mal Misty, MD;  Location: [facility];  Service: Vascular;  Laterality: Left;  . ENDARTERECTOMY Right 03/17/2014   Procedure: RIGHT CAROTD ARTERY ENDARTERECTOMY WITH DACRON PATCH ANGIOPLASTY;  Surgeon: Mal Misty, MD;  Location: [facility];  Service: Vascular;  Laterality: Right;  . EYE SURGERY Bilateral    cataracts  . FISTULA SUPERFICIALIZATION Right 01/29/2017   Procedure: FISTULA SUPERFICIALIZATION RIGHT ARM WITH BRANCH  LIGATION;  Surgeon: Serafina Mitchell, MD;  Location: [facility];  Service: Vascular;  Laterality: Right;  . FLEXIBLE SIGMOIDOSCOPY N/A 01/15/2017   Procedure: FLEXIBLE SIGMOIDOSCOPY;  Surgeon: Gatha Mayer, MD;  Location: [facility] END[facility];  Service: Endoscopy;  Laterality: N/A;  . INSERTION OF DIALYSIS CATHETER Right 07/17/2016   Procedure: INSERTION OF  DIALYSIS CATHETER;  Surgeon: Angelia Mould, MD;  Location: [facility];  Service: Vascular;  Laterality: Right;  . NASAL SEPTUM SURGERY    . NECK SURGERY Bilateral    arterial  . TONSILLECTOMY       Current Hospital Medications:  Home meds:  Current Meds  Medication Sig  . Albuterol Sulfate (PROAIR RESPICLICK) 423 (90 Base) MCG/ACT AEPB Inhale 1-2 puffs into the lungs every 4 (four) hours as needed. (Patient taking differently: Inhale 1-2 puffs into the lungs every 4 (four) hours as needed (for shortness of breath). )  . Amino Acids-Protein Hydrolys (FEEDING SUPPLEMENT, PRO-STAT SUGAR FREE 64,) LIQD Take 30 mLs by mouth 2 (two) times daily.  [name] atorvastatin (LIPITOR) 20 MG tablet Take 20 mg by mouth every evening. (1700)  . B Complex-C-Folic Acid (NEPHRO-VITE PO) Take 1 tablet by mouth daily.   [name] donepezil (ARICEPT) 5 MG tablet Take 5 mg by mouth at bedtime.   [name] guaiFENesin (MUCINEX) 600 MG 12 hr tablet Take 1 tablet (600 mg total) by mouth 2 (two) times daily as needed. (Patient taking differently: Take 600 mg by mouth 2 (two) times daily as needed for cough (congestion). )  . midodrine (PROAMATINE) 10 MG tablet Take 1 tablet (10 mg total) by mouth 2 (two) times daily with a meal.  . oxyCODONE-acetaminophen (PERCOCET/ROXICET) 5-325 MG tablet Take 0.5-1 tablets by mouth every 6 (six) hours as needed.  [name] PROCTO-MED HC 2.5 % rectal cream Place 1 application rectally 2 (two) times daily.  [name] senna-docusate (SENOKOT-S) 8.6-50 MG tablet Take 2 tablets by mouth 2 (two) times daily.    Scheduled Meds: . atorvastatin  20 mg Oral QPM  . donepezil  5 mg Oral QHS  . feeding supplement (PRO-STAT SUGAR FREE 64)  30 mL Oral BID  . hydrocortisone  1 application Rectal BID  . midodrine  10 mg Oral BID WC  . senna-docusate  2 tablet Oral BID   Continuous Infusions: . piperacillin-tazobactam (ZOSYN)  IV Stopped (05/29/17 1644)   PRN Meds:.acetaminophen **OR** acetaminophen, bisacodyl, guaiFENesin,  HYDROcodone-acetaminophen, ondansetron **OR** ondansetron (ZOFRAN) IV, oxyCODONE-acetaminophen, senna-docusate  Allergies:  Allergies  Allergen Reactions  . Codeine Other (See Comments)    Unusual mouth sensation    Family History  Problem Relation Age of Onset  . Actinic keratosis Mother   . Cancer Mother        uterine  . Heart disease Father   . Hyperlipidemia Father   . Hypertension Father   . Stroke Father   . Cancer Sister        lung    Social History:  reports that  has never smoked. she has never used smokeless tobacco. She reports that she does not drink alcohol or use drugs.  ROS: A complete review of systems was performed.  All systems are negative except for pertinent findings as noted.  Physical Exam:  Vital signs in last 24 hours: Temp:  [97.8 F (36.6 C)-98.3 F (36.8 C)] 98 F (36.7 C) (12/21 1658) Pulse Rate:  [65-87] 87 (12/21 1658) Resp:  [18] 18 (12/21 1658) BP: (85-127)/(44-67) 98/61 (12/21 1658) SpO2:  [92 %-98 %] 93 % (  12/21 1658) Weight:  [62.8 kg (138 lb 7.2 oz)] 62.8 kg (138 lb 7.2 oz) (12/21 0221) Constitutional:  Alert and oriented, No acute distress Cardiovascular: Regular rate and rhythm, No JVD Respiratory: Normal respiratory effort, Lungs clear bilaterally GI: Abdomen is soft, nontender, nondistended, no abdominal masses GU: No CVA tenderness Lymphatic: No lymphadenopathy Neurologic: Grossly intact, no focal deficits Psychiatric: Normal mood and affect  Laboratory Data:  Recent Labs    05/09/2017 1815 05/29/17 1615  WBC 13.2* 9.9  HGB 11.7* 11.0*  HCT 38.2 35.7*  PLT 227 248    Recent Labs    06/07/2017 1815  NA 137  K 3.3*  CL 94*  GLUCOSE 148*  BUN 35*  CALCIUM 8.6*  CREATININE 3.56*     Results for orders placed or performed during the hospital encounter of 05/16/2017 (from the past 24 hour(s))  MRSA PCR Screening     Status: None   Collection Time: 05/29/17  2:13 AM  Result Value Ref Range   MRSA by PCR NEGATIVE  NEGATIVE  Glucose, capillary     Status: None   Collection Time: 05/29/17  2:28 AM  Result Value Ref Range   Glucose-Capillary 87 65 - 99 mg/dL  Urinalysis, Routine w reflex microscopic     Status: Abnormal   Collection Time: 05/29/17  1:00 PM  Result Value Ref Range   Color, Urine RED (A) YELLOW   APPearance CLOUDY (A) CLEAR   Specific Gravity, Urine 1.029 1.005 - 1.030   pH 7.0 5.0 - 8.0   Glucose, UA 50 (A) NEGATIVE mg/dL   Hgb urine dipstick MODERATE (A) NEGATIVE   Bilirubin Urine NEGATIVE NEGATIVE   Ketones, ur 20 (A) NEGATIVE mg/dL   Protein, ur 100 (A) NEGATIVE mg/dL   Nitrite NEGATIVE NEGATIVE   Leukocytes, UA TRACE (A) NEGATIVE  Urinalysis, Microscopic (reflex)     Status: Abnormal   Collection Time: 05/29/17  1:00 PM  Result Value Ref Range   RBC / HPF TOO NUMEROUS TO COUNT 0 - 5 RBC/hpf   WBC, UA TOO NUMEROUS TO COUNT 0 - 5 WBC/hpf   Bacteria, UA MANY (A) NONE SEEN   Squamous Epithelial / LPF 0-5 (A) NONE SEEN  CBC     Status: Abnormal   Collection Time: 05/29/17  4:15 PM  Result Value Ref Range   WBC 9.9 4.0 - 10.5 K/uL   RBC 3.90 3.87 - 5.11 MIL/uL   Hemoglobin 11.0 (L) 12.0 - 15.0 g/dL   HCT 35.7 (L) 36.0 - 46.0 %   MCV 91.5 78.0 - 100.0 fL   MCH 28.2 26.0 - 34.0 pg   MCHC 30.8 30.0 - 36.0 g/dL   RDW 19.8 (H) 11.5 - 15.5 %   Platelets 248 150 - 400 K/uL   Recent Results (from the past 240 hour(s))  MRSA PCR Screening     Status: None   Collection Time: 05/29/17  2:13 AM  Result Value Ref Range Status   MRSA by PCR NEGATIVE NEGATIVE Final    Comment:        The GeneXpert MRSA Assay (FDA approved for NASAL specimens only), is one component of a comprehensive MRSA colonization surveillance program. It is not intended to diagnose MRSA infection nor to guide or monitor treatment for MRSA infections.     Renal Function: Recent Labs    05/30/2017 1815  CREATININE 3.56*   Estimated Creatinine Clearance: 10.3 mL/min (A) (by C-G formula based on SCr of  3.56 mg/dL (H)).  Radiologic Imaging: Ct Abdomen Pelvis Wo Contrast  Result Date: 05/15/2017 CLINICAL DATA:  Intermittent blood in stool. History of endometrial carcinoma. Chronic renal failure EXAM: CT ABDOMEN AND PELVIS WITHOUT CONTRAST TECHNIQUE: Multidetector CT imaging of the abdomen and pelvis was performed following the standard protocol without IV contrast. Oral contrast was administered. COMPARISON:  November 13, 2016 FINDINGS: Lower chest: There is a sizable right pleural effusion with consolidation in the right lower lobe. There is a minimal left pleural effusion. There are multiple foci of coronary artery calcification. Hepatobiliary: No focal liver lesions are appreciable on this noncontrast enhanced study. There are gallstones within the gallbladder. The gallbladder wall does thickened. There is no biliary duct dilatation. Pancreas: There is no pancreatic mass or inflammatory focus. Spleen: No splenic lesions are evident. Adrenals/Urinary Tract: Adrenals bilaterally appear unremarkable. There is moderate perinephric stranding and fluid surrounding both kidneys. There is a 1.2 x 1.0 cm cyst arising from the anterior mid left kidney. There is no appreciable hydronephrosis on either side. There is no renal or ureteral calculus on either side. There is diffuse urinary bladder wall thickening with extensive air in the bladder. Note that a fat plane between the sigmoid colon in the urinary bladder cannot be ascertained. This appearance raises concern for enterovesical fistula. Stomach/Bowel: There is wall thickening throughout the sigmoid colon. There are multiple diverticula without frank diverticulitis. Note that the sigmoid colon cannot be separated from the urinary bladder, raising concern for potential enterovesical fistula. There is a sizable ventral hernia. Loops of small bowel extends into this hernia without bowel compromise. A small amount of ascites is present in this area. Elsewhere, there is no  appreciable bowel wall or mesenteric thickening. There are diverticula in the descending colon without diverticulitis. No evident bowel obstruction. No free air or portal venous air. Vascular/Lymphatic: There is extensive aortoiliac atherosclerosis. There appears to be hemodynamically significant obstruction in the proximal iliac arteries bilaterally, more severe on the right than on the left. There is calcification in multiple pelvic arterial vessels. There is also calcification in the mesenteric vessels. There appears to be high-grade stenosis in all the proximal major mesenteric vessels without frank obstruction. There is extensive calcification in the hepatic artery extending into the proximal liver. No adenopathy is appreciable in the abdomen or pelvis. Reproductive: Uterus absent.  No well-defined pelvic mass evident. Other: Appendix absent. There is mild ascites in the abdomen, primarily adjacent to the liver and within the ventral hernia repair no abscess is seen in the abdomen or pelvis. There is a degree of abdominal wall anasarca. Musculoskeletal: There is extensive degenerative change in the lumbar spine. There are no blastic or lytic bone lesions. No intramuscular lesion evident. IMPRESSION: 1. A fat plane cannot be established between the sigmoid colon and the urinary bladder. There is air in the urinary bladder. Question enterovesical fistula. 2. Sizable ventral hernia containing loops of small bowel without bowel compromise. No bowel obstruction evident. 3. Left-sided colonic diverticulosis. Wall thickening in the sigmoid colon is likely due to muscular hypertrophy from chronic diverticulosis. No frank diverticulitis appreciable. 4. Sizable right pleural effusion with right base consolidation. Minimal left pleural effusion. 5.  There is a degree of anasarca in the abdominal wall. 6. Extensive aortic atherosclerosis. There is hemodynamically significant obstruction due to calcification in both common  iliac arteries. There is extensive mesenteric arterial atherosclerosis with what appears to be high-grade stenosis in the major mesenteric arteries. There is no frank bowel ischemia evident on this study. There  is also extensive coronary artery calcification. 7.  Cholelithiasis. 8.  Mild ascites. 9.  Uterus absent.  Appendix absent. Aortic Atherosclerosis (ICD10-I70.0). Electronically Signed   By: Lowella Grip III M.D.   On: 05/16/2017 22:35    I independently reviewed the above imaging studies.  Impression/Recommendation: 1) Probable colovesical fistula:  Her imaging findings, recent note of stool in urine with catheterization, and history of radiation to the pelvis is highly suspicious for colovesical fistula.  She is not acutely ill and does not require acute intervention per General Surgery.  Although one option would be to place an indwelling catheter to optimize decompression of the bladder, there is not an absolute indication for this considering she appears asymptomatic.  She is apparently being scheduled for follow up as an outpatient with one of the colorectal surgeons with [facility] and she can follow up as an outpatient with me as well for cystoscopy to make a definitive diagnosis and assess the anatomy of her apparent fistula.    However, she is not likely to be a candidate for any definitive surgical treatment of a fistula considering her frailty, DNR status, and multiple medical comorbidities that would likely result in surgery to be with significant risk.  She may possibly benefit from diverting colostomy and urinary tract drainage with Foley catheter or possibly even diverting nephrostomy tubes if necessary should she develop symptoms or complications from her presumed fistula. I am not sure there is benefit from antibiotic therapy in the absence of symptoms and if she is not going to be a candidate for definitive surgery in the future.   I have discussed her situation in detail with her  son, Jasmane Brockway, tonight over the phone.  He expressed his understanding and all questions were answered to his stated satisfaction.  Will arrange outpatient follow up in a few weeks and will discuss further with colorectal surgery as an outpatient.  Dorene Bruni,LES 05/29/2017, 9:23 PM  Pryor Curia. MD   CC: Dr. Stark Klein

## 2017-05-29 NOTE — Progress Notes (Signed)
Patient ID: Debbie [name], female   DOB: [birth year], 83 y.o.   MRN: [id number]  [facility] Surgery Progress Note     Subjective: CC-  Patient sitting up in bed eating breakfast. No complaints. Denies abdominal pain, nausea, or vomiting. Denies dysuria or noticing change in urine appearance. No BM since admission.   Objective: Vital signs in last 24 hours: Temp:  [97.8 F (36.6 C)-98.3 F (36.8 C)] 98 F (36.7 C) (12/21 0827) Pulse Rate:  [65-83] 74 (12/21 0827) Resp:  [18-23] 18 (12/21 0827) BP: (85-127)/(44-82) 90/44 (12/21 0827) SpO2:  [92 %-99 %] 95 % (12/21 0827) Weight:  [138 lb 7.2 oz (62.8 kg)-152 lb (68.9 kg)] 138 lb 7.2 oz (62.8 kg) (12/21 0221) Last BM Date: 05/29/17  Intake/Output from previous day: No intake/output data recorded. Intake/Output this shift: No intake/output data recorded.  PE: Gen:  Alert, NAD, pleasant HEENT: EOM's intact, pupils equal and round Card:  RRR, no M/G/R heard Pulm:  CTAB, no W/R/R, effort normal Abd: Soft, NT/ND, +BS, no HSM, ventral hernia left lower abdomen soft  Ext:  Calves soft and nontender Skin: no rashes noted, warm and dry  Lab Results:  Recent Labs    05/23/2017 1815  WBC 13.2*  HGB 11.7*  HCT 38.2  PLT 227   BMET Recent Labs    05/24/2017 1815  NA 137  K 3.3*  CL 94*  CO2 30  GLUCOSE 148*  BUN 35*  CREATININE 3.56*  CALCIUM 8.6*   PT/INR No results for input(s): LABPROT, INR in the last 72 hours. CMP     Component Value Date/Time   NA 137 05/09/2017 1815   NA 143 01/04/2016 0953   K 3.3 (L) 05/27/2017 1815   K 4.1 01/04/2016 0953   CL 94 (L) 05/22/2017 1815   CO2 30 06/05/2017 1815   CO2 25 01/04/2016 0953   GLUCOSE 148 (H) 05/25/2017 1815   GLUCOSE 138 01/04/2016 0953   BUN 35 (H) 05/29/2017 1815   BUN 25.7 01/04/2016 0953   CREATININE 3.56 (H) 06/06/2017 1815   CREATININE 1.9 (H) 01/04/2016 0953   CALCIUM 8.6 (L) 05/17/2017 1815   CALCIUM 9.7 01/04/2016 0953   PROT 6.8 05/17/2017  1815   ALBUMIN 2.8 (L) 05/19/2017 1815   AST 28 05/27/2017 1815   ALT 18 05/21/2017 1815   ALKPHOS 118 05/23/2017 1815   BILITOT 0.7 05/27/2017 1815   GFRNONAA 11 (L) 05/25/2017 1815   GFRAA 13 (L) 05/26/2017 1815   Lipase  No results found for: LIPASE     Studies/Results: Ct Abdomen Pelvis Wo Contrast  Result Date: 05/25/2017 CLINICAL DATA:  Intermittent blood in stool. History of endometrial carcinoma. Chronic renal failure EXAM: CT ABDOMEN AND PELVIS WITHOUT CONTRAST TECHNIQUE: Multidetector CT imaging of the abdomen and pelvis was performed following the standard protocol without IV contrast. Oral contrast was administered. COMPARISON:  November 13, 2016 FINDINGS: Lower chest: There is a sizable right pleural effusion with consolidation in the right lower lobe. There is a minimal left pleural effusion. There are multiple foci of coronary artery calcification. Hepatobiliary: No focal liver lesions are appreciable on this noncontrast enhanced study. There are gallstones within the gallbladder. The gallbladder wall does thickened. There is no biliary duct dilatation. Pancreas: There is no pancreatic mass or inflammatory focus. Spleen: No splenic lesions are evident. Adrenals/Urinary Tract: Adrenals bilaterally appear unremarkable. There is moderate perinephric stranding and fluid surrounding both kidneys. There is a 1.2 x 1.0 cm cyst arising from  the anterior mid left kidney. There is no appreciable hydronephrosis on either side. There is no renal or ureteral calculus on either side. There is diffuse urinary bladder wall thickening with extensive air in the bladder. Note that a fat plane between the sigmoid colon in the urinary bladder cannot be ascertained. This appearance raises concern for enterovesical fistula. Stomach/Bowel: There is wall thickening throughout the sigmoid colon. There are multiple diverticula without frank diverticulitis. Note that the sigmoid colon cannot be separated from the  urinary bladder, raising concern for potential enterovesical fistula. There is a sizable ventral hernia. Loops of small bowel extends into this hernia without bowel compromise. A small amount of ascites is present in this area. Elsewhere, there is no appreciable bowel wall or mesenteric thickening. There are diverticula in the descending colon without diverticulitis. No evident bowel obstruction. No free air or portal venous air. Vascular/Lymphatic: There is extensive aortoiliac atherosclerosis. There appears to be hemodynamically significant obstruction in the proximal iliac arteries bilaterally, more severe on the right than on the left. There is calcification in multiple pelvic arterial vessels. There is also calcification in the mesenteric vessels. There appears to be high-grade stenosis in all the proximal major mesenteric vessels without frank obstruction. There is extensive calcification in the hepatic artery extending into the proximal liver. No adenopathy is appreciable in the abdomen or pelvis. Reproductive: Uterus absent.  No well-defined pelvic mass evident. Other: Appendix absent. There is mild ascites in the abdomen, primarily adjacent to the liver and within the ventral hernia repair no abscess is seen in the abdomen or pelvis. There is a degree of abdominal wall anasarca. Musculoskeletal: There is extensive degenerative change in the lumbar spine. There are no blastic or lytic bone lesions. No intramuscular lesion evident. IMPRESSION: 1. A fat plane cannot be established between the sigmoid colon and the urinary bladder. There is air in the urinary bladder. Question enterovesical fistula. 2. Sizable ventral hernia containing loops of small bowel without bowel compromise. No bowel obstruction evident. 3. Left-sided colonic diverticulosis. Wall thickening in the sigmoid colon is likely due to muscular hypertrophy from chronic diverticulosis. No frank diverticulitis appreciable. 4. Sizable right pleural  effusion with right base consolidation. Minimal left pleural effusion. 5.  There is a degree of anasarca in the abdominal wall. 6. Extensive aortic atherosclerosis. There is hemodynamically significant obstruction due to calcification in both common iliac arteries. There is extensive mesenteric arterial atherosclerosis with what appears to be high-grade stenosis in the major mesenteric arteries. There is no frank bowel ischemia evident on this study. There is also extensive coronary artery calcification. 7.  Cholelithiasis. 8.  Mild ascites. 9.  Uterus absent.  Appendix absent. Aortic Atherosclerosis (ICD10-I70.0). Electronically Signed   By: Lowella Grip III M.D.   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 Afebrile - u/a has not been collected. Start IV zosyn after u/a is collected. Ok to continue diet since patient is having no abdominal pain. I will call  urology for consult.  ID - zosyn 12/21>> FEN - renal diet VTE - SCDs, no chemical DVT prophylaxis due to rectal bleeding Foley - none   LOS: 1 day    Wellington Hampshire , [facility] 05/29/2017, 9:44 AM Pager: [phone] Consults: [phone] Mon-Fri 7:00 am-4:30 pm Sat-Sun 7:00 am-11:30 am

## 2017-05-29 NOTE — Consult Note (Signed)
Reason for Consult:  Colovesical fistula Referring Physician: Zenia Resides, MD  Debbie [name] is an 83 y.o. female.  HPI:  Patient is an 83 year old female who presented to the emergency department today with blood from her stools.  She was admitted in August with hematochezia. Flexible sigmoidoscopy was performed by Dr. Carlean Purl and she was seen to have multiple large mouth diverticula in the sigmoid. There was a significant sigmoid stricture also presumed to be from diverticulitis that was not traversed.  The patient is not able to give me any of this history. She says that she feels fine and she doesn't recall any bloody bowel movements.  She denies any dysuria or change in her urinary frequency. She denies any abnormal colored urine. She denies dizziness, fever, chills, sweats. She denies abdominal pain or change in bowel habits. She is a poor historian.   The patient was found to have an elevated white blood cell count and a CT was performed. This was concerning for a colovesical fistula. A Foley catheter was placed and appeared to show diarrhea coming from her bladder.  She lives at [facility] assisted living.  She has a significant history for congestive heart failure, chronic kidney disease on hemodialysis, COPD, diabetes and hypertension.  she also has a history of endometrial cancer, cataracts, and dementia. She also has paroxysmal atrial fibrillation.   Past Medical History:  Diagnosis Date  . Allergy   . Anxiety   . Arthritis   . Cancer ([place]) dx'd 12/2007   endometroid adenocarcinoma  . Cataract    both  . CHF (congestive heart failure) ([place])   . Chronic kidney disease   . COPD (chronic obstructive pulmonary disease) ([place])   . Dementia   . Diabetes mellitus    Type II  . Dizziness   . Dyspnea   . Dysrhythmia    Paroxysmal Afib  . History of radiation therapy 9/21,9/28,10/05,05/20/2008   4 txs 2400 cGy endometrial adenocarcinoma  . History of radiation therapy  02/12/16-03/19/16,  04/09/16, 04/15/16, 04/22/16   Pelvis/ 50 Gy in 25 fractions and HDR vagina / 18 Gy in 3 fractions  . Hypertension   . Hypotension   . Peripheral vascular disease ([place])   . Pneumonia   . Rectal bleeding 01/2017  . Seasonal allergies   . Secondary malignant neoplasm of vagina ([place])   . UTI (urinary tract infection) 05/2016    Past Surgical History:  Procedure Laterality Date  . A/V FISTULAGRAM Right 01/13/2017   Procedure: A/V Fistulagram - Right Arm;  Surgeon: Serafina Mitchell, MD;  Location: [place] [facility];  Service: Cardiovascular;  Laterality: Right;  . ABDOMINAL HYSTERECTOMY    . APPENDECTOMY    . AV FISTULA PLACEMENT Left 07/17/2016   Procedure: LEFT ARM ARTERIOVENOUS (AV) FISTULA CREATION;  Surgeon: Angelia Mould, MD;  Location: [place];  Service: Vascular;  Laterality: Left;  . ENDARTERECTOMY Left 01/12/2014   Procedure: LEFT CAROTID ARTERY ENDARTERECTOMY WITH HEMASHIELD PATCH ANGIOPLASTY;  Surgeon: Mal Misty, MD;  Location: [place];  Service: Vascular;  Laterality: Left;  . ENDARTERECTOMY Right 03/17/2014   Procedure: RIGHT CAROTD ARTERY ENDARTERECTOMY WITH DACRON PATCH ANGIOPLASTY;  Surgeon: Mal Misty, MD;  Location: [place];  Service: Vascular;  Laterality: Right;  . EYE SURGERY Bilateral    cataracts  . FISTULA SUPERFICIALIZATION Right 01/29/2017   Procedure: FISTULA SUPERFICIALIZATION RIGHT ARM WITH BRANCH LIGATION;  Surgeon: Serafina Mitchell, MD;  Location: [place];  Service: Vascular;  Laterality: Right;  .  FLEXIBLE SIGMOIDOSCOPY N/A 01/15/2017   Procedure: FLEXIBLE SIGMOIDOSCOPY;  Surgeon: Gatha Mayer, MD;  Location: [facility] ENDOS[facility];  Service: Endoscopy;  Laterality: N/A;  . INSERTION OF DIALYSIS CATHETER Right 07/17/2016   Procedure: INSERTION OF DIALYSIS CATHETER;  Surgeon: Angelia Mould, MD;  Location: [place];  Service: Vascular;  Laterality: Right;  . NASAL SEPTUM SURGERY    . NECK SURGERY Bilateral    arterial  . TONSILLECTOMY       Family History  Problem Relation Age of Onset  . Actinic keratosis Mother   . Cancer Mother        uterine  . Heart disease Father   . Hyperlipidemia Father   . Hypertension Father   . Stroke Father   . Cancer Sister        lung    Social History:  reports that  has never smoked. she has never used smokeless tobacco. She reports that she does not drink alcohol or use drugs.  Allergies:  Allergies  Allergen Reactions  . Codeine Other (See Comments)    Unusual mouth sensation   Current Meds  Medication Sig  . Albuterol Sulfate (PROAIR RESPICLICK) 956 (90 Base) MCG/ACT AEPB Inhale 1-2 puffs into the lungs every 4 (four) hours as needed. (Patient taking differently: Inhale 1-2 puffs into the lungs every 4 (four) hours as needed (for shortness of breath). )  . Amino Acids-Protein Hydrolys (FEEDING SUPPLEMENT, PRO-STAT SUGAR FREE 64,) LIQD Take 30 mLs by mouth 2 (two) times daily.  [name] atorvastatin (LIPITOR) 20 MG tablet Take 20 mg by mouth every evening. (1700)  . B Complex-C-Folic Acid (NEPHRO-VITE PO) Take 1 tablet by mouth daily.   [name] donepezil (ARICEPT) 5 MG tablet Take 5 mg by mouth at bedtime.   [name] guaiFENesin (MUCINEX) 600 MG 12 hr tablet Take 1 tablet (600 mg total) by mouth 2 (two) times daily as needed. (Patient taking differently: Take 600 mg by mouth 2 (two) times daily as needed for cough (congestion). )  . midodrine (PROAMATINE) 10 MG tablet Take 1 tablet (10 mg total) by mouth 2 (two) times daily with a meal.  . oxyCODONE-acetaminophen (PERCOCET/ROXICET) 5-325 MG tablet Take 0.5-1 tablets by mouth every 6 (six) hours as needed.  [name] PROCTO-MED HC 2.5 % rectal cream Place 1 application rectally 2 (two) times daily.  [name] senna-docusate (SENOKOT-S) 8.6-50 MG tablet Take 2 tablets by mouth 2 (two) times daily.     Results for orders placed or performed during the hospital encounter of 05/27/2017 (from the past 48 hour(s))  POC occult blood, ED     Status: None   Collection  Time: 05/10/2017  5:36 PM  Result Value Ref Range   Fecal Occult Bld NEGATIVE NEGATIVE  Comprehensive metabolic panel     Status: Abnormal   Collection Time: 05/10/2017  6:15 PM  Result Value Ref Range   Sodium 137 135 - 145 mmol/L   Potassium 3.3 (L) 3.5 - 5.1 mmol/L   Chloride 94 (L) 101 - 111 mmol/L   CO2 30 22 - 32 mmol/L   Glucose, Bld 148 (H) 65 - 99 mg/dL   BUN 35 (H) 6 - 20 mg/dL   Creatinine, Ser 3.56 (H) 0.44 - 1.00 mg/dL   Calcium 8.6 (L) 8.9 - 10.3 mg/dL   Total Protein 6.8 6.5 - 8.1 g/dL   Albumin 2.8 (L) 3.5 - 5.0 g/dL   AST 28 15 - 41 U/L   ALT 18 14 - 54 U/L  Alkaline Phosphatase 118 38 - 126 U/L   Total Bilirubin 0.7 0.3 - 1.2 mg/dL   GFR calc non Af Amer 11 (L) >60 mL/min   GFR calc Af Amer 13 (L) >60 mL/min    Comment: (NOTE) The eGFR has been calculated using the CKD EPI equation. This calculation has not been validated in all clinical situations. eGFR's persistently <60 mL/min signify possible Chronic Kidney Disease.    Anion gap 13 5 - 15  CBC     Status: Abnormal   Collection Time: 05/10/2017  6:15 PM  Result Value Ref Range   WBC 13.2 (H) 4.0 - 10.5 K/uL   RBC 4.11 3.87 - 5.11 MIL/uL   Hemoglobin 11.7 (L) 12.0 - 15.0 g/dL   HCT 38.2 36.0 - 46.0 %   MCV 92.9 78.0 - 100.0 fL   MCH 28.5 26.0 - 34.0 pg   MCHC 30.6 30.0 - 36.0 g/dL   RDW 19.4 (H) 11.5 - 15.5 %   Platelets 227 150 - 400 K/uL    Ct Abdomen Pelvis Wo Contrast  Result Date: 06/08/2017 CLINICAL DATA:  Intermittent blood in stool. History of endometrial carcinoma. Chronic renal failure EXAM: CT ABDOMEN AND PELVIS WITHOUT CONTRAST TECHNIQUE: Multidetector CT imaging of the abdomen and pelvis was performed following the standard protocol without IV contrast. Oral contrast was administered. COMPARISON:  November 13, 2016 FINDINGS: Lower chest: There is a sizable right pleural effusion with consolidation in the right lower lobe. There is a minimal left pleural effusion. There are multiple foci of coronary  artery calcification. Hepatobiliary: No focal liver lesions are appreciable on this noncontrast enhanced study. There are gallstones within the gallbladder. The gallbladder wall does thickened. There is no biliary duct dilatation. Pancreas: There is no pancreatic mass or inflammatory focus. Spleen: No splenic lesions are evident. Adrenals/Urinary Tract: Adrenals bilaterally appear unremarkable. There is moderate perinephric stranding and fluid surrounding both kidneys. There is a 1.2 x 1.0 cm cyst arising from the anterior mid left kidney. There is no appreciable hydronephrosis on either side. There is no renal or ureteral calculus on either side. There is diffuse urinary bladder wall thickening with extensive air in the bladder. Note that a fat plane between the sigmoid colon in the urinary bladder cannot be ascertained. This appearance raises concern for enterovesical fistula. Stomach/Bowel: There is wall thickening throughout the sigmoid colon. There are multiple diverticula without frank diverticulitis. Note that the sigmoid colon cannot be separated from the urinary bladder, raising concern for potential enterovesical fistula. There is a sizable ventral hernia. Loops of small bowel extends into this hernia without bowel compromise. A small amount of ascites is present in this area. Elsewhere, there is no appreciable bowel wall or mesenteric thickening. There are diverticula in the descending colon without diverticulitis. No evident bowel obstruction. No free air or portal venous air. Vascular/Lymphatic: There is extensive aortoiliac atherosclerosis. There appears to be hemodynamically significant obstruction in the proximal iliac arteries bilaterally, more severe on the right than on the left. There is calcification in multiple pelvic arterial vessels. There is also calcification in the mesenteric vessels. There appears to be high-grade stenosis in all the proximal major mesenteric vessels without frank  obstruction. There is extensive calcification in the hepatic artery extending into the proximal liver. No adenopathy is appreciable in the abdomen or pelvis. Reproductive: Uterus absent.  No well-defined pelvic mass evident. Other: Appendix absent. There is mild ascites in the abdomen, primarily adjacent to the liver and within the ventral  hernia repair no abscess is seen in the abdomen or pelvis. There is a degree of abdominal wall anasarca. Musculoskeletal: There is extensive degenerative change in the lumbar spine. There are no blastic or lytic bone lesions. No intramuscular lesion evident. IMPRESSION: 1. A fat plane cannot be established between the sigmoid colon and the urinary bladder. There is air in the urinary bladder. Question enterovesical fistula. 2. Sizable ventral hernia containing loops of small bowel without bowel compromise. No bowel obstruction evident. 3. Left-sided colonic diverticulosis. Wall thickening in the sigmoid colon is likely due to muscular hypertrophy from chronic diverticulosis. No frank diverticulitis appreciable. 4. Sizable right pleural effusion with right base consolidation. Minimal left pleural effusion. 5.  There is a degree of anasarca in the abdominal wall. 6. Extensive aortic atherosclerosis. There is hemodynamically significant obstruction due to calcification in both common iliac arteries. There is extensive mesenteric arterial atherosclerosis with what appears to be high-grade stenosis in the major mesenteric arteries. There is no frank bowel ischemia evident on this study. There is also extensive coronary artery calcification. 7.  Cholelithiasis. 8.  Mild ascites. 9.  Uterus absent.  Appendix absent. Aortic Atherosclerosis (ICD10-I70.0). Electronically Signed   By: Lowella Grip III M.D.   On: 05/29/2017 22:35    Review of Systems  Constitutional: Negative.   HENT: Negative.   Eyes: Negative.   Respiratory: Negative.   Cardiovascular: Negative.    Gastrointestinal: Positive for blood in stool.  Genitourinary: Negative.   Musculoskeletal: Negative.   Skin: Negative.   Neurological: Negative.   Endo/Heme/Allergies: Negative.   Psychiatric/Behavioral: Negative.   All other systems reviewed and are negative.  Blood pressure 102/67, pulse 65, temperature 97.8 F (36.6 C), temperature source Oral, resp. rate 18, height 5' 2"  (1.575 m), weight 68.9 kg (152 lb), SpO2 92 %. Physical Exam  Constitutional: She appears well-developed and well-nourished. No distress.  HENT:  Head: Normocephalic and atraumatic.  Eyes: Conjunctivae are normal. Pupils are equal, round, and reactive to light. Right eye exhibits no discharge. Left eye exhibits no discharge. No scleral icterus.  Neck: No thyromegaly present.  Cardiovascular: Normal rate and intact distal pulses.  Respiratory: Effort normal. No respiratory distress.  GI: Soft. She exhibits no distension. There is no tenderness. There is no rebound and no guarding.  Musculoskeletal: She exhibits no edema.  Lymphadenopathy:    She has no cervical adenopathy.  Neurological: She is alert. Coordination normal.  Skin: Skin is warm and dry. No rash noted. She is not diaphoretic. No erythema. No pallor.  Psychiatric: She has a normal mood and affect. Her behavior is normal.  Does not seem confused, just not clear on what is going on.  She "feels fine."    Assessment/Plan:  Colovesical fistula  Await urinalysis. There was air in the bladder prior to instrumentation and appearance of fecal material which is certainly probable for fistula. This is likely secondary to long-standing diverticulitis.   She may not be a candidate for aggressive surgical therapy. She has significant medical comorbidities and is DNR/DNI with mild dementia. She does appear to be fairly functional.  I recommend urology consult as well. This may be able to be treated with suppressive antibiotic therapy. If not, the least  aggressive treatment would be diverting colostomy. Depending on how she looks and the trend in her white blood cell count and temperature, this may be able to be further worked up as an outpatient. I would start the workup as an inpatient given her recent history  of diverticular bleed and leukocytosis. I suspect she has a polymicrobial urinary tract infection and this is likely the cause of her elevated white blood cell count. It is surprising to me that she has not noticed a change in her urine appearance. Sounds like she is going to get transferred to [facility] given her history of renal failure and dialysis.  Stark Klein 05/29/2017, 1:00 AM

## 2017-05-30 DIAGNOSIS — L988 Other specified disorders of the skin and subcutaneous tissue: Secondary | ICD-10-CM

## 2017-05-30 DIAGNOSIS — J90 Pleural effusion, not elsewhere classified: Secondary | ICD-10-CM

## 2017-05-30 LAB — CBC
HCT: 37.3 % (ref 36.0–46.0)
Hemoglobin: 11.7 g/dL — ABNORMAL LOW (ref 12.0–15.0)
MCH: 28.4 pg (ref 26.0–34.0)
MCHC: 31.4 g/dL (ref 30.0–36.0)
MCV: 90.5 fL (ref 78.0–100.0)
PLATELETS: 277 10*3/uL (ref 150–400)
RBC: 4.12 MIL/uL (ref 3.87–5.11)
RDW: 19.4 % — ABNORMAL HIGH (ref 11.5–15.5)
WBC: 3.3 10*3/uL — ABNORMAL LOW (ref 4.0–10.5)

## 2017-05-30 LAB — GLUCOSE, CAPILLARY: GLUCOSE-CAPILLARY: 124 mg/dL — AB (ref 65–99)

## 2017-05-30 LAB — RENAL FUNCTION PANEL
ALBUMIN: 2.6 g/dL — AB (ref 3.5–5.0)
ANION GAP: 17 — AB (ref 5–15)
BUN: 52 mg/dL — ABNORMAL HIGH (ref 6–20)
CALCIUM: 8.5 mg/dL — AB (ref 8.9–10.3)
CO2: 25 mmol/L (ref 22–32)
Chloride: 91 mmol/L — ABNORMAL LOW (ref 101–111)
Creatinine, Ser: 5.17 mg/dL — ABNORMAL HIGH (ref 0.44–1.00)
GFR, EST AFRICAN AMERICAN: 8 mL/min — AB (ref >60)
GFR, EST NON AFRICAN AMERICAN: 7 mL/min — AB (ref >60)
GLUCOSE: 180 mg/dL — AB (ref 65–99)
PHOSPHORUS: 5.5 mg/dL — AB (ref 2.5–4.6)
POTASSIUM: 3.4 mmol/L — AB (ref 3.5–5.1)
Sodium: 133 mmol/L — ABNORMAL LOW (ref 135–145)

## 2017-05-30 MED ORDER — SODIUM CHLORIDE 0.9 % IV SOLN: 100.0000 mL | INTRAVENOUS | Status: DC | PRN

## 2017-05-30 MED ORDER — LIDOCAINE HCL (PF) 1 % IJ SOLN: 5.0000 mL | INTRAMUSCULAR | Status: DC | PRN

## 2017-05-30 MED ORDER — MIDODRINE HCL 5 MG PO TABS
ORAL_TABLET | ORAL | Status: AC
  Administered 2017-05-30: 10 mg via ORAL
  Filled 2017-05-30: qty 2

## 2017-05-30 MED ORDER — ALTEPLASE 2 MG IJ SOLR: 2.0000 mg | Freq: Once | INTRAMUSCULAR | Status: DC | PRN

## 2017-05-30 MED ORDER — PENTAFLUOROPROP-TETRAFLUOROETH EX AERO: 1.0000 "application " | INHALATION_SPRAY | CUTANEOUS | Status: DC | PRN

## 2017-05-30 MED ORDER — PROMETHAZINE HCL 25 MG/ML IJ SOLN
12.5000 mg | Freq: Four times a day (QID) | INTRAMUSCULAR | Status: DC | PRN
  Administered 2017-05-30 – 2017-05-31 (×3): 12.5 mg via INTRAVENOUS
  Filled 2017-05-30 (×3): qty 1

## 2017-05-30 MED ORDER — LIDOCAINE-PRILOCAINE 2.5-2.5 % EX CREA: 1.0000 "application " | TOPICAL_CREAM | CUTANEOUS | Status: DC | PRN

## 2017-05-30 MED ORDER — HEPARIN SODIUM (PORCINE) 1000 UNIT/ML DIALYSIS: 1000.0000 [IU] | INTRAMUSCULAR | Status: DC | PRN

## 2017-05-30 MED ORDER — HEPARIN SODIUM (PORCINE) 1000 UNIT/ML DIALYSIS: 20.0000 [IU]/kg | INTRAMUSCULAR | Status: DC | PRN

## 2017-05-30 NOTE — Progress Notes (Signed)
Pt refusing to wear heart monitor at this time.

## 2017-05-30 NOTE — Procedures (Signed)
I was present at this dialysis session. I have reviewed the session itself and made appropriate changes.   Urology note reviewed  4K bath, TDC Qb 350.  Pt stable throughout Tx. No sig UF.    LIkely can dc today with outpt f/u of fistula. No inpatient renal issues. Next HD is tomorrow 12/23, holiday schedule. HD if here.   Filed Weights   05/29/17 0221 05/29/17 2231 05/30/17 0245  Weight: 62.8 kg (138 lb 7.2 oz) 62.8 kg (138 lb 7.3 oz) 62.3 kg (137 lb 5.6 oz)    Recent Labs  Lab 05/30/17 0312  NA 133*  K 3.4*  CL 91*  CO2 25  GLUCOSE 180*  BUN 52*  CREATININE 5.17*  CALCIUM 8.5*  PHOS 5.5*    Recent Labs  Lab 05/09/2017 1815 05/29/17 1615 05/30/17 0312  WBC 13.2* 9.9 3.3*  HGB 11.7* 11.0* 11.7*  HCT 38.2 35.7* 37.3  MCV 92.9 91.5 90.5  PLT 227 248 277    Scheduled Meds: . atorvastatin  20 mg Oral QPM  . donepezil  5 mg Oral QHS  . feeding supplement (PRO-STAT SUGAR FREE 64)  30 mL Oral BID  . hydrocortisone  1 application Rectal BID  . midodrine  10 mg Oral BID WC  . senna-docusate  2 tablet Oral BID   Continuous Infusions: . sodium chloride    . sodium chloride    . piperacillin-tazobactam (ZOSYN)  IV 3.375 g (05/29/17 2316)   PRN Meds:.sodium chloride, sodium chloride, acetaminophen **OR** acetaminophen, alteplase, bisacodyl, guaiFENesin, heparin, heparin, HYDROcodone-acetaminophen, lidocaine (PF), lidocaine-prilocaine, ondansetron **OR** ondansetron (ZOFRAN) IV, oxyCODONE-acetaminophen, pentafluoroprop-tetrafluoroeth, senna-docusate   Pearson Grippe  MD 05/30/2017, 6:43 AM

## 2017-05-30 NOTE — Progress Notes (Signed)
Subjective/Chief Complaint: Pt with some n/v this AM Tol some PO last night Some abd pain   Objective: Vital signs in last 24 hours: Temp:  [98 F (36.7 C)-98.6 F (37 C)] 98.2 F (36.8 C) (12/22 0922) Pulse Rate:  [65-109] 95 (12/22 0922) Resp:  [18] 18 (12/22 0922) BP: (86-125)/(37-96) 93/55 (12/22 0922) SpO2:  [93 %-96 %] 96 % (12/22 0922) Weight:  [62.3 kg (137 lb 5.6 oz)-62.8 kg (138 lb 7.3 oz)] 62.3 kg (137 lb 5.6 oz) (12/22 0705) Last BM Date: 05/29/17  Intake/Output from previous day: 12/21 0701 - 12/22 0700 In: 410 [P.O.:360; IV Piggyback:50] Out: -  Intake/Output this shift: Total I/O In: 60 [P.O.:60] Out: -288    Constitutional: No acute distress, conversant, appears states age. Eyes: Anicteric sclerae, moist conjunctiva, no lid lag Lungs: Clear to auscultation bilaterally, normal respiratory effort CV: regular rate and rhythm, no murmurs, no peripheral edema, pedal pulses 2+ GI: Soft, no masses or hepatosplenomegaly, lower incisional midline hernia reducible, 3-4cm Skin: No rashes, palpation reveals normal turgor Psychiatric: appropriate judgment and insight, oriented to person, place, and time    Lab Results:  Recent Labs    05/29/17 1615 05/30/17 0312  WBC 9.9 3.3*  HGB 11.0* 11.7*  HCT 35.7* 37.3  PLT 248 277   BMET Recent Labs    05/29/2017 1815 05/30/17 0312  NA 137 133*  K 3.3* 3.4*  CL 94* 91*  CO2 30 25  GLUCOSE 148* 180*  BUN 35* 52*  CREATININE 3.56* 5.17*  CALCIUM 8.6* 8.5*   PT/INR No results for input(s): LABPROT, INR in the last 72 hours. ABG No results for input(s): PHART, HCO3 in the last 72 hours.  Invalid input(s): PCO2, PO2  Studies/Results: Ct Abdomen Pelvis Wo Contrast  Result Date: 05/21/2017 CLINICAL DATA:  Intermittent blood in stool. History of endometrial carcinoma. Chronic renal failure EXAM: CT ABDOMEN AND PELVIS WITHOUT CONTRAST TECHNIQUE: Multidetector CT imaging of the abdomen and pelvis was  performed following the standard protocol without IV contrast. Oral contrast was administered. COMPARISON:  November 13, 2016 FINDINGS: Lower chest: There is a sizable right pleural effusion with consolidation in the right lower lobe. There is a minimal left pleural effusion. There are multiple foci of coronary artery calcification. Hepatobiliary: No focal liver lesions are appreciable on this noncontrast enhanced study. There are gallstones within the gallbladder. The gallbladder wall does thickened. There is no biliary duct dilatation. Pancreas: There is no pancreatic mass or inflammatory focus. Spleen: No splenic lesions are evident. Adrenals/Urinary Tract: Adrenals bilaterally appear unremarkable. There is moderate perinephric stranding and fluid surrounding both kidneys. There is a 1.2 x 1.0 cm cyst arising from the anterior mid left kidney. There is no appreciable hydronephrosis on either side. There is no renal or ureteral calculus on either side. There is diffuse urinary bladder wall thickening with extensive air in the bladder. Note that a fat plane between the sigmoid colon in the urinary bladder cannot be ascertained. This appearance raises concern for enterovesical fistula. Stomach/Bowel: There is wall thickening throughout the sigmoid colon. There are multiple diverticula without frank diverticulitis. Note that the sigmoid colon cannot be separated from the urinary bladder, raising concern for potential enterovesical fistula. There is a sizable ventral hernia. Loops of small bowel extends into this hernia without bowel compromise. A small amount of ascites is present in this area. Elsewhere, there is no appreciable bowel wall or mesenteric thickening. There are diverticula in the descending colon without diverticulitis. No evident  bowel obstruction. No free air or portal venous air. Vascular/Lymphatic: There is extensive aortoiliac atherosclerosis. There appears to be hemodynamically significant obstruction  in the proximal iliac arteries bilaterally, more severe on the right than on the left. There is calcification in multiple pelvic arterial vessels. There is also calcification in the mesenteric vessels. There appears to be high-grade stenosis in all the proximal major mesenteric vessels without frank obstruction. There is extensive calcification in the hepatic artery extending into the proximal liver. No adenopathy is appreciable in the abdomen or pelvis. Reproductive: Uterus absent.  No well-defined pelvic mass evident. Other: Appendix absent. There is mild ascites in the abdomen, primarily adjacent to the liver and within the ventral hernia repair no abscess is seen in the abdomen or pelvis. There is a degree of abdominal wall anasarca. Musculoskeletal: There is extensive degenerative change in the lumbar spine. There are no blastic or lytic bone lesions. No intramuscular lesion evident. IMPRESSION: 1. A fat plane cannot be established between the sigmoid colon and the urinary bladder. There is air in the urinary bladder. Question enterovesical fistula. 2. Sizable ventral hernia containing loops of small bowel without bowel compromise. No bowel obstruction evident. 3. Left-sided colonic diverticulosis. Wall thickening in the sigmoid colon is likely due to muscular hypertrophy from chronic diverticulosis. No frank diverticulitis appreciable. 4. Sizable right pleural effusion with right base consolidation. Minimal left pleural effusion. 5.  There is a degree of anasarca in the abdominal wall. 6. Extensive aortic atherosclerosis. There is hemodynamically significant obstruction due to calcification in both common iliac arteries. There is extensive mesenteric arterial atherosclerosis with what appears to be high-grade stenosis in the major mesenteric arteries. There is no frank bowel ischemia evident on this study. There is also extensive coronary artery calcification. 7.  Cholelithiasis. 8.  Mild ascites. 9.  Uterus  absent.  Appendix absent. Aortic Atherosclerosis (ICD10-I70.0). Electronically Signed   By: Lowella Grip III M.D.   On: 05/18/2017 22:35    Anti-infectives: Anti-infectives (From admission, onward)   Start     Dose/Rate Route Frequency Ordered Stop   05/29/17 1230  piperacillin-tazobactam (ZOSYN) IVPB 3.375 g     3.375 g 12.5 mL/hr over 240 Minutes Intravenous Every 12 hours 05/29/17 1157        Assessment/Plan: 83 y/o F w/ colovesical fistual and incisional hernia Dr. Hulen Skains s/w pt and her son and they are in favor of diverting ostomy.  This would not be until later this week  Pt also with incisional hernia from hysterectomy.  This is likely the cause of her n/v this AM.  It is reducible.  Con't to follow   LOS: 2 days    Rosario Jacks., [facility] 05/30/2017

## 2017-05-30 NOTE — Progress Notes (Signed)
PROGRESS NOTE    Debbie [name]  ZSW:[id number] DOB: 06-20-1930 DOA: 05/09/2017 PCP: Patient, No Pcp Per    Brief Narrative: Debbie [name] is a 83 y.o. female with medical history significant of rectal bleeding likely from diverticular bleed, endometrial cancer status post radiation, ESRD on hemodialysis Monday Wednesday Friday, essential hypertension came to the ER for evaluation of rectal bleed.  Patient resides at [facility] where she was noted to have one episode of rectal bleed earlier today therefore sent to the ER for evaluation.  Patient denies any abdominal pain, nausea, vomiting any other complaints.  She admits that she gets these diverticular rectal bleed frequently and has been evaluated extensively in the past. She was here back in August 2018 for the similar reason and underwent flex sigmoidoscopy which showed likely a diverticular bleed. In the ER today she was hemodynamically stable with hemoglobin around 11 which is her baseline.  Her rectal exam was negative and no blood found in her rectal vault.  Rest of the labs were otherwise mostly unremarkable.  While the nurse was obtaining her urine via straight cath she noted obvious stool coming out of her urethra.  CT of the abdomen pelvis done which showed enterovesical fistula, colonic diverticulosis, sizable right-sided pleural effusion, abdominal wall anasarca and extensive aortic atherosclerosis.  Case was discussed with on-call general surgery, Dr Barry Dienes who recommended admitting the patient and consulting urology in the morning for further care.    Assessment & Plan:   Active Problems:   Rectal bleeding   Rectal bleeding, prior history of diverticular bleeding.  Hb stable. No further bleed reported.   Nausea , vomiting. She was vomiting this am.  Sx following.   Enterovesical fistula;  Sx and urology consulted.  Started on zosyn.  Sx recommend diverting ostomy, next week.  Patient vomiting  today. Zofran PRN  ESRD; HD M, W, F Nephrology consulted.  Had HD 12-21.  Pleural Effusion, right Mild Ascites  hemodialysis today.  Check chest x ray tomorrow.    History of paroxysmal A. fib - Not a candidate for anticoagulation due to rectal bleed.    DVT prophylaxis: SCD.  Code Status: DNR Family Communication: son at bedside.  Disposition Plan: to be determine   Consultants:   Urology  Surgery    Procedures: HD  Antimicrobials: Zosyn   Subjective: She is  Having some abdominal discomfort today.  She was vomiting this am.   Objective: Vitals:   05/30/17 0600 05/30/17 0630 05/30/17 0705 05/30/17 0922  BP: 104/71 112/62 (!) 125/96 (!) 93/55  Pulse: 73 (!) 105 (!) 102 95  Resp:   18 18  Temp:   98.2 F (36.8 C) 98.2 F (36.8 C)  TempSrc:   Oral Oral  SpO2:   95% 96%  Weight:   62.3 kg (137 lb 5.6 oz)   Height:        Intake/Output Summary (Last 24 hours) at 05/30/2017 1327 Last data filed at 05/30/2017 0915 Gross per 24 hour  Intake 300 ml  Output -288 ml  Net 588 ml   Filed Weights   05/29/17 2231 05/30/17 0245 05/30/17 0705  Weight: 62.8 kg (138 lb 7.3 oz) 62.3 kg (137 lb 5.6 oz) 62.3 kg (137 lb 5.6 oz)    Examination:  General exam: NAD Respiratory system: Normal respiratory effort, Crackles bases.  Cardiovascular system: S 1, S 2 RRR Gastrointestinal system: BS present, soft, ventral hernia reducible. No rigidity  Central nervous system: alert ,  oriented.  Extremities: symmetric power.     Data Reviewed: I have personally reviewed following labs and imaging studies  CBC: Recent Labs  Lab 05/25/2017 1815 05/29/17 1615 05/30/17 0312  WBC 13.2* 9.9 3.3*  HGB 11.7* 11.0* 11.7*  HCT 38.2 35.7* 37.3  MCV 92.9 91.5 90.5  PLT 227 248 409   Basic Metabolic Panel: Recent Labs  Lab 05/27/2017 1815 05/30/17 0312  NA 137 133*  K 3.3* 3.4*  CL 94* 91*  CO2 30 25  GLUCOSE 148* 180*  BUN 35* 52*  CREATININE 3.56* 5.17*  CALCIUM  8.6* 8.5*  PHOS  --  5.5*   GFR: Estimated Creatinine Clearance: 7 mL/min (A) (by C-G formula based on SCr of 5.17 mg/dL (H)). Liver Function Tests: Recent Labs  Lab 05/17/2017 1815 05/30/17 0312  AST 28  --   ALT 18  --   ALKPHOS 118  --   BILITOT 0.7  --   PROT 6.8  --   ALBUMIN 2.8* 2.6*   No results for input(s): LIPASE, AMYLASE in the last 168 hours. No results for input(s): AMMONIA in the last 168 hours. Coagulation Profile: No results for input(s): INR, PROTIME in the last 168 hours. Cardiac Enzymes: No results for input(s): CKTOTAL, CKMB, CKMBINDEX, TROPONINI in the last 168 hours. BNP (last 3 results) No results for input(s): PROBNP in the last 8760 hours. HbA1C: No results for input(s): HGBA1C in the last 72 hours. CBG: Recent Labs  Lab 05/29/17 0228 05/30/17 0804  GLUCAP 87 124*   Lipid Profile: No results for input(s): CHOL, HDL, LDLCALC, TRIG, CHOLHDL, LDLDIRECT in the last 72 hours. Thyroid Function Tests: No results for input(s): TSH, T4TOTAL, FREET4, T3FREE, THYROIDAB in the last 72 hours. Anemia Panel: No results for input(s): VITAMINB12, FOLATE, FERRITIN, TIBC, IRON, RETICCTPCT in the last 72 hours. Sepsis Labs: No results for input(s): PROCALCITON, LATICACIDVEN in the last 168 hours.  Recent Results (from the past 240 hour(s))  MRSA PCR Screening     Status: None   Collection Time: 05/29/17  2:13 AM  Result Value Ref Range Status   MRSA by PCR NEGATIVE NEGATIVE Final    Comment:        The GeneXpert MRSA Assay (FDA approved for NASAL specimens only), is one component of a comprehensive MRSA colonization surveillance program. It is not intended to diagnose MRSA infection nor to guide or monitor treatment for MRSA infections.          Radiology Studies: Ct Abdomen Pelvis Wo Contrast  Result Date: 05/31/2017 CLINICAL DATA:  Intermittent blood in stool. History of endometrial carcinoma. Chronic renal failure EXAM: CT ABDOMEN AND PELVIS  WITHOUT CONTRAST TECHNIQUE: Multidetector CT imaging of the abdomen and pelvis was performed following the standard protocol without IV contrast. Oral contrast was administered. COMPARISON:  November 13, 2016 FINDINGS: Lower chest: There is a sizable right pleural effusion with consolidation in the right lower lobe. There is a minimal left pleural effusion. There are multiple foci of coronary artery calcification. Hepatobiliary: No focal liver lesions are appreciable on this noncontrast enhanced study. There are gallstones within the gallbladder. The gallbladder wall does thickened. There is no biliary duct dilatation. Pancreas: There is no pancreatic mass or inflammatory focus. Spleen: No splenic lesions are evident. Adrenals/Urinary Tract: Adrenals bilaterally appear unremarkable. There is moderate perinephric stranding and fluid surrounding both kidneys. There is a 1.2 x 1.0 cm cyst arising from the anterior mid left kidney. There is no appreciable hydronephrosis on either side.  There is no renal or ureteral calculus on either side. There is diffuse urinary bladder wall thickening with extensive air in the bladder. Note that a fat plane between the sigmoid colon in the urinary bladder cannot be ascertained. This appearance raises concern for enterovesical fistula. Stomach/Bowel: There is wall thickening throughout the sigmoid colon. There are multiple diverticula without frank diverticulitis. Note that the sigmoid colon cannot be separated from the urinary bladder, raising concern for potential enterovesical fistula. There is a sizable ventral hernia. Loops of small bowel extends into this hernia without bowel compromise. A small amount of ascites is present in this area. Elsewhere, there is no appreciable bowel wall or mesenteric thickening. There are diverticula in the descending colon without diverticulitis. No evident bowel obstruction. No free air or portal venous air. Vascular/Lymphatic: There is extensive  aortoiliac atherosclerosis. There appears to be hemodynamically significant obstruction in the proximal iliac arteries bilaterally, more severe on the right than on the left. There is calcification in multiple pelvic arterial vessels. There is also calcification in the mesenteric vessels. There appears to be high-grade stenosis in all the proximal major mesenteric vessels without frank obstruction. There is extensive calcification in the hepatic artery extending into the proximal liver. No adenopathy is appreciable in the abdomen or pelvis. Reproductive: Uterus absent.  No well-defined pelvic mass evident. Other: Appendix absent. There is mild ascites in the abdomen, primarily adjacent to the liver and within the ventral hernia repair no abscess is seen in the abdomen or pelvis. There is a degree of abdominal wall anasarca. Musculoskeletal: There is extensive degenerative change in the lumbar spine. There are no blastic or lytic bone lesions. No intramuscular lesion evident. IMPRESSION: 1. A fat plane cannot be established between the sigmoid colon and the urinary bladder. There is air in the urinary bladder. Question enterovesical fistula. 2. Sizable ventral hernia containing loops of small bowel without bowel compromise. No bowel obstruction evident. 3. Left-sided colonic diverticulosis. Wall thickening in the sigmoid colon is likely due to muscular hypertrophy from chronic diverticulosis. No frank diverticulitis appreciable. 4. Sizable right pleural effusion with right base consolidation. Minimal left pleural effusion. 5.  There is a degree of anasarca in the abdominal wall. 6. Extensive aortic atherosclerosis. There is hemodynamically significant obstruction due to calcification in both common iliac arteries. There is extensive mesenteric arterial atherosclerosis with what appears to be high-grade stenosis in the major mesenteric arteries. There is no frank bowel ischemia evident on this study. There is also  extensive coronary artery calcification. 7.  Cholelithiasis. 8.  Mild ascites. 9.  Uterus absent.  Appendix absent. Aortic Atherosclerosis (ICD10-I70.0). Electronically Signed   By: Lowella Grip III M.D.   On: 05/18/2017 22:35        Scheduled Meds: . atorvastatin  20 mg Oral QPM  . donepezil  5 mg Oral QHS  . feeding supplement (PRO-STAT SUGAR FREE 64)  30 mL Oral BID  . hydrocortisone  1 application Rectal BID  . midodrine  10 mg Oral BID WC  . senna-docusate  2 tablet Oral BID   Continuous Infusions: . sodium chloride    . sodium chloride    . piperacillin-tazobactam (ZOSYN)  IV 3.375 g (05/30/17 1000)     LOS: 2 days    Time spent: 35 minutes.     Elmarie Shiley, MD [facility] Pager [phone]  If 7PM-7AM, please contact night-coverage [email or web address] Password [email or web address] 05/30/2017, 1:27 PM

## 2017-05-31 ENCOUNTER — Inpatient Hospital Stay (HOSPITAL_COMMUNITY): Payer: Medicare HMO

## 2017-05-31 DIAGNOSIS — R112 Nausea with vomiting, unspecified: Secondary | ICD-10-CM

## 2017-05-31 LAB — BASIC METABOLIC PANEL
ANION GAP: 30 — AB (ref 5–15)
BUN: 39 mg/dL — ABNORMAL HIGH (ref 6–20)
CALCIUM: 9.1 mg/dL (ref 8.9–10.3)
CO2: 22 mmol/L (ref 22–32)
CREATININE: 4.69 mg/dL — AB (ref 0.44–1.00)
Chloride: 89 mmol/L — ABNORMAL LOW (ref 101–111)
GFR, EST AFRICAN AMERICAN: 9 mL/min — AB (ref >60)
GFR, EST NON AFRICAN AMERICAN: 8 mL/min — AB (ref >60)
GLUCOSE: 88 mg/dL (ref 65–99)
Potassium: 3.9 mmol/L (ref 3.5–5.1)
Sodium: 141 mmol/L (ref 135–145)

## 2017-05-31 LAB — CBC WITH DIFFERENTIAL/PLATELET
BASOS ABS: 0 10*3/uL (ref 0.0–0.1)
BASOS PCT: 0 %
EOS ABS: 0 10*3/uL (ref 0.0–0.7)
Eosinophils Relative: 0 %
HCT: 41.8 % (ref 36.0–46.0)
Hemoglobin: 12.9 g/dL (ref 12.0–15.0)
Lymphocytes Relative: 7 %
Lymphs Abs: 0.9 10*3/uL (ref 0.7–4.0)
MCH: 28.7 pg (ref 26.0–34.0)
MCHC: 30.9 g/dL (ref 30.0–36.0)
MCV: 92.9 fL (ref 78.0–100.0)
MONO ABS: 0.2 10*3/uL (ref 0.1–1.0)
Monocytes Relative: 2 %
NEUTROS ABS: 11.3 10*3/uL — AB (ref 1.7–7.7)
Neutrophils Relative %: 91 %
PLATELETS: 246 10*3/uL (ref 150–400)
RBC: 4.5 MIL/uL (ref 3.87–5.11)
RDW: 20.1 % — AB (ref 11.5–15.5)
WBC Morphology: INCREASED
WBC: 12.4 10*3/uL — ABNORMAL HIGH (ref 4.0–10.5)

## 2017-05-31 LAB — GLUCOSE, CAPILLARY
GLUCOSE-CAPILLARY: 175 mg/dL — AB (ref 65–99)
GLUCOSE-CAPILLARY: 23 mg/dL — AB (ref 65–99)
Glucose-Capillary: 203 mg/dL — ABNORMAL HIGH (ref 65–99)

## 2017-05-31 LAB — LACTIC ACID, PLASMA: LACTIC ACID, VENOUS: 10.8 mmol/L — AB (ref 0.5–1.9)

## 2017-05-31 MED ORDER — DEXTROSE 50 % IV SOLN
INTRAVENOUS | Status: AC
  Filled 2017-05-31: qty 50

## 2017-05-31 MED ORDER — SODIUM CHLORIDE 0.9 % IV BOLUS (SEPSIS)
250.0000 mL | INTRAVENOUS | Status: DC | PRN
  Administered 2017-05-31: 250 mL via INTRAVENOUS

## 2017-05-31 MED ORDER — SODIUM CHLORIDE 0.9 % IV BOLUS (SEPSIS): 1000.0000 mL | Freq: Once | INTRAVENOUS | Status: DC

## 2017-05-31 MED ORDER — MORPHINE SULFATE (PF) 2 MG/ML IV SOLN: 2.0000 mg | INTRAVENOUS | Status: DC | PRN

## 2017-05-31 MED ORDER — SODIUM CHLORIDE 0.9 % IV BOLUS (SEPSIS)
250.0000 mL | Freq: Once | INTRAVENOUS | Status: AC
  Administered 2017-05-31: 250 mL via INTRAVENOUS

## 2017-05-31 MED ORDER — IOPAMIDOL (ISOVUE-300) INJECTION 61%
INTRAVENOUS | Status: AC
  Filled 2017-05-31: qty 30

## 2017-05-31 MED ORDER — METOCLOPRAMIDE HCL 5 MG/ML IJ SOLN
5.0000 mg | Freq: Three times a day (TID) | INTRAMUSCULAR | Status: DC
  Filled 2017-05-31: qty 2

## 2017-05-31 MED ORDER — DIGOXIN 0.25 MG/ML IJ SOLN
0.1250 mg | Freq: Once | INTRAMUSCULAR | Status: AC
  Administered 2017-05-31: 0.125 mg via INTRAVENOUS
  Filled 2017-05-31: qty 0.5

## 2017-05-31 NOTE — Progress Notes (Signed)
Dr.Bodenheimer in to evaluate pt.

## 2017-05-31 NOTE — Progress Notes (Signed)
Paged by Dr. Posey Pronto regarding the patients critical Lactic Acid of 10.5 and BP of 39/14. A 1L NS bolus was ordered and a stat CT Abdomen and Pelvis with contrast was ordered. Upon entering the room the pt was ill appearing, unresponsive on a NRB with pulse ox unable to obtain a reading, and pts BP was still in the 30's manually. Pts CBG was 23 and she was given 2 amps of D50. A post CBG was 203.  Her BP rose to 84/60 and she became minimally responsive, and able to follow simple commands. Spoke with the pts son "Belenda Cruise" and another son as well as grandsons who were present at bedside regarding the patients condition and that the best course of treatment given that she is not a good surgical candidate is to make her comfort care. They were agreeable to this course of treatment. We will keep her comfortable for now and continue to monitor Upon exiting the room the pt was resting comfortably with family at bedside.   Arby Barrette NP-C, AGPCNP-BC [facility] [phone]

## 2017-05-31 NOTE — Progress Notes (Signed)
NG tube was placed and put on low intermittent suction per MD order. Within two minutes of patient being on low suction, gastric drainage of 1000 ml filled the suction canister.Dark bilious gastric drainage noted. MD notified. Will continue to monitor.

## 2017-05-31 NOTE — Progress Notes (Addendum)
PROGRESS NOTE    Debbie [name]  TKW:[id number] DOB: 03/26/1931 DOA: 06/05/2017 PCP: Patient, No Pcp Per    Brief Narrative: Debbie [name] is a 83 y.o. female with medical history significant of rectal bleeding likely from diverticular bleed, endometrial cancer status post radiation, ESRD on hemodialysis Monday Wednesday Friday, essential hypertension came to the ER for evaluation of rectal bleed.  Patient resides at [facility] where she was noted to have one episode of rectal bleed earlier today therefore sent to the ER for evaluation.  Patient denies any abdominal pain, nausea, vomiting any other complaints.  She admits that she gets these diverticular rectal bleed frequently and has been evaluated extensively in the past. She was here back in August 2018 for the similar reason and underwent flex sigmoidoscopy which showed likely a diverticular bleed. In the ER today she was hemodynamically stable with hemoglobin around 11 which is her baseline.  Her rectal exam was negative and no blood found in her rectal vault.  Rest of the labs were otherwise mostly unremarkable.  While the nurse was obtaining her urine via straight cath she noted obvious stool coming out of her urethra.  CT of the abdomen pelvis done which showed enterovesical fistula, colonic diverticulosis, sizable right-sided pleural effusion, abdominal wall anasarca and extensive aortic atherosclerosis.  Case was discussed with on-call general surgery, Dr Barry Dienes who recommended admitting the patient and consulting urology in the morning for further care.    Assessment & Plan:   Active Problems:   Rectal bleeding   Rectal bleeding, prior history of diverticular bleeding.  Hb stable. No further bleed reported.   Nausea , vomiting. Partial SBO on x-ray Frequent recurrent vomiting seen throughout the day today.  Patient was initially given scheduled Reglan along with Phenergan as needed. Since that did not stop  patient some vomiting, currently keeping the patient n.p.o. Insert NG tube. Follow-up on recommendation from surgery tomorrow. Recheck x-ray abdomen tomorrow.  Enterovesical fistula;  Sx and urology consulted.  Started on zosyn.  Sx recommend diverting ostomy, next week.   ESRD; HD M, W, F Nephrology consulted.  Had HD 12-21.  Pleural Effusion, right Mild Ascites  Pleural effusion remains after hemodialysis. Most likely will require thoracentesis, for now working on symptomatically controlling nausea.  History of paroxysmal A. fib - Not a candidate for anticoagulation due to rectal bleed.  Hypotension. Likely from volume removal on HD. 256 and bolus x1 followed by 250 bolus as needed every 30 minutes as long as blood pressure is 90s over 60.  Partial SBO  X ray showed partial SBO, NG tube inserted with prompt removal of 1000 cc of gastric content.  Monitor Check labs.  Addendum: Lactic acid elevated.  Called bedside RN and on call floor coverage. 1Litre NS bolus On Antibiotics  Stat ct abdominal pelvis.   Berle Mull 8:50 PM 05/31/2017    DVT prophylaxis: SCD.  Code Status: DNR Family Communication: son at bedside.  Disposition Plan: to be determine   Consultants:   Urology  Surgery    Procedures: HD  Antimicrobials: Zosyn   Subjective: Multiple episodes of vomiting, no abdominal pain. Was also hypotensive although asymptomatic no dizziness no lightheadedness no focal deficit and actually oriented and nontoxic-appearing.  Objective: Vitals:   05/31/17 0656 05/31/17 0700 05/31/17 0855 05/31/17 0951  BP: (!) 92/44  (!) 58/40 (!) 77/23  Pulse:  (!) 116 (!) 104 (!) 108  Resp:   18   Temp:   98  F (36.7 C)   TempSrc:   Oral   SpO2: 97%  94%   Weight:      Height:        Intake/Output Summary (Last 24 hours) at 05/31/2017 1634 Last data filed at 05/31/2017 1303 Gross per 24 hour  Intake 170 ml  Output 1525 ml  Net -1355 ml   Filed Weights    05/30/17 0245 05/30/17 0705 05/30/17 2142  Weight: 62.3 kg (137 lb 5.6 oz) 62.3 kg (137 lb 5.6 oz) 62.3 kg (137 lb 5.6 oz)    Examination:  General exam: NAD Respiratory system: Normal respiratory effort, Crackles bases.  Cardiovascular system: S 1, S 2 RRR Gastrointestinal system: BS present, soft, ventral hernia reducible. No rigidity  Central nervous system: alert , oriented.  Extremities: symmetric power.     Data Reviewed: I have personally reviewed following labs and imaging studies  CBC: Recent Labs  Lab 05/19/2017 1815 05/29/17 1615 05/30/17 0312  WBC 13.2* 9.9 3.3*  HGB 11.7* 11.0* 11.7*  HCT 38.2 35.7* 37.3  MCV 92.9 91.5 90.5  PLT 227 248 093   Basic Metabolic Panel: Recent Labs  Lab 05/13/2017 1815 05/30/17 0312  NA 137 133*  K 3.3* 3.4*  CL 94* 91*  CO2 30 25  GLUCOSE 148* 180*  BUN 35* 52*  CREATININE 3.56* 5.17*  CALCIUM 8.6* 8.5*  PHOS  --  5.5*   GFR: Estimated Creatinine Clearance: 7 mL/min (A) (by C-G formula based on SCr of 5.17 mg/dL (H)). Liver Function Tests: Recent Labs  Lab 05/20/2017 1815 05/30/17 0312  AST 28  --   ALT 18  --   ALKPHOS 118  --   BILITOT 0.7  --   PROT 6.8  --   ALBUMIN 2.8* 2.6*   No results for input(s): LIPASE, AMYLASE in the last 168 hours. No results for input(s): AMMONIA in the last 168 hours. Coagulation Profile: No results for input(s): INR, PROTIME in the last 168 hours. Cardiac Enzymes: No results for input(s): CKTOTAL, CKMB, CKMBINDEX, TROPONINI in the last 168 hours. BNP (last 3 results) No results for input(s): PROBNP in the last 8760 hours. HbA1C: No results for input(s): HGBA1C in the last 72 hours. CBG: Recent Labs  Lab 05/29/17 0228 05/30/17 0804 05/31/17 0757  GLUCAP 87 124* 175*   Lipid Profile: No results for input(s): CHOL, HDL, LDLCALC, TRIG, CHOLHDL, LDLDIRECT in the last 72 hours. Thyroid Function Tests: No results for input(s): TSH, T4TOTAL, FREET4, T3FREE, THYROIDAB in the  last 72 hours. Anemia Panel: No results for input(s): VITAMINB12, FOLATE, FERRITIN, TIBC, IRON, RETICCTPCT in the last 72 hours. Sepsis Labs: No results for input(s): PROCALCITON, LATICACIDVEN in the last 168 hours.  Recent Results (from the past 240 hour(s))  MRSA PCR Screening     Status: None   Collection Time: 05/29/17  2:13 AM  Result Value Ref Range Status   MRSA by PCR NEGATIVE NEGATIVE Final    Comment:        The GeneXpert MRSA Assay (FDA approved for NASAL specimens only), is one component of a comprehensive MRSA colonization surveillance program. It is not intended to diagnose MRSA infection nor to guide or monitor treatment for MRSA infections.          Radiology Studies: Dg Chest 2 View  Result Date: 05/31/2017 CLINICAL DATA:  83 year old with end-stage renal disease on hemodialysis, presenting this admission with rectal bleeding, shown to have an enterovesical fistula on CT. Follow-up right pleural effusion identified  on CT 05/19/2017. EXAM: CHEST  2 VIEW COMPARISON:  Visualized lung bases on CT abdomen and pelvis 05/29/2017. Chest x-rays 11/15/2016, 07/17/2016 and earlier. FINDINGS: Cardiac silhouette mildly to moderately enlarged, unchanged. Thoracic aorta atherosclerotic, unchanged. Hilar and mediastinal contours otherwise unremarkable. Right jugular dialysis catheter tips at the cavoatrial junction. Moderately large right pleural effusion, unchanged since the CT 3 days ago. Associated passive atelectasis in the right lower lobe and right middle lobe, unchanged. Lungs otherwise clear. Pulmonary vascularity normal without evidence of pulmonary edema. No left pleural effusion. Degenerative changes involving the thoracic and upper lumbar spine. IMPRESSION: 1. Moderately large right pleural effusion and associated passive atelectasis in the right lower lobe and right middle lobe, unchanged since the CT 3 days ago. 2. Stable cardiomegaly without evidence of pulmonary  edema. 3. No new abnormalities. Electronically Signed   By: Evangeline Dakin M.D.   On: 05/31/2017 08:10   Dg Abd Portable 1v  Result Date: 05/31/2017 CLINICAL DATA:  83 year old with acute onset nausea and vomiting. Recent diagnosis of rectovesical fistula. Known lower anterior abdominal wall ventral hernia. EXAM: PORTABLE ABDOMEN - 1 VIEW COMPARISON:  CT abdomen and pelvis 05/16/2017, 11/13/2016 and earlier. PET-CT 01/16/2016. FINDINGS: AP portable erect image was obtained. Multiple distended loops of small bowel throughout the abdomen with scattered air-fluid levels. Moderate to large stool burden throughout the normal caliber colon. Oral contrast material given at the time of the CT 2 days ago is present throughout the stool filled colon. No suggestion of free air on the supine image. Thoracolumbar dextroscoliosis and severe degenerative changes throughout the lower thoracic and lumbar spine with severe osseous demineralization. Large right pleural effusion and associated dense consolidation in the right lower lobe as noted on the CT, unchanged. IMPRESSION: 1. Partial small bowel obstruction is suspected. 2. Stable large right pleural effusion and associated dense passive atelectasis and/or pneumonia involving the right lower lobe since the CT 2 days ago. Electronically Signed   By: Evangeline Dakin M.D.   On: 05/31/2017 10:31        Scheduled Meds: . hydrocortisone  1 application Rectal BID  . midodrine  10 mg Oral BID WC   Continuous Infusions: . sodium chloride    . sodium chloride    . piperacillin-tazobactam (ZOSYN)  IV 3.375 g (05/31/17 1057)  . sodium chloride       LOS: 3 days    Time spent: 35 minutes.    Author:  Berle Mull, MD [facility] Pager: [phone] 05/31/2017 4:36 PM     If 7PM-7AM, please contact night-coverage [email or web address] Password [email or web address] 05/31/2017, 4:34 PM

## 2017-05-31 NOTE — Progress Notes (Signed)
[facility] Progress Note   Dialysis Orders: AF MWF  4h 180F 400/800 EDW 63kg 3K/2.25Ca  TDC Hep bolus 2200U Venofer 100mg  IV x10 (until 12/26) No VDRA, No ESA No binders   OP Labs: Hgb 11.0 Tsat 12% Corr Ca 9.8 P 4.3 Alb 2.8 iPTH 211   Assessment/Plan: 1. Colovesical fistula- for possible diverting colostomy later this week; on Zosyn 2. ESRD -TTS - HD tomorrow on holiday schedule K 3.4 - start on added K bath- holding heparin for now 3. Anemia - hgb 11.7  4. Secondary hyperparathyroidism - Ca 8.5 P 5.5  5. BP/volume - CXR 12/23 - mod large right pleural effusion and associated atx in RLL and RML- uncanged from CT 3 days ago - no pul edema; + 288 during HD yesterday due to low BP; if this becomes an issues - may need to tap 6. Nutrition - alb 2.6 NPO-  7. Dementia/DNR-resides at SNF/[facility]- ambulatory prior to admission 8. Nausea and vomiting - reglan and zofran not helping much  Myriam Jacobson, PA-C [facility] Beeper [phone] 05/31/2017,10:09 AM  LOS: 3 days   Subjective:   Able to walk with walker prior to admission  Objective Vitals:   05/31/17 0656 05/31/17 0700 05/31/17 0855 05/31/17 0951  BP: (!) 92/44  (!) 58/40 (!) 77/23  Pulse:  (!) 116 (!) 104 (!) 108  Resp:   18   Temp:   98 F (36.7 C)   TempSrc:   Oral   SpO2: 97%  94%   Weight:      Height:       Physical Exam General:thin elderly ill woman intermittently vomiting dark green watery emesis Heart: tachy reg Lungs: dim right base no rales Abdomen: soft ventral hernia +BS Extremities: no sig edema Dialysis Access: right IJ and right AVF   Additional Objective Labs: Basic Metabolic Panel: Recent Labs  Lab 05/31/2017 1815 05/30/17 0312  NA 137 133*  K 3.3* 3.4*  CL 94* 91*  CO2 30 25  GLUCOSE 148* 180*  BUN 35* 52*  CREATININE 3.56* 5.17*  CALCIUM 8.6* 8.5*  PHOS  --  5.5*   Liver Function Tests: Recent Labs  Lab 05/11/2017 1815 05/30/17 0312  AST  28  --   ALT 18  --   ALKPHOS 118  --   BILITOT 0.7  --   PROT 6.8  --   ALBUMIN 2.8* 2.6*   No results for input(s): LIPASE, AMYLASE in the last 168 hours. CBC: Recent Labs  Lab 06/02/2017 1815 05/29/17 1615 05/30/17 0312  WBC 13.2* 9.9 3.3*  HGB 11.7* 11.0* 11.7*  HCT 38.2 35.7* 37.3  MCV 92.9 91.5 90.5  PLT 227 248 277   Blood Culture    Component Value Date/Time   SDES FLUID RIGHT PLEURAL 11/15/2016 1153   SDES FLUID RIGHT PLEURAL 11/15/2016 1153   SPECREQUEST NONE 11/15/2016 1153   SPECREQUEST NONE 11/15/2016 1153   CULT NO GROWTH 5 DAYS 11/15/2016 1153   REPTSTATUS 11/20/2016 FINAL 11/15/2016 1153   REPTSTATUS 11/15/2016 FINAL 11/15/2016 1153    Cardiac Enzymes: No results for input(s): CKTOTAL, CKMB, CKMBINDEX, TROPONINI in the last 168 hours. CBG: Recent Labs  Lab 05/29/17 0228 05/30/17 0804 05/31/17 0757  GLUCAP 87 124* 175*   Iron Studies: No results for input(s): IRON, TIBC, TRANSFERRIN, FERRITIN in the last 72 hours. Lab Results  Component Value Date   INR 1.03 01/14/2017   INR 1.03 07/17/2016   INR 0.96 06/08/2016  Studies/Results: Dg Chest 2 View  Result Date: 05/31/2017 CLINICAL DATA:  83 year old with end-stage renal disease on hemodialysis, presenting this admission with rectal bleeding, shown to have an enterovesical fistula on CT. Follow-up right pleural effusion identified on CT 05/21/2017. EXAM: CHEST  2 VIEW COMPARISON:  Visualized lung bases on CT abdomen and pelvis 05/16/2017. Chest x-rays 11/15/2016, 07/17/2016 and earlier. FINDINGS: Cardiac silhouette mildly to moderately enlarged, unchanged. Thoracic aorta atherosclerotic, unchanged. Hilar and mediastinal contours otherwise unremarkable. Right jugular dialysis catheter tips at the cavoatrial junction. Moderately large right pleural effusion, unchanged since the CT 3 days ago. Associated passive atelectasis in the right lower lobe and right middle lobe, unchanged. Lungs otherwise clear.  Pulmonary vascularity normal without evidence of pulmonary edema. No left pleural effusion. Degenerative changes involving the thoracic and upper lumbar spine. IMPRESSION: 1. Moderately large right pleural effusion and associated passive atelectasis in the right lower lobe and right middle lobe, unchanged since the CT 3 days ago. 2. Stable cardiomegaly without evidence of pulmonary edema. 3. No new abnormalities. Electronically Signed   By: Evangeline Dakin M.D.   On: 05/31/2017 08:10   Medications: . sodium chloride    . sodium chloride    . piperacillin-tazobactam (ZOSYN)  IV Stopped (05/31/17 0204)  . sodium chloride     . donepezil  5 mg Oral QHS  . feeding supplement (PRO-STAT SUGAR FREE 64)  30 mL Oral BID  . hydrocortisone  1 application Rectal BID  . metoCLOPramide (REGLAN) injection  5 mg Intravenous Q8H  . midodrine  10 mg Oral BID WC

## 2017-05-31 NOTE — Progress Notes (Signed)
DR.Patel and Blount paged with VS, unresponsive, agonal breathing. Awaiting response.

## 2017-05-31 NOTE — Progress Notes (Signed)
Patients BP is 58/40 pulse 104. Rechecked manually- BP 58/42 pulse 106. MD notified. Orders given to give 250 ml Sodium Chloride bolus and recheck within 30 min. Rechecked BP 77/23 pulse 102. Gave patient another 250 ml Sodium Chloride bolus. Will continue to monitor.

## 2017-05-31 NOTE — Progress Notes (Signed)
[facility] Office:  [phone] General Surgery Progress Note   LOS: 3 days  POD -     Chief Complaint: Nausea, rectal bleeding  Assessment and Plan: 1.  Colovesical fistual  On Zosyn WBC - 3,300 - 05/30/2017 Possible diverting ostomy later this week  2.  Incisional hernia  Unclear how much of this is related to her nausea 3.  ERSD on hemodialysis (MWF)  Creatinine - 5.17 - 05/30/2017 4.  COPD 5.  History of endometrial cancer (hysterectomy in 2009 by Dr. Treasa School tx for recurrence 2017 6.  DM 7.  Dementia  Has DNR order Living at [facility] 8.  Cholelithiasis   Active Problems:   Rectal bleeding  Subjective:  Nauseated.  Discussed possible surgery  Objective:   Vitals:   05/31/17 0656 05/31/17 0700  BP: (!) 92/44   Pulse:  (!) 116  Resp:    Temp:    SpO2: 97%      Intake/Output from previous day:  12/22 0701 - 12/23 0700 In: 510 [P.O.:360; IV Piggyback:150] Out: 437 [Emesis/NG output:725]  Intake/Output this shift:  No intake/output data recorded.   Physical Exam:   General: Older WF who is alert and oriented.    HEENT: Normal. Pupils equal. .   Lungs: Clear   Abdomen: 12 cm mass in the LLQ - large hernia   Lab Results:    Recent Labs    05/29/17 1615 05/30/17 0312  WBC 9.9 3.3*  HGB 11.0* 11.7*  HCT 35.7* 37.3  PLT 248 277    BMET   Recent Labs    05/21/2017 1815 05/30/17 0312  NA 137 133*  K 3.3* 3.4*  CL 94* 91*  CO2 30 25  GLUCOSE 148* 180*  BUN 35* 52*  CREATININE 3.56* 5.17*  CALCIUM 8.6* 8.5*    PT/INR  No results for input(s): LABPROT, INR in the last 72 hours.  ABG  No results for input(s): PHART, HCO3 in the last 72 hours.  Invalid input(s): PCO2, PO2   Studies/Results:  Dg Chest 2 View  Result Date: 05/31/2017 CLINICAL DATA:  83 year old with end-stage renal disease on hemodialysis, presenting this admission with rectal bleeding, shown to have an enterovesical fistula on CT. Follow-up  right pleural effusion identified on CT 05/15/2017. EXAM: CHEST  2 VIEW COMPARISON:  Visualized lung bases on CT abdomen and pelvis 05/27/2017. Chest x-rays 11/15/2016, 07/17/2016 and earlier. FINDINGS: Cardiac silhouette mildly to moderately enlarged, unchanged. Thoracic aorta atherosclerotic, unchanged. Hilar and mediastinal contours otherwise unremarkable. Right jugular dialysis catheter tips at the cavoatrial junction. Moderately large right pleural effusion, unchanged since the CT 3 days ago. Associated passive atelectasis in the right lower lobe and right middle lobe, unchanged. Lungs otherwise clear. Pulmonary vascularity normal without evidence of pulmonary edema. No left pleural effusion. Degenerative changes involving the thoracic and upper lumbar spine. IMPRESSION: 1. Moderately large right pleural effusion and associated passive atelectasis in the right lower lobe and right middle lobe, unchanged since the CT 3 days ago. 2. Stable cardiomegaly without evidence of pulmonary edema. 3. No new abnormalities. Electronically Signed   By: Evangeline Dakin M.D.   On: 05/31/2017 08:10     Anti-infectives:   Anti-infectives (From admission, onward)   Start     Dose/Rate Route Frequency Ordered Stop   05/29/17 1230  piperacillin-tazobactam (ZOSYN) IVPB 3.375 g     3.375 g 12.5 mL/hr over 240 Minutes Intravenous Every 12 hours 05/29/17 1157        Alphonsa Overall,  MD, FACS Pager: [facility] Office: [phone] 05/31/2017

## 2017-05-31 NOTE — Progress Notes (Signed)
Erlene Quan and Vida Rigger called informed that patient is not doing well and they should come to the hospital. Arthor Captain LPN

## 2017-05-31 NOTE — Progress Notes (Signed)
CRITICAL VALUE ALERT  Critical Value:  Lactic Acid 10.5  Date & Time Notied:  05/31/17 1827  Provider Notified: Posey Pronto  Orders Received/Actions taken: MD notified.

## 2017-06-01 DIAGNOSIS — N186 End stage renal disease: Secondary | ICD-10-CM

## 2017-06-01 DIAGNOSIS — Z992 Dependence on renal dialysis: Secondary | ICD-10-CM

## 2017-06-01 DIAGNOSIS — N321 Vesicointestinal fistula: Secondary | ICD-10-CM | POA: Diagnosis present

## 2017-06-01 DIAGNOSIS — K439 Ventral hernia without obstruction or gangrene: Secondary | ICD-10-CM | POA: Diagnosis present

## 2017-06-01 DIAGNOSIS — K551 Chronic vascular disorders of intestine: Secondary | ICD-10-CM | POA: Diagnosis present

## 2017-06-01 DIAGNOSIS — I700 Atherosclerosis of aorta: Secondary | ICD-10-CM | POA: Diagnosis present

## 2017-06-09 NOTE — Discharge Summary (Addendum)
[facility] Death Summary   Patient: Debbie [name] WUX:[id number]   PCP: Patient, No Pcp Per DOB: 1930/11/29   Date of admission: 2017-06-04   Date and time of death: 06/08/2017 1:55 AM   Hospital Diagnoses:  Principal Problem:   Rectal bleeding Active Problems:   Carotid stenosis   Anxiety   Hypertension   Diabetes mellitus with complication ([place])   COPD with hypoxia (HCC)   Diastolic heart failure (HCC)   Atrial fibrillation (HCC)   Hypotension   Pleural effusion   Diverticulosis of colon with hemorrhage   ESRD (end stage renal disease) on dialysis ([place])   Enterovesical fistula   Aortic atherosclerosis ([place])   Arteriosclerosis, mesenteric artery ([place])   Ventral hernia  History of present illness: As per the H and P dictated on admission, " Debbie [name] is a 84 y.o. female with medical history significant of rectal bleeding likely from diverticular bleed, endometrial cancer status post radiation, ESRD on hemodialysis Monday Wednesday Friday, essential hypertension came to the ER for evaluation of rectal bleed.  Patient resides at [facility] assisted [facility] where she was noted to have one episode of rectal bleed earlier today therefore sent to the ER for evaluation.  Patient denies any abdominal pain, nausea, vomiting any other complaints.  She admits that she gets these diverticular rectal bleed frequently and has been evaluated extensively in the past. She was here back in August 2018 for the similar reason and underwent flex sigmoidoscopy which showed likely a diverticular bleed. In the ER today she was hemodynamically stable with hemoglobin around 11 which is her baseline.  Her rectal exam was negative and no blood found in her rectal vault.  Rest of the labs were otherwise mostly unremarkable.  While the nurse was obtaining her urine via straight cath she noted obvious stool coming out of her urethra.  CT of the abdomen pelvis done which showed enterovesical fistula,  colonic diverticulosis, sizable right-sided pleural effusion, abdominal wall anasarca and extensive aortic atherosclerosis.  Case was discussed with on-call general surgery, Dr Barry Dienes who recommended admitting the patient and consulting urology in the morning for further care."  Hospital Course:  Patient with past medical history of ESRD on hemodialysis, chronic hypotension managed with midodrine, endometrial cancer S/P radiation therapy, COPD, type II DM, dementia, chronic diastolic CHF, paroxysmal A. fib not on anticoagulation due to GI bleed, peripheral vascular disease S/P carotid endarterectomy. Patient had hematochezia in August 2018, underwent sigmoidoscopy and had diverticular bleeding.  This time, initially presented to the hospital with complaints of rectal bleeding.  Further workup was performed in the ER.  CT abdomen was performed which showed enterovesical fistula, large ventral hernia with bowel loops, moderate right-sided pleural effusion, abdominal wall anasarca and extensive aortic atherosclerosis as well as mesenteric and common iliac atherosclerosis. General surgery was consulted due to colovesical fistula who in turn recommended urology consult.  In and out catheter per the staff reportedly demonstrated obvious stool in the urine.   Urology felt that patient is not likely candidate for definitive surgical treatment of her fistula due to her frailty, multiple medical comorbidities as well as DNR status.  Recommended diverting colostomy and drainage of urinary tract with a Foley catheter versus PCN.   Post HD remained hypotensive.  Judicious fluid bolus were given due to her HD status, with improvement in BP.  Patient started nausea and vomiting.  X-ray abdomen was showing partial SBO, no free air.  NG tube was placed with significant decompression  of 1liter of gastric content.  Later in the evening patient was found lethargic, hypotensive.  Lactic acid levels were 10.  With patient,  already considered not a candidate for aggressive surgery earlier by both general surgery as well as urology, family decided to opt for comfort measures.  Patient's care wasl transition to comfort care and family was informed at bedside by on-call provider.  Rectal bleeding likely from diverticular bleed -Sigmoidoscopy back in August 2018 showed sigmoid diverticular bleed. -Hemoglobin remained stable at this time.  Enterovesical fistula -Likely a complication of radiation treatment for endometrial cancer -General surgery and urology were consulted. -Not a candidate for corrective surgery, recommended diverting colostomy. -Started on IV Zosyn since the day of admission.  Nausea , vomiting. Partial SBO on x-ray Frequent recurrent vomiting seen throughout the day today.  Patient was initially given scheduled Reglan along with Phenergan as needed. Since that did not stop patient some vomiting, patient was kept n.p.o. Inserted NG tube.  ESRD on hemodialysis, MWF Acute on chronic diastolic CHF -Has a graft and a fistula which has not matured yet-right upper arm -Patient received regular HD in the hospital. - Pleural effusion did not show any improvement post HD.  Pleural Effusion, right Mild Ascites  -Fluid overload as a dialysis patient.   -Was mildly hypoxic, pleural effusion did not show any improvement post HD.  Anemia of chronic disease -This is likely from her being on dialysis.  Hemoglobin stable  Metabolic bone disease - Not on binders at this time  History of paroxysmal A. Fib PVD, S/P bilateral carotid endarterectomy 03/2014 Extensive aortic, iliac and mesenteric atherosclerotic disease - Not a candidate for anticoagulation due to rectal bleed.  Recurrent endometrial adenocarcinoma status post XRT finished XRT with Dr. Sondra Come on 05/22/16.  History of COPD  -Stable. Apparently on home oxygen. Continue home medications.  Chronic hypotension. Initially likely from  volume removal given IV fluids.  Later on from shock.  Patient transition to complete comfort due to her multiple comorbidities.  The patient was pronounced deceased at 1:55 AM, on 2017/06/13.  Procedures and Results:  HD   Consultations:  General surgery  Nephrology  Urology.  The results of significant diagnostics from this hospitalization (including imaging, microbiology, ancillary and laboratory) are listed below for reference.    Significant Diagnostic Studies: Ct Abdomen Pelvis Wo Contrast  Result Date: 05/31/2017 CLINICAL DATA:  Intermittent blood in stool. History of endometrial carcinoma. Chronic renal failure EXAM: CT ABDOMEN AND PELVIS WITHOUT CONTRAST TECHNIQUE: Multidetector CT imaging of the abdomen and pelvis was performed following the standard protocol without IV contrast. Oral contrast was administered. COMPARISON:  November 13, 2016 FINDINGS: Lower chest: There is a sizable right pleural effusion with consolidation in the right lower lobe. There is a minimal left pleural effusion. There are multiple foci of coronary artery calcification. Hepatobiliary: No focal liver lesions are appreciable on this noncontrast enhanced study. There are gallstones within the gallbladder. The gallbladder wall does thickened. There is no biliary duct dilatation. Pancreas: There is no pancreatic mass or inflammatory focus. Spleen: No splenic lesions are evident. Adrenals/Urinary Tract: Adrenals bilaterally appear unremarkable. There is moderate perinephric stranding and fluid surrounding both kidneys. There is a 1.2 x 1.0 cm cyst arising from the anterior mid left kidney. There is no appreciable hydronephrosis on either side. There is no renal or ureteral calculus on either side. There is diffuse urinary bladder wall thickening with extensive air in the bladder. Note that a fat plane between the  sigmoid colon in the urinary bladder cannot be ascertained. This appearance raises concern for  enterovesical fistula. Stomach/Bowel: There is wall thickening throughout the sigmoid colon. There are multiple diverticula without frank diverticulitis. Note that the sigmoid colon cannot be separated from the urinary bladder, raising concern for potential enterovesical fistula. There is a sizable ventral hernia. Loops of small bowel extends into this hernia without bowel compromise. A small amount of ascites is present in this area. Elsewhere, there is no appreciable bowel wall or mesenteric thickening. There are diverticula in the descending colon without diverticulitis. No evident bowel obstruction. No free air or portal venous air. Vascular/Lymphatic: There is extensive aortoiliac atherosclerosis. There appears to be hemodynamically significant obstruction in the proximal iliac arteries bilaterally, more severe on the right than on the left. There is calcification in multiple pelvic arterial vessels. There is also calcification in the mesenteric vessels. There appears to be high-grade stenosis in all the proximal major mesenteric vessels without frank obstruction. There is extensive calcification in the hepatic artery extending into the proximal liver. No adenopathy is appreciable in the abdomen or pelvis. Reproductive: Uterus absent.  No well-defined pelvic mass evident. Other: Appendix absent. There is mild ascites in the abdomen, primarily adjacent to the liver and within the ventral hernia repair no abscess is seen in the abdomen or pelvis. There is a degree of abdominal wall anasarca. Musculoskeletal: There is extensive degenerative change in the lumbar spine. There are no blastic or lytic bone lesions. No intramuscular lesion evident. IMPRESSION: 1. A fat plane cannot be established between the sigmoid colon and the urinary bladder. There is air in the urinary bladder. Question enterovesical fistula. 2. Sizable ventral hernia containing loops of small bowel without bowel compromise. No bowel obstruction  evident. 3. Left-sided colonic diverticulosis. Wall thickening in the sigmoid colon is likely due to muscular hypertrophy from chronic diverticulosis. No frank diverticulitis appreciable. 4. Sizable right pleural effusion with right base consolidation. Minimal left pleural effusion. 5.  There is a degree of anasarca in the abdominal wall. 6. Extensive aortic atherosclerosis. There is hemodynamically significant obstruction due to calcification in both common iliac arteries. There is extensive mesenteric arterial atherosclerosis with what appears to be high-grade stenosis in the major mesenteric arteries. There is no frank bowel ischemia evident on this study. There is also extensive coronary artery calcification. 7.  Cholelithiasis. 8.  Mild ascites. 9.  Uterus absent.  Appendix absent. Aortic Atherosclerosis (ICD10-I70.0). Electronically Signed   By: Lowella Grip III M.D.   On: 06/07/2017 22:35   Dg Chest 2 View  Result Date: 05/31/2017 CLINICAL DATA:  84 year old with end-stage renal disease on hemodialysis, presenting this admission with rectal bleeding, shown to have an enterovesical fistula on CT. Follow-up right pleural effusion identified on CT 05/13/2017. EXAM: CHEST  2 VIEW COMPARISON:  Visualized lung bases on CT abdomen and pelvis 05/22/2017. Chest x-rays 11/15/2016, 07/17/2016 and earlier. FINDINGS: Cardiac silhouette mildly to moderately enlarged, unchanged. Thoracic aorta atherosclerotic, unchanged. Hilar and mediastinal contours otherwise unremarkable. Right jugular dialysis catheter tips at the cavoatrial junction. Moderately large right pleural effusion, unchanged since the CT 3 days ago. Associated passive atelectasis in the right lower lobe and right middle lobe, unchanged. Lungs otherwise clear. Pulmonary vascularity normal without evidence of pulmonary edema. No left pleural effusion. Degenerative changes involving the thoracic and upper lumbar spine. IMPRESSION: 1. Moderately large  right pleural effusion and associated passive atelectasis in the right lower lobe and right middle lobe, unchanged since the CT 3  days ago. 2. Stable cardiomegaly without evidence of pulmonary edema. 3. No new abnormalities. Electronically Signed   By: Evangeline Dakin M.D.   On: 05/31/2017 08:10   Dg Abd Portable 1v  Result Date: 05/31/2017 CLINICAL DATA:  NG tube placement. EXAM: PORTABLE ABDOMEN - 1 VIEW COMPARISON:  05/31/2017 earlier FINDINGS: There has been placement of a nasogastric tube with tip and side-port over the stomach in the left upper quadrant. Persistent air-filled dilated small bowel loops are present over the left lower quadrant. Air and stool present throughout the colon. Remainder of the exam is unchanged. IMPRESSION: Persistent air-filled dilated small bowel loops in the left abdomen. Nasogastric tube with tip and side-port over the stomach in the left upper quadrant. Electronically Signed   By: Marin Olp M.D.   On: 05/31/2017 18:29   Dg Abd Portable 1v  Result Date: 05/31/2017 CLINICAL DATA:  84 year old with acute onset nausea and vomiting. Recent diagnosis of rectovesical fistula. Known lower anterior abdominal wall ventral hernia. EXAM: PORTABLE ABDOMEN - 1 VIEW COMPARISON:  CT abdomen and pelvis 05/20/2017, 11/13/2016 and earlier. PET-CT 01/16/2016. FINDINGS: AP portable erect image was obtained. Multiple distended loops of small bowel throughout the abdomen with scattered air-fluid levels. Moderate to large stool burden throughout the normal caliber colon. Oral contrast material given at the time of the CT 2 days ago is present throughout the stool filled colon. No suggestion of free air on the supine image. Thoracolumbar dextroscoliosis and severe degenerative changes throughout the lower thoracic and lumbar spine with severe osseous demineralization. Large right pleural effusion and associated dense consolidation in the right lower lobe as noted on the CT, unchanged.  IMPRESSION: 1. Partial small bowel obstruction is suspected. 2. Stable large right pleural effusion and associated dense passive atelectasis and/or pneumonia involving the right lower lobe since the CT 2 days ago. Electronically Signed   By: Evangeline Dakin M.D.   On: 05/31/2017 10:31    Microbiology: Recent Results (from the past 240 hour(s))  MRSA PCR Screening     Status: None   Collection Time: 05/29/17  2:13 AM  Result Value Ref Range Status   MRSA by PCR NEGATIVE NEGATIVE Final    Comment:        The GeneXpert MRSA Assay (FDA approved for NASAL specimens only), is one component of a comprehensive MRSA colonization surveillance program. It is not intended to diagnose MRSA infection nor to guide or monitor treatment for MRSA infections.      Labs: CBC: Recent Labs  Lab 05/25/2017 1815 05/29/17 1615 05/30/17 0312 05/31/17 1641  WBC 13.2* 9.9 3.3* 12.4*  NEUTROABS  --   --   --  11.3*  HGB 11.7* 11.0* 11.7* 12.9  HCT 38.2 35.7* 37.3 41.8  MCV 92.9 91.5 90.5 92.9  PLT 227 248 277 720   Basic Metabolic Panel: Recent Labs  Lab 05/31/2017 1815 05/30/17 0312 05/31/17 1641  NA 137 133* 141  K 3.3* 3.4* 3.9  CL 94* 91* 89*  CO2 30 25 22   GLUCOSE 148* 180* 88  BUN 35* 52* 39*  CREATININE 3.56* 5.17* 4.69*  CALCIUM 8.6* 8.5* 9.1  PHOS  --  5.5*  --    Liver Function Tests: Recent Labs  Lab 05/16/2017 1815 05/30/17 0312  AST 28  --   ALT 18  --   ALKPHOS 118  --   BILITOT 0.7  --   PROT 6.8  --   ALBUMIN 2.8* 2.6*   No results for  input(s): LIPASE, AMYLASE in the last 168 hours. No results for input(s): AMMONIA in the last 168 hours. Cardiac Enzymes: No results for input(s): CKTOTAL, CKMB, CKMBINDEX, TROPONINI in the last 168 hours. BNP (last 3 results) Recent Labs    06/08/16 0751  BNP 690.8*   CBG: Recent Labs  Lab 05/29/17 0228 05/30/17 0804 05/31/17 0757 05/31/17 2101 05/31/17 2128  GLUCAP 87 124* 175* 23* 203*   Time spent: 30  minutes  Signed:  Berle Mull  [facility] 07/01/2017, 4:38 PM

## 2017-06-09 NOTE — Progress Notes (Signed)
[facility] paged that patient expired at [place]. Arthor Captain LPN

## 2017-06-09 DEATH — deceased

## 2017-09-03 ENCOUNTER — Ambulatory Visit: Payer: Medicare HMO | Admitting: Family

## 2017-09-03 ENCOUNTER — Encounter (HOSPITAL_COMMUNITY): Payer: Medicare HMO

## 2018-06-03 IMAGING — CT NM PET TUM IMG INITIAL (PI) SKULL BASE T - THIGH
1 of 8 series · 1 of 25 positions shown · non-contrast
Comparison: CT abdomen pelvis 12/17/2011.

CLINICAL DATA: Initial treatment strategy for vaginal lesion.

EXAM:
NUCLEAR MEDICINE PET SKULL BASE TO THIGH
TECHNIQUE: 9.7 mCi F-18 FDG was injected intravenously. Full-ring PET imaging
was performed from the skull base to thigh after the radiotracer. CT
data was obtained and used for attenuation correction and anatomic
localization.
FASTING BLOOD GLUCOSE:  Value: 146 mg/dl

[Series 4: ct sk_thigh 5.0 b31f · axial · 5.0mm · 0.98mm/px · 1 of 205 slices shown]
[im 205/205  brain]
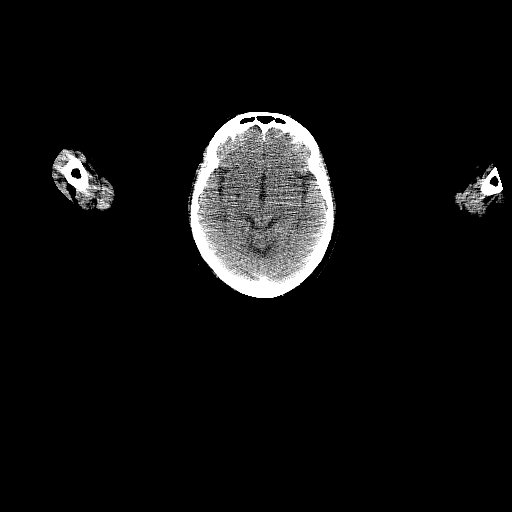

[1 of 25 positions shown; findings below may reference images not displayed]

FINDINGS: NECK

No hypermetabolic lymph nodes in the neck. CT images show no acute
findings.

CHEST

No hypermetabolic mediastinal, hilar or axillary lymph nodes. No
hypermetabolic pulmonary nodules. Aortic and three-vessel coronary
artery calcification. Heart is at the upper limits of normal in size
to mildly enlarged. No pericardial effusion. Small simple appearing
right pleural effusion. 4 mm nodule along the minor fissure, likely
a subpleural lymph node, too small for PET resolution. Thyroid is
heterogeneous and asymmetrically enlarged on the left.

ABDOMEN/PELVIS

There is abnormal hypermetabolism along the left vaginal wall, with
an SUV max of 34.9, corresponding to a mass measuring approximately
2.5 x 3.2 cm. No adjacent hypermetabolic adenopathy. There is a a
minimally hypermetabolic left inguinal lymph node, measuring 10 mm
(CT image 178) with an SUV max of 3.9. It is smaller than on
12/17/2011, at which time it measured 1.6 cm. No abnormal
hypermetabolism in the liver, adrenal glands, spleen or pancreas. No
additional hypermetabolic lymph nodes. Liver is unremarkable. Small
stones are seen in the gallbladder. Adrenal glands are unremarkable.
Probable renal vascular calcifications bilaterally. Difficult to
exclude tiny stones in the right kidney. Low-attenuation lesions in
the kidneys measure up to 1.5 cm on the left and are difficult to
further characterize due to size and/or lack of IV contrast.
Hyperdense lesions measure up to 1.5 cm, also difficult to further
characterize. Stomach and bowel are grossly unremarkable. Upper
midline ventral abdominal hernia contains fat, small. Supraumbilical
left para midline ventral hernia could is small and contains fat. A
larger left para midline hernia is seen along the ventral pelvic
wall and contains unobstructed bowel. Atherosclerotic calcification
of the arterial vasculature without abdominal aortic aneurysm.

SKELETON

No abnormal osseous hypermetabolism.
IMPRESSION: 1. Hypermetabolic vaginal mass without evidence of metastatic
disease.
2. Mildly hypermetabolic left inguinal lymph node, smaller in size
than on 12/17/2011, nonspecific.
3. Small right pleural effusion.
4. Aortic atherosclerosis and three-vessel coronary artery
calcification.
5. Cholelithiasis.
6. Difficult to exclude tiny right renal stones.
7. Ventral hernias, as discussed above.

## 2018-10-18 IMAGING — CR DG CHEST 1V PORT
1 series · 1 of 1 positions shown · non-contrast
Comparison: 05/29/2016

CLINICAL DATA: Short of breath. Diabetes. Endometrial cancer.
Confusion.

EXAM:
PORTABLE CHEST 1 VIEW

[AP]
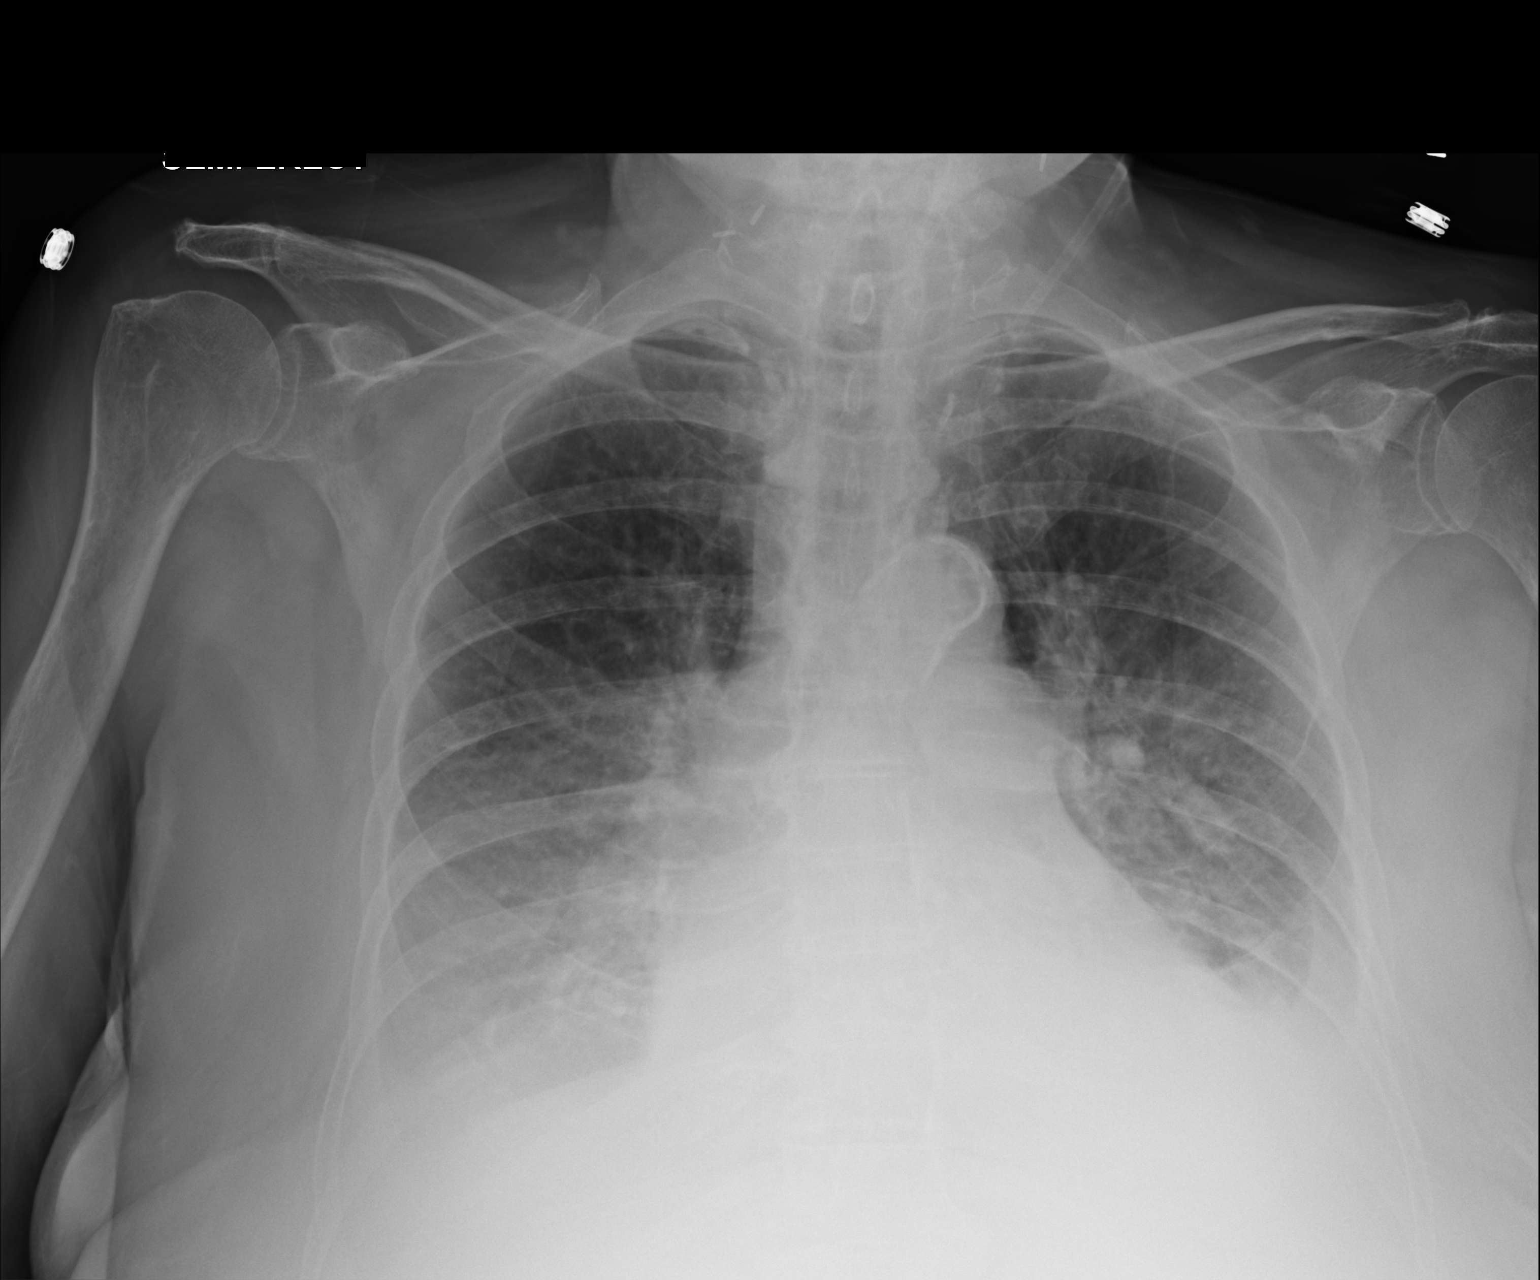

[1 of 1 positions shown; findings below may reference images not displayed]

FINDINGS: Midline trachea. Surgical clips in the neck. Cardiomegaly
accentuated by AP portable technique. Atherosclerosis in the
transverse aorta. Bilateral pleural effusions are similar. No
pneumothorax. Mild interstitial edema, accentuated by low lung
volumes. Persistent bibasilar airspace disease.
IMPRESSION: No significant change since the prior exam.

Congestive heart failure with bilateral pleural effusions and
adjacent airspace disease, most likely atelectasis.

## 2018-10-25 IMAGING — DX DG CHEST 1V PORT
1 series · 1 of 1 positions shown · non-contrast
Comparison: 06/01/2016 and earlier.

CLINICAL DATA: 85-year-old female with acute onset of severe
shortness of Breath. Recent unrelenting illness, recently
hospitalized. Initial encounter.

EXAM:
PORTABLE CHEST 1 VIEW

[chest ap]
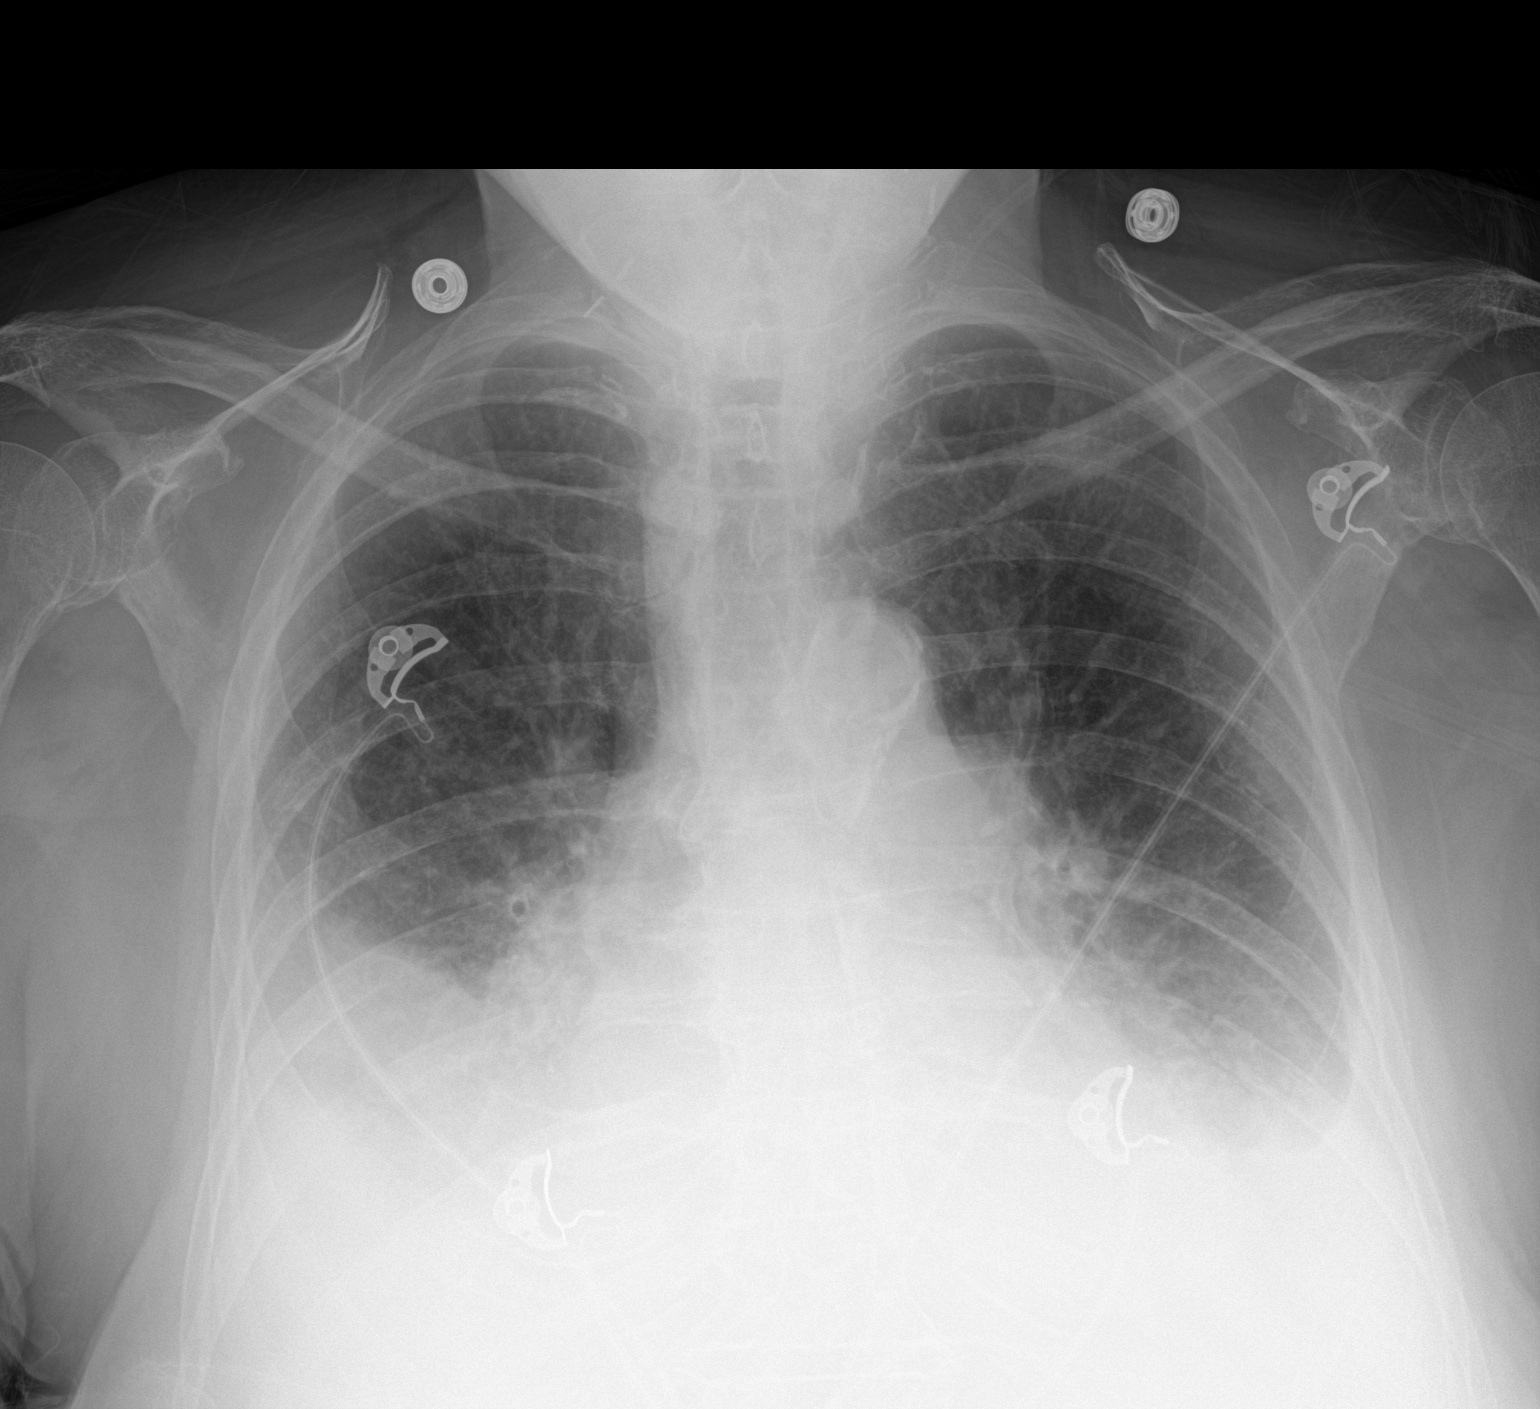

[1 of 1 positions shown; findings below may reference images not displayed]

FINDINGS: Portable AP semi upright view at 1980 hours. Continued small to
moderate bilateral pleural effusions with dense bibasilar
opacification. Stable cardiomegaly and mediastinal contours.
Calcified aortic atherosclerosis. No pneumothorax. Pulmonary
vascular congestion without overt edema. Stable bilateral neck
surgical clips such as due to prior carotid surgery. No acute
osseous abnormality identified.
IMPRESSION: Up to moderate bilateral pleural effusions with associated bibasilar
collapse or consolidation. No acute edema.

Chronic cardiomegaly and Calcified aortic atherosclerosis.
